# Patient Record
Sex: Male | Born: 1965 | Race: Black or African American | Hispanic: No | Marital: Single | Smoking: Current every day smoker
Health system: Southern US, Community
[De-identification: ages and names within clinical notes are randomized; demographics above are authoritative.]

## PROBLEM LIST (undated history)

## (undated) ENCOUNTER — Emergency Department (HOSPITAL_COMMUNITY): Admission: EM | Payer: Medicaid Other | Source: Home / Self Care

## (undated) DIAGNOSIS — K219 Gastro-esophageal reflux disease without esophagitis: Secondary | ICD-10-CM

## (undated) DIAGNOSIS — E119 Type 2 diabetes mellitus without complications: Secondary | ICD-10-CM

## (undated) DIAGNOSIS — E785 Hyperlipidemia, unspecified: Secondary | ICD-10-CM

## (undated) DIAGNOSIS — I10 Essential (primary) hypertension: Secondary | ICD-10-CM

## (undated) HISTORY — DX: Hyperlipidemia, unspecified: E78.5

## (undated) HISTORY — PX: HIP SURGERY: SHX245

## (undated) HISTORY — DX: Essential (primary) hypertension: I10

## (undated) HISTORY — PX: WRIST SURGERY: SHX841

## (undated) HISTORY — PX: TOOTH EXTRACTION: SUR596

---

## 2000-11-06 HISTORY — PX: ESOPHAGOGASTRODUODENOSCOPY: SHX1529

## 2001-02-18 ENCOUNTER — Emergency Department (HOSPITAL_COMMUNITY): Admission: EM | Admit: 2001-02-18 | Discharge: 2001-02-18 | Payer: Self-pay | Admitting: *Deleted

## 2001-09-08 ENCOUNTER — Emergency Department (HOSPITAL_COMMUNITY): Admission: EM | Admit: 2001-09-08 | Discharge: 2001-09-08 | Payer: Self-pay | Admitting: Emergency Medicine

## 2001-10-21 ENCOUNTER — Inpatient Hospital Stay (HOSPITAL_COMMUNITY): Admission: EM | Admit: 2001-10-21 | Discharge: 2001-10-22 | Payer: Self-pay | Admitting: Emergency Medicine

## 2002-11-12 ENCOUNTER — Encounter: Payer: Self-pay | Admitting: Emergency Medicine

## 2002-11-12 ENCOUNTER — Emergency Department (HOSPITAL_COMMUNITY): Admission: EM | Admit: 2002-11-12 | Discharge: 2002-11-12 | Payer: Self-pay | Admitting: Emergency Medicine

## 2003-01-04 ENCOUNTER — Emergency Department (HOSPITAL_COMMUNITY): Admission: EM | Admit: 2003-01-04 | Discharge: 2003-01-04 | Payer: Self-pay | Admitting: Internal Medicine

## 2003-05-07 ENCOUNTER — Emergency Department (HOSPITAL_COMMUNITY): Admission: EM | Admit: 2003-05-07 | Discharge: 2003-05-07 | Payer: Self-pay | Admitting: *Deleted

## 2004-03-30 ENCOUNTER — Emergency Department (HOSPITAL_COMMUNITY): Admission: EM | Admit: 2004-03-30 | Discharge: 2004-03-31 | Payer: Self-pay | Admitting: *Deleted

## 2004-08-23 ENCOUNTER — Emergency Department (HOSPITAL_COMMUNITY): Admission: EM | Admit: 2004-08-23 | Discharge: 2004-08-23 | Payer: Self-pay

## 2005-01-16 ENCOUNTER — Emergency Department (HOSPITAL_COMMUNITY): Admission: EM | Admit: 2005-01-16 | Discharge: 2005-01-16 | Payer: Self-pay | Admitting: *Deleted

## 2005-06-28 ENCOUNTER — Inpatient Hospital Stay (HOSPITAL_COMMUNITY): Admission: EM | Admit: 2005-06-28 | Discharge: 2005-06-29 | Payer: Self-pay | Admitting: Emergency Medicine

## 2005-07-06 ENCOUNTER — Emergency Department (HOSPITAL_COMMUNITY): Admission: EM | Admit: 2005-07-06 | Discharge: 2005-07-06 | Payer: Self-pay | Admitting: *Deleted

## 2006-03-09 ENCOUNTER — Emergency Department (HOSPITAL_COMMUNITY): Admission: EM | Admit: 2006-03-09 | Discharge: 2006-03-09 | Payer: Self-pay | Admitting: Emergency Medicine

## 2011-03-24 NOTE — Op Note (Signed)
[facility]  Patient:    David Knox, David Knox Visit Number: [id number] MRN: [id number]          Service Type: MED Location: 2A A216 01 Attending Physician:  Herbert Seta Dictated by:   Roetta Sessions, M.D. Proc. Date: 10/21/01 Admit Date:  10/20/2001   CC:         Kari Baars, M.D.   Operative Report  PROCEDURE:  Diagnostic esophagogastroduodenoscopy.  ENDOSCOPIST:  Roetta Sessions, M.D.  INDICATION FOR PROCEDURE:  Patient is a 45 year old gentleman being admitted to the hospital with nausea and hematemesis.  Esophagogastroduodenoscopy is now being done to further evaluate his symptoms. This approach has been discussed with David Knox previously.  Potential risks, benefits and alternatives have been reviewed and questions answered; he is agreeable.  Please see the documentation in the chart.  I feel he is low risk for conscious sedation.  DESCRIPTION OF PROCEDURE:  Patient was placed in the left lateral decubitus position.  O2 saturation, blood pressure, pulse and respirations were monitored throughout the entire procedure.  Conscious sedation:  Versed 3 mg IV in divided doses, Demerol 75 mg IV in divided doses.  INSTRUMENT:  Olympus video chip diagnostic gastroscope.  FINDINGS:  Examination of the tubular esophagus revealed three long columns of linear deep erosions extending from the EG junction up involving the distal one-third of the tubular esophagus.  There were superimposed ulcers distally on these erosions.  The EG junction was quite patulous.  There were no esophageal varices and no Barretts esophagus.  EG junction was easily traversed.  Stomach:  The gastric cavity was emptied and insufflated well with air. Thorough examination of the gastric mucosa including a retroflexed view of the proximal stomach and esophagogastric junction demonstrated only a moderate-sized hiatal hernia.  Pyloric channel was patent and  easily traversed.  Duodenum:  Bulb and second portion appeared normal.  THERAPEUTIC/DIAGNOSTIC MANEUVERS PERFORMED:  None.  The patient tolerated the procedure well and was reactive at endoscopy.  IMPRESSION: 1. Multiple deep, long, linear erosions with superimposed ulcerations    consistent with moderately severe erosive/ulcerative reflux esophagitis. 2. Patulous esophagogastric junction. 3. Moderate-sized hiatal hernia. 4. Remainder of upper gastrointestinal tract appeared normal. 5. I suspect the patient has bled from esophagitis.  RECOMMENDATIONS: 1. Agree with Protonix.  Would continue him on that medication indefinitely. 2. Hopefully, he can stop drinking and may not need aggressive acid    suppression in the future but for the time-being, he will need to be    treated aggressively. 3. Carafate 1 g slurries orally q.i.d. x 5 days. 4. Low-fat diet. 5. Follow up on pending amylase and lipase. Dictated by:   Roetta Sessions, M.D. Attending Physician:  Herbert Seta DD:  10/21/01 TD:  10/21/01 Job: [id number]

## 2011-03-24 NOTE — Discharge Summary (Signed)
[facility]  Patient:    David Knox, David Knox Visit Number: [id number] MRN: [id number]          Service Type: MED Location: 2A A216 01 Attending Physician:  Fredirick Maudlin Dictated by:   Kari Baars, M.D. Admit Date:  10/20/2001 Discharge Date: 10/22/2001                             Discharge Summary  FINAL DISCHARGE DIAGNOSES: 1. Erosive reflux esophagitis. 2. Gastrointestinal bleeding secondary to #1.  BRIEF HISTORY:  This is a 45 year old who had been in his usual state of fairly good health at home when he developed abdominal pain, then he vomited, then he vomited up blood.  When he came to the emergency room he was noted to have hematemesis.  He had drank some alcohol prior to admission and his alcohol level was slightly elevated also.  PHYSICAL EXAMINATION:  GENERAL:  Showed that he was a well-developed well-nourished male, who did not appear to be in any acute distress.  CHEST:  His chest was fairly clear.  HEART:  Regular.  ABDOMEN:  Soft without masses.  EXTREMITIES:  No edema.  CNS:  Grossly intact.  HOSPITAL COURSE:  He had serial hemoglobins and hematocrits but did not have a significant drop and therefore required no blood.  He had GI consultation and underwent EGD which showed erosive esophagitis which was felt to be the cause of his bleeding.  He was treated with protonix and improved and said that he felt well by the time of discharge and was discharged home in improved condition to take Carafate liquid 4 times a day and a protonix 40 mg daily.  A multiple vitamin with iron daily and then follow up in my office in about 6 weeks. Dictated by:   Kari Baars, M.D. Attending Physician:  Fredirick Maudlin DD:  11/11/01 TD:  11/12/01 Job: [id number]

## 2011-03-24 NOTE — Group Therapy Note (Signed)
[facility]  Patient:    David Knox, David Knox Visit Number: [id number] MRN: [id number]          Service Type: MED Location: 2A A216 01 Attending Physician:  Fredirick Maudlin Dictated by:   Kari Baars, M.D. Proc. Date: 10/22/01 Admit Date:  10/20/2001 Discharge Date: 10/22/2001                               Progress Note  PROBLEM 1. Abdominal pain. 2. Gastrointestinal bleeding.  SUBJECTIVE:  David Knox says he feels well and has no complaints.  He has had no further nausea or vomiting.  OBJECTIVE:  His physical exam shows that his abdomen is soft.  Vital signs: Blood pressure 120/70.  LABORATORY DATA:  The hemoglobin level is about 12.3.  Amylase and lipase are both normal.  ASSESSMENT:  He is much improved.  PLAN:  For discharge today.  Please see the discharge summary for details. Dictated by:   Kari Baars, M.D. Attending Physician:  Fredirick Maudlin DD:  10/22/01 TD:  10/22/01 Job: [id number]
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
## 2011-03-24 NOTE — H&P (Signed)
[facility]  Patient:    David Knox, David Knox Visit Number: [id number] MRN: [id number]          Service Type: MED Location: 2A A216 01 Attending Physician:  Herbert Seta Dictated by:   Kari Baars, M.D. Admit Date:  10/20/2001                           History and Physical  PROBLEM:  GI bleeding.  SUBJECTIVE:  David Knox is a 45 year old who came to the emergency room after having had abdominal pain and vomiting.  He started vomiting up blood.  He came to the emergency room after that because of concerns about the vomiting of blood.  He was treated in the emergency room and found what appeared to be ______, but because of the hematemesis was admitted to the hospital for further evaluation.  His hemoglobin level has dropped this morning from 14.9 to about 13.4.  He had consumed alcohol prior to coming in and his alcohol level was 0.9.  His comprehensive metabolic profile showed normal liver enzymes.  He says that he has had previous episodes of some abdominal discomfort.  He has vomited in the past with some blood.  He has been having a lot of heartburn recently.  PAST MEDICAL HISTORY:  Otherwise essentially negative.  MEDICATIONS:  None.  PAST SURGICAL HISTORY:  None.  SOCIAL HISTORY:  He drinks alcohol on occasion.  He lives at home with his wife.  REVIEW OF SYSTEMS:  Except as mentioned.  He has not noticed any black or discolored stools.  He has not had any diarrhea.  He has had no other symptoms.  No weight loss.  PHYSICAL EXAMINATION  GENERAL:  Well-developed, well-nourished who does not appear to be in any acute distress now.  He says he is thirsty.  VITAL SIGNS:  Blood pressure 120/82, pulse 80 and regular, respirations 16.  HEENT:  Pupils are equal, round and reactive to light and accommodation.  Nose and throat are clear.  NECK:  Supple without masses.  CHEST:  Clear without wheezes, rales, or rhonchi.  HEART:   Regular without murmur, gallop, or rubs.  ABDOMEN:  Soft.  No tenderness.  EXTREMITIES:  No edema.  CNS:  Grossly intact.  ASSESSMENT:  He has a GI bleed.  PLAN:  GI consultation.  He is on Protonix.  We are going to check CBCs. Dictated by:   Kari Baars, M.D. Attending Physician:  Herbert Seta DD:  10/21/01 TD:  10/21/01 Job: [id number]

## 2012-07-28 ENCOUNTER — Emergency Department (HOSPITAL_COMMUNITY)
Admission: EM | Admit: 2012-07-28 | Discharge: 2012-07-28 | Disposition: A | Discharge status: Home / Self Care | Payer: Self-pay | Attending: Emergency Medicine | Admitting: Emergency Medicine

## 2012-07-28 ENCOUNTER — Emergency Department (HOSPITAL_COMMUNITY): Payer: Self-pay

## 2012-07-28 ENCOUNTER — Encounter (HOSPITAL_COMMUNITY): Payer: Self-pay

## 2012-07-28 DIAGNOSIS — S02402A Zygomatic fracture, unspecified, initial encounter for closed fracture: Secondary | ICD-10-CM

## 2012-07-28 DIAGNOSIS — S02401A Maxillary fracture, unspecified, initial encounter for closed fracture: Secondary | ICD-10-CM | POA: Insufficient documentation

## 2012-07-28 DIAGNOSIS — S41109A Unspecified open wound of unspecified upper arm, initial encounter: Secondary | ICD-10-CM | POA: Insufficient documentation

## 2012-07-28 DIAGNOSIS — S02400A Malar fracture unspecified, initial encounter for closed fracture: Secondary | ICD-10-CM | POA: Insufficient documentation

## 2012-07-28 DIAGNOSIS — T07XXXA Unspecified multiple injuries, initial encounter: Secondary | ICD-10-CM | POA: Insufficient documentation

## 2012-07-28 DIAGNOSIS — S41119A Laceration without foreign body of unspecified upper arm, initial encounter: Secondary | ICD-10-CM

## 2012-07-28 DIAGNOSIS — R51 Headache: Secondary | ICD-10-CM | POA: Insufficient documentation

## 2012-07-28 LAB — BASIC METABOLIC PANEL
BUN: 8 mg/dL (ref 6–23)
CO2: 22 mEq/L (ref 19–32)
Calcium: 9 mg/dL (ref 8.4–10.5)
Chloride: 106 mEq/L (ref 96–112)
Creatinine, Ser: 1.07 mg/dL (ref 0.50–1.35)
GFR calc Af Amer: >90 mL/min (ref >90)
GFR calc non Af Amer: 83 mL/min — ABNORMAL LOW (ref >90)
Glucose, Bld: 131 mg/dL — ABNORMAL HIGH (ref 70–99)
Potassium: 3.5 mEq/L (ref 3.5–5.1)
Sodium: 143 mEq/L (ref 135–145)

## 2012-07-28 LAB — CBC
HCT: 41.4 % (ref 39.0–52.0)
Hemoglobin: 14.4 g/dL (ref 13.0–17.0)
MCH: 32.2 pg (ref 26.0–34.0)
MCHC: 34.8 g/dL (ref 30.0–36.0)
MCV: 92.6 fL (ref 78.0–100.0)
Platelets: 254 10*3/uL (ref 150–400)
RBC: 4.47 MIL/uL (ref 4.22–5.81)
RDW: 12.7 % (ref 11.5–15.5)
WBC: 11.1 10*3/uL — ABNORMAL HIGH (ref 4.0–10.5)

## 2012-07-28 LAB — RAPID URINE DRUG SCREEN, HOSP PERFORMED
Amphetamines: NOT DETECTED
Barbiturates: NOT DETECTED
Benzodiazepines: NOT DETECTED
Cocaine: POSITIVE — AB
Opiates: NOT DETECTED
Tetrahydrocannabinol: NOT DETECTED

## 2012-07-28 LAB — ETHANOL: Alcohol, Ethyl (B): 229 mg/dL — ABNORMAL HIGH (ref 0–11)

## 2012-07-28 MED ORDER — ONDANSETRON HCL 4 MG/2ML IJ SOLN
4.0000 mg | Freq: Once | INTRAMUSCULAR | Status: AC
  Administered 2012-07-28: 4 mg via INTRAVENOUS
  Filled 2012-07-28: qty 2

## 2012-07-28 MED ORDER — HYDROCODONE-ACETAMINOPHEN 5-325 MG PO TABS: 1.0000 | ORAL_TABLET | ORAL | Status: AC | PRN

## 2012-07-28 MED ORDER — LIDOCAINE HCL (PF) 1 % IJ SOLN
INTRAMUSCULAR | Status: AC
  Administered 2012-07-28: 04:00:00
  Filled 2012-07-28: qty 5

## 2012-07-28 MED ORDER — IBUPROFEN 800 MG PO TABS
800.0000 mg | ORAL_TABLET | Freq: Once | ORAL | Status: AC
  Administered 2012-07-28: 800 mg via ORAL
  Filled 2012-07-28: qty 1
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
## 2012-07-28 NOTE — ED Notes (Signed)
Wound cleaned and left for assessment by EDP.  Bleeding remains controlled.

## 2012-07-28 NOTE — ED Notes (Signed)
Pt reporting headache.  Medicated with ibuprofen and ice pack provided.
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
## 2012-07-28 NOTE — ED Notes (Signed)
Pt c/o nausea, vomited small amount of emesis.  Zofran given.  Wound on arm cleaned and dressed.
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
## 2012-08-09 ENCOUNTER — Encounter (HOSPITAL_COMMUNITY): Payer: Self-pay | Admitting: Emergency Medicine

## 2012-08-09 ENCOUNTER — Emergency Department (HOSPITAL_COMMUNITY)
Admission: EM | Admit: 2012-08-09 | Discharge: 2012-08-09 | Disposition: A | Discharge status: Home / Self Care | Payer: Self-pay | Attending: Emergency Medicine | Admitting: Emergency Medicine

## 2012-08-09 DIAGNOSIS — F17200 Nicotine dependence, unspecified, uncomplicated: Secondary | ICD-10-CM | POA: Insufficient documentation

## 2012-08-09 DIAGNOSIS — Z4802 Encounter for removal of sutures: Secondary | ICD-10-CM | POA: Insufficient documentation

## 2012-08-09 NOTE — ED Provider Notes (Signed)
History     CSN: [id number]  Arrival date & time 08/09/12  1010   First MD Initiated Contact with Patient 08/09/12 1110      Chief Complaint  Patient presents with  . Suture / Staple Removal    (Consider location/radiation/quality/duration/timing/severity/associated sxs/prior treatment) Patient is a 46 y.o. male presenting with suture removal. The history is provided by the patient.  Suture / Staple Removal  The sutures were placed 11 to 14 days ago. There has been no treatment since the wound repair. Fever duration: none. There has been no drainage from the wound. There is no redness present. There is no swelling present. The pain has improved. He has no difficulty moving the affected extremity or digit.    History reviewed. No pertinent past medical history.  Past Surgical History  Procedure Date  . Hip surgery     History reviewed. No pertinent family history.  History  Substance Use Topics  . Smoking status: Current Every Day Smoker    Types: Cigarettes  . Smokeless tobacco: Not on file  . Alcohol Use: Yes     beer and liquor      Review of Systems  Constitutional: Negative for activity change.       All ROS Neg except as noted in HPI  HENT: Negative for nosebleeds and neck pain.   Eyes: Negative for photophobia and discharge.  Respiratory: Negative for cough, shortness of breath and wheezing.   Cardiovascular: Negative for chest pain and palpitations.  Gastrointestinal: Negative for abdominal pain and blood in stool.  Genitourinary: Negative for dysuria, frequency and hematuria.  Musculoskeletal: Positive for arthralgias. Negative for back pain.  Skin: Negative.   Neurological: Negative for dizziness, seizures and speech difficulty.  Psychiatric/Behavioral: Negative for hallucinations and confusion.    Allergies  Review of patient's allergies indicates no known allergies.  Home Medications  No current outpatient prescriptions on file.  BP 115/68   Pulse 52  Temp 97.8 F (36.6 C)  Resp 16  Ht 5' 9.5" (1.765 m)  Wt 170 lb (77.111 kg)  BMI 24.74 kg/m2  SpO2 100%  Physical Exam  Nursing note and vitals reviewed. Constitutional: He is oriented to person, place, and time. He appears well-developed and well-nourished.  Non-toxic appearance.  HENT:  Head: Normocephalic.  Right Ear: Tympanic membrane and external ear normal.  Left Ear: Tympanic membrane and external ear normal.  Eyes: EOM and lids are normal. Pupils are equal, round, and reactive to light.  Neck: Normal range of motion. Neck supple. Carotid bruit is not present.  Cardiovascular: Normal rate, regular rhythm, normal heart sounds, intact distal pulses and normal pulses.   Pulmonary/Chest: Breath sounds normal. No respiratory distress.  Abdominal: Soft. Bowel sounds are normal. There is no tenderness. There is no guarding.  Musculoskeletal: Normal range of motion.       Stapled wound to the left inner tricep area is healing nicely. FROM of the left upper ext. Pulses symmetrical. No sensory changes. Grip symmetrical.  Lymphadenopathy:       Head (right side): No submandibular adenopathy present.       Head (left side): No submandibular adenopathy present.    He has no cervical adenopathy.  Neurological: He is alert and oriented to person, place, and time. He has normal strength. No cranial nerve deficit or sensory deficit.  Skin: Skin is warm and dry.  Psychiatric: He has a normal mood and affect. His speech is normal.    ED Course  Procedures (  including critical care time)  Labs Reviewed - No data to display No results found.   1. Removal of staples       MDM  I have reviewed nursing notes, vital signs, and all appropriate lab and imaging results for this patient. Stapled wound healing nicely. Neurovascularly intact. Staple removed by nursing staff. Pt to return if any changes or problem.       Kathie Dike, [place] 08/09/12 1157

## 2012-08-09 NOTE — ED Notes (Signed)
H. Bryant, PA at bedside. 

## 2012-08-09 NOTE — ED Notes (Signed)
Pt's staples(x3) removed from left axillary, site WNL

## 2012-08-09 NOTE — ED Notes (Signed)
Here for staple removal

## 2012-08-09 NOTE — ED Provider Notes (Signed)
Medical screening examination/treatment/procedure(s) were performed by non-physician practitioner and as supervising physician I was immediately available for consultation/collaboration.  Roni Friberg L Klein Willcox, MD 08/09/12 1638 

## 2013-02-07 ENCOUNTER — Encounter (HOSPITAL_COMMUNITY): Payer: Self-pay | Admitting: Emergency Medicine

## 2013-02-07 ENCOUNTER — Emergency Department (HOSPITAL_COMMUNITY)
Admission: EM | Admit: 2013-02-07 | Discharge: 2013-02-07 | Disposition: A | Discharge status: Home / Self Care | Payer: Self-pay | Attending: Emergency Medicine | Admitting: Emergency Medicine

## 2013-02-07 DIAGNOSIS — S30860A Insect bite (nonvenomous) of lower back and pelvis, initial encounter: Secondary | ICD-10-CM | POA: Insufficient documentation

## 2013-02-07 DIAGNOSIS — W57XXXA Bitten or stung by nonvenomous insect and other nonvenomous arthropods, initial encounter: Secondary | ICD-10-CM | POA: Insufficient documentation

## 2013-02-07 DIAGNOSIS — F17200 Nicotine dependence, unspecified, uncomplicated: Secondary | ICD-10-CM | POA: Insufficient documentation

## 2013-02-07 DIAGNOSIS — Y929 Unspecified place or not applicable: Secondary | ICD-10-CM | POA: Insufficient documentation

## 2013-02-07 DIAGNOSIS — Y939 Activity, unspecified: Secondary | ICD-10-CM | POA: Insufficient documentation

## 2013-02-07 NOTE — ED Provider Notes (Signed)
History     CSN: [id number]  Arrival date & time 02/07/13  0129   First MD Initiated Contact with Patient 02/07/13 0308      Chief Complaint  Patient presents with  . Tick Removal    (Consider location/radiation/quality/duration/timing/severity/associated sxs/prior treatment) HPI David Knox is a 47 y.o. male who presents to the Emergency Department complaining of tick bite to back. Unable to remove it at home.   History reviewed. No pertinent past medical history.  Past Surgical History  Procedure Laterality Date  . Hip surgery      No family history on file.  History  Substance Use Topics  . Smoking status: Current Every Day Smoker    Types: Cigarettes  . Smokeless tobacco: Not on file  . Alcohol Use: Yes     Comment: beer and liquor      Review of Systems  Constitutional: Negative for fever.       10 Systems reviewed and are negative for acute change except as noted in the HPI.  HENT: Negative for congestion.   Eyes: Negative for discharge and redness.  Respiratory: Negative for cough and shortness of breath.   Cardiovascular: Negative for chest pain.  Gastrointestinal: Negative for vomiting and abdominal pain.  Musculoskeletal: Negative for back pain.  Skin: Negative for rash.  Neurological: Negative for syncope, numbness and headaches.  Psychiatric/Behavioral:       No behavior change.    Allergies  Review of patient's allergies indicates no known allergies.  Home Medications  No current outpatient prescriptions on file.  BP 121/82  Pulse 89  Temp(Src) 98.3 F (36.8 C) (Oral)  Resp 18  Ht 5\' 10"  (1.778 m)  Wt 172 lb (78.019 kg)  BMI 24.68 kg/m2  SpO2 96%  Physical Exam  Nursing note and vitals reviewed. Constitutional: He appears well-developed and well-nourished.  Awake, alert, nontoxic appearance.  HENT:  Head: Normocephalic and atraumatic.  Eyes: Pupils are equal, round, and reactive to light.  Neck: Neck supple.   Cardiovascular: Normal rate and intact distal pulses.   Pulmonary/Chest: Effort normal and breath sounds normal. He exhibits no tenderness.  Abdominal: Soft. Bowel sounds are normal. There is no tenderness. There is no rebound.  Musculoskeletal: He exhibits no tenderness.  Baseline ROM, no obvious new focal weakness.  Neurological:  Mental status and motor strength appears baseline for patient and situation.  Skin: No rash noted.  Small wood tick on back.  Psychiatric: He has a normal mood and affect.    ED Course  Procedures (including critical care time)  Applied: alcohol Using: tweezers Removed tick Patient tolerated procedure well  MDM  Patient with tick on his back. Removed tick. Pt stable in ED with no significant deterioration in condition.The patient appears reasonably screened and/or stabilized for discharge and I doubt any other medical condition or other [facility] requiring further screening, evaluation, or treatment in the ED at this time prior to discharge.  MDM Reviewed: nursing note and vitals           Nicoletta Dress. Colon Branch, MD 02/07/13 [id number]

## 2013-02-07 NOTE — ED Notes (Signed)
Patient presents to ER to have tick removed from middle of his back.

## 2013-08-28 ENCOUNTER — Encounter (HOSPITAL_COMMUNITY): Payer: Self-pay | Admitting: Emergency Medicine

## 2013-08-28 ENCOUNTER — Emergency Department (HOSPITAL_COMMUNITY)
Admission: EM | Admit: 2013-08-28 | Discharge: 2013-08-28 | Disposition: A | Discharge status: Home / Self Care | Payer: Self-pay | Attending: Emergency Medicine | Admitting: Emergency Medicine

## 2013-08-28 ENCOUNTER — Emergency Department (HOSPITAL_COMMUNITY): Payer: Self-pay

## 2013-08-28 DIAGNOSIS — F17200 Nicotine dependence, unspecified, uncomplicated: Secondary | ICD-10-CM | POA: Insufficient documentation

## 2013-08-28 DIAGNOSIS — J3489 Other specified disorders of nose and nasal sinuses: Secondary | ICD-10-CM | POA: Insufficient documentation

## 2013-08-28 DIAGNOSIS — R1110 Vomiting, unspecified: Secondary | ICD-10-CM

## 2013-08-28 DIAGNOSIS — R142 Eructation: Secondary | ICD-10-CM | POA: Insufficient documentation

## 2013-08-28 DIAGNOSIS — K219 Gastro-esophageal reflux disease without esophagitis: Secondary | ICD-10-CM | POA: Insufficient documentation

## 2013-08-28 DIAGNOSIS — Z79899 Other long term (current) drug therapy: Secondary | ICD-10-CM | POA: Insufficient documentation

## 2013-08-28 DIAGNOSIS — R112 Nausea with vomiting, unspecified: Secondary | ICD-10-CM | POA: Insufficient documentation

## 2013-08-28 DIAGNOSIS — J029 Acute pharyngitis, unspecified: Secondary | ICD-10-CM | POA: Insufficient documentation

## 2013-08-28 DIAGNOSIS — R141 Gas pain: Secondary | ICD-10-CM | POA: Insufficient documentation

## 2013-08-28 HISTORY — DX: Gastro-esophageal reflux disease without esophagitis: K21.9

## 2013-08-28 LAB — CBC
HCT: 43.2 % (ref 39.0–52.0)
Hemoglobin: 14.8 g/dL (ref 13.0–17.0)
MCH: 31.4 pg (ref 26.0–34.0)
MCHC: 34.3 g/dL (ref 30.0–36.0)
MCV: 91.7 fL (ref 78.0–100.0)
Platelets: 229 10*3/uL (ref 150–400)
RBC: 4.71 MIL/uL (ref 4.22–5.81)
RDW: 13 % (ref 11.5–15.5)
WBC: 6.5 10*3/uL (ref 4.0–10.5)

## 2013-08-28 LAB — HEPATIC FUNCTION PANEL
ALT: 18 U/L (ref 0–53)
Albumin: 4.2 g/dL (ref 3.5–5.2)
Alkaline Phosphatase: 65 U/L (ref 39–117)
Bilirubin, Direct: <0.1 mg/dL (ref 0.0–0.3)
Total Bilirubin: 0.4 mg/dL (ref 0.3–1.2)
Total Protein: 8.1 g/dL (ref 6.0–8.3)

## 2013-08-28 LAB — BASIC METABOLIC PANEL
BUN: 11 mg/dL (ref 6–23)
CO2: 30 mEq/L (ref 19–32)
Calcium: 10.2 mg/dL (ref 8.4–10.5)
Chloride: 99 mEq/L (ref 96–112)
Creatinine, Ser: 1.15 mg/dL (ref 0.50–1.35)
GFR calc Af Amer: 87 mL/min — ABNORMAL LOW (ref >90)
GFR calc non Af Amer: 75 mL/min — ABNORMAL LOW (ref >90)
Glucose, Bld: 104 mg/dL — ABNORMAL HIGH (ref 70–99)
Potassium: 3.8 mEq/L (ref 3.5–5.1)
Sodium: 138 mEq/L (ref 135–145)

## 2013-08-28 LAB — LIPASE, BLOOD: Lipase: 37 U/L (ref 11–59)

## 2013-08-28 LAB — RAPID STREP SCREEN (MED CTR MEBANE ONLY): Streptococcus, Group A Screen (Direct): NEGATIVE

## 2013-08-28 MED ORDER — ONDANSETRON HCL 4 MG/2ML IJ SOLN
4.0000 mg | Freq: Once | INTRAMUSCULAR | Status: AC
  Administered 2013-08-28: 4 mg via INTRAVENOUS
  Filled 2013-08-28: qty 2

## 2013-08-28 MED ORDER — SODIUM CHLORIDE 0.9 % IV BOLUS (SEPSIS)
1000.0000 mL | Freq: Once | INTRAVENOUS | Status: AC
  Administered 2013-08-28: 1000 mL via INTRAVENOUS

## 2013-08-28 MED ORDER — PANTOPRAZOLE SODIUM 40 MG PO TBEC
40.0000 mg | DELAYED_RELEASE_TABLET | Freq: Once | ORAL | Status: AC
  Administered 2013-08-28: 40 mg via ORAL
  Filled 2013-08-28: qty 1

## 2013-08-28 MED ORDER — PROMETHAZINE HCL 25 MG PO TABS: 25.0000 mg | ORAL_TABLET | Freq: Four times a day (QID) | ORAL | Status: DC | PRN

## 2013-08-28 MED ORDER — ONDANSETRON 4 MG PO TBDP: 4.0000 mg | ORAL_TABLET | Freq: Three times a day (TID) | ORAL | Status: DC | PRN

## 2013-08-28 MED ORDER — SODIUM CHLORIDE 0.9 % IV SOLN: INTRAVENOUS | Status: DC

## 2013-08-28 NOTE — ED Notes (Signed)
Nausea and vomiting 

## 2013-08-28 NOTE — ED Provider Notes (Signed)
CSN: [id number]     Arrival date & time 08/28/13  1538 History   First MD Initiated Contact with Patient 08/28/13 1802     Chief Complaint  Patient presents with  . Emesis   (Consider location/radiation/quality/duration/timing/severity/associated sxs/prior Treatment) Patient is a 47 y.o. male presenting with vomiting. The history is provided by the patient and the spouse.  Emesis Associated symptoms: sore throat   Associated symptoms: no abdominal pain, no diarrhea and no headaches    patient with onset of multiple episodes of vomiting at midnight. Has had 4 episodes of vomiting today. Patient was exposed strep throat to his wife last week. Patient did have upper respiratory symptoms with the congestion and sore throat over the weekend. Patient has no abdominal pain feels as if there's tightness in the abdomen. No fevers no syncope no blood in the bowel movements. No blood in his vomit however the vomit was brown in color.  Patient does have a history of some reflux problems and does take over-the-counter Zantac intermittently.  Past Medical History  Diagnosis Date  . Acid reflux    Past Surgical History  Procedure Laterality Date  . Hip surgery     No family history on file. History  Substance Use Topics  . Smoking status: Current Every Day Smoker    Types: Cigarettes  . Smokeless tobacco: Not on file  . Alcohol Use: Yes     Comment: beer and liquor    Review of Systems  Constitutional: Negative for fever.  HENT: Positive for congestion and sore throat. Negative for trouble swallowing.   Eyes: Negative for redness.  Gastrointestinal: Positive for nausea, vomiting and abdominal distention. Negative for abdominal pain, diarrhea and blood in stool.  Genitourinary: Negative for hematuria and flank pain.  Musculoskeletal: Negative for back pain.  Skin: Negative for rash.  Neurological: Negative for headaches.  Hematological: Does not bruise/bleed easily.   Psychiatric/Behavioral: Negative for confusion.    Allergies  Review of patient's allergies indicates no known allergies.  Home Medications   Current Outpatient Rx  Name  Route  Sig  Dispense  Refill  . Cimetidine (ACID REDUCER PO)   Oral   Take 1 tablet by mouth 2 (two) times daily. OTC         . ibuprofen (ADVIL,MOTRIN) 200 MG tablet   Oral   Take 400 mg by mouth every 6 (six) hours as needed for pain.         Marland Kitchen ondansetron (ZOFRAN ODT) 4 MG disintegrating tablet   Oral   Take 1 tablet (4 mg total) by mouth every 8 (eight) hours as needed.   10 tablet   0   . promethazine (PHENERGAN) 25 MG tablet   Oral   Take 1 tablet (25 mg total) by mouth every 6 (six) hours as needed for nausea.   12 tablet   0    BP 123/88  Pulse 74  Temp(Src) 98.1 F (36.7 C) (Oral)  Resp 16  Ht 5\' 10"  (1.778 m)  Wt 175 lb (79.379 kg)  BMI 25.11 kg/m2  SpO2 98% Physical Exam  Nursing note and vitals reviewed. Constitutional: He is oriented to person, place, and time. He appears well-developed and well-nourished. No distress.  HENT:  Head: Normocephalic and atraumatic.  Mouth/Throat: Oropharynx is clear and moist.  Eyes: Conjunctivae and EOM are normal. Pupils are equal, round, and reactive to light.  Neck: Normal range of motion. Neck supple.  Cardiovascular: Normal rate and normal heart sounds.  Pulmonary/Chest: Effort normal and breath sounds normal. No respiratory distress.  Abdominal: Soft. Bowel sounds are normal. He exhibits no distension. There is no tenderness.  Neurological: He is alert and oriented to person, place, and time. No cranial nerve deficit. He exhibits normal muscle tone. Coordination normal.  Skin: Skin is warm. No rash noted.    ED Course  Procedures (including critical care time) Labs Review Labs Reviewed  BASIC METABOLIC PANEL - Abnormal; Notable for the following:    Glucose, Bld 104 (*)    GFR calc non Af Amer 75 (*)    GFR calc Af Amer 87 (*)     All other components within normal limits  RAPID STREP SCREEN  CULTURE, GROUP A STREP  CBC  HEPATIC FUNCTION PANEL  LIPASE, BLOOD   Results for orders placed during the hospital encounter of 08/28/13  RAPID STREP SCREEN      Result Value Range   Streptococcus, Group A Screen (Direct) NEGATIVE  NEGATIVE  CBC      Result Value Range   WBC 6.5  4.0 - 10.5 K/uL   RBC 4.71  4.22 - 5.81 MIL/uL   Hemoglobin 14.8  13.0 - 17.0 g/dL   HCT 16.1  09.6 - 04.5 %   MCV 91.7  78.0 - 100.0 fL   MCH 31.4  26.0 - 34.0 pg   MCHC 34.3  30.0 - 36.0 g/dL   RDW 40.9  81.1 - 91.4 %   Platelets 229  150 - 400 K/uL  BASIC METABOLIC PANEL      Result Value Range   Sodium 138  135 - 145 mEq/L   Potassium 3.8  3.5 - 5.1 mEq/L   Chloride 99  96 - 112 mEq/L   CO2 30  19 - 32 mEq/L   Glucose, Bld 104 (*) 70 - 99 mg/dL   BUN 11  6 - 23 mg/dL   Creatinine, Ser 7.82  0.50 - 1.35 mg/dL   Calcium 95.6  8.4 - 21.3 mg/dL   GFR calc non Af Amer 75 (*) >90 mL/min   GFR calc Af Amer 87 (*) >90 mL/min  HEPATIC FUNCTION PANEL      Result Value Range   Total Protein 8.1  6.0 - 8.3 g/dL   Albumin 4.2  3.5 - 5.2 g/dL   AST 26  0 - 37 U/L   ALT 18  0 - 53 U/L   Alkaline Phosphatase 65  39 - 117 U/L   Total Bilirubin 0.4  0.3 - 1.2 mg/dL   Bilirubin, Direct <0.8  0.0 - 0.3 mg/dL   Indirect Bilirubin NOT CALCULATED  0.3 - 0.9 mg/dL  LIPASE, BLOOD      Result Value Range   Lipase 37  11 - 59 U/L    Imaging Review Dg Abd Acute W/chest  08/28/2013   CLINICAL DATA:  Vomiting.  EXAM: ACUTE ABDOMEN SERIES (ABDOMEN 2 VIEW & CHEST 1 VIEW)  COMPARISON:  None.  FINDINGS: The upright chest x-ray is normal.  Two views of the abdomen demonstrate an unremarkable bowel gas pattern. There is scattered air and stool in the colon along with scattered radiodense material which could be calcium tablets or Pepto-Bismol. No distended small bowel loops to suggest obstruction. No free air. The soft tissue shadows are maintained. The bony  structures are intact. There are degenerative changes involving both hips.  IMPRESSION: Negative abdominal radiographs.  No acute cardiopulmonary disease.   Electronically Signed   By: Loraine Leriche  Gallerani M.D.   On: 08/28/2013 20:12    EKG Interpretation   None       MDM   1. Vomiting     Patient without any red blood in his vomit. A little bit of a brown-colored. Lab work here is normal. Abdominal series without any significant findings no free air no evidence of pneumonia pneumothorax or pulmonary edema. Patient's hemoglobin and hematocrit are fine liver function tests are fine lipase is not consistent with pancreatitis. No leukocytosis. Will treat with the continued H2 blocker patient currently taking Zantac intermittently he'll take it for the next 7 days. Patient given a dose of her tonics here. Patient will return for vomiting of any red blood. Patient put on 2 anti-medics. Patient has primary care Dr. to followup with if he is not better by Monday. Work note provided.  Patient's rapid strep was negative. Patient's wife had strep last week and he had an upper respiratory infection with pharyngitis the beginning of this week. As stated negative for strep. Oral pharynx on exam was normal no exudate no midline shift of the uvula.  Shelda Jakes, MD 08/28/13 2034

## 2013-08-31 LAB — CULTURE, GROUP A STREP

## 2013-10-10 ENCOUNTER — Other Ambulatory Visit: Payer: Self-pay

## 2013-10-27 ENCOUNTER — Emergency Department (HOSPITAL_COMMUNITY): Payer: Self-pay

## 2013-10-27 ENCOUNTER — Emergency Department (HOSPITAL_COMMUNITY)
Admission: EM | Admit: 2013-10-27 | Discharge: 2013-10-28 | Disposition: A | Discharge status: Home / Self Care | Payer: Self-pay | Attending: Emergency Medicine | Admitting: Emergency Medicine

## 2013-10-27 ENCOUNTER — Encounter (HOSPITAL_COMMUNITY): Payer: Self-pay | Admitting: Emergency Medicine

## 2013-10-27 DIAGNOSIS — Z87828 Personal history of other (healed) physical injury and trauma: Secondary | ICD-10-CM | POA: Insufficient documentation

## 2013-10-27 DIAGNOSIS — R209 Unspecified disturbances of skin sensation: Secondary | ICD-10-CM | POA: Insufficient documentation

## 2013-10-27 DIAGNOSIS — F17200 Nicotine dependence, unspecified, uncomplicated: Secondary | ICD-10-CM | POA: Insufficient documentation

## 2013-10-27 DIAGNOSIS — M25512 Pain in left shoulder: Secondary | ICD-10-CM

## 2013-10-27 DIAGNOSIS — K219 Gastro-esophageal reflux disease without esophagitis: Secondary | ICD-10-CM | POA: Insufficient documentation

## 2013-10-27 DIAGNOSIS — M25519 Pain in unspecified shoulder: Secondary | ICD-10-CM | POA: Insufficient documentation

## 2013-10-27 DIAGNOSIS — Z79899 Other long term (current) drug therapy: Secondary | ICD-10-CM | POA: Insufficient documentation

## 2013-10-27 NOTE — ED Notes (Signed)
Pt reports left shoulder pain for the past 4 days, pt denies any injury or new activity. Pt has good pulses & cap refill.
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
## 2013-10-27 NOTE — ED Notes (Signed)
Pt c/o left shoulder pain x 4 days and denies any injury.

## 2013-10-28 MED ORDER — HYDROCODONE-ACETAMINOPHEN 5-325 MG PO TABS
2.0000 | ORAL_TABLET | Freq: Once | ORAL | Status: AC
  Administered 2013-10-28: 2 via ORAL
  Filled 2013-10-28: qty 2

## 2013-10-28 MED ORDER — HYDROCODONE-ACETAMINOPHEN 5-325 MG PO TABS: 2.0000 | ORAL_TABLET | ORAL | Status: DC | PRN

## 2013-10-28 NOTE — ED Notes (Signed)
Pt alert & oriented x4, stable gait. Patient given discharge instructions, paperwork & prescription(s). Patient  instructed to stop at the registration desk to finish any additional paperwork. Patient verbalized understanding. Pt left department w/ no further questions. 

## 2013-11-20 ENCOUNTER — Ambulatory Visit: Payer: Self-pay | Admitting: Orthopedic Surgery

## 2015-01-03 ENCOUNTER — Encounter (HOSPITAL_COMMUNITY): Payer: Self-pay | Admitting: Emergency Medicine

## 2015-01-03 ENCOUNTER — Emergency Department (HOSPITAL_COMMUNITY)
Admission: EM | Admit: 2015-01-03 | Discharge: 2015-01-03 | Disposition: A | Discharge status: Home / Self Care | Payer: Self-pay | Attending: Emergency Medicine | Admitting: Emergency Medicine

## 2015-01-03 DIAGNOSIS — Z720 Tobacco use: Secondary | ICD-10-CM | POA: Insufficient documentation

## 2015-01-03 DIAGNOSIS — Z79899 Other long term (current) drug therapy: Secondary | ICD-10-CM | POA: Insufficient documentation

## 2015-01-03 DIAGNOSIS — K219 Gastro-esophageal reflux disease without esophagitis: Secondary | ICD-10-CM | POA: Insufficient documentation

## 2015-01-03 DIAGNOSIS — F10120 Alcohol abuse with intoxication, uncomplicated: Secondary | ICD-10-CM | POA: Insufficient documentation

## 2015-01-03 DIAGNOSIS — F10920 Alcohol use, unspecified with intoxication, uncomplicated: Secondary | ICD-10-CM

## 2015-01-03 LAB — COMPREHENSIVE METABOLIC PANEL
ALT: 42 U/L (ref 0–53)
ANION GAP: 5 (ref 5–15)
AST: 32 U/L (ref 0–37)
Albumin: 4.2 g/dL (ref 3.5–5.2)
Alkaline Phosphatase: 60 U/L (ref 39–117)
BILIRUBIN TOTAL: 0.3 mg/dL (ref 0.3–1.2)
BUN: 12 mg/dL (ref 6–23)
CO2: 25 mmol/L (ref 19–32)
CREATININE: 1.21 mg/dL (ref 0.50–1.35)
Calcium: 8.7 mg/dL (ref 8.4–10.5)
Chloride: 112 mmol/L (ref 96–112)
GFR calc Af Amer: 81 mL/min — ABNORMAL LOW (ref >90)
GFR calc non Af Amer: 70 mL/min — ABNORMAL LOW (ref >90)
Glucose, Bld: 130 mg/dL — ABNORMAL HIGH (ref 70–99)
POTASSIUM: 3.8 mmol/L (ref 3.5–5.1)
SODIUM: 142 mmol/L (ref 135–145)
Total Protein: 7.4 g/dL (ref 6.0–8.3)

## 2015-01-03 LAB — CBC
HCT: 44.3 % (ref 39.0–52.0)
Hemoglobin: 14.7 g/dL (ref 13.0–17.0)
MCH: 30.2 pg (ref 26.0–34.0)
MCHC: 33.2 g/dL (ref 30.0–36.0)
MCV: 91 fL (ref 78.0–100.0)
Platelets: 301 10*3/uL (ref 150–400)
RBC: 4.87 MIL/uL (ref 4.22–5.81)
RDW: 13.4 % (ref 11.5–15.5)
WBC: 7.8 10*3/uL (ref 4.0–10.5)

## 2015-01-03 LAB — RAPID URINE DRUG SCREEN, HOSP PERFORMED
Amphetamines: NOT DETECTED
BENZODIAZEPINES: NOT DETECTED
Barbiturates: NOT DETECTED
COCAINE: NOT DETECTED
Opiates: NOT DETECTED
Tetrahydrocannabinol: NOT DETECTED

## 2015-01-03 LAB — ETHANOL: ALCOHOL ETHYL (B): 155 mg/dL — AB (ref 0–9)

## 2015-01-03 LAB — SALICYLATE LEVEL

## 2015-01-03 LAB — ACETAMINOPHEN LEVEL: Acetaminophen (Tylenol), Serum: <10 ug/mL — ABNORMAL LOW (ref 10–30)
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
## 2015-01-03 NOTE — ED Notes (Signed)
Pt wife called to inquire on status of pt. With pt permission, wife informed that IVC paperwork will be rescinded and pt will be d/c'd with transportation home from [facility].

## 2015-01-03 NOTE — Discharge Instructions (Signed)
Your blood sugar was slightly elevated today-130. While not in the range that is considered diabetic, it is what is considered to be pre-diabetes. Please make sure that you're doctor watches your blood sugar closely, since it can easily get worse and become true diabetes.  Alcohol Intoxication Alcohol intoxication occurs when the amount of alcohol that a person has consumed impairs his or her ability to mentally and physically function. Alcohol directly impairs the normal chemical activity of the brain. Drinking large amounts of alcohol can lead to changes in mental function and behavior, and it can cause many physical effects that can be harmful.  Alcohol intoxication can range in severity from mild to very severe. Various factors can affect the level of intoxication that occurs, such as the person's age, gender, weight, frequency of alcohol consumption, and the presence of other medical conditions (such as diabetes, seizures, or heart conditions). Dangerous levels of alcohol intoxication may occur when people drink large amounts of alcohol in a short period (binge drinking). Alcohol can also be especially dangerous when combined with certain prescription medicines or "recreational" drugs. SIGNS AND SYMPTOMS Some common signs and symptoms of mild alcohol intoxication include:  Loss of coordination.  Changes in mood and behavior.  Impaired judgment.  Slurred speech. As alcohol intoxication progresses to more severe levels, other signs and symptoms will appear. These may include:  Vomiting.  Confusion and impaired memory.  Slowed breathing.  Seizures.  Loss of consciousness. DIAGNOSIS  Your health care provider will take a medical history and perform a physical exam. You will be asked about the amount and type of alcohol you have consumed. Blood tests will be done to measure the concentration of alcohol in your blood. In many places, your blood alcohol level must be lower than 80 mg/dL  (1.61%0.08%) to legally drive. However, many dangerous effects of alcohol can occur at much lower levels.  TREATMENT  People with alcohol intoxication often do not require treatment. Most of the effects of alcohol intoxication are temporary, and they go away as the alcohol naturally leaves the body. Your health care provider will monitor your condition until you are stable enough to go home. Fluids are sometimes given through an IV access tube to help prevent dehydration.  HOME CARE INSTRUCTIONS  Do not drive after drinking alcohol.  Stay hydrated. Drink enough water and fluids to keep your urine clear or pale yellow. Avoid caffeine.   Only take over-the-counter or prescription medicines as directed by your health care provider.  SEEK MEDICAL CARE IF:   You have persistent vomiting.   You do not feel better after a few days.  You have frequent alcohol intoxication. Your health care provider can help determine if you should see a substance use treatment counselor. SEEK IMMEDIATE MEDICAL CARE IF:   You become shaky or tremble when you try to stop drinking.   You shake uncontrollably (seizure).   You throw up (vomit) blood. This may be bright red or may look like black coffee grounds.   You have blood in your stool. This may be bright red or may appear as a black, tarry, bad smelling stool.   You become lightheaded or faint.  MAKE SURE YOU:   Understand these instructions.  Will watch your condition.  Will get help right away if you are not doing well or get worse. Document Released: 08/02/2005 Document Revised: 06/25/2013 Document Reviewed: 03/28/2013 [facility] Patient Information 2015 [place], [place]. This information is not intended to replace advice given to  you by your health care provider. Make sure you discuss any questions you have with your health care provider.

## 2015-12-28 ENCOUNTER — Emergency Department (HOSPITAL_COMMUNITY)
Admission: EM | Admit: 2015-12-28 | Discharge: 2015-12-28 | Disposition: A | Discharge status: Home / Self Care | Payer: Self-pay | Attending: Emergency Medicine | Admitting: Emergency Medicine

## 2015-12-28 ENCOUNTER — Emergency Department (HOSPITAL_COMMUNITY): Payer: Self-pay

## 2015-12-28 ENCOUNTER — Encounter (HOSPITAL_COMMUNITY): Payer: Self-pay

## 2015-12-28 DIAGNOSIS — R6889 Other general symptoms and signs: Secondary | ICD-10-CM

## 2015-12-28 DIAGNOSIS — R509 Fever, unspecified: Secondary | ICD-10-CM | POA: Insufficient documentation

## 2015-12-28 DIAGNOSIS — R079 Chest pain, unspecified: Secondary | ICD-10-CM | POA: Insufficient documentation

## 2015-12-28 DIAGNOSIS — J029 Acute pharyngitis, unspecified: Secondary | ICD-10-CM | POA: Insufficient documentation

## 2015-12-28 DIAGNOSIS — R51 Headache: Secondary | ICD-10-CM | POA: Insufficient documentation

## 2015-12-28 DIAGNOSIS — R0981 Nasal congestion: Secondary | ICD-10-CM | POA: Insufficient documentation

## 2015-12-28 DIAGNOSIS — R197 Diarrhea, unspecified: Secondary | ICD-10-CM | POA: Insufficient documentation

## 2015-12-28 DIAGNOSIS — R112 Nausea with vomiting, unspecified: Secondary | ICD-10-CM | POA: Insufficient documentation

## 2015-12-28 DIAGNOSIS — F17210 Nicotine dependence, cigarettes, uncomplicated: Secondary | ICD-10-CM | POA: Insufficient documentation

## 2015-12-28 DIAGNOSIS — R05 Cough: Secondary | ICD-10-CM | POA: Insufficient documentation

## 2015-12-28 DIAGNOSIS — Z79899 Other long term (current) drug therapy: Secondary | ICD-10-CM | POA: Insufficient documentation

## 2015-12-28 DIAGNOSIS — R0602 Shortness of breath: Secondary | ICD-10-CM | POA: Insufficient documentation

## 2015-12-28 DIAGNOSIS — K219 Gastro-esophageal reflux disease without esophagitis: Secondary | ICD-10-CM | POA: Insufficient documentation

## 2015-12-28 LAB — CBC WITH DIFFERENTIAL/PLATELET
BASOS ABS: 0 10*3/uL (ref 0.0–0.1)
Basophils Relative: 0 %
EOS PCT: 1 %
Eosinophils Absolute: 0 10*3/uL (ref 0.0–0.7)
HCT: 42.9 % (ref 39.0–52.0)
HEMOGLOBIN: 14.2 g/dL (ref 13.0–17.0)
LYMPHS PCT: 15 %
Lymphs Abs: 0.6 10*3/uL — ABNORMAL LOW (ref 0.7–4.0)
MCH: 31.8 pg (ref 26.0–34.0)
MCHC: 33.1 g/dL (ref 30.0–36.0)
MCV: 96.2 fL (ref 78.0–100.0)
Monocytes Absolute: 0.6 10*3/uL (ref 0.1–1.0)
Monocytes Relative: 15 %
NEUTROS ABS: 2.8 10*3/uL (ref 1.7–7.7)
NEUTROS PCT: 69 %
PLATELETS: 187 10*3/uL (ref 150–400)
RBC: 4.46 MIL/uL (ref 4.22–5.81)
RDW: 12.4 % (ref 11.5–15.5)
WBC: 4.1 10*3/uL (ref 4.0–10.5)

## 2015-12-28 LAB — BASIC METABOLIC PANEL
Anion gap: 7 (ref 5–15)
BUN: 11 mg/dL (ref 6–20)
CHLORIDE: 103 mmol/L (ref 101–111)
CO2: 28 mmol/L (ref 22–32)
Calcium: 8.9 mg/dL (ref 8.9–10.3)
Creatinine, Ser: 1.23 mg/dL (ref 0.61–1.24)
GFR calc Af Amer: >60 mL/min (ref >60)
GLUCOSE: 99 mg/dL (ref 65–99)
Potassium: 4.3 mmol/L (ref 3.5–5.1)
Sodium: 138 mmol/L (ref 135–145)

## 2015-12-28 MED ORDER — DM-GUAIFENESIN ER 30-600 MG PO TB12: 1.0000 | ORAL_TABLET | Freq: Two times a day (BID) | ORAL | Status: DC

## 2015-12-28 MED ORDER — LOPERAMIDE HCL 2 MG PO TABS: 2.0000 mg | ORAL_TABLET | Freq: Four times a day (QID) | ORAL | Status: DC | PRN

## 2015-12-28 NOTE — Discharge Instructions (Signed)
Taking Mucinex DM for cough and congestion. Take Imodium right ear for the diarrhea. Return for any new or worse symptoms. Chest x-ray negative here today labs without significant abnormalities. Suspect you have a flulike illness. Work note provided.

## 2015-12-28 NOTE — ED Notes (Signed)
Pt reports n/v/d and cough since Saturday night.  No vomiting since Sunday but still having diarrhea.  Also c/o chest feeling sore with movement.

## 2015-12-28 NOTE — ED Provider Notes (Signed)
CSN: [id number]     Arrival date & time 12/28/15  1045 History  By signing my name below, I, Ronney Lion, attest that this documentation has been prepared under the direction and in the presence of Vanetta Mulders, MD. Electronically Signed: Ronney Lion, ED Scribe. 12/28/2015. 12:06 PM.   Chief Complaint  Patient presents with  . Cough  . Diarrhea   Patient is a 50 y.o. male presenting with cough. The history is provided by the patient and medical records. No language interpreter was used.  Cough Severity:  Moderate Onset quality:  Gradual Duration:  3 days Timing:  Constant Progression:  Worsening Chronicity:  New Smoker: yes (per medical records)   Relieved by:  None tried Worsened by:  Nothing tried Ineffective treatments:  None tried Associated symptoms: chest pain (secondary to cough), chills, fever (subjective), headaches, shortness of breath (mild) and sore throat   Associated symptoms: no myalgias and no rash     HPI Comments: David Knox is a 50 y.o. male with a history of GERD, who presents to the Emergency Department complaining of gradual-onset, constant, moderate, nausea, frequent vomiting, watery diarrhea, and cough that began 3 days ago. He also complains of associated chest pain secondary to his cough, some SOB, congestion and sore throat that began 2 days ago, subjective fever and chills over the weekend, and headache. Patient notes at one point, he had 4 BM's in the span of 1 hour. He denies hematochezia, visual changes, abdominal pain, dysuria, leg swelling, bleeding easily, back pain, myalgias, or rash.    Past Medical History  Diagnosis Date  . Acid reflux    Past Surgical History  Procedure Laterality Date  . Hip surgery     No family history on file. Social History  Substance Use Topics  . Smoking status: Current Every Day Smoker    Types: Cigarettes  . Smokeless tobacco: None  . Alcohol Use: Yes     Comment: beer and liquor twice weekly     Review of Systems  Constitutional: Positive for fever (subjective) and chills.  HENT: Positive for congestion and sore throat.   Eyes: Negative for visual disturbance.  Respiratory: Positive for cough and shortness of breath (mild).   Cardiovascular: Positive for chest pain (secondary to cough). Negative for leg swelling.  Gastrointestinal: Positive for nausea, vomiting and diarrhea. Negative for abdominal pain and blood in stool.  Genitourinary: Negative for dysuria.  Musculoskeletal: Negative for myalgias and back pain.  Skin: Negative for rash.  Neurological: Positive for headaches.  Hematological: Does not bruise/bleed easily.  All other systems reviewed and are negative.     Allergies  Review of patient's allergies indicates no known allergies.  Home Medications   Prior to Admission medications   Medication Sig Start Date End Date Taking? Authorizing Provider  Chlorphen-Pseudoephed-APAP (THERAFLU FLU/COLD PO) Take 2 capsules by mouth every 6 (six) hours as needed (flu-like symptoms).   Yes Historical Provider, MD  Cimetidine (ACID REDUCER PO) Take 1 tablet by mouth 2 (two) times daily. OTC   Yes Historical Provider, MD  ibuprofen (ADVIL,MOTRIN) 200 MG tablet Take 400 mg by mouth every 6 (six) hours as needed for pain.   Yes Historical Provider, MD  dextromethorphan-guaiFENesin (MUCINEX DM) 30-600 MG 12hr tablet Take 1 tablet by mouth 2 (two) times daily. 12/28/15   Vanetta Mulders, MD  loperamide (IMODIUM A-D) 2 MG tablet Take 1 tablet (2 mg total) by mouth 4 (four) times daily as needed for diarrhea or loose  stools. 12/28/15   Vanetta Mulders, MD   BP 150/80 mmHg  Pulse 79  Temp(Src) 99.5 F (37.5 C) (Oral)  Resp 18  Ht 5' 10.5" (1.791 m)  Wt 82.101 kg  BMI 25.60 kg/m2  SpO2 99% Physical Exam  Constitutional: He is oriented to person, place, and time. He appears well-developed and well-nourished. No distress.  HENT:  Head: Normocephalic and atraumatic.   Mouth/Throat: Oropharynx is clear and moist. No oropharyngeal exudate.  Mucous membranes are moist. Throat appears normal, with no exudate. Uvula is midline.  Eyes: Conjunctivae and EOM are normal. Pupils are equal, round, and reactive to light. No scleral icterus.  Pupils are normal. Sclera is clear. Eyes track normal.    Neck: Neck supple. No tracheal deviation present.  Cardiovascular: Normal rate, regular rhythm and normal heart sounds.   Pulmonary/Chest: Effort normal and breath sounds normal. No respiratory distress. He has no wheezes. He has no rales.  Lungs are clear to auscultation bilaterally.  Abdominal: Soft. Bowel sounds are normal. There is no tenderness.  Musculoskeletal: Normal range of motion. He exhibits no edema.  No ankle swelling.  Neurological: He is alert and oriented to person, place, and time.  Skin: Skin is warm and dry.  Psychiatric: He has a normal mood and affect. His behavior is normal.  Nursing note and vitals reviewed.   ED Course  Procedures (including critical care time)  DIAGNOSTIC STUDIES: Oxygen Saturation is 99% on RA, normal by my interpretation.    COORDINATION OF CARE: 11:52 AM - Discussed treatment plan with pt at bedside which includes CXR and blood tests. Pt verbalized understanding and agreed to plan.   Labs Review Labs Reviewed  CBC WITH DIFFERENTIAL/PLATELET - Abnormal; Notable for the following:    Lymphs Abs 0.6 (*)    All other components within normal limits  BASIC METABOLIC PANEL   Results for orders placed or performed during the hospital encounter of 12/28/15  Basic metabolic panel  Result Value Ref Range   Sodium 138 135 - 145 mmol/L   Potassium 4.3 3.5 - 5.1 mmol/L   Chloride 103 101 - 111 mmol/L   CO2 28 22 - 32 mmol/L   Glucose, Bld 99 65 - 99 mg/dL   BUN 11 6 - 20 mg/dL   Creatinine, Ser 5.40 0.61 - 1.24 mg/dL   Calcium 8.9 8.9 - 98.1 mg/dL   GFR calc non Af Amer >60 >60 mL/min   GFR calc Af Amer >60 >60 mL/min    Anion gap 7 5 - 15  CBC with Differential/Platelet  Result Value Ref Range   WBC 4.1 4.0 - 10.5 K/uL   RBC 4.46 4.22 - 5.81 MIL/uL   Hemoglobin 14.2 13.0 - 17.0 g/dL   HCT 19.1 47.8 - 29.5 %   MCV 96.2 78.0 - 100.0 fL   MCH 31.8 26.0 - 34.0 pg   MCHC 33.1 30.0 - 36.0 g/dL   RDW 62.1 30.8 - 65.7 %   Platelets 187 150 - 400 K/uL   Neutrophils Relative % 69 %   Neutro Abs 2.8 1.7 - 7.7 K/uL   Lymphocytes Relative 15 %   Lymphs Abs 0.6 (L) 0.7 - 4.0 K/uL   Monocytes Relative 15 %   Monocytes Absolute 0.6 0.1 - 1.0 K/uL   Eosinophils Relative 1 %   Eosinophils Absolute 0.0 0.0 - 0.7 K/uL   Basophils Relative 0 %   Basophils Absolute 0.0 0.0 - 0.1 K/uL     Chest x-ray  reviewed by me. No evidence of any acute cardiopulmonary findings. No evidence of pneumonia. No evidence pneumothorax.  Imaging Review No results found. I have personally reviewed and evaluated these images and lab results as part of my medical decision-making.   MDM   Final diagnoses:  Flu-like symptoms    Patient's symptoms consistent with a flulike illness. No acute findings on labs. Chest x-ray negative for pneumonia or pneumothorax. Will treat symptomatically with Mucinex DM and Imodium right ear for the diarrhea. Patient nontoxic no acute distress. Work note provided.   I personally performed the services described in this documentation, which was scribed in my presence. The recorded information has been reviewed and is accurate.       Vanetta Mulders, MD 12/28/15 1425

## 2015-12-28 NOTE — ED Notes (Signed)
Pt taking po fluids at this time.

## 2016-12-19 DIAGNOSIS — F1910 Other psychoactive substance abuse, uncomplicated: Secondary | ICD-10-CM | POA: Insufficient documentation

## 2016-12-19 DIAGNOSIS — Z720 Tobacco use: Secondary | ICD-10-CM | POA: Insufficient documentation

## 2016-12-19 DIAGNOSIS — F1010 Alcohol abuse, uncomplicated: Secondary | ICD-10-CM | POA: Insufficient documentation

## 2016-12-21 ENCOUNTER — Ambulatory Visit: Payer: Self-pay | Admitting: Family Medicine

## 2016-12-28 ENCOUNTER — Ambulatory Visit: Payer: Self-pay | Admitting: Family Medicine

## 2017-01-24 ENCOUNTER — Encounter (HOSPITAL_COMMUNITY): Payer: Self-pay | Admitting: *Deleted

## 2017-01-24 ENCOUNTER — Emergency Department (HOSPITAL_COMMUNITY)
Admission: EM | Admit: 2017-01-24 | Discharge: 2017-01-24 | Disposition: A | Discharge status: Home / Self Care | Payer: BLUE CROSS/BLUE SHIELD | Attending: Emergency Medicine | Admitting: Emergency Medicine

## 2017-01-24 DIAGNOSIS — S299XXA Unspecified injury of thorax, initial encounter: Secondary | ICD-10-CM | POA: Diagnosis present

## 2017-01-24 DIAGNOSIS — S0081XA Abrasion of other part of head, initial encounter: Secondary | ICD-10-CM | POA: Insufficient documentation

## 2017-01-24 DIAGNOSIS — M546 Pain in thoracic spine: Secondary | ICD-10-CM | POA: Insufficient documentation

## 2017-01-24 DIAGNOSIS — F17210 Nicotine dependence, cigarettes, uncomplicated: Secondary | ICD-10-CM | POA: Insufficient documentation

## 2017-01-24 DIAGNOSIS — S0990XA Unspecified injury of head, initial encounter: Secondary | ICD-10-CM | POA: Diagnosis not present

## 2017-01-24 DIAGNOSIS — Y92410 Unspecified street and highway as the place of occurrence of the external cause: Secondary | ICD-10-CM | POA: Insufficient documentation

## 2017-01-24 DIAGNOSIS — Y999 Unspecified external cause status: Secondary | ICD-10-CM | POA: Diagnosis not present

## 2017-01-24 DIAGNOSIS — T148XXA Other injury of unspecified body region, initial encounter: Secondary | ICD-10-CM

## 2017-01-24 DIAGNOSIS — Y939 Activity, unspecified: Secondary | ICD-10-CM | POA: Diagnosis not present

## 2017-01-24 MED ORDER — CYCLOBENZAPRINE HCL 5 MG PO TABS: 5.0000 mg | ORAL_TABLET | Freq: Three times a day (TID) | ORAL | 0 refills | Status: DC | PRN
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
## 2017-02-02 ENCOUNTER — Ambulatory Visit: Payer: BLUE CROSS/BLUE SHIELD | Admitting: Family Medicine

## 2017-05-17 ENCOUNTER — Encounter: Payer: Self-pay | Admitting: Family Medicine

## 2017-05-21 ENCOUNTER — Encounter: Payer: Self-pay | Admitting: Internal Medicine

## 2017-07-13 ENCOUNTER — Encounter: Payer: Self-pay | Admitting: Gastroenterology

## 2017-07-13 ENCOUNTER — Other Ambulatory Visit: Payer: Self-pay

## 2017-07-13 ENCOUNTER — Telehealth: Payer: Self-pay

## 2017-07-13 ENCOUNTER — Ambulatory Visit (INDEPENDENT_AMBULATORY_CARE_PROVIDER_SITE_OTHER): Payer: BLUE CROSS/BLUE SHIELD | Admitting: Gastroenterology

## 2017-07-13 VITALS — BP 124/73 | HR 103 | Temp 98.7°F | Ht 69.0 in | Wt 161.4 lb

## 2017-07-13 DIAGNOSIS — Z1211 Encounter for screening for malignant neoplasm of colon: Secondary | ICD-10-CM | POA: Diagnosis not present

## 2017-07-13 DIAGNOSIS — R1013 Epigastric pain: Secondary | ICD-10-CM

## 2017-07-13 DIAGNOSIS — R1310 Dysphagia, unspecified: Secondary | ICD-10-CM | POA: Diagnosis not present

## 2017-07-13 DIAGNOSIS — K2100 Gastro-esophageal reflux disease with esophagitis, without bleeding: Secondary | ICD-10-CM

## 2017-07-13 MED ORDER — PEG 3350-KCL-NA BICARB-NACL 420 G PO SOLR: 4000.0000 mL | ORAL | 0 refills | Status: DC

## 2017-07-13 MED ORDER — PANTOPRAZOLE SODIUM 40 MG PO TBEC: 40.0000 mg | DELAYED_RELEASE_TABLET | Freq: Every day | ORAL | 11 refills | Status: DC

## 2017-07-13 NOTE — Assessment & Plan Note (Signed)
First ever screening colonoscopy in the near future, deep sedation in the OR.  I have discussed the risks, alternatives, benefits with regards to but not limited to the risk of reaction to medication, bleeding, infection, perforation and the patient is agreeable to proceed. Written consent to be obtained.

## 2017-07-13 NOTE — Progress Notes (Signed)
cc'ed to pcp °

## 2017-07-13 NOTE — Patient Instructions (Signed)
1. Start pantoprazole 40 mg daily before breakfast. Prescription sent to [facility]. 2. Colonoscopy and upper endoscopy as scheduled. Please see separate instructions. 3. Limit alcohol use, this is likely aggravating your acid reflux. Try not to drink daily. When you do drink do not consume more than 1-2 drinks in a 24-hour period of time.

## 2017-07-13 NOTE — Assessment & Plan Note (Addendum)
51 year old gentleman with severe heartburn, vomiting, epigastric pain, odynophagia. 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 Recommend upper endoscopy for further evaluation of severe symptoms. Screen for Barrett's. Plan for deep sedation in the OR given history of alcohol abuse and remote drug use.  I have discussed the risks, alternatives, benefits with regards to but not limited to the risk of reaction to medication, bleeding, infection, perforation and the patient is agreeable to proceed. Written consent to be obtained.  Start pantoprazole 40 mg daily. Encouraged cutting back on alcohol use.

## 2017-07-13 NOTE — Progress Notes (Signed)
Primary Care Physician:  Sinda Du, MD  Primary Gastroenterologist:  Garfield Cornea, MD   Chief Complaint  Patient presents with  . Gastroesophageal Reflux    HPI:  David Knox is a 51 y.o. male here At the request of Dr. Luan Pulling for further evaluation of reflux. We met the patient back in 2002 for hematemesis. He had ulcer reflux esophagitis and a moderate-sized hiatal hernia at that time. Patient states he's been having severe heartburn for years. Associated with vomiting. Eats a bottle of Tums weekly. Tried over-the-counter acid reducers without relief. Just recently got insurance and wanted to be checked out. Some odynophagia but no dysphagia. Some hematemesis intermittent. Some epigastric pain. BM 2-3 times per day. No brbpr. No melena. No ASA powders. Ibuprofen infrequent. No significant weight loss. No prior colonoscopy.   Current Outpatient Prescriptions  Medication Sig Dispense Refill  . Cimetidine (ACID REDUCER PO) Take 1 tablet by mouth 2 (two) times daily. OTC     No current facility-administered medications for this visit.     Allergies as of 07/13/2017  . (No Known Allergies)    Past Medical History:  Diagnosis Date  . Acid reflux     Past Surgical History:  Procedure Laterality Date  . ESOPHAGOGASTRODUODENOSCOPY  2002   RMR: ulcerative reflux esophagitis, moderate sized hiatal hernia  . HIP SURGERY    . WRIST SURGERY      Family History  Problem Relation Age of Onset  . Diabetes Mother   . Heart disease Mother   . Diabetes Father   . Heart disease Father   . Colon cancer Neg Hx     Social History   Social History  . Marital status: Single    Spouse name: N/A  . Number of children: N/A  . Years of education: N/A   Occupational History  . Not on file.   Social History Main Topics  . Smoking status: Current Every Day Smoker    Types: Cigarettes  . Smokeless tobacco: Never Used  . Alcohol use Yes     Comment: beer and liquor twice  weekly  . Drug use: No     Comment: "crack" former  . Sexual activity: Not on file   Other Topics Concern  . Not on file   Social History Narrative  . No narrative on file      ROS:  General: Negative for anorexia, weight loss, fever, chills, fatigue, weakness. Eyes: Negative for vision changes.  ENT: Negative for hoarseness, difficulty swallowing , nasal congestion. CV: Negative for chest pain, angina, palpitations, dyspnea on exertion, peripheral edema.  Respiratory: Negative for dyspnea at rest, dyspnea on exertion, cough, sputum, wheezing.  GI: See history of present illness. GU:  Negative for dysuria, hematuria, urinary incontinence, urinary frequency, nocturnal urination.  MS: Negative for joint pain, low back pain.  Derm: Negative for rash or itching.  Neuro: Negative for weakness, abnormal sensation, seizure, frequent headaches, memory loss, confusion.  Psych: Negative for anxiety, depression, suicidal ideation, hallucinations.  Endo: Negative for unusual weight change.  Heme: Negative for bruising or bleeding. Allergy: Negative for rash or hives.    Physical Examination:  BP 124/73   Pulse (!) 103   Temp 98.7 F (37.1 C) (Oral)   Ht '5\' 9"'  (1.753 m)   Wt 161 lb 6.4 oz (73.2 kg)   BMI 23.83 kg/m    General: Well-nourished, well-developed in no acute distress.  Head: Normocephalic, atraumatic.   Eyes: Conjunctiva pink, no icterus. Mouth:  Oropharyngeal mucosa moist and pink , no lesions erythema or exudate. Neck: Supple without thyromegaly, masses, or lymphadenopathy.  Lungs: Clear to auscultation bilaterally.  Heart: Regular rate and rhythm, no murmurs rubs or gallops.  Abdomen: Bowel sounds are normal, nontender, nondistended, no hepatosplenomegaly or masses, no abdominal bruits or    hernia , no rebound or guarding.   Rectal: not performed Extremities: No lower extremity edema. No clubbing or deformities.  Neuro: Alert and oriented x 4 , grossly normal  neurologically.  Skin: Warm and dry, no rash or jaundice.   Psych: Alert and cooperative, normal mood and affect.

## 2017-07-13 NOTE — Telephone Encounter (Signed)
Tried to call pt to inform of pre-op appt 07/26/17 at 1:45pm, no answer, LMOVM. Letter mailed.

## 2017-07-16 ENCOUNTER — Telehealth: Payer: Self-pay

## 2017-07-16 NOTE — Telephone Encounter (Signed)
Called [employer] for PA of TCS/EGD, no PA needed. Ref# [id number].

## 2017-07-23 NOTE — Patient Instructions (Signed)
David Knox  07/23/2017     @   Your procedure is scheduled on 07/30/2017.  Report to [facility] at 11:30 A.M.  Call this number if you have problems the morning of surgery:  [phone]   Remember:  Do not eat food or drink liquids after midnight.  Take these medicines the morning of surgery with A SIP OF WATER : Protonix   Do not wear jewelry, make-up or nail polish.  Do not wear lotions, powders, or perfumes, or deoderant.  Do not shave 48 hours prior to surgery.  Men may shave face and neck.  Do not bring valuables to the hospital.  [facility] is not responsible for any belongings or valuables.  Contacts, dentures or bridgework may not be worn into surgery.  Leave your suitcase in the car.  After surgery it may be brought to your room.  For patients admitted to the hospital, discharge time will be determined by your treatment team.  Patients discharged the day of surgery will not be allowed to drive home.   Name and phone number of your driver:   family Special instructions:  n/a  Please read over the following fact sheets that you were given. Care and Recovery After Surgery    Colonoscopy, Adult A colonoscopy is an exam to look at the entire large intestine. During the exam, a lubricated, bendable tube is inserted into the anus and then passed into the rectum, colon, and other parts of the large intestine. A colonoscopy is often done as a part of normal colorectal screening or in response to certain symptoms, such as anemia, persistent diarrhea, abdominal pain, and blood in the stool. The exam can help screen for and diagnose medical problems, including:  Tumors.  Polyps.  Inflammation.  Areas of bleeding.  Tell a health care provider about:  Any allergies you have.  All medicines you are taking, including vitamins, herbs, eye drops, creams, and over-the-counter medicines.  Any problems you or family members have had with  anesthetic medicines.  Any blood disorders you have.  Any surgeries you have had.  Any medical conditions you have.  Any problems you have had passing stool. What are the risks? Generally, this is a safe procedure. However, problems may occur, including:  Bleeding.  A tear in the intestine.  A reaction to medicines given during the exam.  Infection (rare).  What happens before the procedure? Eating and drinking restrictions Follow instructions from your health care provider about eating and drinking, which may include:  A few days before the procedure - follow a low-fiber diet. Avoid nuts, seeds, dried fruit, raw fruits, and vegetables.  1-3 days before the procedure - follow a clear liquid diet. Drink only clear liquids, such as clear broth or bouillon, black coffee or tea, clear juice, clear soft drinks or sports drinks, gelatin dessert, and popsicles. Avoid any liquids that contain red or purple dye.  On the day of the procedure - do not eat or drink anything during the 2 hours before the procedure, or within the time period that your health care provider recommends.  Bowel prep If you were prescribed an oral bowel prep to clean out your colon:  Take it as told by your health care provider. Starting the day before your procedure, you will need to drink a large amount of medicated liquid. The liquid will cause you to have multiple loose stools until your stool is almost clear or light green.  If your  skin or anus gets irritated from diarrhea, you may use these to relieve the irritation: ? Medicated wipes, such as adult wet wipes with aloe and vitamin E. ? A skin soothing-product like petroleum jelly.  If you vomit while drinking the bowel prep, take a break for up to 60 minutes and then begin the bowel prep again. If vomiting continues and you cannot take the bowel prep without vomiting, call your health care provider.  General instructions  Ask your health care provider  about changing or stopping your regular medicines. This is especially important if you are taking diabetes medicines or blood thinners.  Plan to have someone take you home from the hospital or clinic. What happens during the procedure?  An IV tube may be inserted into one of your veins.  You will be given medicine to help you relax (sedative).  To reduce your risk of infection: ? Your health care team will wash or sanitize their hands. ? Your anal area will be washed with soap.  You will be asked to lie on your side with your knees bent.  Your health care provider will lubricate a long, thin, flexible tube. The tube will have a camera and a light on the end.  The tube will be inserted into your anus.  The tube will be gently eased through your rectum and colon.  Air will be delivered into your colon to keep it open. You may feel some pressure or cramping.  The camera will be used to take images during the procedure.  A small tissue sample may be removed from your body to be examined under a microscope (biopsy). If any potential problems are found, the tissue will be sent to a lab for testing.  If small polyps are found, your health care provider may remove them and have them checked for cancer cells.  The tube that was inserted into your anus will be slowly removed. The procedure may vary among health care providers and hospitals. What happens after the procedure?  Your blood pressure, heart rate, breathing rate, and blood oxygen level will be monitored until the medicines you were given have worn off.  Do not drive for 24 hours after the exam.  You may have a small amount of blood in your stool.  You may pass gas and have mild abdominal cramping or bloating due to the air that was used to inflate your colon during the exam.  It is up to you to get the results of your procedure. Ask your health care provider, or the department performing the procedure, when your results will  be ready. This information is not intended to replace advice given to you by your health care provider. Make sure you discuss any questions you have with your health care provider. Document Released: 10/20/2000 Document Revised: 08/23/2016 Document Reviewed: 01/04/2016 Elsevier Interactive Patient Education  2018 [employer].

## 2017-07-26 ENCOUNTER — Encounter (HOSPITAL_COMMUNITY)
Admission: RE | Admit: 2017-07-26 | Discharge: 2017-07-26 | Disposition: A | Discharge status: Home / Self Care | Payer: No Typology Code available for payment source | Source: Ambulatory Visit | Attending: Internal Medicine | Admitting: Internal Medicine

## 2017-07-26 ENCOUNTER — Encounter (HOSPITAL_COMMUNITY): Payer: Self-pay

## 2017-07-27 NOTE — OR Nursing (Signed)
Patient scheduled for an EGD and TCS with propofol on Monday with Dr. Jena Gauss. Patient did not show up for PAT appointment. Attempted to call 3 phone numbers (one was wrong number and no answer at the other 2 numbers) to have patient come in now for PAT appointment. Was unsuccessful at getting in touch with the patient. Dr. Jena Gauss notified and said to cancel procedures for Monday since patient did not show up for PAT.

## 2017-07-30 ENCOUNTER — Encounter (HOSPITAL_COMMUNITY): Admission: RE | Payer: Self-pay | Source: Ambulatory Visit

## 2017-07-30 ENCOUNTER — Ambulatory Visit (HOSPITAL_COMMUNITY)
Admission: RE | Admit: 2017-07-30 | Payer: BLUE CROSS/BLUE SHIELD | Source: Ambulatory Visit | Admitting: Internal Medicine

## 2017-07-30 ENCOUNTER — Other Ambulatory Visit: Payer: Self-pay

## 2017-07-30 DIAGNOSIS — Z1211 Encounter for screening for malignant neoplasm of colon: Secondary | ICD-10-CM

## 2017-07-30 DIAGNOSIS — K219 Gastro-esophageal reflux disease without esophagitis: Secondary | ICD-10-CM

## 2017-07-30 SURGERY — COLONOSCOPY WITH PROPOFOL
Anesthesia: Monitor Anesthesia Care

## 2017-08-21 NOTE — Patient Instructions (Signed)
David Knox  08/21/2017     @   Your procedure is scheduled on  08/30/2017   Report to [facility] at  615  A.M.  Call this number if you have problems the morning of surgery:  [phone]   Remember:  Do not eat food or drink liquids after midnight.  Take these medicines the morning of surgery with A SIP OF WATER  Cimetidine or protonix.   Do not wear jewelry, make-up or nail polish.  Do not wear lotions, powders, or perfumes, or deoderant.  Do not shave 48 hours prior to surgery.  Men may shave face and neck.  Do not bring valuables to the hospital.  [facility] is not responsible for any belongings or valuables.  Contacts, dentures or bridgework may not be worn into surgery.  Leave your suitcase in the car.  After surgery it may be brought to your room.  For patients admitted to the hospital, discharge time will be determined by your treatment team.  Patients discharged the day of surgery will not be allowed to drive home.   Name and phone number of your driver:   Family Special instructions:  Follow the diet and prep instructions given to you by Dr Luvenia Starch office.  Please read over the following fact sheets that you were given. Anesthesia Post-op Instructions and Care and Recovery After Surgery       Esophagogastroduodenoscopy Esophagogastroduodenoscopy (EGD) is a procedure to examine the lining of the esophagus, stomach, and first part of the small intestine (duodenum). This procedure is done to check for problems such as inflammation, bleeding, ulcers, or growths. During this procedure, a long, flexible, lighted tube with a camera attached (endoscope) is inserted down the throat. Tell a health care provider about:  Any allergies you have.  All medicines you are taking, including vitamins, herbs, eye drops, creams, and over-the-counter medicines.  Any problems you or family members have had with anesthetic  medicines.  Any blood disorders you have.  Any surgeries you have had.  Any medical conditions you have.  Whether you are pregnant or may be pregnant. What are the risks? Generally, this is a safe procedure. However, problems may occur, including:  Infection.  Bleeding.  A tear (perforation) in the esophagus, stomach, or duodenum.  Trouble breathing.  Excessive sweating.  Spasms of the larynx.  A slowed heartbeat.  Low blood pressure.  What happens before the procedure?  Follow instructions from your health care provider about eating or drinking restrictions.  Ask your health care provider about: ? Changing or stopping your regular medicines. This is especially important if you are taking diabetes medicines or blood thinners. ? Taking medicines such as aspirin and ibuprofen. These medicines can thin your blood. Do not take these medicines before your procedure if your health care provider instructs you not to.  Plan to have someone take you home after the procedure.  If you wear dentures, be ready to remove them before the procedure. What happens during the procedure?  To reduce your risk of infection, your health care team will wash or sanitize their hands.  An IV tube will be put in a vein in your hand or arm. You will get medicines and fluids through this tube.  You will be given one or more of the following: ? A medicine to help you relax (sedative). ? A medicine to numb the area (local anesthetic).  This medicine may be sprayed into your throat. It will make you feel more comfortable and keep you from gagging or coughing during the procedure. ? A medicine for pain.  A mouth guard may be placed in your mouth to protect your teeth and to keep you from biting on the endoscope.  You will be asked to lie on your left side.  The endoscope will be lowered down your throat into your esophagus, stomach, and duodenum.  Air will be put into the endoscope. This will  help your health care provider see better.  The lining of your esophagus, stomach, and duodenum will be examined.  Your health care provider may: ? Take a tissue sample so it can be looked at in a lab (biopsy). ? Remove growths. ? Remove objects (foreign bodies) that are stuck. ? Treat any bleeding with medicines or other devices that stop tissue from bleeding. ? Widen (dilate) or stretch narrowed areas of your esophagus and stomach.  The endoscope will be taken out. The procedure may vary among health care providers and hospitals. What happens after the procedure?  Your blood pressure, heart rate, breathing rate, and blood oxygen level will be monitored often until the medicines you were given have worn off.  Do not eat or drink anything until the numbing medicine has worn off and your gag reflex has returned. This information is not intended to replace advice given to you by your health care provider. Make sure you discuss any questions you have with your health care provider. Document Released: 02/23/2005 Document Revised: 03/30/2016 Document Reviewed: 09/16/2015 Elsevier Interactive Patient Education  2018 [employer]. Esophagogastroduodenoscopy, Care After Refer to this sheet in the next few weeks. These instructions provide you with information about caring for yourself after your procedure. Your health care provider may also give you more specific instructions. Your treatment has been planned according to current medical practices, but problems sometimes occur. Call your health care provider if you have any problems or questions after your procedure. What can I expect after the procedure? After the procedure, it is common to have:  A sore throat.  Nausea.  Bloating.  Dizziness.  Fatigue.  Follow these instructions at home:  Do not eat or drink anything until the numbing medicine (local anesthetic) has worn off and your gag reflex has returned. You will know that the  local anesthetic has worn off when you can swallow comfortably.  Do not drive for 24 hours if you received a medicine to help you relax (sedative).  If your health care provider took a tissue sample for testing during the procedure, make sure to get your test results. This is your responsibility. Ask your health care provider or the department performing the test when your results will be ready.  Keep all follow-up visits as told by your health care provider. This is important. Contact a health care provider if:  You cannot stop coughing.  You are not urinating.  You are urinating less than usual. Get help right away if:  You have trouble swallowing.  You cannot eat or drink.  You have throat or chest pain that gets worse.  You are dizzy or light-headed.  You faint.  You have nausea or vomiting.  You have chills.  You have a fever.  You have severe abdominal pain.  You have black, tarry, or bloody stools. This information is not intended to replace advice given to you by your health care provider. Make sure you discuss any questions  you have with your health care provider. Document Released: 10/09/2012 Document Revised: 03/30/2016 Document Reviewed: 09/16/2015 Elsevier Interactive Patient Education  2018 [employer].  Colonoscopy, Adult A colonoscopy is an exam to look at the entire large intestine. During the exam, a lubricated, bendable tube is inserted into the anus and then passed into the rectum, colon, and other parts of the large intestine. A colonoscopy is often done as a part of normal colorectal screening or in response to certain symptoms, such as anemia, persistent diarrhea, abdominal pain, and blood in the stool. The exam can help screen for and diagnose medical problems, including:  Tumors.  Polyps.  Inflammation.  Areas of bleeding.  Tell a health care provider about:  Any allergies you have.  All medicines you are taking, including vitamins,  herbs, eye drops, creams, and over-the-counter medicines.  Any problems you or family members have had with anesthetic medicines.  Any blood disorders you have.  Any surgeries you have had.  Any medical conditions you have.  Any problems you have had passing stool. What are the risks? Generally, this is a safe procedure. However, problems may occur, including:  Bleeding.  A tear in the intestine.  A reaction to medicines given during the exam.  Infection (rare).  What happens before the procedure? Eating and drinking restrictions Follow instructions from your health care provider about eating and drinking, which may include:  A few days before the procedure - follow a low-fiber diet. Avoid nuts, seeds, dried fruit, raw fruits, and vegetables.  1-3 days before the procedure - follow a clear liquid diet. Drink only clear liquids, such as clear broth or bouillon, black coffee or tea, clear juice, clear soft drinks or sports drinks, gelatin dessert, and popsicles. Avoid any liquids that contain red or purple dye.  On the day of the procedure - do not eat or drink anything during the 2 hours before the procedure, or within the time period that your health care provider recommends.  Bowel prep If you were prescribed an oral bowel prep to clean out your colon:  Take it as told by your health care provider. Starting the day before your procedure, you will need to drink a large amount of medicated liquid. The liquid will cause you to have multiple loose stools until your stool is almost clear or light green.  If your skin or anus gets irritated from diarrhea, you may use these to relieve the irritation: ? Medicated wipes, such as adult wet wipes with aloe and vitamin E. ? A skin soothing-product like petroleum jelly.  If you vomit while drinking the bowel prep, take a break for up to 60 minutes and then begin the bowel prep again. If vomiting continues and you cannot take the bowel  prep without vomiting, call your health care provider.  General instructions  Ask your health care provider about changing or stopping your regular medicines. This is especially important if you are taking diabetes medicines or blood thinners.  Plan to have someone take you home from the hospital or clinic. What happens during the procedure?  An IV tube may be inserted into one of your veins.  You will be given medicine to help you relax (sedative).  To reduce your risk of infection: ? Your health care team will wash or sanitize their hands. ? Your anal area will be washed with soap.  You will be asked to lie on your side with your knees bent.  Your health care provider will  lubricate a long, thin, flexible tube. The tube will have a camera and a light on the end.  The tube will be inserted into your anus.  The tube will be gently eased through your rectum and colon.  Air will be delivered into your colon to keep it open. You may feel some pressure or cramping.  The camera will be used to take images during the procedure.  A small tissue sample may be removed from your body to be examined under a microscope (biopsy). If any potential problems are found, the tissue will be sent to a lab for testing.  If small polyps are found, your health care provider may remove them and have them checked for cancer cells.  The tube that was inserted into your anus will be slowly removed. The procedure may vary among health care providers and hospitals. What happens after the procedure?  Your blood pressure, heart rate, breathing rate, and blood oxygen level will be monitored until the medicines you were given have worn off.  Do not drive for 24 hours after the exam.  You may have a small amount of blood in your stool.  You may pass gas and have mild abdominal cramping or bloating due to the air that was used to inflate your colon during the exam.  It is up to you to get the results of  your procedure. Ask your health care provider, or the department performing the procedure, when your results will be ready. This information is not intended to replace advice given to you by your health care provider. Make sure you discuss any questions you have with your health care provider. Document Released: 10/20/2000 Document Revised: 08/23/2016 Document Reviewed: 01/04/2016 Elsevier Interactive Patient Education  2018 [employer].  Colonoscopy, Adult, Care After This sheet gives you information about how to care for yourself after your procedure. Your health care provider may also give you more specific instructions. If you have problems or questions, contact your health care provider. What can I expect after the procedure? After the procedure, it is common to have:  A small amount of blood in your stool for 24 hours after the procedure.  Some gas.  Mild abdominal cramping or bloating.  Follow these instructions at home: General instructions   For the first 24 hours after the procedure: ? Do not drive or use machinery. ? Do not sign important documents. ? Do not drink alcohol. ? Do your regular daily activities at a slower pace than normal. ? Eat soft, easy-to-digest foods. ? Rest often.  Take over-the-counter or prescription medicines only as told by your health care provider.  It is up to you to get the results of your procedure. Ask your health care provider, or the department performing the procedure, when your results will be ready. Relieving cramping and bloating  Try walking around when you have cramps or feel bloated.  Apply heat to your abdomen as told by your health care provider. Use a heat source that your health care provider recommends, such as a moist heat pack or a heating pad. ? Place a towel between your skin and the heat source. ? Leave the heat on for 20-30 minutes. ? Remove the heat if your skin turns bright red. This is especially important if you  are unable to feel pain, heat, or cold. You may have a greater risk of getting burned. Eating and drinking  Drink enough fluid to keep your urine clear or pale yellow.  Resume your  normal diet as instructed by your health care provider. Avoid heavy or fried foods that are hard to digest.  Avoid drinking alcohol for as long as instructed by your health care provider. Contact a health care provider if:  You have blood in your stool 2-3 days after the procedure. Get help right away if:  You have more than a small spotting of blood in your stool.  You pass large blood clots in your stool.  Your abdomen is swollen.  You have nausea or vomiting.  You have a fever.  You have increasing abdominal pain that is not relieved with medicine. This information is not intended to replace advice given to you by your health care provider. Make sure you discuss any questions you have with your health care provider. Document Released: 06/06/2004 Document Revised: 07/17/2016 Document Reviewed: 01/04/2016 Elsevier Interactive Patient Education  2018 [employer] Anesthesia is a term that refers to techniques, procedures, and medicines that help a person stay safe and comfortable during a medical procedure. Monitored anesthesia care, or sedation, is one type of anesthesia. Your anesthesia specialist may recommend sedation if you will be having a procedure that does not require you to be unconscious, such as:  Cataract surgery.  A dental procedure.  A biopsy.  A colonoscopy.  During the procedure, you may receive a medicine to help you relax (sedative). There are three levels of sedation:  Mild sedation. At this level, you may feel awake and relaxed. You will be able to follow directions.  Moderate sedation. At this level, you will be sleepy. You may not remember the procedure.  Deep sedation. At this level, you will be asleep. You will not remember the  procedure.  The more medicine you are given, the deeper your level of sedation will be. Depending on how you respond to the procedure, the anesthesia specialist may change your level of sedation or the type of anesthesia to fit your needs. An anesthesia specialist will monitor you closely during the procedure. Let your health care provider know about:  Any allergies you have.  All medicines you are taking, including vitamins, herbs, eye drops, creams, and over-the-counter medicines.  Any use of steroids (by mouth or as a cream).  Any problems you or family members have had with sedatives and anesthetic medicines.  Any blood disorders you have.  Any surgeries you have had.  Any medical conditions you have, such as sleep apnea.  Whether you are pregnant or may be pregnant.  Any use of cigarettes, alcohol, or street drugs. What are the risks? Generally, this is a safe procedure. However, problems may occur, including:  Getting too much medicine (oversedation).  Nausea.  Allergic reaction to medicines.  Trouble breathing. If this happens, a breathing tube may be used to help with breathing. It will be removed when you are awake and breathing on your own.  Heart trouble.  Lung trouble.  Before the procedure Staying hydrated Follow instructions from your health care provider about hydration, which may include:  Up to 2 hours before the procedure - you may continue to drink clear liquids, such as water, clear fruit juice, black coffee, and plain tea.  Eating and drinking restrictions Follow instructions from your health care provider about eating and drinking, which may include:  8 hours before the procedure - stop eating heavy meals or foods such as meat, fried foods, or fatty foods.  6 hours before the procedure - stop eating light meals or  foods, such as toast or cereal.  6 hours before the procedure - stop drinking milk or drinks that contain milk.  2 hours before the  procedure - stop drinking clear liquids.  Medicines Ask your health care provider about:  Changing or stopping your regular medicines. This is especially important if you are taking diabetes medicines or blood thinners.  Taking medicines such as aspirin and ibuprofen. These medicines can thin your blood. Do not take these medicines before your procedure if your health care provider instructs you not to.  Tests and exams  You will have a physical exam.  You may have blood tests done to show: ? How well your kidneys and liver are working. ? How well your blood can clot.  General instructions  Plan to have someone take you home from the hospital or clinic.  If you will be going home right after the procedure, plan to have someone with you for 24 hours.  What happens during the procedure?  Your blood pressure, heart rate, breathing, level of pain and overall condition will be monitored.  An IV tube will be inserted into one of your veins.  Your anesthesia specialist will give you medicines as needed to keep you comfortable during the procedure. This may mean changing the level of sedation.  The procedure will be performed. After the procedure  Your blood pressure, heart rate, breathing rate, and blood oxygen level will be monitored until the medicines you were given have worn off.  Do not drive for 24 hours if you received a sedative.  You may: ? Feel sleepy, clumsy, or nauseous. ? Feel forgetful about what happened after the procedure. ? Have a sore throat if you had a breathing tube during the procedure. ? Vomit. This information is not intended to replace advice given to you by your health care provider. Make sure you discuss any questions you have with your health care provider. Document Released: 07/19/2005 Document Revised: 03/31/2016 Document Reviewed: 02/13/2016 Elsevier Interactive Patient Education  2018 [employer], Care After These  instructions provide you with information about caring for yourself after your procedure. Your health care provider may also give you more specific instructions. Your treatment has been planned according to current medical practices, but problems sometimes occur. Call your health care provider if you have any problems or questions after your procedure. What can I expect after the procedure? After your procedure, it is common to:  Feel sleepy for several hours.  Feel clumsy and have poor balance for several hours.  Feel forgetful about what happened after the procedure.  Have poor judgment for several hours.  Feel nauseous or vomit.  Have a sore throat if you had a breathing tube during the procedure.  Follow these instructions at home: For at least 24 hours after the procedure:   Do not: ? Participate in activities in which you could fall or become injured. ? Drive. ? Use heavy machinery. ? Drink alcohol. ? Take sleeping pills or medicines that cause drowsiness. ? Make important decisions or sign legal documents. ? Take care of children on your own.  Rest. Eating and drinking  Follow the diet that is recommended by your health care provider.  If you vomit, drink water, juice, or soup when you can drink without vomiting.  Make sure you have little or no nausea before eating solid foods. General instructions  Have a responsible adult stay with you until you are awake and alert.  Take over-the-counter  and prescription medicines only as told by your health care provider.  If you smoke, do not smoke without supervision.  Keep all follow-up visits as told by your health care provider. This is important. Contact a health care provider if:  You keep feeling nauseous or you keep vomiting.  You feel light-headed.  You develop a rash.  You have a fever. Get help right away if:  You have trouble breathing. This information is not intended to replace advice given to you  by your health care provider. Make sure you discuss any questions you have with your health care provider. Document Released: 02/13/2016 Document Revised: 06/14/2016 Document Reviewed: 02/13/2016 Elsevier Interactive Patient Education  [employer].

## 2017-08-24 ENCOUNTER — Encounter (HOSPITAL_COMMUNITY)
Admission: RE | Admit: 2017-08-24 | Discharge: 2017-08-24 | Disposition: A | Discharge status: Home / Self Care | Payer: Self-pay | Source: Ambulatory Visit | Attending: Internal Medicine | Admitting: Internal Medicine

## 2017-08-24 ENCOUNTER — Encounter (HOSPITAL_COMMUNITY): Payer: Self-pay

## 2017-08-27 ENCOUNTER — Telehealth: Payer: Self-pay

## 2017-08-27 NOTE — Telephone Encounter (Signed)
Endo scheduler called office, pt was a no show for his pre-op 08/24/17. No number was listed in chart for pt. Endo had tried to call his wife's number. She told them she didn't know where he was but she would give him message. I tried to call phone number listed on release of information sheet, unable to reach pt. Attempted to call his wife's (emergency contact) number. A lady answered the phone and stated that she was at work and hung up the phone. Endo scheduler advised to cancel pt's procedure for 08/30/17.  Routing to [facility] as FYI.

## 2017-08-27 NOTE — Telephone Encounter (Signed)
Noted  

## 2017-08-27 NOTE — Telephone Encounter (Signed)
This was the second time he no showed for pre-op and had to have his procedures cancelled. Also did that for 07/30/17 procedure date.   Must have OV prior to rescheduling.

## 2017-08-30 ENCOUNTER — Ambulatory Visit (HOSPITAL_COMMUNITY): Admission: RE | Admit: 2017-08-30 | Payer: Self-pay | Source: Ambulatory Visit | Admitting: Internal Medicine

## 2017-08-30 ENCOUNTER — Encounter (HOSPITAL_COMMUNITY): Admission: RE | Payer: Self-pay | Source: Ambulatory Visit

## 2017-08-30 SURGERY — COLONOSCOPY WITH PROPOFOL
Anesthesia: Monitor Anesthesia Care

## 2019-03-13 ENCOUNTER — Ambulatory Visit (HOSPITAL_COMMUNITY)
Admission: RE | Admit: 2019-03-13 | Discharge: 2019-03-13 | Disposition: A | Discharge status: Home / Self Care | Payer: PRIVATE HEALTH INSURANCE | Source: Ambulatory Visit | Attending: Pulmonary Disease | Admitting: Pulmonary Disease

## 2019-03-13 ENCOUNTER — Other Ambulatory Visit (HOSPITAL_COMMUNITY): Payer: Self-pay | Admitting: Pulmonary Disease

## 2019-03-13 ENCOUNTER — Other Ambulatory Visit: Payer: Self-pay

## 2019-03-13 DIAGNOSIS — R200 Anesthesia of skin: Secondary | ICD-10-CM | POA: Insufficient documentation

## 2019-03-13 LAB — COMPREHENSIVE METABOLIC PANEL
Albumin: 4.4 (ref 3.5–5.0)
Calcium: 10.3 (ref 8.7–10.7)
GFR calc Af Amer: 77
GFR calc non Af Amer: 66
Globulin: 3.1

## 2019-03-13 LAB — LIPID PANEL
Cholesterol: 138 (ref 0–200)
HDL: 50 (ref 35–70)
LDL Cholesterol: 58
Triglycerides: 253 — AB (ref 40–160)

## 2019-03-13 LAB — BASIC METABOLIC PANEL
BUN: 15 (ref 4–21)
CO2: 30 — AB (ref 13–22)
Chloride: 104 (ref 99–108)
Creatinine: 1.3 (ref <=1.3)
Glucose: 99
Potassium: 4.8 (ref 3.4–5.3)
Sodium: 142 (ref 137–147)

## 2019-03-13 LAB — CBC AND DIFFERENTIAL
HCT: 44 (ref 41–53)
Hemoglobin: 14.8 (ref 13.5–17.5)
Neutrophils Absolute: 3562
Platelets: 340 (ref 150–399)
WBC: 6.1

## 2019-03-13 LAB — TSH: TSH: 0.32 — AB (ref <=5.90)

## 2019-03-13 LAB — HEPATIC FUNCTION PANEL
ALT: 18 (ref 10–40)
AST: 20 (ref 14–40)
Alkaline Phosphatase: 58 (ref 25–125)
Bilirubin, Total: 0.3

## 2019-03-13 LAB — PSA: PSA: 0.5

## 2019-03-13 LAB — CBC: RBC: 4.71 (ref 3.87–5.11)

## 2019-03-27 ENCOUNTER — Other Ambulatory Visit: Payer: Self-pay | Admitting: Pulmonary Disease

## 2019-03-27 ENCOUNTER — Other Ambulatory Visit (HOSPITAL_COMMUNITY): Payer: Self-pay | Admitting: Pulmonary Disease

## 2019-03-27 DIAGNOSIS — M549 Dorsalgia, unspecified: Secondary | ICD-10-CM

## 2019-04-02 ENCOUNTER — Ambulatory Visit (HOSPITAL_COMMUNITY): Payer: PRIVATE HEALTH INSURANCE

## 2019-04-10 ENCOUNTER — Ambulatory Visit (HOSPITAL_COMMUNITY): Payer: PRIVATE HEALTH INSURANCE

## 2019-04-21 ENCOUNTER — Ambulatory Visit (HOSPITAL_COMMUNITY): Payer: PRIVATE HEALTH INSURANCE

## 2019-04-28 ENCOUNTER — Encounter (HOSPITAL_COMMUNITY): Payer: Self-pay

## 2019-04-28 ENCOUNTER — Ambulatory Visit (HOSPITAL_COMMUNITY): Payer: PRIVATE HEALTH INSURANCE

## 2019-05-15 ENCOUNTER — Encounter: Payer: Self-pay | Admitting: Internal Medicine

## 2019-06-23 ENCOUNTER — Ambulatory Visit: Payer: PRIVATE HEALTH INSURANCE | Admitting: Nurse Practitioner

## 2019-07-15 ENCOUNTER — Ambulatory Visit: Payer: PRIVATE HEALTH INSURANCE | Admitting: Nurse Practitioner

## 2019-07-15 ENCOUNTER — Encounter: Payer: Self-pay | Admitting: Nurse Practitioner

## 2019-07-15 NOTE — Progress Notes (Deleted)
Referring Provider: Kari BaarsHawkins, Edward, MD Primary Care Physician:  Kari BaarsHawkins, Edward, MD Primary GI:  Dr. Jena Gaussourk  No chief complaint on file.   HPI:   David Knox is a 53 y.o. male who presents on referral from primary care to schedule colonoscopy.  We did see the patient 07/13/2017 for epigastric abdominal pain, encounter for colonoscopy, esophagitis, and odynophagia.  At that time noted remote history of ulcer, reflux, esophagitis and a moderate size hiatal hernia.  At his last visit he noted severe heartburn for years associated with vomiting, eats a follow-up times weekly.  Just recently obtained health insurance.  Some odynophagia but no dysphagia.  Some hematemesis intermittently and epigastric pain.  Bowel movement 2-3 times a day.  No ASA Potters but ibuprofen infrequently.  No previous colonoscopy.  Recommended Protonix 40 mg daily, colonoscopy and upper endoscopy, limit alcohol.  We attempted to schedule his procedures twice.  He was a no-show to preop appointment 2 times.  Recommended would need an office visit prior to rescheduling.  No further communication from the patient.  Today he states   Past Medical History:  Diagnosis Date  . Acid reflux     Past Surgical History:  Procedure Laterality Date  . ESOPHAGOGASTRODUODENOSCOPY  2002   RMR: ulcerative reflux esophagitis, moderate sized hiatal hernia  . HIP SURGERY    . WRIST SURGERY      Current Outpatient Medications  Medication Sig Dispense Refill  . Cimetidine (ACID REDUCER PO) Take 1 tablet by mouth 2 (two) times daily. OTC    . pantoprazole (PROTONIX) 40 MG tablet Take 1 tablet (40 mg total) by mouth daily before breakfast. 30 tablet 11  . polyethylene glycol-electrolytes (TRILYTE) 420 g solution Take 4,000 mLs by mouth as directed. 4000 mL 0   No current facility-administered medications for this visit.     Allergies as of 07/15/2019  . (No Known Allergies)    Family History  Problem Relation Age of  Onset  . Diabetes Mother   . Heart disease Mother   . Diabetes Father   . Heart disease Father   . Colon cancer Neg Hx     Social History   Socioeconomic History  . Marital status: Single    Spouse name: Not on file  . Number of children: Not on file  . Years of education: Not on file  . Highest education level: Not on file  Occupational History  . Not on file  Social Needs  . Financial resource strain: Not on file  . Food insecurity    Worry: Not on file    Inability: Not on file  . Transportation needs    Medical: Not on file    Non-medical: Not on file  Tobacco Use  . Smoking status: Current Every Day Smoker    Types: Cigarettes  . Smokeless tobacco: Never Used  Substance and Sexual Activity  . Alcohol use: Yes    Comment: beer and liquor twice weekly  . Drug use: No    Comment: "crack" former  . Sexual activity: Not on file  Lifestyle  . Physical activity    Days per week: Not on file    Minutes per session: Not on file  . Stress: Not on file  Relationships  . Social Musicianconnections    Talks on phone: Not on file    Gets together: Not on file    Attends religious service: Not on file    Active member of club or  organization: Not on file    Attends meetings of clubs or organizations: Not on file    Relationship status: Not on file  Other Topics Concern  . Not on file  Social History Narrative  . Not on file    Review of Systems: General: Negative for anorexia, weight loss, fever, chills, fatigue, weakness. Eyes: Negative for vision changes.  ENT: Negative for hoarseness, difficulty swallowing , nasal congestion. CV: Negative for chest pain, angina, palpitations, dyspnea on exertion, peripheral edema.  Respiratory: Negative for dyspnea at rest, dyspnea on exertion, cough, sputum, wheezing.  GI: See history of present illness. GU:  Negative for dysuria, hematuria, urinary incontinence, urinary frequency, nocturnal urination.  MS: Negative for joint pain,  low back pain.  Derm: Negative for rash or itching.  Neuro: Negative for weakness, abnormal sensation, seizure, frequent headaches, memory loss, confusion.  Psych: Negative for anxiety, depression, suicidal ideation, hallucinations.  Endo: Negative for unusual weight change.  Heme: Negative for bruising or bleeding. Allergy: Negative for rash or hives.   Physical Exam: There were no vitals taken for this visit. General:   Alert and oriented. Pleasant and cooperative. Well-nourished and well-developed.  Head:  Normocephalic and atraumatic. Eyes:  Without icterus, sclera clear and conjunctiva pink.  Ears:  Normal auditory acuity. Mouth:  No deformity or lesions, oral mucosa pink.  Throat/Neck:  Supple, without mass or thyromegaly. Cardiovascular:  S1, S2 present without murmurs appreciated. Normal pulses noted. Extremities without clubbing or edema. Respiratory:  Clear to auscultation bilaterally. No wheezes, rales, or rhonchi. No distress.  Gastrointestinal:  +BS, soft, non-tender and non-distended. No HSM noted. No guarding or rebound. No masses appreciated.  Rectal:  Deferred  Musculoskalatal:  Symmetrical without gross deformities. Normal posture. Skin:  Intact without significant lesions or rashes. Neurologic:  Alert and oriented x4;  grossly normal neurologically. Psych:  Alert and cooperative. Normal mood and affect. Heme/Lymph/Immune: No significant cervical adenopathy. No excessive bruising noted.    07/15/2019 1:11 PM   Disclaimer: This note was dictated with voice recognition software. Similar sounding words can inadvertently be transcribed and may not be corrected upon review.

## 2019-11-18 ENCOUNTER — Encounter (HOSPITAL_COMMUNITY): Payer: Self-pay | Admitting: Emergency Medicine

## 2019-11-18 ENCOUNTER — Emergency Department (HOSPITAL_COMMUNITY)
Admission: EM | Admit: 2019-11-18 | Discharge: 2019-11-18 | Disposition: A | Discharge status: Home / Self Care | Payer: BC Managed Care – PPO | Attending: Emergency Medicine | Admitting: Emergency Medicine

## 2019-11-18 ENCOUNTER — Emergency Department (HOSPITAL_COMMUNITY): Payer: BC Managed Care – PPO

## 2019-11-18 ENCOUNTER — Other Ambulatory Visit: Payer: Self-pay

## 2019-11-18 DIAGNOSIS — Z5321 Procedure and treatment not carried out due to patient leaving prior to being seen by health care provider: Secondary | ICD-10-CM | POA: Diagnosis not present

## 2019-11-18 DIAGNOSIS — M25551 Pain in right hip: Secondary | ICD-10-CM | POA: Diagnosis not present

## 2019-11-18 DIAGNOSIS — Z96641 Presence of right artificial hip joint: Secondary | ICD-10-CM | POA: Diagnosis not present

## 2019-11-18 NOTE — ED Notes (Signed)
Called no answer

## 2019-11-18 NOTE — ED Triage Notes (Signed)
Pt reports right hip pain x3 days. Pt reports had right hip replacement at the age of 20. Pt reports pain increased after picking up a scooter off of a truck. Pt reports is able to bear weight but with pain.

## 2019-12-22 DIAGNOSIS — R030 Elevated blood-pressure reading, without diagnosis of hypertension: Secondary | ICD-10-CM | POA: Diagnosis not present

## 2019-12-22 DIAGNOSIS — F17210 Nicotine dependence, cigarettes, uncomplicated: Secondary | ICD-10-CM | POA: Diagnosis not present

## 2019-12-22 DIAGNOSIS — K219 Gastro-esophageal reflux disease without esophagitis: Secondary | ICD-10-CM | POA: Diagnosis not present

## 2020-01-29 ENCOUNTER — Other Ambulatory Visit: Payer: Self-pay

## 2020-01-29 ENCOUNTER — Ambulatory Visit: Payer: BC Managed Care – PPO | Admitting: Orthopaedic Surgery

## 2020-01-29 ENCOUNTER — Encounter: Payer: Self-pay | Admitting: Orthopaedic Surgery

## 2020-01-29 ENCOUNTER — Ambulatory Visit: Payer: BC Managed Care – PPO

## 2020-01-29 VITALS — BP 138/102 | HR 75 | Temp 98.2°F | Ht 71.0 in | Wt 182.4 lb

## 2020-01-29 DIAGNOSIS — M25551 Pain in right hip: Secondary | ICD-10-CM

## 2020-01-29 DIAGNOSIS — F17210 Nicotine dependence, cigarettes, uncomplicated: Secondary | ICD-10-CM | POA: Diagnosis not present

## 2020-01-29 MED ORDER — PREDNISONE 5 MG (21) PO TBPK: ORAL_TABLET | ORAL | 0 refills | Status: DC

## 2020-01-29 MED ORDER — HYDROCODONE-ACETAMINOPHEN 5-325 MG PO TABS: 1.0000 | ORAL_TABLET | ORAL | 0 refills | Status: AC | PRN

## 2020-01-29 NOTE — Progress Notes (Signed)
Subjective:    Patient ID: David Knox, male    DOB: [birth year], 54 y.o.   MRN: [id number]  HPI He has pain in the right hip.  I saw him about 40 years ago for slipped capital femoral epiphysis and did a pinning and then subsequent pin removal.  He did well until the last year or so.  He has pain of the right hip.  I had told him and his parents years ago that he could develop arthritis in the hip.  He tells me I am right.  He has pain in cold weather.  He works in the [employer] at [employer].  He has pain with increased activity.  He has tried Tylenol and rubs with little help.  He is getting progressively worse. I had told him years ago that he might need a total hip later in life.  He remembered that.  He has no trauma, no numbness.  Review of Systems  Constitutional: Positive for activity change.  Musculoskeletal: Positive for arthralgias and gait problem.  All other systems reviewed and are negative.  For Review of Systems, all other systems reviewed and are negative.  The following is a summary of the past history medically, past history surgically, known current medicines, social history and family history.  This information is gathered electronically by the computer from prior information and documentation.  I review this each visit and have found including this information at this point in the chart is beneficial and informative.   Past Medical History:  Diagnosis Date  . Acid reflux     Past Surgical History:  Procedure Laterality Date  . ESOPHAGOGASTRODUODENOSCOPY  2002   RMR: ulcerative reflux esophagitis, moderate sized hiatal hernia  . HIP SURGERY    . WRIST SURGERY      Current Outpatient Medications on File Prior to Visit  Medication Sig Dispense Refill  . Cimetidine (ACID REDUCER PO) Take 1 tablet by mouth 2 (two) times daily. OTC    . pantoprazole (PROTONIX) 40 MG tablet Take 1 tablet (40 mg total) by mouth daily before breakfast. 30 tablet 11  .  polyethylene glycol-electrolytes (TRILYTE) 420 g solution Take 4,000 mLs by mouth as directed. (Patient not taking: Reported on 01/29/2020) 4000 mL 0   No current facility-administered medications on file prior to visit.    Social History   Socioeconomic History  . Marital status: Single    Spouse name: Not on file  . Number of children: Not on file  . Years of education: Not on file  . Highest education level: Not on file  Occupational History  . Not on file  Tobacco Use  . Smoking status: Current Every Day Smoker    Packs/day: 1.00    Types: Cigarettes  . Smokeless tobacco: Never Used  Substance and Sexual Activity  . Alcohol use: Yes    Comment: beer and liquor twice weekly  . Drug use: No    Comment: "crack" former  . Sexual activity: Not on file  Other Topics Concern  . Not on file  Social History Narrative  . Not on file   Social Determinants of Health   Financial Resource Strain:   . Difficulty of Paying Living Expenses:   Food Insecurity:   . Worried About Programme researcher, broadcasting/film/video in the Last Year:   . Barista in the Last Year:   Transportation Needs:   . Freight forwarder (Medical):   Marland Kitchen Lack of Transportation (  Non-Medical):   Physical Activity:   . Days of Exercise per Week:   . Minutes of Exercise per Session:   Stress:   . Feeling of Stress :   Social Connections:   . Frequency of Communication with Friends and Family:   . Frequency of Social Gatherings with Friends and Family:   . Attends Religious Services:   . Active Member of Clubs or Organizations:   . Attends Archivist Meetings:   Marland Kitchen Marital Status:   Intimate Partner Violence:   . Fear of Current or Ex-Partner:   . Emotionally Abused:   Marland Kitchen Physically Abused:   . Sexually Abused:     Family History  Problem Relation Age of Onset  . Diabetes Mother   . Heart disease Mother   . Diabetes Father   . Heart disease Father   . Colon cancer Neg Hx     BP (!) 138/102    Pulse 75   Temp 98.2 F (36.8 C)   Ht 5\' 11"  (1.803 m)   Wt 182 lb 6 oz (82.7 kg)   BMI 25.44 kg/m   Body mass index is 25.44 kg/m.      Objective:   Physical Exam Vitals and nursing note reviewed.  Constitutional:      Appearance: He is well-developed.  HENT:     Head: Normocephalic and atraumatic.  Eyes:     Conjunctiva/sclera: Conjunctivae normal.     Pupils: Pupils are equal, round, and reactive to light.  Cardiovascular:     Rate and Rhythm: Normal rate and regular rhythm.  Pulmonary:     Effort: Pulmonary effort is normal.  Abdominal:     Palpations: Abdomen is soft.  Musculoskeletal:     Cervical back: Normal range of motion and neck supple.       Legs:  Skin:    General: Skin is warm and dry.  Neurological:     Mental Status: He is alert and oriented to person, place, and time.     Cranial Nerves: No cranial nerve deficit.     Motor: No abnormal muscle tone.     Coordination: Coordination normal.     Deep Tendon Reflexes: Reflexes are normal and symmetric. Reflexes normal.  Psychiatric:        Behavior: Behavior normal.        Thought Content: Thought content normal.        Judgment: Judgment normal.     X-rays were done of the right hip, reported separately.  Moderate DJD present.      Assessment & Plan:   Encounter Diagnoses  Name Primary?  . Pain in right hip Yes  . Nicotine dependence, cigarettes, uncomplicated    He has "worn out" his right hip. This is not unusual post history of slipped capital femoral epiphysis unfortunately.  He cannot take NSAIDs.  I will try prednisone dose pack.  When it is ended, begin Aleve one bid pc but stop it if it upsets stomach.  I will see him in two weeks.  He is a possible candidate for total hip.  Call if any problem.  Precautions discussed.   Electronically Signed Sanjuana Kava, MD 3/25/202111:45 AM

## 2020-01-29 NOTE — Patient Instructions (Signed)

## 2020-02-03 ENCOUNTER — Ambulatory Visit: Payer: PRIVATE HEALTH INSURANCE | Admitting: Family Medicine

## 2020-02-12 ENCOUNTER — Ambulatory Visit: Payer: BC Managed Care – PPO | Admitting: Orthopaedic Surgery

## 2020-02-12 ENCOUNTER — Other Ambulatory Visit: Payer: Self-pay

## 2020-02-12 ENCOUNTER — Encounter: Payer: Self-pay | Admitting: Orthopaedic Surgery

## 2020-02-12 VITALS — BP 134/82 | HR 86 | Temp 98.0°F | Ht 71.0 in | Wt 182.0 lb

## 2020-02-12 DIAGNOSIS — M25551 Pain in right hip: Secondary | ICD-10-CM | POA: Diagnosis not present

## 2020-02-12 DIAGNOSIS — F17210 Nicotine dependence, cigarettes, uncomplicated: Secondary | ICD-10-CM

## 2020-02-12 NOTE — Progress Notes (Signed)
Patient [id number] David Knox, male DOB:10-27-1966, 54 y.o. NWG:[id number]  Chief Complaint  Patient presents with  . Hip Pain    Right hip pain    HPI  David Knox is a 54 y.o. male who has continued pain of the right hip.  The prednisone only helped a little.  He had slipped capital femoral epiphysis which I pinned many years ago and then removed the pins later. He did well for years but started having pain over the last several years.  He now has marked degenerative changes of the hip.    I have talked to him about a total joint.  I will have him see Dr. Aline Brochure.   Body mass index is 25.38 kg/m.  ROS  Review of Systems  Constitutional: Positive for activity change.  Musculoskeletal: Positive for arthralgias and gait problem.  All other systems reviewed and are negative.   All other systems reviewed and are negative.  The following is a summary of the past history medically, past history surgically, known current medicines, social history and family history.  This information is gathered electronically by the computer from prior information and documentation.  I review this each visit and have found including this information at this point in the chart is beneficial and informative.    Past Medical History:  Diagnosis Date  . Acid reflux     Past Surgical History:  Procedure Laterality Date  . ESOPHAGOGASTRODUODENOSCOPY  2002   RMR: ulcerative reflux esophagitis, moderate sized hiatal hernia  . HIP SURGERY    . WRIST SURGERY      Family History  Problem Relation Age of Onset  . Diabetes Mother   . Heart disease Mother   . Diabetes Father   . Heart disease Father   . Colon cancer Neg Hx     Social History Social History   Tobacco Use  . Smoking status: Current Every Day Smoker    Packs/day: 1.00    Types: Cigarettes  . Smokeless tobacco: Never Used  Substance Use Topics  . Alcohol use: Yes    Comment: beer and liquor twice weekly  . Drug use: No     Comment: "crack" former    No Known Allergies  Current Outpatient Medications  Medication Sig Dispense Refill  . Cimetidine (ACID REDUCER PO) Take 1 tablet by mouth 2 (two) times daily. OTC    . pantoprazole (PROTONIX) 40 MG tablet Take 1 tablet (40 mg total) by mouth daily before breakfast. 30 tablet 11  . polyethylene glycol-electrolytes (TRILYTE) 420 g solution Take 4,000 mLs by mouth as directed. (Patient not taking: Reported on 01/29/2020) 4000 mL 0  . predniSONE (STERAPRED UNI-PAK 21 TAB) 5 MG (21) TBPK tablet Take 6 pills first day; 5 pills second day; 4 pills third day; 3 pills fourth day; 2 pills next day and 1 pill last day. 21 tablet 0   No current facility-administered medications for this visit.     Physical Exam  Blood pressure 134/82, pulse 86, temperature 98 F (36.7 C), height 5\' 11"  (1.803 m), weight 182 lb (82.6 kg).  Constitutional: overall normal hygiene, normal nutrition, well developed, normal grooming, normal body habitus. Assistive device:none  Musculoskeletal: gait and station Limp right, muscle tone and strength are normal, no tremors or atrophy is present.  .  Neurological: coordination overall normal.  Deep tendon reflex/nerve stretch intact.  Sensation normal.  Cranial nerves II-XII intact.   Skin:   Normal overall no scars, lesions, ulcers or rashes.  No psoriasis.  Psychiatric: Alert and oriented x 3.  Recent memory intact, remote memory unclear.  Normal mood and affect. Well groomed.  Good eye contact.  Cardiovascular: overall no swelling, no varicosities, no edema bilaterally, normal temperatures of the legs and arms, no clubbing, cyanosis and good capillary refill.  Lymphatic: palpation is normal.  Right hip has marked decreased motion, internal and external.  Limp right.  NV intact.  All other systems reviewed and are negative   The patient has been educated about the nature of the problem(s) and counseled on treatment options.  The  patient appeared to understand what I have discussed and is in agreement with it.  Encounter Diagnoses  Name Primary?  . Pain in right hip Yes  . Nicotine dependence, cigarettes, uncomplicated     PLAN Call if any problems.  Precautions discussed.  Continue current medications.   Return to clinic to see Dr. Romeo Apple for possible total hip.    Electronically Signed Darreld Mclean, MD 4/8/20219:35 AM

## 2020-02-20 ENCOUNTER — Ambulatory Visit: Payer: BC Managed Care – PPO | Attending: Internal Medicine

## 2020-02-20 DIAGNOSIS — Z23 Encounter for immunization: Secondary | ICD-10-CM

## 2020-02-20 NOTE — Progress Notes (Signed)
   Covid-19 Vaccination Clinic  Name:  David Knox    MRN: [id number] DOB: February 19, 1966  02/20/2020  [name] was observed post Covid-19 immunization for 15 minutes without incident. He was provided with Vaccine Information Sheet and instruction to access the V-Safe system.   [name] was instructed to call 911 with any severe reactions post vaccine: Marland Kitchen Difficulty breathing  . Swelling of face and throat  . A fast heartbeat  . A bad rash all over body  . Dizziness and weakness   Immunizations Administered    Name Date Dose VIS Date Route   Moderna COVID-19 Vaccine 02/20/2020 11:49 AM 0.5 mL 10/07/2019 Intramuscular   Manufacturer: Moderna   Lot: [id number]   NDC: 82993-716-96

## 2020-02-29 NOTE — Progress Notes (Addendum)
[place]  Chief Complaint  Patient presents with  . Hip Pain    right/ history of surgery 54 y.o did well until 1 yr ago     54 year old male presents for surgical evaluation status post bilateral pending of slipped capital femoral epiphysis.  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  He has tried anti-inflammatories got some relief with steroid Dosepak  Otherwise very healthy   Review of Systems  Musculoskeletal: Positive for joint pain.       Joint pain right knee seems to be coming from the hip.  All other systems reviewed and are negative.    Past Medical History:  Diagnosis Date  . Acid reflux     Past Surgical History:  Procedure Laterality Date  . ESOPHAGOGASTRODUODENOSCOPY  2002   RMR: ulcerative reflux esophagitis, moderate sized hiatal hernia  . HIP SURGERY    . WRIST SURGERY      Family History  Problem Relation Age of Onset  . Diabetes Mother   . Heart disease Mother   . Diabetes Father   . Heart disease Father   . Colon cancer Neg Hx    Social History   Tobacco Use  . Smoking status: Current Every Day Smoker    Packs/day: 1.00    Types: Cigarettes  . Smokeless tobacco: Never Used  Substance Use Topics  . Alcohol use: Yes    Comment: beer and liquor twice weekly  . Drug use: No    Comment: "crack" former    No Known Allergies   Current Meds  Medication Sig  . Cimetidine (ACID REDUCER PO) Take 1 tablet by mouth 2 (two) times daily. OTC  . pantoprazole (PROTONIX) 40 MG tablet Take 1 tablet (40 mg total) by mouth daily before breakfast.    BP 139/84   Pulse 71   Ht 5' 10.5" (1.791 m)   Wt 180 lb (81.6 kg)   BMI  25.46 kg/m   Physical Exam Gait unsupported  Normal endormorphic body  No deformities aao x 3  Mood pleasant  Affect normal   Ortho Exam  I did not detect a limp he has positive foot progression angles.  Normal stride length and cadence  Leg lengths appear to be equal  His left hip flexes 125 degrees he has good abduction and abduction arc of total 50 degrees combined with internal rotation of 0 degrees external rotation 30 degrees  Right hip painful flexion at 125 degrees his abduction abduction arc is 50 degrees his internal rotation is 0 degrees and is very painful external rotation 30 degrees  No hip flexion contracture  2+ dorsalis pedis pulses normal color and capillary refill both lower extremities  Normal sensation in both lower extremities as well   MEDICAL DECISION SECTION  xrays ordered?  AP pelvis shows equal leg lengths pistol-grip deformity bilaterally cyst formation abnormal head contours external rotation of the epiphyseal region bilaterally the right side is worse than the left  My independent reading of xrays: The AP and lateral of the right hip confirm the arthritis and head is normal in terms of sphericity but cyst formation is noted femoral head femoral  neck offset is decreased as well with pistol-grip deformity  Classic post Skippy deformities     Encounter Diagnoses  Name Primary?  . Nicotine dependence, cigarettes, uncomplicated Yes  . Chronic pain of right hip   . Arthritis of right hip      PLAN:    Social setting, example stairs, help at home (patient's only stay in the hospital one day), his daughter or one of his other 3 children and ex-wife have agreed to help him at home  There are no steps to get in the home and there are no steps in the home itself.  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  He does  have difficulty squatting at work getting up out of a seated position and getting in and out of his car    Risk of surgery: Bleeding infection risk of amputation DVT pulmonary embolus stiffness continued postop pain  Alternative treatments: Continue with nonoperative care such as but not limited to Tylenol anti-inflammatories tramadol topical medications bracing chronic pain management  Patient factors that increase risk: Do you have diabetes is your A1c normal did have a bleeding history  Pain management: Medication will be tapered off at 6 weeks an alternative measures will be needed to control pain  Postoperatively: Physical therapy will be set up, you have a CPM machine, you have an ice cuff, you will have DVT prevention, you will wear stockings for 4 weeks.  Bedside commode, shower chair, 3 in 1 chair.   Surgical procedure planned: right total hip with ceramic  Advised to stop smoking and ordered patches   Has family to help after surgery  Has no steps in the house   Expect 2 mos OOW   The procedure has been fully reviewed with the patient; The risks and benefits of surgery have been discussed and explained and understood. Alternative treatment has also been reviewed, questions were encouraged and answered. The postoperative plan is also been reviewed.  Nonsurgical treatment as described in the history and physical section was attempted and unsuccessful and the patient has agreed to proceed with surgical intervention to improve their situation.  Meds ordered this encounter  Medications  . nicotine (NICODERM CQ - DOSED IN MG/24 HOURS) 21 mg/24hr patch    Sig: Place 1 patch (21 mg total) onto the skin daily.    Dispense:  28 patch    Refill:  0   Patient aware of the risk of smoking with increased risk of infection.  Agrees to try to stop smoking with the assistance of the medication ordered  Surgery tentatively scheduled for 1 month Fuller Canada, MD 03/02/2020 2:33  PM

## 2020-03-02 ENCOUNTER — Encounter: Payer: Self-pay | Admitting: Orthopedic Surgery

## 2020-03-02 ENCOUNTER — Ambulatory Visit (INDEPENDENT_AMBULATORY_CARE_PROVIDER_SITE_OTHER): Payer: BC Managed Care – PPO | Admitting: Orthopedic Surgery

## 2020-03-02 ENCOUNTER — Other Ambulatory Visit: Payer: Self-pay

## 2020-03-02 ENCOUNTER — Ambulatory Visit: Payer: BC Managed Care – PPO

## 2020-03-02 VITALS — BP 139/84 | HR 71 | Ht 70.5 in | Wt 180.0 lb

## 2020-03-02 DIAGNOSIS — M1611 Unilateral primary osteoarthritis, right hip: Secondary | ICD-10-CM

## 2020-03-02 DIAGNOSIS — G8929 Other chronic pain: Secondary | ICD-10-CM

## 2020-03-02 DIAGNOSIS — M25551 Pain in right hip: Secondary | ICD-10-CM | POA: Diagnosis not present

## 2020-03-02 DIAGNOSIS — F17210 Nicotine dependence, cigarettes, uncomplicated: Secondary | ICD-10-CM

## 2020-03-02 MED ORDER — NICOTINE 21 MG/24HR TD PT24: 21.0000 mg | MEDICATED_PATCH | Freq: Every day | TRANSDERMAL | 0 refills | Status: DC

## 2020-03-02 NOTE — Patient Instructions (Addendum)
Surgery scheduled for the 25th   Try to stop smoking   I ve ordered medication to help with that    Total Hip Replacement  Total hip replacement is a surgery to remove damaged bone in your hip joint and replace it with an artificial (prosthetic) hip joint. The hip is a ball-and-socket type of joint. It has two main parts. The ball part of the joint (femoral head) is the top of the thighbone (femur). The socket part of the joint is a large, hollow area on the outer side of your pelvis (acetabulum) where the femur and pelvis meet. During total hip replacement, one or both parts of the hip joint are replaced, depending on the type of joint damage that you have. The purpose of this surgery is to reduce pain and improve your hip function. Tell a health care provider about:  Any allergies you have.  All medicines you are taking, including vitamins, herbs, eye drops, creams, and over-the-counter medicines.  Any problems you or family members have had with anesthetic medicines.  Any blood disorders you have.  Any surgeries you have had.  Any medical conditions you have.  Whether you are pregnant or may be pregnant. What are the risks? Generally, this is a safe procedure. However, problems may occur, including:  Infection.  Bleeding.  Allergic reactions to medicines.  Damage to nerves or other structures.  Dislocation of the prosthetic joint (prosthesis).  Loosening of the prosthesis.  Fracture of the bone.  A blood clot, which can break loose and travel to your lungs (pulmonary embolus).  Compartment syndrome.  Deep vein thrombosis. What happens before the procedure? Staying hydrated Follow instructions from your health care provider about hydration, which may include:  Up to 2 hours before the procedure - you may continue to drink clear liquids, such as water, clear fruit juice, black coffee, and plain tea. Eating and drinking restrictions  Follow instructions from  your health care provider about eating and drinking, which may include: ? 8 hours before the procedure - stop eating heavy meals or foods such as meat, fried foods, or fatty foods. ? 6 hours before the procedure - stop eating light meals or foods, such as toast or cereal. ? 6 hours before the procedure - stop drinking milk or drinks that contain milk. ? 2 hours before the procedure - stop drinking clear liquids. Medicines  Ask your health care provider about: ? Changing or stopping your regular medicines. This is especially important if you are taking diabetes medicines or blood thinners. ? Taking medicines such as aspirin and ibuprofen. These medicines can thin your blood. Do not take these medicines unless your health care provider tells you to take them. ? Taking over-the-counter medicines, vitamins, herbs, or supplements. General instructions  You may have a physical exam.  You may have testing, such as: ? X-rays or an MRI. ? Blood or urine tests.  Plan to have someone take you home from the hospital or clinic.  Plan to have a responsible adult care for you for at least 24 hours after you leave the hospital or clinic. This is important.  Prepare your home so you can be safe and have easy access to what you need.  Keep your body and teeth clean. Germs from anywhere in your body can travel to your new joint and infect it. Tell your health care provider if you: ? Plan to have dental care and routine cleanings. ? Develop any skin infections.  Avoid shaving  your legs just before surgery. If any shaving is needed, it will be done in the hospital.  Ask your health care provider how your surgical site will be marked or identified. What happens during the procedure?  To lower your risk of infection: ? Your health care team will wash or sanitize their hands. ? Hair may be removed from the surgical area. ? Your skin will be washed with soap.  An IV will be inserted into one of your  veins.  You will be given one or more of the following: ? A medicine to help you relax (sedative). ? A medicine to make you fall asleep (general anesthetic). ? A medicine that is injected into your spine to numb the area below and slightly above the injection site (spinal anesthetic).  An incision will be made so the surgeon can see the bones and tissue in your hip. The location of the incision will depend on the approach used by the surgeon: ? Posterior approach. The incision will be at the back of the hip. ? Anterior approach. The incision will be at the front of the hip.  Then, your surgeon will: ? Use his or her hands to move your hip out of position (dislocate it). ? Cut and remove damaged pieces of bone and cartilage. ? Insert a prosthetic ball and socket into the hip joint. ? Secure the ball and socket in the hip joint. ? Do an X-ray of the hip joint to confirm proper placement. ? Place a drain to remove excess fluid, if needed. ? Close the incision and apply a bandage (dressing) over the surgical site. The procedure may vary among health care providers and hospitals. What happens after the procedure?  Your blood pressure, heart rate, breathing rate, and blood oxygen level will be monitored until the medicines you were given have worn off.  Your neurovascular status will be monitored.  You will be given pain medicine.  You may have to wear compression stockings. These help to prevent blood clots and reduce swelling in your legs. You may also be given a blood-thinning (anticoagulant) medicine.  You will receive physical therapy until your health care provider feels it is safe for you to go home. You may need to use a walker or crutches.  If you had the posterior approach, you may have to use a wedge (hip abduction) pillow when you are in bed. ? This pillow will protect your hip from dislocation by keeping it straight and will prevent your legs from turning inward or away from  your body. Summary  Total hip replacement is a surgery to remove damaged bone in your hip joint and replace it with an artificial (prosthetic) hip joint.  Before the procedure, follow instructions from your health care provider about eating and drinking.  Plan to have someone take you home from the hospital or clinic.  After the surgery, you may have to wear compression stockings. These help to prevent blood clots and reduce swelling in your legs. This information is not intended to replace advice given to you by your health care provider. Make sure you discuss any questions you have with your health care provider. Document Revised: 03/03/2019 Document Reviewed: 07/18/2017 Elsevier Patient Education  2020 [employer]  Preparing for Hip Replacement Getting prepared before hip replacement surgery can make your recovery easier and more comfortable. This document provides some tips and guidelines that will help you prepare for your surgery. Talk with your health care provider so you can  learn what to expect before, during, and after surgery. Ask questions if you do not understand something. To ease concerns about your financial responsibilities, call your insurance company as soon as you decide to have surgery. Ask how much of your surgery and hospital stay will be covered. Also ask about coverage for medical equipment, rehabilitation facilities, and home care. How should I arrange for help? In the first couple weeks after surgery, it will likely be harder for you to do some of your regular activities. You may get tired easily, and you may have limited movement in your leg. Follow these guidelines to make sure you have all the help you need after your surgery:  Plan to have someone take you home from the hospital. Your health care provider will tell you how many days you can expect to be in the hospital.  Cancel all your work, caregiving, and volunteer responsibilities for at least 4-6 weeks  after surgery.  Plan to have someone stay with you day and night for the first week. This person should be someone you are comfortable with. You may need this person to help you with your exercises and personal care, such as bathing and using the toilet.  If you live alone, arrange for someone to take care of your home and pets for the first 4-6 weeks after surgery.  Arrange for drivers to take you to and from follow-up appointments, the grocery store, and other places you may need to go for at least 4-6 weeks.  Consider applying for a disabled parking permit. To get an application, contact the Department of Motor Vehicles or your health care provider's office. How should I prepare my home?      Pick a recovery spot, but do not plan on recovering in bed. Sitting more upright is better for your health. You may want to use a recliner with a small table nearby. Choose a chair with a firm seat that will not allow you to sink down into it. Chairs and sofas that are too soft can allow your hip to bend at an angle greater than 90 degrees. This could put you at risk for dislocating your new hip joint.  Place the items you use most frequently on the small table next to your chair. These items may include the TV remote, a cordless phone, a cell phone, a book or laptop computer, and a water glass.  To see if you will be able to move around in your home with a walker, hold your hands out about 6 inches (15 cm) from your sides, and walk from your recovery spot to your kitchen and bathroom. Then walk from your bed to the bathroom. If you do not hit anything with your hands, you will have enough room for a walker.  Minimize the use of stairs after you return home to reduce your risk of falling or tripping.  Remove all clutter from your floors. Also remove any throw rugs. This will help you avoid tripping after your surgery.  Move the items you use most often in your kitchen, bathroom, and bedroom to shelves  and drawers that are at countertop height.  Prepare a few meals to freeze and reheat later.  Consider getting safety equipment that will be helpful during your recovery, such as: ? Grab bars added in the shower and near the toilet. ? A raised toilet seat to help you get on and off the toilet more easily. ? A tub or shower bench. How should  I prepare my body?  Have a preoperative exam. ? During the exam, your health care provider will make sure that your body is healthy enough to safely have the surgery. ? When you go to the exam, bring a complete list of all your medicines and supplements, including herbs and vitamins. ? You may need to have additional tests to ensure your safety.  Have elective dental care and routine cleanings done before your surgery. Germs from anywhere in your body, including your mouth, can travel to your new joint and infect it. It is important that you do not have any dental work done for at least 3 months after your surgery.  Maintain a healthy diet. Do not change your diet before surgery unless your health care provider tells you to do that.  Do not use any products that contain nicotine or tobacco, such as cigarettes and e-cigarettes. These can delay bone healing after surgery. If you need help quitting, ask your health care provider.  Tell your health care provider if: ? You develop any skin infections or skin irritations. You may need to improve the condition of your skin before surgery. ? You have a fever, a cold, or any other illness in the week before your surgery.  Do not drink any alcohol for at least 48 hours before surgery.  The day before your surgery, follow instructions from your health care provider about showering, eating, drinking, and taking medicines. These directions are for your safety.  Talk to your health care provider about doing exercises before your surgery. ? Be sure to follow the exercise program only as directed by your health care  provider. ? Doing these exercises in the weeks before your surgery may help reduce pain and improve function after surgery. Summary  Getting prepared before hip replacement surgery can make your recovery easier and more comfortable.  Prepare your home and arrange for help at home.  Keep all of your preoperative appointments to ensure that you are ready for your surgery.  Plan to have someone take you home from the hospital and stay with you day and night for the first week. This information is not intended to replace advice given to you by your health care provider. Make sure you discuss any questions you have with your health care provider. Document Revised: 08/11/2018 Document Reviewed: 12/10/2017 Elsevier Patient Education  2020 [employer].  Smoking and Musculoskeletal Health Smoking is bad for your health. Most people know that smoking causes lung disease, heart disease, and cancer. But people may not realize that it also affects their bones, muscles, and joints (musculoskeletal system). When you smoke, the effects on your lungs and heart result in less oxygen for your musculoskeletal system. This can lead to poor bone and joint health. How can smoking affect my musculoskeletal health? Smoking can:  Increase your risk of having weak, thin bones (osteoporosis). Elderly smokers are at higher risk for bone fractures related to osteoporosis.  Decrease the ability of bone-forming cells to make and replace bone (in addition to reducing oxygen and blood flow).  Reduce your body's ability to absorb calcium from your diet. Less calcium means weaker bones.  Interfere with the breakdown of the male hormone estrogen. Smoking lowers estrogen, which is a hormone that helps keep bones strong. Women who smoke may have earlier menopause. Menopause is a risk factor for osteoporosis.  Weaken the tissues that attach bones to muscles (tendons). This can lead to shoulder, back, and other joint  injuries.  Increase  your risk of rheumatoid arthritis or make the condition worse if you already have it.  Slow down healing and increase your risk of infection and other complications if you have a bone fracture or surgery that involves your musculoskeletal system.  Make you get out of breath easily. This can keep you from getting the exercise you need to keep your bones and joints healthy.  Decrease your appetite and body mass. You may lose weight and muscle strength. This can put you at higher risk for muscle injury, joint injury, and broken bones. What actions can I take to prevent musculoskeletal problems? Quit smoking      Do not start smoking. Quit if you already do. Even stopping later in life can improve musculoskeletal health.  Do not use any products that contain nicotine or tobacco. Do not replace cigarette smoking with e-cigarettes. The safety of e-cigarettes is not known, and some may contain harmful chemicals.  Make a plan to quit smoking and commit to it. Look for programs to help you, and ask your health care provider for recommendations and ideas.  Talk with your health care provider about using nicotine replacement medicines to help you quit, such as gum, lozenges, patches, sprays, or pills. Make other lifestyle changes   Eat a healthy diet that includes calcium and vitamin D. These nutrients are important for bone health. ? Calcium is found in dairy foods and green leafy vegetables. ? Vitamin D is found in eggs, fish, and liver. ? Many foods also have vitamin D and calcium added to them (are fortified). ? Ask your health care provider if you would benefit from taking a supplement.  Get out in the sunshine for a short time every day. This increases production of vitamin D.  Get 30 minutes of exercise at least 5 days a week. Weight-bearing and strength exercises are best for musculoskeletal health. Ask your health care provider what type of exercise is safe for  you.  Do not drink alcohol if: ? Your health care provider tells you not to drink. ? You are pregnant, may be pregnant, or are planning to become pregnant.  If you drink alcohol, limit how much you have: ? 0-1 drink a day for women. ? 0-2 drinks a day for men.  Be aware of how much alcohol is in your drink. In the [place] one drink equals one 12 oz bottle of beer (355 mL), one 5 oz glass of wine (148 mL), or one 1 oz glass of hard liquor (44 mL). Where to find more information You may find more information about smoking, musculoskeletal health, and quitting smoking from:  American Academy of Orthopaedic Surgeons: orthoinfo.aaos.org  [employer] of Health, Osteoporosis and Related Bone Diseases [employer]: bones.http://www.myers.net/  HelpGuide.org: helpguide.org  BankR[employer]: smokefree.gov  American Lung Association: lung.org Contact a health care provider if:  You need help to quit smoking. Summary  When you smoke, the effects on your lungs and heart result in less oxygen for your musculoskeletal system.  Even stopping smoking later in life can improve musculoskeletal health.  Do not use any products that contain nicotine or tobacco, such as cigarettes and e-cigarettes.  If you need help quitting, ask your health care provider. This information is not intended to replace advice given to you by your health care provider. Make sure you discuss any questions you have with your health care provider. Document Revised: 07/18/2019 Document Reviewed: 02/18/2018 Elsevier Patient Education  2020 [employer].  Steps to Quit Smoking  Smoking tobacco is the leading cause of preventable death. It can affect almost every organ in the body. Smoking puts you and those around you at risk for developing many serious chronic diseases. Quitting smoking can be difficult, but it is one of the best things that you can do for your health. It is never too late to quit. How do I get ready to  quit? When you decide to quit smoking, create a plan to help you succeed. Before you quit:  Pick a date to quit. Set a date within the next 2 weeks to give you time to prepare.  Write down the reasons why you are quitting. Keep this list in places where you will see it often.  Tell your family, friends, and co-workers that you are quitting. Support from your loved ones can make quitting easier.  Talk with your health care provider about your options for quitting smoking.  Find out what treatment options are covered by your health insurance.  Identify people, places, things, and activities that make you want to smoke (triggers). Avoid them. What first steps can I take to quit smoking?  Throw away all cigarettes at home, at work, and in your car.  Throw away smoking accessories, such as Set designer.  Clean your car. Make sure to empty the ashtray.  Clean your home, including curtains and carpets. What strategies can I use to quit smoking? Talk with your health care provider about combining strategies, such as taking medicines while you are also receiving in-person counseling. Using these two strategies together makes you more likely to succeed in quitting than if you used either strategy on its own.  If you are pregnant or breastfeeding, talk with your health care provider about finding counseling or other support strategies to quit smoking. Do not take medicine to help you quit smoking unless your health care provider tells you to do so. To quit smoking: Quit right away  Quit smoking completely, instead of gradually reducing how much you smoke over a period of time. Research shows that stopping smoking right away is more successful than gradually quitting.  Attend in-person counseling to help you build problem-solving skills. You are more likely to succeed in quitting if you attend counseling sessions regularly. Even short sessions of 10 minutes can be effective. Take  medicine You may take medicines to help you quit smoking. Some medicines require a prescription and some you can purchase over-the-counter. Medicines may have nicotine in them to replace the nicotine in cigarettes. Medicines may:  Help to stop cravings.  Help to relieve withdrawal symptoms. Your health care provider may recommend:  Nicotine patches, gum, or lozenges.  Nicotine inhalers or sprays.  Non-nicotine medicine that is taken by mouth. Find resources Find resources and support systems that can help you to quit smoking and remain smoke-free after you quit. These resources are most helpful when you use them often. They include:  Online chats with a Veterinary surgeon.  Telephone quitlines.  Printed Materials engineer.  Support groups or group counseling.  Text messaging programs.  Mobile phone apps or applications. Use apps that can help you stick to your quit plan by providing reminders, tips, and encouragement. There are many free apps for mobile devices as well as websites. Examples include Quit Guide from the [employer] and smokefree.gov What things can I do to make it easier to quit?   Reach out to your family and friends for support and encouragement. Call telephone quitlines (1-800-QUIT-NOW), reach out to support  groups, or work with a Social worker for support.  Ask people who smoke to avoid smoking around you.  Avoid places that trigger you to smoke, such as bars, parties, or smoke-break areas at work.  Spend time with people who do not smoke.  Lessen the stress in your life. Stress can be a smoking trigger for some people. To lessen stress, try: ? Exercising regularly. ? Doing deep-breathing exercises. ? Doing yoga. ? Meditating. ? Performing a body scan. This involves closing your eyes, scanning your body from head to toe, and noticing which parts of your body are particularly tense. Try to relax the muscles in those areas. How will I feel when I quit smoking? Day 1 to 3  weeks Within the first 24 hours of quitting smoking, you may start to feel withdrawal symptoms. These symptoms are usually most noticeable 2-3 days after quitting, but they usually do not last for more than 2-3 weeks. You may experience these symptoms:  Mood swings.  Restlessness, anxiety, or irritability.  Trouble concentrating.  Dizziness.  Strong cravings for sugary foods and nicotine.  Mild weight gain.  Constipation.  Nausea.  Coughing or a sore throat.  Changes in how the medicines that you take for unrelated issues work in your body.  Depression.  Trouble sleeping (insomnia). Week 3 and afterward After the first 2-3 weeks of quitting, you may start to notice more positive results, such as:  Improved sense of smell and taste.  Decreased coughing and sore throat.  Slower heart rate.  Lower blood pressure.  Clearer skin.  The ability to breathe more easily.  Fewer sick days. Quitting smoking can be very challenging. Do not get discouraged if you are not successful the first time. Some people need to make many attempts to quit before they achieve long-term success. Do your best to stick to your quit plan, and talk with your health care provider if you have any questions or concerns. Summary  Smoking tobacco is the leading cause of preventable death. Quitting smoking is one of the best things that you can do for your health.  When you decide to quit smoking, create a plan to help you succeed.  Quit smoking right away, not slowly over a period of time.  When you start quitting, seek help from your health care provider, family, or friends. This information is not intended to replace advice given to you by your health care provider. Make sure you discuss any questions you have with your health care provider. Document Revised: 07/18/2019 Document Reviewed: 01/11/2019 Elsevier Patient Education  [place].

## 2020-03-03 ENCOUNTER — Ambulatory Visit: Payer: BC Managed Care – PPO | Admitting: Orthopedic Surgery

## 2020-03-19 ENCOUNTER — Encounter (HOSPITAL_COMMUNITY): Payer: Self-pay | Admitting: Anesthesiology

## 2020-03-22 NOTE — Patient Instructions (Signed)
David Knox  03/22/2020     @PREFPERIOPPHARMACY @   Your procedure is scheduled on  03/30/2020 .  Report to 04/01/2020 at  [phone]  A.M.  Call this number if you have problems the morning of surgery:  [phone]   Remember:  Do not eat or drink after midnight.                     Take these medicines the morning of surgery with A SIP OF WATER  Over the counter acid reducer.    Do not wear jewelry, make-up or nail polish.  Do not wear lotions, powders, or perfumes. Please wear deodorant and brush your teeth.  Do not shave 48 hours prior to surgery.  Men may shave face and neck.  Do not bring valuables to the hospital.  [facility] is not responsible for any belongings or valuables.  Contacts, dentures or bridgework may not be worn into surgery.  Leave your suitcase in the car.  After surgery it may be brought to your room.  For patients admitted to the hospital, discharge time will be determined by your treatment team.  Patients discharged the day of surgery will not be allowed to drive home.   Name and phone number of your driver:   Family Special instructions:  DO NOT smoke the morning of your procedure.  Please read over the following fact sheets that you were given. Pain Booklet, Coughing and Deep Breathing, Blood Transfusion Information, Lab Information, Total Joint Packet, Surgical Site Infection Prevention, Anesthesia Post-op Instructions and Care and Recovery After Surgery       Total Hip Replacement, Posterior, Care After This sheet gives you information about how to care for yourself after your procedure. Your doctor may also give you more specific instructions. If you have problems or questions, contact your doctor. What can I expect after the procedure? After the procedure, it is common to have:  Pain and swelling.  A small amount of blood or clear fluid coming from the cut that was made during surgery (incision). Follow these instructions at  home: Medicines  Take over-the-counter and prescription medicines only as told by your doctor.  If you were prescribed a medicine to thin your blood (anticoagulant), take it as told by your doctor. Activity  Ask your doctor what activities are safe for you.  Do exercises as told by your doctor or physical therapist.  Rest often, but move around as much as you can. Movement helps you heal and helps prevent problems.  Do not use your legs to support (bear) your body weight until your doctor says it is okay. Follow instructions about how much weight you may safely support on your affected leg (weight-bearing restrictions).  Use crutches or a walker as told by your doctor. Movement restrictions   To keep from dislocating your hip, follow instructions about movement restrictions as told by your doctor. For example: ? Do not cross your legs at the knees. To remind yourself about this, you may keep a pillow between your legs while lying in bed. ? Do not bend farther than 90 degrees at the hip and waist. To keep from bending this far:  Do not bring your knees higher than your hips.  Do not pick up something from the floor while sitting in a chair.  Avoid sitting in low chairs.  Use a raised toilet seat.  When standing up from a seated position,  keep the injured leg out in front of you. ? Avoid twisting at your waist and reaching across your body to the side of the affected leg. ? Avoid rotating your toes inward on the affected leg.  When getting into a car: 1. Raise the seat as high as you can. 2. Move the seat as far back as it will go. 3. Recline the upper part of the seat slightly. 4. Sit down into the seat with your injured leg extended out of the car. 5. Scoot back in the seat as you move the lower half of your body into the car. Try to avoid bumping your foot or leg as you bring it into the car. To make this motion smooth and easy on a cloth seat, try placing a plastic bag on  the seat. Surgery cut care   Follow instructions from your doctor about how to take care of your cut from surgery. Make sure you: ? Wash your hands with soap and water before you change your bandage (dressing). If you cannot use soap and water, use alcohol-based hand sanitizer. ? Change your bandage as told by your doctor. ? Leave stitches (sutures), skin glue, or skin tape (adhesive) strips in place. They may need to stay in place for 2 weeks or longer. If tape strips get loose and curl up, you may trim the loose edges. Do not remove tape strips completely unless your doctor tells you to do that.  Do not take baths, swim, or use a hot tub until your doctor says it is okay.  Check your surgical cut every day for signs of infection. Check for: ? More redness, swelling, or pain. ? More fluid or blood. ? Warmth. ? Pus or a bad smell. Managing pain, stiffness, and swelling   If directed, put ice on the hip area. ? Put ice in a plastic bag. ? Place a towel between your skin and the bag. ? Leave the ice on for 20 minutes, 2-3 times a day.  Move your toes often to avoid stiffness and to lessen swelling.  Raise (elevate) your leg above the level of your heart while you are sitting or lying down. Driving  Ask your doctor when it is safe to drive. This may not be recommended for up to 6 weeks after surgery.  Do not drive or use heavy machinery while taking prescription pain medicine. General instructions  Wear compression stockings as told by your doctor. These help to prevent blood clots and reduce swelling in your legs.  Do not use any products that have nicotine or tobacco in them, such as cigarettes and e-cigarettes. These can delay bone healing. If you need help quitting, ask your doctor.  If you are taking prescription pain medicine, take actions to prevent or treat constipation. Your doctor may recommend that you: ? Drink enough fluid to keep your pee (urine) pale yellow. ? Eat  foods that are high in fiber. This includes fresh fruits and vegetables, whole grains, and beans. ? Limit foods that are high in fat and processed sugars, such as fried or sweet foods. ? Take an over-the-counter or prescription medicine for constipation.  Tell your doctor if you plan to have dental work. Also: ? Tell your dentist about your joint replacement. ? Ask your doctor if there are instructions you need to follow before dental care and routine cleanings.  Keep all follow-up visits as told by your doctor. This is important. Contact a doctor if:  You have trouble  breathing.  You have either of these at your surgery site: ? Any signs of infection. ? Bleeding that will not stop.  Your surgical cut opens up after stitches or staples are taken out.  You have a fever. Get help right away if:  You have more redness, swelling, or pain in the leg you had surgery on.  You have pain or swelling in your thigh or on the back of your lower leg.  You have shortness of breath.  You have chest pain.  You have pain that is not helped by pain medicine. Summary  Do not use your legs to support your body weight until your doctor says it is okay. Use crutches or a walker as told.  To keep from dislocating your hip, follow instructions about movement restrictions as told.  If you were prescribed a medicine to thin your blood (anticoagulant), take it as told by your doctor.  Keep all follow-up visits as told by your doctor. This is important. This information is not intended to replace advice given to you by your health care provider. Make sure you discuss any questions you have with your health care provider. Document Revised: 03/03/2019 Document Reviewed: 12/04/2017 Elsevier Patient Education  2020 [employer]  General Anesthesia, Adult, Care After This sheet gives you information about how to care for yourself after your procedure. Your health care provider may also give you more  specific instructions. If you have problems or questions, contact your health care provider. What can I expect after the procedure? After the procedure, the following side effects are common:  Pain or discomfort at the IV site.  Nausea.  Vomiting.  Sore throat.  Trouble concentrating.  Feeling cold or chills.  Weak or tired.  Sleepiness and fatigue.  Soreness and body aches. These side effects can affect parts of the body that were not involved in surgery. Follow these instructions at home:  For at least 24 hours after the procedure:  Have a responsible adult stay with you. It is important to have someone help care for you until you are awake and alert.  Rest as needed.  Do not: ? Participate in activities in which you could fall or become injured. ? Drive. ? Use heavy machinery. ? Drink alcohol. ? Take sleeping pills or medicines that cause drowsiness. ? Make important decisions or sign legal documents. ? Take care of children on your own. Eating and drinking  Follow any instructions from your health care provider about eating or drinking restrictions.  When you feel hungry, start by eating small amounts of foods that are soft and easy to digest (bland), such as toast. Gradually return to your regular diet.  Drink enough fluid to keep your urine pale yellow.  If you vomit, rehydrate by drinking water, juice, or clear broth. General instructions  If you have sleep apnea, surgery and certain medicines can increase your risk for breathing problems. Follow instructions from your health care provider about wearing your sleep device: ? Anytime you are sleeping, including during daytime naps. ? While taking prescription pain medicines, sleeping medicines, or medicines that make you drowsy.  Return to your normal activities as told by your health care provider. Ask your health care provider what activities are safe for you.  Take over-the-counter and prescription  medicines only as told by your health care provider.  If you smoke, do not smoke without supervision.  Keep all follow-up visits as told by your health care provider. This is important. Contact  a health care provider if:  You have nausea or vomiting that does not get better with medicine.  You cannot eat or drink without vomiting.  You have pain that does not get better with medicine.  You are unable to pass urine.  You develop a skin rash.  You have a fever.  You have redness around your IV site that gets worse. Get help right away if:  You have difficulty breathing.  You have chest pain.  You have blood in your urine or stool, or you vomit blood. Summary  After the procedure, it is common to have a sore throat or nausea. It is also common to feel tired.  Have a responsible adult stay with you for the first 24 hours after general anesthesia. It is important to have someone help care for you until you are awake and alert.  When you feel hungry, start by eating small amounts of foods that are soft and easy to digest (bland), such as toast. Gradually return to your regular diet.  Drink enough fluid to keep your urine pale yellow.  Return to your normal activities as told by your health care provider. Ask your health care provider what activities are safe for you. This information is not intended to replace advice given to you by your health care provider. Make sure you discuss any questions you have with your health care provider. Document Revised: 10/26/2017 Document Reviewed: 06/08/2017 Elsevier Patient Education  2020 [employer]. How to Use Chlorhexidine for Bathing Chlorhexidine gluconate (CHG) is a germ-killing (antiseptic) solution that is used to clean the skin. It can get rid of the bacteria that normally live on the skin and can keep them away for about 24 hours. To clean your skin with CHG, you may be given:  A CHG solution to use in the shower or as part of a  sponge bath.  A prepackaged cloth that contains CHG. Cleaning your skin with CHG may help lower the risk for infection:  While you are staying in the intensive care unit of the hospital.  If you have a vascular access, such as a central line, to provide short-term or long-term access to your veins.  If you have a catheter to drain urine from your bladder.  If you are on a ventilator. A ventilator is a machine that helps you breathe by moving air in and out of your lungs.  After surgery. What are the risks? Risks of using CHG include:  A skin reaction.  Hearing loss, if CHG gets in your ears.  Eye injury, if CHG gets in your eyes and is not rinsed out.  The CHG product catching fire. Make sure that you avoid smoking and flames after applying CHG to your skin. Do not use CHG:  If you have a chlorhexidine allergy or have previously reacted to chlorhexidine.  On babies younger than 5 months of age. How to use CHG solution  Use CHG only as told by your health care provider, and follow the instructions on the label.  Use the full amount of CHG as directed. Usually, this is one bottle. During a shower Follow these steps when using CHG solution during a shower (unless your health care provider gives you different instructions): 1. Start the shower. 2. Use your normal soap and shampoo to wash your face and hair. 3. Turn off the shower or move out of the shower stream. 4. Pour the CHG onto a clean washcloth. Do not use any type of  brush or rough-edged sponge. 5. Starting at your neck, lather your body down to your toes. Make sure you follow these instructions: ? If you will be having surgery, pay special attention to the part of your body where you will be having surgery. Scrub this area for at least 1 minute. ? Do not use CHG on your head or face. If the solution gets into your ears or eyes, rinse them well with water. ? Avoid your genital area. ? Avoid any areas of skin that have  broken skin, cuts, or scrapes. ? Scrub your back and under your arms. Make sure to wash skin folds. 6. Let the lather sit on your skin for 1-2 minutes or as long as told by your health care provider. 7. Thoroughly rinse your entire body in the shower. Make sure that all body creases and crevices are rinsed well. 8. Dry off with a clean towel. Do not put any substances on your body afterward--such as powder, lotion, or perfume--unless you are told to do so by your health care provider. Only use lotions that are recommended by the manufacturer. 9. Put on clean clothes or pajamas. 10. If it is the night before your surgery, sleep in clean sheets.  During a sponge bath Follow these steps when using CHG solution during a sponge bath (unless your health care provider gives you different instructions): 1. Use your normal soap and shampoo to wash your face and hair. 2. Pour the CHG onto a clean washcloth. 3. Starting at your neck, lather your body down to your toes. Make sure you follow these instructions: ? If you will be having surgery, pay special attention to the part of your body where you will be having surgery. Scrub this area for at least 1 minute. ? Do not use CHG on your head or face. If the solution gets into your ears or eyes, rinse them well with water. ? Avoid your genital area. ? Avoid any areas of skin that have broken skin, cuts, or scrapes. ? Scrub your back and under your arms. Make sure to wash skin folds. 4. Let the lather sit on your skin for 1-2 minutes or as long as told by your health care provider. 5. Using a different clean, wet washcloth, thoroughly rinse your entire body. Make sure that all body creases and crevices are rinsed well. 6. Dry off with a clean towel. Do not put any substances on your body afterward--such as powder, lotion, or perfume--unless you are told to do so by your health care provider. Only use lotions that are recommended by the manufacturer. 7. Put on  clean clothes or pajamas. 8. If it is the night before your surgery, sleep in clean sheets. How to use CHG prepackaged cloths  Only use CHG cloths as told by your health care provider, and follow the instructions on the label.  Use the CHG cloth on clean, dry skin.  Do not use the CHG cloth on your head or face unless your health care provider tells you to.  When washing with the CHG cloth: ? Avoid your genital area. ? Avoid any areas of skin that have broken skin, cuts, or scrapes. Before surgery Follow these steps when using a CHG cloth to clean before surgery (unless your health care provider gives you different instructions): 1. Using the CHG cloth, vigorously scrub the part of your body where you will be having surgery. Scrub using a back-and-forth motion for 3 minutes. The area on your body  should be completely wet with CHG when you are done scrubbing. 2. Do not rinse. Discard the cloth and let the area air-dry. Do not put any substances on the area afterward, such as powder, lotion, or perfume. 3. Put on clean clothes or pajamas. 4. If it is the night before your surgery, sleep in clean sheets.  For general bathing Follow these steps when using CHG cloths for general bathing (unless your health care provider gives you different instructions). 1. Use a separate CHG cloth for each area of your body. Make sure you wash between any folds of skin and between your fingers and toes. Wash your body in the following order, switching to a new cloth after each step: ? The front of your neck, shoulders, and chest. ? Both of your arms, under your arms, and your hands. ? Your stomach and groin area, avoiding the genitals. ? Your right leg and foot. ? Your left leg and foot. ? The back of your neck, your back, and your buttocks. 2. Do not rinse. Discard the cloth and let the area air-dry. Do not put any substances on your body afterward--such as powder, lotion, or perfume--unless you are told to  do so by your health care provider. Only use lotions that are recommended by the manufacturer. 3. Put on clean clothes or pajamas. Contact a health care provider if:  Your skin gets irritated after scrubbing.  You have questions about using your solution or cloth. Get help right away if:  Your eyes become very red or swollen.  Your eyes itch badly.  Your skin itches badly and is red or swollen.  Your hearing changes.  You have trouble seeing.  You have swelling or tingling in your mouth or throat.  You have trouble breathing.  You swallow any chlorhexidine. Summary  Chlorhexidine gluconate (CHG) is a germ-killing (antiseptic) solution that is used to clean the skin. Cleaning your skin with CHG may help to lower your risk for infection.  You may be given CHG to use for bathing. It may be in a bottle or in a prepackaged cloth to use on your skin. Carefully follow your health care provider's instructions and the instructions on the product label.  Do not use CHG if you have a chlorhexidine allergy.  Contact your health care provider if your skin gets irritated after scrubbing. This information is not intended to replace advice given to you by your health care provider. Make sure you discuss any questions you have with your health care provider. Document Revised: 01/09/2019 Document Reviewed: 09/20/2017 Elsevier Patient Education  [place].

## 2020-03-23 ENCOUNTER — Telehealth: Payer: Self-pay | Admitting: Orthopedic Surgery

## 2020-03-23 ENCOUNTER — Ambulatory Visit: Payer: BC Managed Care – PPO | Attending: Internal Medicine

## 2020-03-23 DIAGNOSIS — Z23 Encounter for immunization: Secondary | ICD-10-CM

## 2020-03-23 NOTE — Progress Notes (Signed)
   Covid-[facility] Vaccination [facility]  Name:  David Knox    MRN: [id number] DOB: Sep 13, 1966  03/23/2020  [name] was observed post Covid-19 immunization for 15 minutes without incident. He was provided with Vaccine Information Sheet and instruction to access the V-Safe system.   Mr. Cross was instructed to call 911 with any severe reactions post vaccine: Marland Kitchen Difficulty breathing  . Swelling of face and throat  . A fast heartbeat  . A bad rash all over body  . Dizziness and weakness   Immunizations Administered    Name Date Dose VIS Date Route   Moderna COVID-19 Vaccine 03/23/2020 11:36 AM 0.5 mL 10/2019 Intramuscular   Manufacturer: Moderna   Lot: [id number]   NDC: 33612-244-97

## 2020-03-23 NOTE — Telephone Encounter (Signed)
Patient called to relay that he will be unable to have his total hip surgery done on 03/30/20, due to daughter being away and therefore unable to assist him at home for his after-care. States he is trying to find out which week he can reschedule it based on her schedule. Please call him at [phone].

## 2020-03-23 NOTE — Telephone Encounter (Signed)
Left message for him to call back so we can discuss  Asked Eber Jones to pull into [employer] and I will let her know when he decides to [facility]

## 2020-03-23 NOTE — Telephone Encounter (Signed)
Patient returned call, said sorry to have missed. Still checking with daughter regarding possible date 04/27/20 or 05/04/20 for reschedule of surgery.  States leaving for work (by 2:30pm); please call back before 2;30 tomorrow.

## 2020-03-24 ENCOUNTER — Encounter (HOSPITAL_COMMUNITY)
Admission: RE | Admit: 2020-03-24 | Discharge: 2020-03-24 | Disposition: A | Discharge status: Home / Self Care | Payer: BC Managed Care – PPO | Source: Ambulatory Visit | Attending: Orthopedic Surgery | Admitting: Orthopedic Surgery

## 2020-03-24 NOTE — Progress Notes (Signed)
Patient Had to cancel surgery. No one let me know, or cancelled his COVID test appt.. Pt said he is waiting for them ([facility] or the surgeon's office.) to call and reschedule.

## 2020-03-24 NOTE — Telephone Encounter (Signed)
Called him he wants June 22nd  Have advised Eber Jones

## 2020-03-26 ENCOUNTER — Encounter (HOSPITAL_COMMUNITY): Payer: Self-pay | Admitting: *Deleted

## 2020-03-26 ENCOUNTER — Other Ambulatory Visit (HOSPITAL_COMMUNITY): Payer: BC Managed Care – PPO

## 2020-03-26 ENCOUNTER — Emergency Department (HOSPITAL_COMMUNITY): Payer: BC Managed Care – PPO

## 2020-03-26 ENCOUNTER — Emergency Department (HOSPITAL_COMMUNITY)
Admission: EM | Admit: 2020-03-26 | Discharge: 2020-03-26 | Disposition: A | Discharge status: Home / Self Care | Payer: BC Managed Care – PPO | Attending: Emergency Medicine | Admitting: Emergency Medicine

## 2020-03-26 DIAGNOSIS — Z79899 Other long term (current) drug therapy: Secondary | ICD-10-CM | POA: Insufficient documentation

## 2020-03-26 DIAGNOSIS — Y999 Unspecified external cause status: Secondary | ICD-10-CM | POA: Insufficient documentation

## 2020-03-26 DIAGNOSIS — Y929 Unspecified place or not applicable: Secondary | ICD-10-CM | POA: Diagnosis not present

## 2020-03-26 DIAGNOSIS — F17210 Nicotine dependence, cigarettes, uncomplicated: Secondary | ICD-10-CM | POA: Insufficient documentation

## 2020-03-26 DIAGNOSIS — Y939 Activity, unspecified: Secondary | ICD-10-CM | POA: Insufficient documentation

## 2020-03-26 DIAGNOSIS — S2231XA Fracture of one rib, right side, initial encounter for closed fracture: Secondary | ICD-10-CM | POA: Insufficient documentation

## 2020-03-26 DIAGNOSIS — S299XXA Unspecified injury of thorax, initial encounter: Secondary | ICD-10-CM | POA: Diagnosis not present

## 2020-03-26 MED ORDER — NAPROXEN 500 MG PO TABS
500.0000 mg | ORAL_TABLET | Freq: Two times a day (BID) | ORAL | 0 refills | Status: DC | PRN
Start: 2020-03-26 — End: 2021-04-11

## 2020-03-26 MED ORDER — HYDROCODONE-ACETAMINOPHEN 5-325 MG PO TABS
1.0000 | ORAL_TABLET | Freq: Once | ORAL | Status: AC
  Administered 2020-03-26: 1 via ORAL
  Filled 2020-03-26: qty 1

## 2020-03-26 MED ORDER — HYDROCODONE-ACETAMINOPHEN 5-325 MG PO TABS: 1.0000 | ORAL_TABLET | Freq: Four times a day (QID) | ORAL | 0 refills | Status: DC | PRN

## 2020-03-26 MED ORDER — LIDOCAINE 5 % EX PTCH: 1.0000 | MEDICATED_PATCH | CUTANEOUS | 0 refills | Status: DC

## 2020-03-26 NOTE — Discharge Instructions (Addendum)
You were seen in the emergency department today after an injury to your ribs.  Your x-ray shows that your eighth rib is fractured.  We are sending you home with the following medicines to help with this:  - Naproxen is a nonsteroidal anti-inflammatory medication that will help with pain and swelling. Be sure to take this medication as prescribed with food, 1 pill every 12 hours,  It should be taken with food, as it can cause stomach upset, and more seriously, stomach bleeding. Do not take other nonsteroidal anti-inflammatory medications with this such as Advil, Motrin, Aleve, Mobic, Goodie Powder, or Motrin.    -Norco-this is a narcotic/controlled substance medication that has potential addicting qualities.  We recommend that you take 1-2 tablets every 6 hours as needed for severe pain.  Do not drive or operate heavy machinery when taking this medicine as it can be sedating. Do not drink alcohol or take other sedating medications when taking this medicine for safety reasons.  Keep this out of reach of small children.  Please be aware this medicine has Tylenol in it (325 mg/tab) do not exceed the maximum dose of Tylenol in a day per over the counter recommendations should you decide to supplement with Tylenol over the counter.   - Lidoderm patch-apply 1 patch to your area of most significant pain once per day, remove patch within 12 hours.  We have prescribed you new medication(s) today. Discuss the medications prescribed today with your pharmacist as they can have adverse effects and interactions with your other medicines including over the counter and prescribed medications. Seek medical evaluation if you start to experience new or abnormal symptoms after taking one of these medicines, seek care immediately if you start to experience difficulty breathing, feeling of your throat closing, facial swelling, or rash as these could be indications of a more serious allergic reaction  We are also sending you home  with an incentive spirometer, please use this 5 times per day, this is to help prevent pneumonia.  Please follow attached rib fracture care instructions.  Please follow-up with your primary care provider for reevaluation within 1 week.  Return to the ER for new or worsening symptoms including but not limited to worsening pain, trouble breathing, coughing up blood, coughing up thick mucus, fever, or any other concerns.  [facility] Primary Care Doctor List    Kari Baars MD. Specialty: Pulmonary Disease Contact information: [address]  [address]  [place] [address]  [phone]   Syliva Overman, MD. Specialty: [facility] Contact information: [address]  [place] [address]  [phone]   Lilyan Punt, MD. Specialty: [facility] Medicine Contact information: [address]  [place] [address]  [phone]   Avon Gully, MD Specialty: Internal Medicine Contact information: [address] [place] [address]  [phone]   Catalina Pizza, MD. Specialty: Internal Medicine Contact information: [address]  [place] [address]  [phone]    [facility] (Dr. Selena Batten) Specialty: Family Medicine Contact information: [address]  [place] [address]  [phone]   John Giovanni, MD. Specialty: [facility] Medicine Contact information: [address]  [address]  [place] [address]  [phone]   Carylon Perches, MD. Specialty: Internal Medicine Contact information: [address]  [address]  [place] [address]  [phone]    [facility]  [address] [place], [place] [address]  [facility] offers a variety of basic health services.  Services include but are not limited to: Blood pressure checks  Heart rate checks  Blood sugar checks  Urine analysis  Rapid strep tests  Pregnancy tests.   Health education and referrals  People needing more complex services will be directed to a physician online. Using these virtual visits, doctors can evaluate and prescribe medicine and treatments. There will be no medication on-site, though [facility] will help patients fill their prescriptions at little to no cost.   For More information please go to: [email or web address]
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
## 2020-04-14 ENCOUNTER — Ambulatory Visit: Payer: BC Managed Care – PPO | Admitting: Orthopedic Surgery

## 2020-04-19 NOTE — Patient Instructions (Signed)
Your procedure is scheduled on: 04/27/2020  Report to [facility] at    6:15 AM.  Call this number if you have problems the morning of surgery: [phone]   Remember:   Do not Eat or Drink after midnight         No Smoking the morning of surgery  :  Take these medicines the morning of surgery with A SIP OF WATER: hydrocodone if needed   Do not wear jewelry, make-up or nail polish.  Do not wear lotions, powders, or perfumes. You may wear deodorant.  Do not shave 48 hours prior to surgery. Men may shave face and neck.  Do not bring valuables to the hospital.  Contacts, dentures or bridgework may not be worn into surgery.  Leave suitcase in the car. After surgery it may be brought to your room.  For patients admitted to the hospital, checkout time is 11:00 AM the day of discharge.   Patients discharged the day of surgery will not be allowed to drive home.    Special Instructions: Shower using CHG night before surgery and shower the day of surgery use CHG.  Use special wash - you have one bottle of CHG for all showers.  You should use approximately 1/2 of the bottle for each shower.  Total Hip Replacement  Total hip replacement is a surgery to replace your damaged hip joint. Your hip joint is replaced with a man-made (artificial) hip joint. This man-made hip joint is called a prosthesis. This surgery is done to lessen pain and to help your hip move better. What happens before the procedure? Staying hydrated Follow instructions from your doctor about drinking fluids. This may include:  Up to 2 hours before surgery - you may keep drinking clear liquids. These include: ? Water. ? Clear fruit juice. ? Black coffee. ? Plain tea. Eating and drinking restrictions Follow instructions from your doctor about eating and drinking. These may include:  8 hours before surgery - stop eating heavy meals or foods. These include meat, fried foods, and fatty foods.  6 hours before surgery - stop  eating light meals or foods. These include toast and cereal.  6 hours before surgery - stop drinking milk or drinks that have milk in them.  2 hours before surgery - stop drinking clear liquids. Medicines Ask your doctor about:  Changing or stopping your normal medicines. This is important if you take diabetes medicines or blood thinners.  Taking medicines such as aspirin and ibuprofen. These can thin your blood. Do not take these medicines unless your doctor tells you to take them.  Taking over-the-counter medicines, vitamins, herbs, and supplements. General instructions  You may have a physical exam.  You may have tests, such as: ? X-rays or MRI. ? Blood or urine tests.  Plan to have someone take you home.  Plan to have someone you trust take care of you for at least 24 hours after you leave the hospital or clinic. This is important.  Prepare your home so you can be safe and have easy access to what you need.  Keep your body and teeth clean. Germs from anywhere in your body can infect your new joint. Tell your doctor if: ? You plan to have dental care and routine cleanings. ? You get any skin infections.  Avoid shaving your legs just before surgery. If any shaving is needed, it will be done in the hospital.  Ask your doctor how your surgical site will be marked  or identified. What happens during the procedure?  To lower your risk of infection: ? Your health care team will wash or sanitize their hands. ? Hair may be removed from the surgical area. ? Your skin will be washed with soap.  An IV tube will be put into one of your veins.  You will be given one or more of the following: ? A medicine to help you relax (sedative). ? A medicine to make you fall asleep (general anesthetic). ? A medicine to numb your body below the waist (spinal anesthetic).  Your doctor will make a cut (incision) in your hip. The place where the cut is made will depend on the approach used by  the doctor: ? Posterior approach. The cut will be at the back of the hip. ? Anterior approach. The cut will be at the front of the hip.  Then, your doctor will: ? Use his or her hands to move your hip out of position (dislocate it). ? Cut and take out damaged parts of your hip joint. ? Put a man-made hip joint into place. ? Do an X-ray of the hip joint to make sure it is in the right place. ? Place a drain to remove extra fluid, if needed. ? Close the cut and place a bandage (dressing) over it. The procedure may vary among doctors and hospitals. What happens after the procedure?  Your health care team will: ? Monitor you until you leave the hospital. ? Check your blood pressure, heart rate, breathing rate, and blood oxygen level. ? Check if you can move your foot and can feel sensations in it. ? Give you pain medicine.  Your doctor will tell you to take actions to help prevent blood clots and reduce swelling in your legs. You may need to: ? Wear a type of socks that are tight (compression stockings). ? Take medicines to thin your blood (anticoagulants).  You will do exercises (physical therapy) until you are doing well. Your doctor will tell you when you are well enough to go home.  You may need to use a walker or crutches.  You may need to use a wedge pillow (hip abduction pillow) when you are in bed. This pillow will keep your legs from turning in ways that may cause your new hip joint to move out of place. Summary  Total hip replacement is a surgery to replace your damaged hip joint. Your hip joint is replaced with a man-made (artificial) hip joint.  Follow instructions from your doctor about eating and drinking before the procedure.  Plan to have someone take you home from the hospital.  You may need to use a walker or crutches after surgery. This information is not intended to replace advice given to you by your health care provider. Make sure you discuss any questions you  have with your health care provider. Document Revised: 03/03/2019 Document Reviewed: 12/04/2017 Elsevier Patient Education  2020 Elsevier Inc.  Total Hip Replacement, Anterior, Care After This sheet gives you information about how to care for yourself after your procedure. Your doctor may also give you more specific instructions. If you have problems or questions, contact your doctor. What can I expect after the procedure? After the procedure, it is common to have:  Pain.  Stiffness.  Discomfort. Follow these instructions at home: Medicines  Take over-the-counter and prescription medicines only as told by your doctor.  If you were prescribed a medicine to thin your blood (anticoagulant), take it as told  by your doctor. Surgery cut care   Follow instructions from your doctor about how to take care of your cut (incision) from surgery. Make sure you: ? Wash your hands with soap and water before you change your bandage (dressing). If you cannot use soap and water, use alcohol-based hand sanitizer. ? Change your bandage as told by your doctor. ? Leave stitches (sutures), skin glue, or skin tape (adhesive) strips in place. They may need to stay in place for 2 weeks or longer. If tape strips get loose and curl up, you may trim the loose edges. Do not remove tape strips completely unless your doctor tells you to do that.  Check your surgical cut every day for signs of infection. Check for: ? Redness, swelling, or pain. ? Fluid or blood. ? Warmth. ? Pus or a bad smell. Bathing  Do not take baths, swim, or use a hot tub until your doctor says it is okay.  Keep the bandage dry until your doctor says it can be removed. Managing pain, stiffness, and swelling   If directed, put ice on the hip area. ? Put ice in a plastic bag. ? Place a towel between your skin and the bag. ? Leave the ice on for 20 minutes, 2-3 times a day.  Move your toes often to avoid stiffness and to lessen  swelling.  Raise (elevate) your leg above the level of your heart while you are sitting or lying down. Activity  Rest as told by your doctor.  Do not sit for a long time without moving. Get up to take short walks every 1-2 hours. This is important. Ask for help if you feel weak or unsteady.  Do exercises as told by your doctor or physical therapist.  Use a walker, crutches, or a cane as told by your doctor. ? You may use your legs to support (bear) your body weight as told by your doctor. Follow instructions about how much weight you may safely support on your affected leg (weight-bearing restrictions). ? A physical therapist may show you how to get out of a bed and chair and how to go up and down stairs. You will first do this with a walker, crutches, or a cane. Then you will do it without any of these devices. ? Once you are able to walk without a limp, you may stop using a walker, crutches, or cane.  Return to your normal activities as told by your doctor. Ask your doctor what activities are safe for you. Safety  To help prevent falls: ? Keep floors clear of objects you may trip over. ? Place items that you may need within easy reach.  Wear an apron or tool belt with pockets for carrying objects. This leaves your hands free to help with your balance. Driving  Do not drive or use heavy machinery while taking prescription pain medicine.  Ask your doctor when it is safe to drive. General instructions  Wear compression stockings as told by your doctor. These help to prevent blood clots and reduce swelling in your legs.  Keep doing breathing exercises as told by your doctor. This helps prevent lung infection.  If you are taking prescription pain medicine, take actions to prevent or treat constipation. Your doctor may suggest that you: ? Drink enough fluid to keep your pee (urine) pale yellow. ? Eat foods that are high in fiber. These include fresh fruits and vegetables, whole  grains, and beans. ? Limit foods that are high in  fat and sugar. These include fried or sweet foods. ? Take an over-the-counter or prescription medicine for constipation.  Do not use any products that have nicotine or tobacco in them, such as cigarettes and e-cigarettes. These can delay bone healing. If you need help quitting, ask your doctor.  Tell your doctor if you plan to have dental work. Also: ? Tell your dentist about your joint replacement. ? Ask your doctor if there are instructions you need to follow before dental care and routine cleanings.  Keep all follow-up visits as told by your doctor. This is important. Contact a doctor if:  You have a fever or chills.  You have a cough.  You feel short of breath.  Your medicine is not helping your pain.  You have any of these at or near your cut from surgery: ? Redness, swelling, or pain. ? Fluid or blood. ? An area that feels warm when you touch it. ? Pus or a bad smell. Get help right away if:  You have very bad pain.  You have trouble breathing.  You have chest pain.  You have redness, swelling, pain, and warmth in your calf or leg. Summary  Follow instructions from your doctor about how to take care of your surgery cut (incision).  Do not take baths, swim, or use a hot tub until your doctor says it is okay.  Use crutches, a walker, or a cane as told by your doctor.  If you were prescribed a medicine to thin your blood (anticoagulant), take it as told by your doctor. This information is not intended to replace advice given to you by your health care provider. Make sure you discuss any questions you have with your health care provider. Document Revised: 03/03/2019 Document Reviewed: 02/06/2018 Elsevier Patient Education  2020 Elsevier Inc.  General Anesthesia, Adult, Care After This sheet gives you information about how to care for yourself after your procedure. Your health care provider may also give you more  specific instructions. If you have problems or questions, contact your health care provider. What can I expect after the procedure? After the procedure, the following side effects are common:  Pain or discomfort at the IV site.  Nausea.  Vomiting.  Sore throat.  Trouble concentrating.  Feeling cold or chills.  Weak or tired.  Sleepiness and fatigue.  Soreness and body aches. These side effects can affect parts of the body that were not involved in surgery. Follow these instructions at home:  For at least 24 hours after the procedure:  Have a responsible adult stay with you. It is important to have someone help care for you until you are awake and alert.  Rest as needed.  Do not: ? Participate in activities in which you could fall or become injured. ? Drive. ? Use heavy machinery. ? Drink alcohol. ? Take sleeping pills or medicines that cause drowsiness. ? Make important decisions or sign legal documents. ? Take care of children on your own. Eating and drinking  Follow any instructions from your health care provider about eating or drinking restrictions.  When you feel hungry, start by eating small amounts of foods that are soft and easy to digest (bland), such as toast. Gradually return to your regular diet.  Drink enough fluid to keep your urine pale yellow.  If you vomit, rehydrate by drinking water, juice, or clear broth. General instructions  If you have sleep apnea, surgery and certain medicines can increase your risk for breathing problems. Follow  instructions from your health care provider about wearing your sleep device: ? Anytime you are sleeping, including during daytime naps. ? While taking prescription pain medicines, sleeping medicines, or medicines that make you drowsy.  Return to your normal activities as told by your health care provider. Ask your health care provider what activities are safe for you.  Take over-the-counter and prescription  medicines only as told by your health care provider.  If you smoke, do not smoke without supervision.  Keep all follow-up visits as told by your health care provider. This is important. Contact a health care provider if:  You have nausea or vomiting that does not get better with medicine.  You cannot eat or drink without vomiting.  You have pain that does not get better with medicine.  You are unable to pass urine.  You develop a skin rash.  You have a fever.  You have redness around your IV site that gets worse. Get help right away if:  You have difficulty breathing.  You have chest pain.  You have blood in your urine or stool, or you vomit blood. Summary  After the procedure, it is common to have a sore throat or nausea. It is also common to feel tired.  Have a responsible adult stay with you for the first 24 hours after general anesthesia. It is important to have someone help care for you until you are awake and alert.  When you feel hungry, start by eating small amounts of foods that are soft and easy to digest (bland), such as toast. Gradually return to your regular diet.  Drink enough fluid to keep your urine pale yellow.  Return to your normal activities as told by your health care provider. Ask your health care provider what activities are safe for you. This information is not intended to replace advice given to you by your health care provider. Make sure you discuss any questions you have with your health care provider. Document Revised: 10/26/2017 Document Reviewed: 06/08/2017 Elsevier Patient Education  2020 [employer].

## 2020-04-21 ENCOUNTER — Encounter (HOSPITAL_COMMUNITY)
Admission: RE | Admit: 2020-04-21 | Discharge: 2020-04-21 | Disposition: A | Discharge status: Home / Self Care | Payer: BC Managed Care – PPO | Source: Ambulatory Visit | Attending: Orthopedic Surgery | Admitting: Orthopedic Surgery

## 2020-04-21 ENCOUNTER — Encounter (HOSPITAL_COMMUNITY): Payer: Self-pay

## 2020-04-22 ENCOUNTER — Other Ambulatory Visit: Payer: Self-pay | Admitting: Orthopedic Surgery

## 2020-04-22 ENCOUNTER — Telehealth: Payer: Self-pay | Admitting: Orthopedic Surgery

## 2020-04-22 ENCOUNTER — Telehealth: Payer: Self-pay | Admitting: Radiology

## 2020-04-22 DIAGNOSIS — M1611 Unilateral primary osteoarthritis, right hip: Secondary | ICD-10-CM

## 2020-04-22 NOTE — Telephone Encounter (Signed)
We received a voicemail from [facility] stating that [name] did not keep his pre op appointment.  He does have a COVID test scheduled for tomorrow.  She just wanted to let you know

## 2020-04-22 NOTE — Telephone Encounter (Signed)
Thanks

## 2020-04-22 NOTE — Telephone Encounter (Signed)
Regarding [facility], I took phone message from [employer] representative Elon Jester B, who relays taht patient's  surgery has been approved as of 04/19/20. I asked for a faxed authorization/approval, as we do not show a fax has been received -she said she will "re-fax."  I asked for the authorization number and was given: [id number], for dates 04/27/20 to 04/29/20.

## 2020-04-22 NOTE — Telephone Encounter (Signed)
You stated approval was given for the surgery Did you get authorization number? I need to put in system before the pre service center calls  Me, calls Toniann Fail, calls Minerva Areola, and sends Minerva Areola an Agricultural engineer, please.

## 2020-04-23 ENCOUNTER — Encounter (HOSPITAL_COMMUNITY)
Admission: RE | Admit: 2020-04-23 | Discharge: 2020-04-23 | Disposition: A | Discharge status: Home / Self Care | Payer: BC Managed Care – PPO | Source: Ambulatory Visit | Attending: Orthopedic Surgery | Admitting: Orthopedic Surgery

## 2020-04-23 ENCOUNTER — Other Ambulatory Visit: Payer: Self-pay

## 2020-04-23 ENCOUNTER — Encounter (HOSPITAL_COMMUNITY): Payer: Self-pay

## 2020-04-23 ENCOUNTER — Other Ambulatory Visit (HOSPITAL_COMMUNITY)
Admission: RE | Admit: 2020-04-23 | Discharge: 2020-04-23 | Disposition: A | Discharge status: Home / Self Care | Payer: BC Managed Care – PPO | Source: Ambulatory Visit | Attending: Orthopedic Surgery | Admitting: Orthopedic Surgery

## 2020-04-23 DIAGNOSIS — I498 Other specified cardiac arrhythmias: Secondary | ICD-10-CM | POA: Insufficient documentation

## 2020-04-23 DIAGNOSIS — F17200 Nicotine dependence, unspecified, uncomplicated: Secondary | ICD-10-CM | POA: Insufficient documentation

## 2020-04-23 DIAGNOSIS — Z20822 Contact with and (suspected) exposure to covid-19: Secondary | ICD-10-CM | POA: Diagnosis not present

## 2020-04-23 DIAGNOSIS — Z87898 Personal history of other specified conditions: Secondary | ICD-10-CM | POA: Diagnosis not present

## 2020-04-23 DIAGNOSIS — Z01818 Encounter for other preprocedural examination: Secondary | ICD-10-CM | POA: Diagnosis not present

## 2020-04-23 LAB — TYPE AND SCREEN
ABO/RH(D): B POS
Antibody Screen: NEGATIVE

## 2020-04-23 LAB — BASIC METABOLIC PANEL
Anion gap: 10 (ref 5–15)
BUN: 12 mg/dL (ref 6–20)
CO2: 28 mmol/L (ref 22–32)
Calcium: 9.2 mg/dL (ref 8.9–10.3)
Chloride: 103 mmol/L (ref 98–111)
Creatinine, Ser: 1.05 mg/dL (ref 0.61–1.24)
GFR calc Af Amer: >60 mL/min (ref >60)
GFR calc non Af Amer: >60 mL/min (ref >60)
Glucose, Bld: 107 mg/dL — ABNORMAL HIGH (ref 70–99)
Potassium: 3.9 mmol/L (ref 3.5–5.1)
Sodium: 141 mmol/L (ref 135–145)

## 2020-04-23 LAB — CBC WITH DIFFERENTIAL/PLATELET
Abs Immature Granulocytes: 0.03 10*3/uL (ref 0.00–0.07)
Basophils Absolute: 0 10*3/uL (ref 0.0–0.1)
Basophils Relative: 0 %
Eosinophils Absolute: 0.1 10*3/uL (ref 0.0–0.5)
Eosinophils Relative: 1 %
HCT: 44.9 % (ref 39.0–52.0)
Hemoglobin: 14.5 g/dL (ref 13.0–17.0)
Immature Granulocytes: 1 %
Lymphocytes Relative: 23 %
Lymphs Abs: 1.5 10*3/uL (ref 0.7–4.0)
MCH: 31.9 pg (ref 26.0–34.0)
MCHC: 32.3 g/dL (ref 30.0–36.0)
MCV: 98.7 fL (ref 80.0–100.0)
Monocytes Absolute: 0.5 10*3/uL (ref 0.1–1.0)
Monocytes Relative: 8 %
Neutro Abs: 4.4 10*3/uL (ref 1.7–7.7)
Neutrophils Relative %: 67 %
Platelets: 300 10*3/uL (ref 150–400)
RBC: 4.55 MIL/uL (ref 4.22–5.81)
RDW: 13.2 % (ref 11.5–15.5)
WBC: 6.6 10*3/uL (ref 4.0–10.5)
nRBC: 0 % (ref 0.0–0.2)

## 2020-04-23 LAB — PREPARE RBC (CROSSMATCH)

## 2020-04-23 LAB — ABO/RH: ABO/RH(D): B POS

## 2020-04-24 LAB — SARS CORONAVIRUS 2 (TAT 6-24 HRS): SARS Coronavirus 2: NEGATIVE

## 2020-04-26 ENCOUNTER — Telehealth: Payer: Self-pay | Admitting: Orthopedic Surgery

## 2020-04-26 ENCOUNTER — Encounter: Payer: Self-pay | Admitting: Orthopedic Surgery

## 2020-04-26 NOTE — H&P (Signed)
[place]       Chief Complaint  Patient presents with  . 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  He has tried anti-inflammatories got some relief with steroid Dosepak   Otherwise very healthy     Review of Systems  Musculoskeletal: Positive for joint pain.       Joint pain right knee seems to be coming from the hip.  All other systems reviewed and are negative.           Past Medical History:  Diagnosis Date  . Acid reflux             Past Surgical History:  Procedure Laterality Date  . ESOPHAGOGASTRODUODENOSCOPY   2002    RMR: ulcerative reflux esophagitis, moderate sized hiatal hernia  . HIP SURGERY      . WRIST SURGERY               Family History  Problem Relation Age of Onset  . Diabetes Mother    . Heart disease Mother    . Diabetes Father    . Heart disease Father    . Colon cancer Neg Hx      Social History         Tobacco Use  . Smoking status: Current Every Day Smoker      Packs/day: 1.00      Types: Cigarettes  . Smokeless tobacco: Never Used  Substance Use Topics  . Alcohol use: Yes      Comment: beer and liquor twice weekly  . Drug use: No      Comment: "crack" former      No Known Allergies     Active Medications      Current Meds  Medication Sig  . Cimetidine (ACID REDUCER PO) Take 1 tablet by mouth 2 (two) times daily. OTC  . pantoprazole (PROTONIX) 40 MG tablet Take 1 tablet (40 mg total) by mouth daily before  breakfast.        BP 139/84   Pulse 71   Ht 5' 10.5" (1.791 m)   Wt 180 lb (81.6 kg)   BMI 25.46 kg/m    Physical Exam Gait unsupported  Normal endormorphic body  No deformities aao x 3   Mood pleasant  Affect normal    Ortho Exam   I did not detect a limp he has positive foot progression angles.  Normal stride length and cadence   Leg lengths appear to be equal   His left hip flexes 125 degrees he has good abduction and abduction arc of total 50 degrees combined with internal rotation of 0 degrees external rotation 30 degrees   Right hip painful flexion at 125 degrees his abduction abduction arc is 50 degrees his internal rotation is 0 degrees and is very painful external rotation 30 degrees   No hip flexion contracture   2+ dorsalis pedis pulses normal color  and capillary refill both lower extremities   Normal sensation in both lower extremities as well     MEDICAL DECISION SECTION  xrays ordered?  AP pelvis shows equal leg lengths pistol-grip deformity bilaterally cyst formation abnormal head contours external rotation of the epiphyseal region bilaterally the right side is worse than the left   My independent reading of xrays: The AP and lateral of the right hip confirm the arthritis and head is normal in terms of sphericity but cyst formation is noted femoral head femoral neck offset is decreased as well with pistol-grip deformity   Classic post Skippy deformities             Encounter Diagnoses  Name Primary?  . Nicotine dependence, cigarettes, uncomplicated Yes  . Chronic pain of right hip    . Arthritis of right hip          PLAN:   RIGHT TOTAL HIP    Social setting, example stairs, help at home (patient's only stay in the hospital one day), his daughter or one of his other 3 children and ex-wife have agreed to help him at home   There are no steps to get in the home and there are no steps in the home itself.   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   He does have difficulty squatting at work getting up out of a seated position and getting in and out of his car     Risk of surgery: Bleeding infection risk of amputation DVT pulmonary embolus stiffness continued postop pain  Alternative treatments: Continue with nonoperative care such as but not limited to Tylenol anti-inflammatories tramadol topical medications bracing chronic pain management  Patient factors that increase risk: Do you have diabetes is your A1c normal did have a bleeding history  Pain management: Medication will be tapered off at 6 weeks an alternative measures will be needed to control pain  Postoperatively: Physical therapy will be set up, you have a CPM machine, you have an ice cuff, you will have DVT prevention, you will wear stockings for 4 weeks.  Bedside commode, shower chair, 3 in 1 chair.     Surgical procedure planned: right total hip with ceramic   Advised to stop smoking and ordered patches    Has family to help after surgery   Has no steps in the house    Expect 2 mos OOW    The procedure has been fully reviewed with the patient; The risks and benefits of surgery have been discussed and explained and understood. Alternative treatment has also been reviewed, questions were encouraged and answered. The postoperative plan is also been reviewed.   Nonsurgical treatment as described in the history and physical section was attempted and unsuccessful and the patient has agreed to proceed with surgical intervention to improve their situation.

## 2020-04-26 NOTE — Telephone Encounter (Signed)
Patient called regarding note for work and forms - states need to be updated due to change of surgery date; said had pre-op visit on 04/23/20 for surgery date 04/27/20. Aware we have an updated note regarding dates out of work, and aware of [employer] forms process.

## 2020-04-26 NOTE — Telephone Encounter (Signed)
Done

## 2020-04-27 ENCOUNTER — Encounter: Payer: Self-pay | Admitting: Orthopedic Surgery

## 2020-04-27 ENCOUNTER — Telehealth: Payer: Self-pay | Admitting: Orthopedic Surgery

## 2020-04-27 NOTE — Telephone Encounter (Signed)
Patient also came by office this afternoon to relay the work note information needed - note provided.

## 2020-04-27 NOTE — Telephone Encounter (Signed)
Call from patient, relays surgery to be rescheduled per call received from [facility] last evening - states he was told they would have no beds available in out-patient surgery. Please advise of reschedule date. Patient will also need an updated note for work indicating the updated information.

## 2020-04-29 ENCOUNTER — Other Ambulatory Visit: Payer: Self-pay | Admitting: Orthopedic Surgery

## 2020-04-29 ENCOUNTER — Telehealth: Payer: Self-pay | Admitting: Orthopedic Surgery

## 2020-04-29 NOTE — Telephone Encounter (Signed)
We are trying for July 6th.

## 2020-04-29 NOTE — Telephone Encounter (Signed)
[name] called and wanted to know if his surgery has been rescheduled.   He stated that he is on his way to work at this time but asks that you call him tomorrow after 10:00 am.  Thanks

## 2020-04-29 NOTE — Telephone Encounter (Signed)
I called him 6th not good, we are going to do it on July 13th.

## 2020-05-13 ENCOUNTER — Encounter (HOSPITAL_COMMUNITY)
Admission: RE | Admit: 2020-05-13 | Discharge: 2020-05-13 | Disposition: A | Discharge status: Home / Self Care | Payer: BC Managed Care – PPO | Source: Ambulatory Visit | Attending: Orthopedic Surgery | Admitting: Orthopedic Surgery

## 2020-05-13 NOTE — Pre-Procedure Instructions (Signed)
Interoffice messaged Amy Littrell @ [facility] office tolet her know patient was a no show for PAT.

## 2020-05-14 ENCOUNTER — Other Ambulatory Visit (HOSPITAL_COMMUNITY)
Admission: RE | Admit: 2020-05-14 | Discharge: 2020-05-14 | Disposition: A | Discharge status: Home / Self Care | Payer: BC Managed Care – PPO | Source: Ambulatory Visit | Attending: Orthopedic Surgery | Admitting: Orthopedic Surgery

## 2020-05-14 ENCOUNTER — Other Ambulatory Visit: Payer: Self-pay

## 2020-05-17 ENCOUNTER — Telehealth: Payer: Self-pay | Admitting: Radiology

## 2020-05-17 NOTE — Telephone Encounter (Signed)
*  Hold message for Amy.  Patient came by the office, said he needs to cancel surgery.  He let time slip up on him, did not go for covid or other preop testing.  I told him you would call him next week to look at rescheduling.

## 2020-05-18 ENCOUNTER — Encounter (HOSPITAL_COMMUNITY): Admission: RE | Payer: Self-pay | Source: Home / Self Care

## 2020-05-18 ENCOUNTER — Ambulatory Visit (HOSPITAL_COMMUNITY)
Admission: RE | Admit: 2020-05-18 | Payer: BC Managed Care – PPO | Source: Home / Self Care | Admitting: Orthopedic Surgery

## 2020-05-18 SURGERY — ARTHROPLASTY, HIP, TOTAL,POSTERIOR APPROACH
Anesthesia: Choice | Site: Hip | Laterality: Right

## 2020-05-24 NOTE — Telephone Encounter (Signed)
Left message for him to call me back to RS Looking at August 10th

## 2020-06-03 ENCOUNTER — Telehealth: Payer: Self-pay | Admitting: Orthopedic Surgery

## 2020-06-03 ENCOUNTER — Encounter: Payer: Self-pay | Admitting: Orthopedic Surgery

## 2020-06-03 NOTE — Telephone Encounter (Signed)
Patient asks that you call him about rescheduling his surgery.  He said he will be at work this afternoon but you can call him tomorrow morning after 10:00  Thanks

## 2020-06-03 NOTE — Telephone Encounter (Signed)
yes

## 2020-06-03 NOTE — Telephone Encounter (Signed)
[facility] Knox stopped by and wants a note to work but with no lifting.  Previously we had him return with no restrictions.  He says any heavy lifting causes his hip to hurt.    FYI, he asks that Amy call him back regarding rescheduling his surgery.  I will send her a note about this.   Ok to write the note for his work?

## 2020-06-07 NOTE — Telephone Encounter (Signed)
Due to clinic on Friday I could not call him, since I was with patients Called him to RS surgery. Mailbox is full.

## 2020-06-24 ENCOUNTER — Other Ambulatory Visit: Payer: Self-pay

## 2020-06-24 ENCOUNTER — Other Ambulatory Visit: Payer: BC Managed Care – PPO

## 2020-06-24 DIAGNOSIS — Z20822 Contact with and (suspected) exposure to covid-19: Secondary | ICD-10-CM

## 2020-06-25 LAB — NOVEL CORONAVIRUS, NAA: SARS-CoV-2, NAA: NOT DETECTED

## 2020-06-25 LAB — SARS-COV-2, NAA 2 DAY TAT

## 2020-07-12 IMAGING — DX CERVICAL SPINE - COMPLETE 4+ VIEW
5 series · 5 of 5 positions shown · non-contrast
Comparison: No comparison studies available.

CLINICAL DATA: 1 month history of right arm numbness.

EXAM:
CERVICAL SPINE - COMPLETE 4+ VIEW

[c-spine lat]
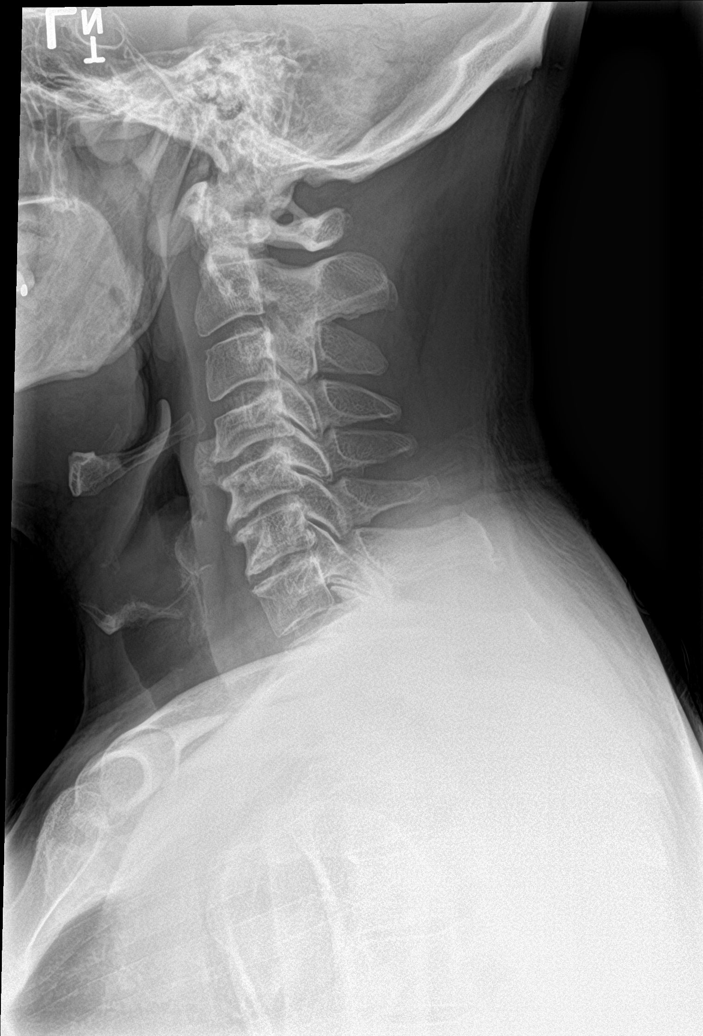

[c-spine obl (1 of 2)]
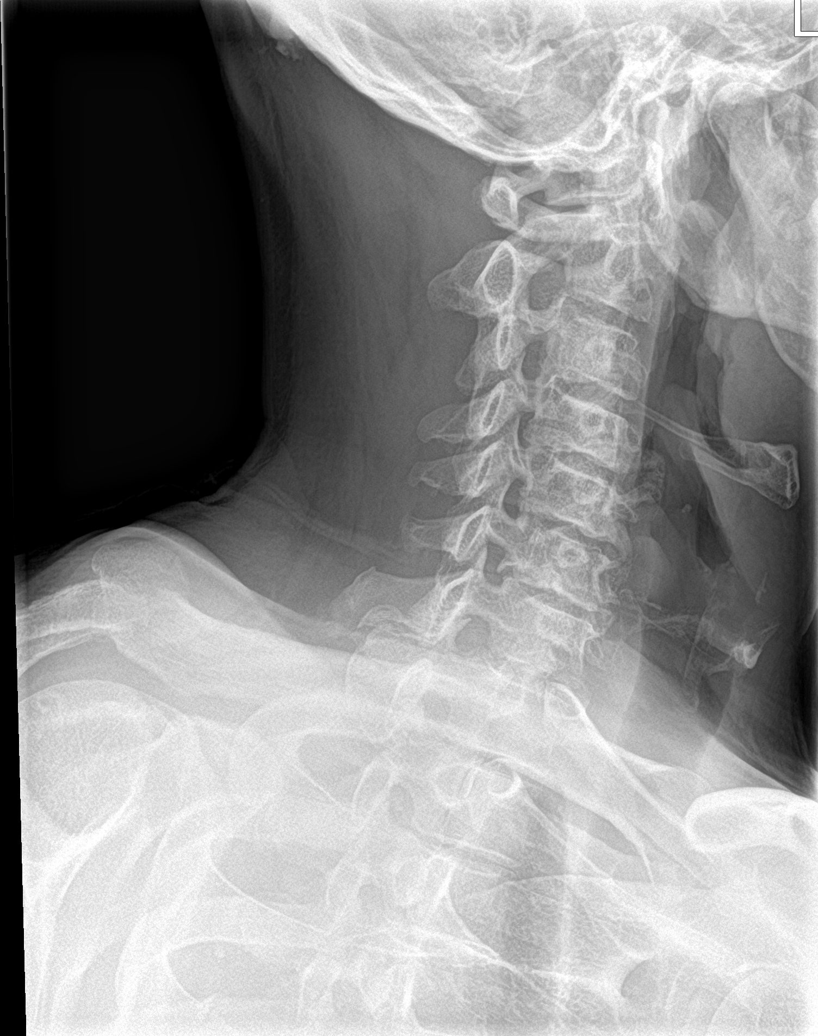

[c-spine obl (2 of 2)]
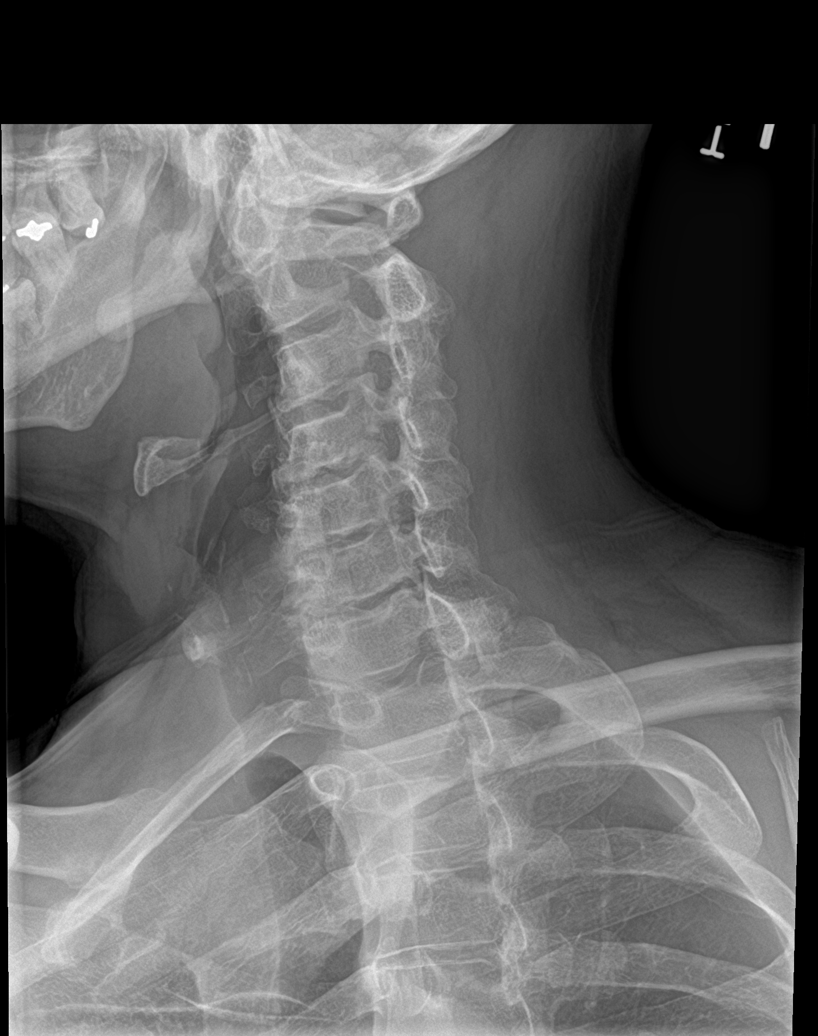

[c-spine ap]
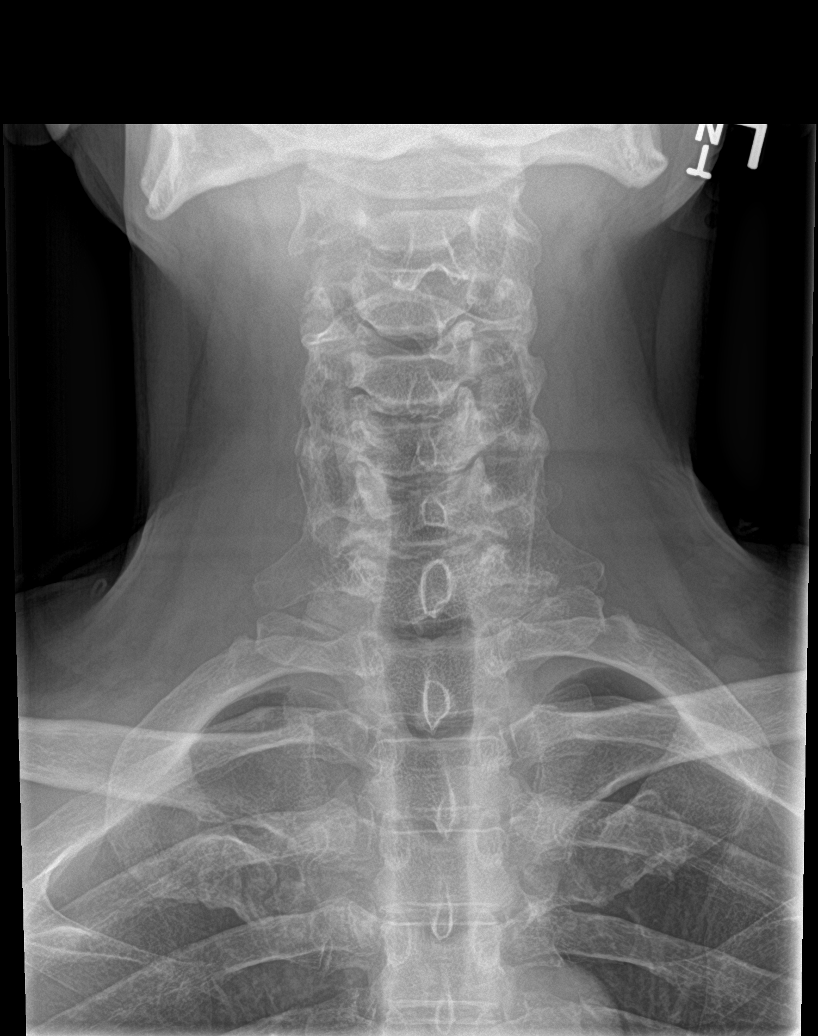

[c-spine open mouth]
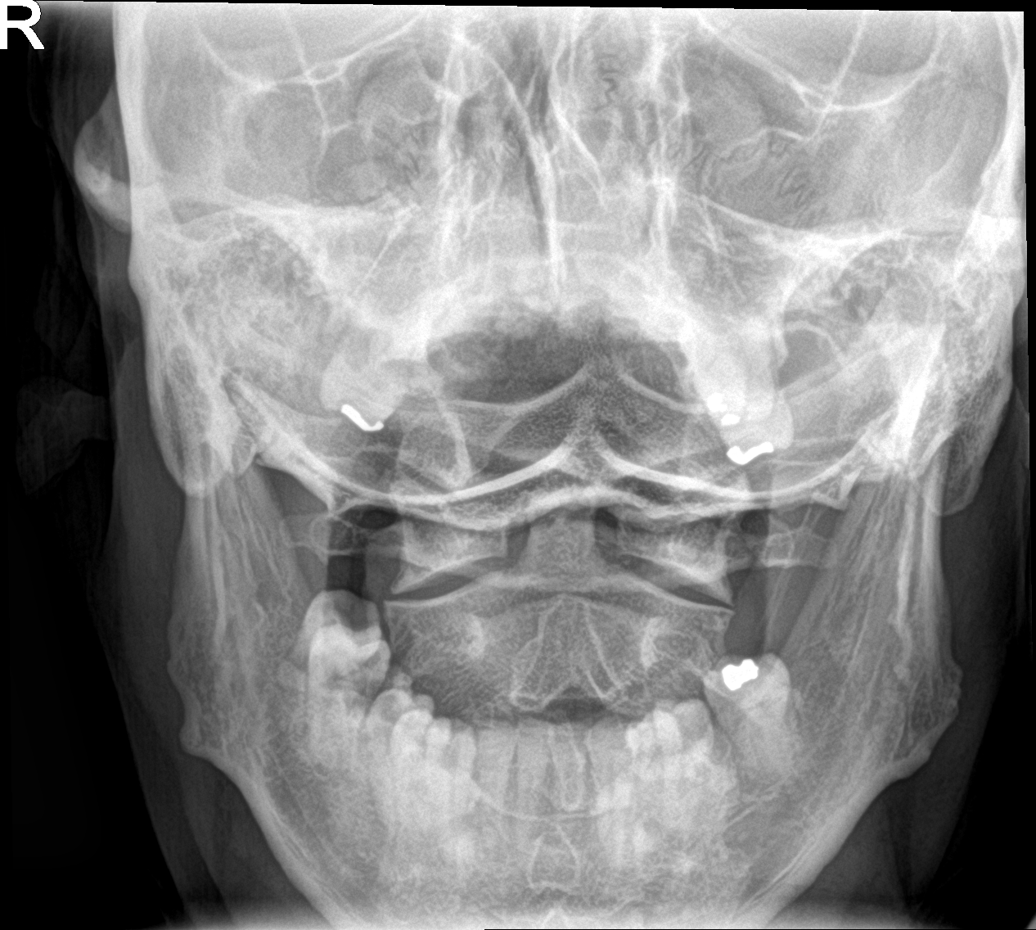

[5 of 5 positions shown; findings below may reference images not displayed]

FINDINGS: No fracture. No subluxation. Loss of disc height noted at C5-6 and
C6-7 and to a lesser degree at C4-5. Bony foraminal encroachment
noted on the right at C3-4 and C6-7. Facets are well aligned
bilaterally. No prevertebral soft tissue swelling.
IMPRESSION: Degenerative changes mid and lower cervical spine with bony
foraminal encroachment on the right at C3-4 and C6-7.

## 2020-07-20 ENCOUNTER — Emergency Department (HOSPITAL_COMMUNITY)
Admission: EM | Admit: 2020-07-20 | Discharge: 2020-07-20 | Discharge status: Against Medical Advice | Payer: BC Managed Care – PPO

## 2021-03-24 ENCOUNTER — Telehealth: Payer: Self-pay | Admitting: Orthopedic Surgery

## 2021-03-24 NOTE — Telephone Encounter (Signed)
Patient called to relay that he has moved back to the area, and states he "never did get the hip replacement surgery done."  As per chart notes, patient cancelled the total hip replacement for July of 2021.  Notes indicate that patient was contacted back to offer other dates for the surgery, with no response. Patient states he had been living out of town for [place], and is back in [place] now. Aware patient would need to be scheduled for a visit; question is does Dr Romeo Apple still perform this procedure? Please advise.  Patient is at same ph# [phone]

## 2021-03-24 NOTE — Telephone Encounter (Signed)
Called patient to relay - reached voice mail; left message.

## 2021-03-24 NOTE — Telephone Encounter (Signed)
Ok to make appointment, if we have to send him somewhere else Dr Romeo Apple will let him know and make referral  We have been sending most of the hip replacements to Dr Magnus Ivan for Anterior approach, but we can discuss what is best for patient and proceed accordingly  Thanks. / Next available ok, no need to work in.

## 2021-03-25 NOTE — Telephone Encounter (Signed)
Patient returned call; aware of appointment.

## 2021-04-11 ENCOUNTER — Encounter: Payer: Self-pay | Admitting: Orthopedic Surgery

## 2021-04-11 ENCOUNTER — Ambulatory Visit: Payer: BC Managed Care – PPO

## 2021-04-11 ENCOUNTER — Ambulatory Visit (INDEPENDENT_AMBULATORY_CARE_PROVIDER_SITE_OTHER): Payer: BC Managed Care – PPO | Admitting: Orthopedic Surgery

## 2021-04-11 ENCOUNTER — Other Ambulatory Visit: Payer: Self-pay

## 2021-04-11 VITALS — BP 143/95 | HR 76 | Ht 71.0 in | Wt 180.0 lb

## 2021-04-11 DIAGNOSIS — M1611 Unilateral primary osteoarthritis, right hip: Secondary | ICD-10-CM

## 2021-04-11 MED ORDER — NICOTINE 21 MG/24HR TD PT24
21.0000 mg | MEDICATED_PATCH | Freq: Every day | TRANSDERMAL | 0 refills | Status: DC
Start: 2021-04-11 — End: 2021-04-27

## 2021-04-11 NOTE — Patient Instructions (Addendum)
Start nicotine patch wean off tobacco   Preparing for Hip Replacement Preparing before hip replacement surgery can make recovery easier and more comfortable. The information below gives some tips and guidelines that will help to prepare for this surgery. Talk with your health care provider so you can learn what to expect before, during, and after surgery. Ask questions if you do not understand something. Tell a health care provider about:  Any allergies you have.  All medicines you are taking, including vitamins, herbs, eye drops, creams, and over-the-counter medicines.  Any problems you or family members have had with anesthetic medicines.  Any blood disorders you have.  Any surgeries you have had.  Any medical conditions you have.  Whether you are pregnant or may be pregnant. What happens before the procedure? Health care provider visits You will need to have a physical exam before you are scheduled for surgery (preoperative exam) to make sure it is safe for you to have surgery. This may require you to have more tests. When you go to the preoperative exam, bring a list of all the medicines, vitamins, herbs, and supplements that you take. Have dental care and routine cleanings done before surgery. Germs from anywhere in your body, including your mouth, can travel to your new joint and infect it. Tell your dentist that you plan to have hip replacement surgery. Surgery costs To find out how much surgery will cost, call your insurance company as soon as you decide to have surgery. Ask questions such as:  How much of the surgery and hospital stay will be covered?  What will be covered for the following items? ? Medical equipment. ? Rehabilitation facilities. ? Home care. ? Medicines. Preparing your home  Pick a recovery spot that is not your bed. During recovery, it is better that you sit more upright than you can in your bed. Here are some tips for choosing a recovery  spot: ? Choose a chair with arms and a high, firm seat that will not allow you to sink down into it. Chairs and sofas that are too soft can allow your hip to bend at an angle greater than 90 degrees. This could put you at risk for moving your new hip joint out of place (dislocating it). ? Place items that you often use on a small table next to your chair. These items may include the TV remote control, a mobile phone, a book, a laptop computer, and a water glass.  You may be given a walker to use at home. Check if you will have enough room to use a walker. Move around your home with your hands out about 6 inches (15 cm) from your sides. You will have enough room if you do not hit anything with your hands as you do this. Walk from: ? Your recovery spot to your kitchen and bathroom. ? Your bed to the bathroom.  Apply these general tips: ? Move the items you use most often to shelves and drawers at countertop height. Do this in your kitchen, bathroom, and bedroom. ? Prepare some meals to freeze and reheat later. Home safety  Remove all clutter and throw rugs from your floors. Doing this will help you avoid tripping.  Consider getting safety equipment that will be helpful during your recovery, such as: ? Grab bars in the shower and near the toilet. ? A raised toilet seat. This will help you get on and off the toilet more easily. ? A tub or shower bench.  Preparing your body  If you smoke, quit as soon as you can before surgery. If there is time, it is best to quit several months before surgery. Tell your surgeon if you use any products that contain nicotine or tobacco, such as cigarettes, e-cigarettes, and chewing tobacco. These can delay healing. If you need help quitting, ask your health care provider.  Maintain a healthy diet. Do not change your diet before surgery unless your health care provider tells you to do that.  Do not drink any alcohol for at least 48 hours before  surgery.  Talk to your health care provider about doing exercises before your surgery. ? Be sure to follow the exercise program given by your health care provider. ? Doing these exercises in the weeks before your surgery may help reduce pain and improve function after surgery. Recovery planning In the first couple of weeks after surgery, it will be harder for you to do some of your regular activities. You may get tired easily, and you may have limited movement in your leg. To make sure you have all the help you need after your surgery:  Plan to have a responsible adult take you home from the hospital. Your health care provider will tell you how many days you can expect to be in the hospital.  Cancel all of your work, caregiving, and volunteer responsibilities for at least 4-6 weeks after surgery.  Plan to have a responsible adult stay with you both day and night for the first week. This person should be someone you are comfortable with. You may need this person to help you with your exercises and personal care, such as bathing and using the toilet.  If you live alone, arrange for someone to take care of your home and pets for the first 4-6 weeks after surgery.  You may not be able to drive for 4-6 weeks. Plan to have someone drive you on errands and to appointments.  Consider applying for a disability parking permit. To get an application, call your local Department of [employer] [facility]([employer]) or your health care provider's office. Summary  Getting prepared before hip replacement surgery can make your recovery easier and more comfortable.  Keep all of your preoperative appointments to make sure that you are ready for your surgery.  Prepare your home and arrange for help at home.  Plan to have a responsible adult take you home from the hospital and stay with you day and night for the first week. This information is not intended to replace advice given to you by your health care provider. Make  sure you discuss any questions you have with your health care provider. Document Revised: 03/18/2020 Document Reviewed: 03/18/2020 Elsevier Patient Education  2021 [employer]  Total Hip Replacement Total hip replacement is a surgery to remove damaged bone in the hip joint and replace it with an artificial (prosthetic) hip joint. The hip is a ball-and-socket type of joint. It has two main parts:  Femoral head. This is the ball part of the joint and is the top of the thighbone (femur).  Acetabulum. This is the socket part of the joint. It is a large, hollow area on the outer side of the pelvis where the femur and pelvis meet. During total hip replacement, one or both parts of your hip joint are replaced, depending on the type of joint damage that you have. The purpose of this surgery is to reduce pain and improve your hip function. Common causes of  hip pain and disability associated with total hip replacement include osteoarthritis, rheumatoid arthritis, osteonecrosis, post-traumatic arthritis, and childhood hip disease. Tell a health care provider about:  Any allergies you have.  All medicines you are taking, including vitamins, herbs, eye drops, creams, and over-the-counter medicines.  Any problems you or family members have had with anesthetic medicines.  Any blood disorders you have.  Any surgeries you have had.  Any medical conditions you have.  Whether you are pregnant or may be pregnant. What are the risks? Generally, this is a safe procedure. However, problems may occur, including:  Infection.  Bleeding.  Allergic reactions to medicines.  Damage to nerves or other structures.  Problems with your man-made, or artificial, joint (prosthesis). These may include: ? Loosening or dislocation of your prosthetic joint (prosthesis). Dislocation means the joint has moved out of place.  Deep vein thrombosis. This is a blood clot that can develop in your leg and break loose and  travel to your lungs (pulmonary embolus).  A bone break (fracture), loss of movement, or lasting stiffness or pain.  Compartment syndrome. This is swelling and pressure buildup in the joint. What happens before the procedure? Staying hydrated Follow instructions from your health care provider about hydration, which may include:  Up to 2 hours before the procedure - you may continue to drink clear liquids, such as water, clear fruit juice, black coffee, and plain tea.   Eating and drinking restrictions  Follow instructions from your health care provider about eating and drinking, which may include: ? 8 hours before the procedure - stop eating heavy meals or foods, such as meat, fried foods, or fatty foods. ? 6 hours before the procedure - stop eating light meals or foods, such as toast or cereal. ? 6 hours before the procedure - stop drinking milk or drinks that contain milk. ? 2 hours before the procedure - stop drinking clear liquids.  Do not drink alcohol for 48 hours before your surgery. Medicines Ask your health care provider about:  Changing or stopping your regular medicines. This is especially important if you are taking diabetes medicines or blood thinners.  Taking medicines such as aspirin and ibuprofen. These medicines can thin your blood. Do not take these medicines unless your health care provider tells you to take them.  Taking over-the-counter medicines, vitamins, herbs, or supplements. Tests You may have an exam or testing, such as:  A physical exam, including medical history.  X-rays or an MRI.  An electrocardiogram (EKG).  Blood or urine tests. General instructions  Plan to have a responsible adult take you home from the hospital or clinic.  Plan to have a responsible adult care for you for the time you are told after you leave the hospital or clinic. This is important.  Prepare your home so you can be safe and have easy access to what you need.  Keep  your body and teeth clean. Germs from anywhere in your body can travel to your new joint and infect it. Tell your health care provider if you: ? Plan to have dental care and routine cleanings. ? Develop any skin infections.  Avoid shaving your legs just before surgery. If hair removal is needed, it will be done at the hospital.  Ask your health care provider: ? How your surgery site will be marked. ? What steps will be taken to help prevent infection. These steps may include:  Removing hair at the surgery site.  Washing skin with a germ-killing  soap.  Taking antibiotic medicine. What happens during the procedure?  An IV will be inserted into one of your veins.  You will be given one or more of the following: ? A medicine to help you relax (sedative). ? A medicine that is injected into your spine to numb the area below and slightly above the injection site (spinal anesthetic). ? A medicine that is injected into an area of your body to numb everything below the injection site (regional anesthetic). ? A medicine to make you fall asleep (general anesthetic).  An incision will be made so the surgeon can see the bones and tissue in your hip. The location of the incision will depend on the approach used by the surgeon: ? Posterior approach. The incision will be at the back of the hip. ? Anterior approach. The incision will be at the front of the hip.  Then, your surgeon will: ? Cut and remove damaged pieces of bone and cartilage. ? Insert a prosthetic ball and socket into the hip joint. ? Secure the ball and socket in the hip joint. ? Do an X-ray of the hip joint to confirm correct placement. ? Place a drain to remove excess fluid, if needed. ? Close the incision and apply a bandage (dressing) over the surgical site. The procedure may vary among health care providers and hospitals. What happens after the procedure?  Your blood pressure, heart rate, breathing rate, and blood oxygen  level will be monitored until you leave the hospital or clinic.  Your health care team will monitor how your nerves and blood vessels are working.  You will be given pain medicine.  You may have to wear compression stockings. These stockings help to prevent blood clots and reduce swelling in your legs. You may also be given a blood-thinning (anticoagulant) medicine.  You will receive physical therapy until your health care provider feels it is safe for you to go home. You may need to use a walker, crutches, or a cane.  If you had the posterior approach, you may have to use a wedge (hip abduction) pillow when you are in bed. This pillow will protect your hip from dislocation by keeping it straight and will prevent your legs from turning inward or away from your body. Summary  Total hip replacement is a surgery to remove damaged bone in your hip joint and replace it with an artificial (prosthetic) hip joint.  Before the procedure, follow instructions from your health care provider about eating and drinking.  Plan to have a responsible adult take you home from the hospital or clinic.  After the surgery, you may have to wear compression stockings. These help to prevent blood clots and reduce swelling in your legs. This information is not intended to replace advice given to you by your health care provider. Make sure you discuss any questions you have with your health care provider. Document Revised: 03/18/2020 Document Reviewed: 03/18/2020 Elsevier Patient Education  2021 [employer].

## 2021-04-11 NOTE — Progress Notes (Signed)
Chief Complaint  Patient presents with  . Hip Pain    Right was scheduled for THR last year, but moved, has now moved back   55 year old male was scheduled for total hip replacement last year and it was canceled.  He is here today complaining of right hip pain which is chronic is a little worse than before  He is on cigarettes still we would advised him to stop cigarettes gradually and I put him on a nicotine patch  Review of systems no other abnormalities relatively healthy  Past Medical History:  Diagnosis Date  . Acid reflux    Past Surgical History:  Procedure Laterality Date  . ESOPHAGOGASTRODUODENOSCOPY  2002   RMR: ulcerative reflux esophagitis, moderate sized hiatal hernia  . HIP SURGERY    . WRIST SURGERY     Family History  Problem Relation Age of Onset  . Diabetes Mother   . Heart disease Mother   . Diabetes Father   . Heart disease Father   . Colon cancer Neg Hx    Social History   Tobacco Use  . Smoking status: Current Every Day Smoker    Packs/day: 1.00    Types: Cigarettes  . Smokeless tobacco: Never Used  Vaping Use  . Vaping Use: Never used  Substance Use Topics  . Alcohol use: Yes    Comment: beer and liquor twice weekly  . 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 Alternative treatment has also been reviewed, questions were encouraged and answered. The postoperative plan is also been reviewed.  Right total hip direct lateral approach

## 2021-04-12 ENCOUNTER — Ambulatory Visit: Payer: BC Managed Care – PPO | Admitting: Internal Medicine

## 2021-04-12 ENCOUNTER — Encounter (HOSPITAL_COMMUNITY): Payer: Self-pay | Admitting: Anesthesiology

## 2021-04-14 ENCOUNTER — Telehealth: Payer: Self-pay | Admitting: Orthopedic Surgery

## 2021-04-14 NOTE — Telephone Encounter (Signed)
Patient left a voice message relaying that he will need to reschedule 05/10/21's surgery.

## 2021-04-15 NOTE — Telephone Encounter (Signed)
Called no answer no voicemail

## 2021-04-20 NOTE — Telephone Encounter (Signed)
Cancelled surgery, he did this last year too. He may not be interested in surgery at all  To you FYI

## 2021-04-27 ENCOUNTER — Encounter: Payer: Self-pay | Admitting: Internal Medicine

## 2021-04-27 ENCOUNTER — Other Ambulatory Visit: Payer: Self-pay

## 2021-04-27 ENCOUNTER — Ambulatory Visit (INDEPENDENT_AMBULATORY_CARE_PROVIDER_SITE_OTHER): Payer: BC Managed Care – PPO | Admitting: Internal Medicine

## 2021-04-27 VITALS — BP 149/91 | HR 84 | Temp 98.6°F | Resp 18 | Ht 71.0 in | Wt 185.1 lb

## 2021-04-27 DIAGNOSIS — Z720 Tobacco use: Secondary | ICD-10-CM | POA: Insufficient documentation

## 2021-04-27 DIAGNOSIS — G629 Polyneuropathy, unspecified: Secondary | ICD-10-CM | POA: Diagnosis not present

## 2021-04-27 DIAGNOSIS — M1610 Unilateral primary osteoarthritis, unspecified hip: Secondary | ICD-10-CM

## 2021-04-27 DIAGNOSIS — Z7689 Persons encountering health services in other specified circumstances: Secondary | ICD-10-CM

## 2021-04-27 DIAGNOSIS — R030 Elevated blood-pressure reading, without diagnosis of hypertension: Secondary | ICD-10-CM

## 2021-04-27 DIAGNOSIS — K219 Gastro-esophageal reflux disease without esophagitis: Secondary | ICD-10-CM | POA: Insufficient documentation

## 2021-04-27 DIAGNOSIS — I10 Essential (primary) hypertension: Secondary | ICD-10-CM | POA: Insufficient documentation

## 2021-04-27 DIAGNOSIS — M1611 Unilateral primary osteoarthritis, right hip: Secondary | ICD-10-CM | POA: Insufficient documentation

## 2021-04-27 DIAGNOSIS — Z1159 Encounter for screening for other viral diseases: Secondary | ICD-10-CM

## 2021-04-27 DIAGNOSIS — Z114 Encounter for screening for human immunodeficiency virus [HIV]: Secondary | ICD-10-CM

## 2021-04-27 DIAGNOSIS — Z1211 Encounter for screening for malignant neoplasm of colon: Secondary | ICD-10-CM

## 2021-04-27 MED ORDER — FAMOTIDINE 20 MG PO TABS: 20.0000 mg | ORAL_TABLET | Freq: Two times a day (BID) | ORAL | 2 refills | Status: DC

## 2021-04-27 NOTE — Assessment & Plan Note (Signed)
Undergoing evaluation for THA

## 2021-04-27 NOTE — Assessment & Plan Note (Signed)
Pepcid 20 mg BID

## 2021-04-27 NOTE — Assessment & Plan Note (Signed)
Smokes about 5 cigarettes/day, has cut down from 1.5 pack/day  Asked about quitting: confirms that he currently smokes cigarettes Advise to quit smoking: Educated about QUITTING to reduce the risk of cancer, cardio and cerebrovascular disease. Assess willingness: Unwilling to quit at this time, but is working on cutting back. Assist with counseling and pharmacotherapy: Counseled for 5 minutes and literature provided. Arrange for follow up: follow up in 3 months and continue to offer help.

## 2021-04-27 NOTE — Progress Notes (Signed)
New Patient Office Visit  Subjective:  Patient ID: David Knox, male    DOB: 1966/10/14  Age: 55 y.o. MRN: [id number]  CC:  Chief Complaint  Patient presents with   New Patient (Initial Visit)    New patient would like to have a1c checked also has been having a numb feeling in left arm for about 2 weeks also feet stay numb for a long time     HPI David Knox is a 55 year old male with PMH of GERD, hip arthritis and tobacco abuse who presents for establishing care. He is a former patient of Dr Luan Pulling.  He has been having numbness in the left arm for last 2 weeks and feet for a long time. He denies any recent injury. He describes pins-and-needle sensation in the feet. His left UE symptoms are worse with raising the arm.  His BP was elevated in the office today, denies h/o HTN in the past. Denies any headache, dizziness, chest pain, dyspnea or palpitations.  He takes OTC antacid for GERD. Denies dysphagia or odynophagia.  He has h/o hip arthritis, for which he sees Dr Aline Brochure.  He has had 2 doses of COVID vaccine.  Past Medical History:  Diagnosis Date   Acid reflux     Past Surgical History:  Procedure Laterality Date   ESOPHAGOGASTRODUODENOSCOPY  2002   RMR: ulcerative reflux esophagitis, moderate sized hiatal hernia   HIP SURGERY     WRIST SURGERY      Family History  Problem Relation Age of Onset   Diabetes Mother    Heart disease Mother    Diabetes Father    Heart disease Father    Colon cancer Neg Hx     Social History   Socioeconomic History   Marital status: Single    Spouse name: Not on file   Number of children: Not on file   Years of education: Not on file   Highest education level: Not on file  Occupational History   Not on file  Tobacco Use   Smoking status: Every Day    Packs/day: 1.00    Pack years: 0.00    Types: Cigarettes   Smokeless tobacco: Never  Vaping Use   Vaping Use: Never used  Substance and Sexual Activity    Alcohol use: Yes    Comment: beer and liquor twice weekly   Drug use: No    Comment: "crack" former   Sexual activity: Not on file  Other Topics Concern   Not on file  Social History Narrative   Not on file   Social Determinants of Health   Financial Resource Strain: Not on file  Food Insecurity: Not on file  Transportation Needs: Not on file  Physical Activity: Not on file  Stress: Not on file  Social Connections: Not on file  Intimate Partner Violence: Not on file    ROS Review of Systems  Constitutional:  Negative for chills and fever.  HENT:  Negative for congestion and sore throat.   Eyes:  Negative for pain and discharge.  Respiratory:  Negative for cough and shortness of breath.   Cardiovascular:  Negative for chest pain and palpitations.  Gastrointestinal:  Negative for constipation, diarrhea, nausea and vomiting.  Endocrine: Negative for polydipsia and polyuria.  Genitourinary:  Negative for dysuria and hematuria.  Musculoskeletal:  Negative for neck pain and neck stiffness.  Skin:  Negative for rash.  Neurological:  Positive for numbness (In left arm and feet). Negative for  dizziness, weakness and headaches.  Psychiatric/Behavioral:  Negative for agitation and behavioral problems.    Objective:   Today's Vitals: BP (!) 149/91 (BP Location: Left Arm, Patient Position: Sitting, Cuff Size: Normal)   Pulse 84   Temp 98.6 F (37 C) (Oral)   Resp 18   Ht '5\' 11"'  (1.803 m)   Wt 185 lb 1.9 oz (84 kg)   SpO2 98%   BMI 25.82 kg/m   Physical Exam Vitals reviewed.  Constitutional:      General: He is not in acute distress.    Appearance: He is not diaphoretic.  HENT:     Head: Normocephalic and atraumatic.     Nose: Nose normal.     Mouth/Throat:     Mouth: Mucous membranes are moist.  Eyes:     General: No scleral icterus.    Extraocular Movements: Extraocular movements intact.  Cardiovascular:     Rate and Rhythm: Normal rate and regular rhythm.      Pulses: Normal pulses.     Heart sounds: Normal heart sounds. No murmur heard. Pulmonary:     Breath sounds: Normal breath sounds. No wheezing or rales.  Abdominal:     Palpations: Abdomen is soft.     Tenderness: There is no abdominal tenderness.  Musculoskeletal:     Cervical back: Neck supple. No tenderness.     Right lower leg: No edema.     Left lower leg: No edema.  Skin:    General: Skin is warm.     Findings: No rash.  Neurological:     General: No focal deficit present.     Mental Status: He is alert and oriented to person, place, and time.     Sensory: No sensory deficit.     Motor: No weakness.  Psychiatric:        Mood and Affect: Mood normal.        Behavior: Behavior normal.    Assessment & Plan:   Problem List Items Addressed This Visit       Encounter to establish care - Primary   Care established Previous chart reviewed History and medications reviewed with the patient      Relevant Orders  CBC with Differential/Platelet  CMP14+EGFR  Lipid panel  TSH + free T4  Vitamin D (25 hydroxy)    Digestive   Gastroesophageal reflux disease without esophagitis    Pepcid 20 mg BID       Relevant Medications   famotidine (PEPCID) 20 MG tablet     Nervous and Auditory   Peripheral polyneuropathy    Pins and needle sensation in feet and arm Will check TSH and Vit B12 Check HbA1C If persistent, will start Gabapentin       Relevant Orders   CMP14+EGFR   TSH + free T4   Hemoglobin A1c   B12     Musculoskeletal and Integument   Hip arthritis    Undergoing evaluation for THA         Other   Tobacco abuse    Smokes about 5 cigarettes/day, has cut down from 1.5 pack/day  Asked about quitting: confirms that he currently smokes cigarettes Advise to quit smoking: Educated about QUITTING to reduce the risk of cancer, cardio and cerebrovascular disease. Assess willingness: Unwilling to quit at this time, but is working on cutting back. Assist  with counseling and pharmacotherapy: Counseled for 5 minutes and literature provided. Arrange for follow up: follow up in 3 months and continue to  offer help.       Elevated BP without diagnosis of hypertension    BP Readings from Last 1 Encounters:  04/27/21 (!) 149/91  Will recheck after 4 weeks If persistently elevated, will start antihypertensive Advised DASH diet and moderate exercise/walking, at least 150 mins/week        Other Visit Diagnoses     Need for hepatitis C screening test       Relevant Orders   Hepatitis C Antibody   Encounter for screening for HIV       Relevant Orders   HIV antibody (with reflex)   Screen for colon cancer       Relevant Orders   Ambulatory referral to Gastroenterology       Outpatient Encounter Medications as of 04/27/2021  Medication Sig   famotidine (PEPCID) 20 MG tablet Take 1 tablet (20 mg total) by mouth 2 (two) times daily.   [DISCONTINUED] EQ ACID REDUCER MAX ST 20 MG tablet Take 40 mg by mouth daily before breakfast.   [DISCONTINUED] nicotine (NICODERM CQ - DOSED IN MG/24 HOURS) 21 mg/24hr patch Place 1 patch (21 mg total) onto the skin daily. (Patient not taking: Reported on 04/27/2021)   No facility-administered encounter medications on file as of 04/27/2021.    Follow-up: Return in about 4 weeks (around 05/25/2021) for Annual physical.   Lindell Spar, MD

## 2021-04-27 NOTE — Assessment & Plan Note (Signed)
Care established Previous chart reviewed History and medications reviewed with the patient 

## 2021-04-27 NOTE — Assessment & Plan Note (Signed)
BP Readings from Last 1 Encounters:  04/27/21 (!) 149/91   Will recheck after 4 weeks If persistently elevated, will start antihypertensive Advised DASH diet and moderate exercise/walking, at least 150 mins/week

## 2021-04-27 NOTE — Patient Instructions (Signed)
Please start taking Famotidine as prescribed for acid reflux.  You are being referred to GI for screening colonoscopy.  Please get fasting blood tests done within a week.

## 2021-04-27 NOTE — Assessment & Plan Note (Signed)
Pins and needle sensation in feet and arm Will check TSH and Vit B12 Check HbA1C If persistent, will start Gabapentin

## 2021-05-03 DIAGNOSIS — Z1159 Encounter for screening for other viral diseases: Secondary | ICD-10-CM | POA: Diagnosis not present

## 2021-05-03 DIAGNOSIS — G629 Polyneuropathy, unspecified: Secondary | ICD-10-CM | POA: Diagnosis not present

## 2021-05-03 DIAGNOSIS — Z7689 Persons encountering health services in other specified circumstances: Secondary | ICD-10-CM | POA: Diagnosis not present

## 2021-05-03 DIAGNOSIS — Z114 Encounter for screening for human immunodeficiency virus [HIV]: Secondary | ICD-10-CM | POA: Diagnosis not present

## 2021-05-04 LAB — CMP14+EGFR
ALT: 36 IU/L (ref 0–44)
AST: 27 IU/L (ref 0–40)
Albumin/Globulin Ratio: 1.7 (ref 1.2–2.2)
Albumin: 4.3 g/dL (ref 3.8–4.9)
Alkaline Phosphatase: 80 IU/L (ref 44–121)
BUN/Creatinine Ratio: 10 (ref 9–20)
BUN: 11 mg/dL (ref 6–24)
Bilirubin Total: <0.2 mg/dL (ref 0.0–1.2)
CO2: 25 mmol/L (ref 20–29)
Calcium: 9.4 mg/dL (ref 8.7–10.2)
Chloride: 104 mmol/L (ref 96–106)
Creatinine, Ser: 1.11 mg/dL (ref 0.76–1.27)
Globulin, Total: 2.5 g/dL (ref 1.5–4.5)
Glucose: 102 mg/dL — ABNORMAL HIGH (ref 65–99)
Potassium: 4.3 mmol/L (ref 3.5–5.2)
Sodium: 140 mmol/L (ref 134–144)
Total Protein: 6.8 g/dL (ref 6.0–8.5)
eGFR: 79 mL/min/{1.73_m2} (ref >59)

## 2021-05-04 LAB — LIPID PANEL
Chol/HDL Ratio: 4.8 ratio (ref 0.0–5.0)
Cholesterol, Total: 150 mg/dL (ref 100–199)
HDL: 31 mg/dL — ABNORMAL LOW (ref >39)
LDL Chol Calc (NIH): 64 mg/dL (ref 0–99)
Triglycerides: 351 mg/dL — ABNORMAL HIGH (ref 0–149)
VLDL Cholesterol Cal: 55 mg/dL — ABNORMAL HIGH (ref 5–40)

## 2021-05-04 LAB — CBC WITH DIFFERENTIAL/PLATELET
Basophils Absolute: 0 10*3/uL (ref 0.0–0.2)
Basos: 1 %
EOS (ABSOLUTE): 0.1 10*3/uL (ref 0.0–0.4)
Eos: 1 %
Hematocrit: 38.5 % (ref 37.5–51.0)
Hemoglobin: 13.1 g/dL (ref 13.0–17.7)
Immature Grans (Abs): 0 10*3/uL (ref 0.0–0.1)
Immature Granulocytes: 0 %
Lymphocytes Absolute: 1.7 10*3/uL (ref 0.7–3.1)
Lymphs: 27 %
MCH: 31.6 pg (ref 26.6–33.0)
MCHC: 34 g/dL (ref 31.5–35.7)
MCV: 93 fL (ref 79–97)
Monocytes Absolute: 0.5 10*3/uL (ref 0.1–0.9)
Monocytes: 7 %
Neutrophils Absolute: 4 10*3/uL (ref 1.4–7.0)
Neutrophils: 64 %
Platelets: 313 10*3/uL (ref 150–450)
RBC: 4.15 x10E6/uL (ref 4.14–5.80)
RDW: 13.2 % (ref 11.6–15.4)
WBC: 6.3 10*3/uL (ref 3.4–10.8)

## 2021-05-04 LAB — HIV ANTIBODY (ROUTINE TESTING W REFLEX): HIV Screen 4th Generation wRfx: NONREACTIVE

## 2021-05-04 LAB — HEPATITIS C ANTIBODY: Hep C Virus Ab: <0.1 s/co ratio (ref 0.0–0.9)

## 2021-05-04 LAB — HEMOGLOBIN A1C
Est. average glucose Bld gHb Est-mCnc: 140 mg/dL
Hgb A1c MFr Bld: 6.5 % — ABNORMAL HIGH (ref 4.8–5.6)

## 2021-05-04 LAB — VITAMIN B12: Vitamin B-12: 452 pg/mL (ref 232–1245)

## 2021-05-04 LAB — TSH+FREE T4
Free T4: 0.91 ng/dL (ref 0.82–1.77)
TSH: 0.333 u[IU]/mL — ABNORMAL LOW (ref 0.450–4.500)

## 2021-05-04 LAB — VITAMIN D 25 HYDROXY (VIT D DEFICIENCY, FRACTURES): Vit D, 25-Hydroxy: 18.4 ng/mL — ABNORMAL LOW (ref 30.0–100.0)

## 2021-05-06 ENCOUNTER — Encounter (HOSPITAL_COMMUNITY): Payer: BC Managed Care – PPO

## 2021-05-06 ENCOUNTER — Other Ambulatory Visit (HOSPITAL_COMMUNITY): Payer: BC Managed Care – PPO

## 2021-05-10 ENCOUNTER — Inpatient Hospital Stay: Admit: 2021-05-10 | Payer: BC Managed Care – PPO | Admitting: Orthopedic Surgery

## 2021-05-10 ENCOUNTER — Encounter: Payer: Self-pay | Admitting: Internal Medicine

## 2021-05-10 SURGERY — ARTHROPLASTY, HIP, TOTAL,POSTERIOR APPROACH
Anesthesia: Choice | Site: Hip | Laterality: Right

## 2021-05-11 ENCOUNTER — Other Ambulatory Visit: Payer: Self-pay | Admitting: Internal Medicine

## 2021-05-11 DIAGNOSIS — K219 Gastro-esophageal reflux disease without esophagitis: Secondary | ICD-10-CM

## 2021-05-23 ENCOUNTER — Ambulatory Visit: Payer: BC Managed Care – PPO | Admitting: Orthopedic Surgery

## 2021-05-25 ENCOUNTER — Encounter: Payer: BC Managed Care – PPO | Admitting: Internal Medicine

## 2021-06-22 ENCOUNTER — Ambulatory Visit (INDEPENDENT_AMBULATORY_CARE_PROVIDER_SITE_OTHER): Payer: BC Managed Care – PPO | Admitting: Internal Medicine

## 2021-06-22 ENCOUNTER — Encounter: Payer: Self-pay | Admitting: Internal Medicine

## 2021-06-22 ENCOUNTER — Other Ambulatory Visit: Payer: Self-pay

## 2021-06-22 VITALS — BP 142/90 | HR 71 | Temp 98.3°F | Resp 18 | Ht 70.0 in | Wt 187.0 lb

## 2021-06-22 DIAGNOSIS — R7303 Prediabetes: Secondary | ICD-10-CM

## 2021-06-22 DIAGNOSIS — E0590 Thyrotoxicosis, unspecified without thyrotoxic crisis or storm: Secondary | ICD-10-CM

## 2021-06-22 DIAGNOSIS — K219 Gastro-esophageal reflux disease without esophagitis: Secondary | ICD-10-CM | POA: Diagnosis not present

## 2021-06-22 DIAGNOSIS — E782 Mixed hyperlipidemia: Secondary | ICD-10-CM | POA: Insufficient documentation

## 2021-06-22 DIAGNOSIS — Z0001 Encounter for general adult medical examination with abnormal findings: Secondary | ICD-10-CM | POA: Diagnosis not present

## 2021-06-22 DIAGNOSIS — G629 Polyneuropathy, unspecified: Secondary | ICD-10-CM

## 2021-06-22 DIAGNOSIS — I10 Essential (primary) hypertension: Secondary | ICD-10-CM

## 2021-06-22 DIAGNOSIS — E559 Vitamin D deficiency, unspecified: Secondary | ICD-10-CM

## 2021-06-22 DIAGNOSIS — Z1211 Encounter for screening for malignant neoplasm of colon: Secondary | ICD-10-CM | POA: Insufficient documentation

## 2021-06-22 MED ORDER — ATORVASTATIN CALCIUM 20 MG PO TABS: 20.0000 mg | ORAL_TABLET | Freq: Every day | ORAL | 1 refills | Status: DC

## 2021-06-22 MED ORDER — AMLODIPINE BESYLATE 5 MG PO TABS: 5.0000 mg | ORAL_TABLET | Freq: Every day | ORAL | 0 refills | Status: DC

## 2021-06-22 MED ORDER — GABAPENTIN 100 MG PO CAPS: 100.0000 mg | ORAL_CAPSULE | Freq: Three times a day (TID) | ORAL | 1 refills | Status: DC

## 2021-06-22 NOTE — Assessment & Plan Note (Signed)
Pepcid 20 mg BID

## 2021-06-22 NOTE — Assessment & Plan Note (Signed)
BP Readings from Last 1 Encounters:  06/22/21 (!) 142/90   New-onset, started Amlodipine 5 mg QD Counseled for compliance with the medications Advised DASH diet and moderate exercise/walking, at least 150 mins/week

## 2021-06-22 NOTE — Assessment & Plan Note (Signed)
Last vitamin D Lab Results  Component Value Date   VD25OH 18.4 (L) 05/03/2021   Advised to take Vitamin D 2000 IU QD 

## 2021-06-22 NOTE — Assessment & Plan Note (Signed)
Annual exam as documented. Counseling done  re healthy lifestyle involving commitment to 150 minutes exercise per week, heart healthy diet, and attaining healthy weight.The importance of adequate sleep also discussed. Changes in health habits are decided on by the patient with goals and time frames  set for achieving them. Immunization and cancer screening needs are specifically addressed at this visit. 

## 2021-06-22 NOTE — Progress Notes (Addendum)
Established Patient Office Visit  Subjective:  Patient ID: David Knox, male    DOB: 14-Dec-1965  Age: 55 y.o. MRN: [id number]  CC:  Chief Complaint  Patient presents with   Annual Exam    Annual exam left arm has been going numb on and off for a few months thinks its a pinched nerve     HPI David Knox is a 55 year old male with PMH of peripheral neuropathy, GERD, hip arthritis and tobacco abuse who presents for annual physical.  He has been having pain in left arm and intermittent numbness in the left UE when he tries to raise arm above 90 degrees. Denies any recent injury.  BP is still elevated. Patient denies headache, dizziness, chest pain, dyspnea or palpitations.  He still has not had hip arthroplasty. He continues to have low back pain, which could be coming from chronic gait change from hip arthritis.  He c/o intermittent burning sensation in his feet, which is worse at night. Denies any tingling or weakness currently.  Blood tests were reviewed and discussed with the patient in detail.  Past Medical History:  Diagnosis Date   Acid reflux     Past Surgical History:  Procedure Laterality Date   ESOPHAGOGASTRODUODENOSCOPY  2002   RMR: ulcerative reflux esophagitis, moderate sized hiatal hernia   HIP SURGERY     WRIST SURGERY      Family History  Problem Relation Age of Onset   Diabetes Mother    Heart disease Mother    Diabetes Father    Heart disease Father    Colon cancer Neg Hx     Social History   Socioeconomic History   Marital status: Single    Spouse name: Not on file   Number of children: Not on file   Years of education: Not on file   Highest education level: Not on file  Occupational History   Not on file  Tobacco Use   Smoking status: Every Day    Packs/day: 1.00    Types: Cigarettes   Smokeless tobacco: Never  Vaping Use   Vaping Use: Never used  Substance and Sexual Activity   Alcohol use: Yes    Comment: beer and  liquor twice weekly   Drug use: No    Comment: "crack" former   Sexual activity: Not on file  Other Topics Concern   Not on file  Social History Narrative   Not on file   Social Determinants of Health   Financial Resource Strain: Not on file  Food Insecurity: Not on file  Transportation Needs: Not on file  Physical Activity: Not on file  Stress: Not on file  Social Connections: Not on file  Intimate Partner Violence: Not on file    Outpatient Medications Prior to Visit  Medication Sig Dispense Refill   famotidine (PEPCID) 20 MG tablet TAKE 1 TABLET BY MOUTH TWICE A DAY 180 tablet 1   No facility-administered medications prior to visit.    No Known Allergies  ROS Review of Systems  Constitutional:  Negative for chills and fever.  HENT:  Negative for congestion and sore throat.   Eyes:  Negative for pain and discharge.  Respiratory:  Negative for cough and shortness of breath.   Cardiovascular:  Negative for chest pain and palpitations.  Gastrointestinal:  Negative for constipation, diarrhea, nausea and vomiting.  Endocrine: Negative for polydipsia and polyuria.  Genitourinary:  Negative for dysuria and hematuria.  Musculoskeletal:  Positive for arthralgias and  back pain. Negative for neck pain and neck stiffness.  Skin:  Negative for rash.  Neurological:  Positive for numbness (In left arm and feet). Negative for dizziness, weakness and headaches.  Psychiatric/Behavioral:  Negative for agitation and behavioral problems.      Objective:    Physical Exam Vitals reviewed.  Constitutional:      General: He is not in acute distress.    Appearance: He is not diaphoretic.  HENT:     Head: Normocephalic and atraumatic.     Nose: Nose normal.     Mouth/Throat:     Mouth: Mucous membranes are moist.  Eyes:     General: No scleral icterus.    Extraocular Movements: Extraocular movements intact.  Neck:     Vascular: No carotid bruit.  Cardiovascular:     Rate and  Rhythm: Normal rate and regular rhythm.     Pulses: Normal pulses.     Heart sounds: Normal heart sounds. No murmur heard. Pulmonary:     Breath sounds: Normal breath sounds. No wheezing or rales.  Abdominal:     Palpations: Abdomen is soft.     Tenderness: There is no abdominal tenderness.  Musculoskeletal:     Cervical back: Neck supple. No tenderness.     Right lower leg: No edema.     Left lower leg: No edema.  Skin:    General: Skin is warm.     Findings: No rash.  Neurological:     General: No focal deficit present.     Mental Status: He is alert and oriented to person, place, and time.     Cranial Nerves: No cranial nerve deficit.     Sensory: No sensory deficit.     Motor: No weakness.  Psychiatric:        Mood and Affect: Mood normal.        Behavior: Behavior normal.    BP (!) 142/90 (BP Location: Left Arm, Patient Position: Sitting, Cuff Size: Normal)   Pulse 71   Temp 98.3 F (36.8 C) (Oral)   Resp 18   Ht _0  (1.778 m)   Wt 187 lb (84.8 kg)   SpO2 98%   BMI 26.83 kg/m  Wt Readings from Last 3 Encounters:  06/22/21 187 lb (84.8 kg)  04/27/21 185 lb 1.9 oz (84 kg)  04/11/21 180 lb (81.6 kg)     Health Maintenance Due  Topic Date Due   Pneumococcal Vaccine 72-28 Years old (1 - PCV) Never done   TETANUS/TDAP  Never done   COLONOSCOPY (Pts 45-71yr Insurance coverage will need to be confirmed)  Never done   Zoster Vaccines- Shingrix (1 of 2) Never done   COVID-19 Vaccine (3 - Booster for Moderna series) 08/23/2020   INFLUENZA VACCINE  06/06/2021    There are no preventive care reminders to display for this patient.  Lab Results  Component Value Date   TSH 0.333 (L) 05/03/2021   Lab Results  Component Value Date   WBC 6.3 05/03/2021   HGB 13.1 05/03/2021   HCT 38.5 05/03/2021   MCV 93 05/03/2021   PLT 313 05/03/2021   Lab Results  Component Value Date   NA 140 05/03/2021   K 4.3 05/03/2021   CO2 25 05/03/2021   GLUCOSE 102 (H)  05/03/2021   BUN 11 05/03/2021   CREATININE 1.11 05/03/2021   BILITOT <0.2 05/03/2021   ALKPHOS 80 05/03/2021   AST 27 05/03/2021   ALT 36 05/03/2021  PROT 6.8 05/03/2021   ALBUMIN 4.3 05/03/2021   CALCIUM 9.4 05/03/2021   ANIONGAP 10 04/23/2020   EGFR 79 05/03/2021   Lab Results  Component Value Date   CHOL 150 05/03/2021   Lab Results  Component Value Date   HDL 31 (L) 05/03/2021   Lab Results  Component Value Date   LDLCALC 64 05/03/2021   Lab Results  Component Value Date   TRIG 351 (H) 05/03/2021   Lab Results  Component Value Date   CHOLHDL 4.8 05/03/2021   Lab Results  Component Value Date   HGBA1C 6.5 (H) 05/03/2021      Assessment & Plan:   Problem List Items Addressed This Visit       Encounter for general adult medical examination with abnormal findings - Primary   Annual exam as documented. Counseling done  re healthy lifestyle involving commitment to 150 minutes exercise per week, heart healthy diet, and attaining healthy weight.The importance of adequate sleep also discussed. Changes in health habits are decided on by the patient with goals and time frames  set for achieving them. Immunization and cancer screening needs are specifically addressed at this visit.       Cardiovascular and Mediastinum   HTN (hypertension)    BP Readings from Last 1 Encounters:  06/22/21 (!) 142/90  New-onset, started Amlodipine 5 mg QD Counseled for compliance with the medications Advised DASH diet and moderate exercise/walking, at least 150 mins/week       Relevant Medications   amLODipine (NORVASC) 5 MG tablet   atorvastatin (LIPITOR) 20 MG tablet     Digestive   Gastroesophageal reflux disease without esophagitis    Pepcid 20 mg BID        Endocrine   Subclinical hyperthyroidism    Lab Results  Component Value Date   TSH 0.333 (L) 05/03/2021  Normal free T4 Will recheck level later        Nervous and Auditory   Peripheral polyneuropathy     Started Gabapentin Has prediabetes, could be related to insulin resistance      Relevant Medications   gabapentin (NEURONTIN) 100 MG capsule     Other   Vitamin D deficiency    Last vitamin D Lab Results  Component Value Date   VD25OH 18.4 (L) 05/03/2021  Advised to take Vitamin D 2000 IU QD      Prediabetes    Lab Results  Component Value Date   HGBA1C 6.5 (H) 05/03/2021  Advised to strictly follow low carb diet, patient agrees. Started Atorvastatin for HLD      Mixed hyperlipidemia    Lipid profile reviewed Started Atorvastatin      Relevant Medications   amLODipine (NORVASC) 5 MG tablet   atorvastatin (LIPITOR) 20 MG tablet          Meds ordered this encounter  Medications   amLODipine (NORVASC) 5 MG tablet    Sig: Take 1 tablet (5 mg total) by mouth daily.    Dispense:  90 tablet    Refill:  0   atorvastatin (LIPITOR) 20 MG tablet    Sig: Take 1 tablet (20 mg total) by mouth daily.    Dispense:  90 tablet    Refill:  1   gabapentin (NEURONTIN) 100 MG capsule    Sig: Take 1 capsule (100 mg total) by mouth 3 (three) times daily.    Dispense:  90 capsule    Refill:  1    Follow-up: Return in 3  months (on 09/22/2021) for HTN and HbA1C check.    Lindell Spar, MD

## 2021-06-22 NOTE — Patient Instructions (Signed)
Please start taking Amlodipine for blood pressure. Start taking Atorvastatin for cholesterol.  Start taking Gabapentin for neuropathic pain and numbness.  Please discuss with Orthopedic Surgeon about your hand symptoms.  Please follow DASH diet and perform moderate exercise/walking at least 150 mins/week.

## 2021-06-22 NOTE — Assessment & Plan Note (Signed)
Started Gabapentin Has prediabetes, could be related to insulin resistance

## 2021-06-22 NOTE — Assessment & Plan Note (Signed)
Lab Results  Component Value Date   TSH 0.333 (L) 05/03/2021   Normal free T4 Will recheck level later

## 2021-06-22 NOTE — Assessment & Plan Note (Signed)
Lipid profile reviewed Started Atorvastatin 

## 2021-06-22 NOTE — Assessment & Plan Note (Signed)
Lab Results  Component Value Date   HGBA1C 6.5 (H) 05/03/2021   Advised to strictly follow low carb diet, patient agrees. Started Atorvastatin for HLD

## 2021-09-11 NOTE — Progress Notes (Deleted)
Referring Provider: Anabel Halon, MD Primary Care Physician:  Anabel Halon, MD Primary Gastroenterologist:  Dr. Jena Gauss  No chief complaint on file.   HPI:   David Knox is a 55 y.o. male presenting today at the request of Anabel Halon, MD for colon cancer screening. OV due to history of substance abuse.   Last seen in our office September 2018.  Had plan to schedule first-ever screening colonoscopy and EGD due to severe heartburn, vomiting, epigastric pain, odynophagia, but patient no-show to preop.  He was also started on Protonix 40 mg daily and encouraged to cut back on alcohol use.  Today:    Has subclinical hyperthyroidism.  Past Medical History:  Diagnosis Date   Acid reflux     Past Surgical History:  Procedure Laterality Date   ESOPHAGOGASTRODUODENOSCOPY  2002   RMR: ulcerative reflux esophagitis, moderate sized hiatal hernia   HIP SURGERY     WRIST SURGERY      Current Outpatient Medications  Medication Sig Dispense Refill   amLODipine (NORVASC) 5 MG tablet Take 1 tablet (5 mg total) by mouth daily. 90 tablet 0   atorvastatin (LIPITOR) 20 MG tablet Take 1 tablet (20 mg total) by mouth daily. 90 tablet 1   famotidine (PEPCID) 20 MG tablet TAKE 1 TABLET BY MOUTH TWICE A DAY 180 tablet 1   gabapentin (NEURONTIN) 100 MG capsule Take 1 capsule (100 mg total) by mouth 3 (three) times daily. 90 capsule 1   No current facility-administered medications for this visit.    Allergies as of 09/12/2021   (No Known Allergies)    Family History  Problem Relation Age of Onset   Diabetes Mother    Heart disease Mother    Diabetes Father    Heart disease Father    Colon cancer Neg Hx     Social History   Socioeconomic History   Marital status: Single    Spouse name: Not on file   Number of children: Not on file   Years of education: Not on file   Highest education level: Not on file  Occupational History   Not on file  Tobacco Use   Smoking  status: Every Day    Packs/day: 1.00    Types: Cigarettes   Smokeless tobacco: Never  Vaping Use   Vaping Use: Never used  Substance and Sexual Activity   Alcohol use: Yes    Comment: beer and liquor twice weekly   Drug use: No    Comment: "crack" former   Sexual activity: Not on file  Other Topics Concern   Not on file  Social History Narrative   Not on file   Social Determinants of Health   Financial Resource Strain: Not on file  Food Insecurity: Not on file  Transportation Needs: Not on file  Physical Activity: Not on file  Stress: Not on file  Social Connections: Not on file  Intimate Partner Violence: Not on file    Review of Systems: Gen: Denies any fever, chills, fatigue, weight loss, lack of appetite.  CV: Denies chest pain, heart palpitations, peripheral edema, syncope.  Resp: Denies shortness of breath at rest or with exertion. Denies wheezing or cough.  GI: Denies dysphagia or odynophagia. Denies jaundice, hematemesis, fecal incontinence. GU : Denies urinary burning, urinary frequency, urinary hesitancy MS: Denies joint pain, muscle weakness, cramps, or limitation of movement.  Derm: Denies rash, itching, dry skin Psych: Denies depression, anxiety, memory loss, and confusion Heme: Denies  bruising, bleeding, and enlarged lymph nodes.  Physical Exam: There were no vitals taken for this visit. General:   Alert and oriented. Pleasant and cooperative. Well-nourished and well-developed.  Head:  Normocephalic and atraumatic. Eyes:  Without icterus, sclera clear and conjunctiva pink.  Ears:  Normal auditory acuity. Nose:  No deformity, discharge,  or lesions. Mouth:  No deformity or lesions, oral mucosa pink.  Neck:  Supple, without mass or thyromegaly. Lungs:  Clear to auscultation bilaterally. No wheezes, rales, or rhonchi. No distress.  Heart:  S1, S2 present without murmurs appreciated.  Abdomen:  +BS, soft, non-tender and non-distended. No HSM noted. No  guarding or rebound. No masses appreciated.  Rectal:  Deferred  Msk:  Symmetrical without gross deformities. Normal posture. Pulses:  Normal pulses noted. Extremities:  Without clubbing or edema. Neurologic:  Alert and  oriented x4;  grossly normal neurologically. Skin:  Intact without significant lesions or rashes. Cervical Nodes:  No significant cervical adenopathy. Psych:  Alert and cooperative. Normal mood and affect.

## 2021-09-12 ENCOUNTER — Ambulatory Visit: Payer: Self-pay | Admitting: Gastroenterology

## 2021-09-12 ENCOUNTER — Other Ambulatory Visit: Payer: Self-pay

## 2021-09-18 ENCOUNTER — Other Ambulatory Visit: Payer: Self-pay | Admitting: Internal Medicine

## 2021-09-18 DIAGNOSIS — I10 Essential (primary) hypertension: Secondary | ICD-10-CM

## 2021-09-22 ENCOUNTER — Ambulatory Visit: Payer: BC Managed Care – PPO | Admitting: Internal Medicine

## 2022-01-11 NOTE — Progress Notes (Deleted)
? ?Referring Provider: Anabel Halon, MD ?Primary Care Physician:  Anabel Halon, MD ?Primary Gastroenterologist:  Dr. Jena Gauss ? ?No chief complaint on file. ? ? ?HPI:   ?David Knox is a 56 y.o. male presenting today at the request of Anabel Halon, MD for consult colonoscopy.  Recommended office visit due to history of drug use. ? ?We saw patient previously in September 2018 for epigastric abdominal pain, reflux, odynophagia, and colon cancer screening.  Reported severe heartburn for years with associated vomiting.  Some odynophagia, but no dysphagia.  Some hematemesis intermittently.  Epigastric pain.  No BRBPR or melena.  Abdominal exam benign.  He was started on Protonix 40 mg daily and plan to proceed with EGD and colonoscopy. ? ?Patient no-show for his preop appointment and we have not seen him since. ? ?Past Medical History:  ?Diagnosis Date  ? Acid reflux   ? ? ?Past Surgical History:  ?Procedure Laterality Date  ? ESOPHAGOGASTRODUODENOSCOPY  2002  ? RMR: ulcerative reflux esophagitis, moderate sized hiatal hernia  ? HIP SURGERY    ? WRIST SURGERY    ? ? ?Current Outpatient Medications  ?Medication Sig Dispense Refill  ? amLODipine (NORVASC) 5 MG tablet TAKE 1 TABLET (5 MG TOTAL) BY MOUTH DAILY. 90 tablet 0  ? atorvastatin (LIPITOR) 20 MG tablet Take 1 tablet (20 mg total) by mouth daily. 90 tablet 1  ? famotidine (PEPCID) 20 MG tablet TAKE 1 TABLET BY MOUTH TWICE A DAY 180 tablet 1  ? gabapentin (NEURONTIN) 100 MG capsule Take 1 capsule (100 mg total) by mouth 3 (three) times daily. 90 capsule 1  ? ?No current facility-administered medications for this visit.  ? ? ?Allergies as of 01/12/2022  ? (No Known Allergies)  ? ? ?Family History  ?Problem Relation Age of Onset  ? Diabetes Mother   ? Heart disease Mother   ? Diabetes Father   ? Heart disease Father   ? Colon cancer Neg Hx   ? ? ?Social History  ? ?Socioeconomic History  ? Marital status: Single  ?  Spouse name: Not on file  ? Number of  children: Not on file  ? Years of education: Not on file  ? Highest education level: Not on file  ?Occupational History  ? Not on file  ?Tobacco Use  ? Smoking status: Every Day  ?  Packs/day: 1.00  ?  Types: Cigarettes  ? Smokeless tobacco: Never  ?Vaping Use  ? Vaping Use: Never used  ?Substance and Sexual Activity  ? Alcohol use: Yes  ?  Comment: beer and liquor twice weekly  ? Drug use: No  ?  Comment: "crack" former  ? Sexual activity: Not on file  ?Other Topics Concern  ? Not on file  ?Social History Narrative  ? Not on file  ? ?Social Determinants of Health  ? ?Financial Resource Strain: Not on file  ?Food Insecurity: Not on file  ?Transportation Needs: Not on file  ?Physical Activity: Not on file  ?Stress: Not on file  ?Social Connections: Not on file  ?Intimate Partner Violence: Not on file  ? ? ?Review of Systems: ?Gen: Denies any fever, chills, fatigue, weight loss, lack of appetite.  ?CV: Denies chest pain, heart palpitations, peripheral edema, syncope.  ?Resp: Denies shortness of breath at rest or with exertion. Denies wheezing or cough.  ?GI: Denies dysphagia or odynophagia. Denies jaundice, hematemesis, fecal incontinence. ?GU : Denies urinary burning, urinary frequency, urinary hesitancy ?MS: Denies joint pain,  muscle weakness, cramps, or limitation of movement.  ?Derm: Denies rash, itching, dry skin ?Psych: Denies depression, anxiety, memory loss, and confusion ?Heme: Denies bruising, bleeding, and enlarged lymph nodes. ? ?Physical Exam: ?There were no vitals taken for this visit. ?General:   Alert and oriented. Pleasant and cooperative. Well-nourished and well-developed.  ?Head:  Normocephalic and atraumatic. ?Eyes:  Without icterus, sclera clear and conjunctiva pink.  ?Ears:  Normal auditory acuity. ?Nose:  No deformity, discharge,  or lesions. ?Mouth:  No deformity or lesions, oral mucosa pink.  ?Neck:  Supple, without mass or thyromegaly. ?Lungs:  Clear to auscultation bilaterally. No wheezes,  rales, or rhonchi. No distress.  ?Heart:  S1, S2 present without murmurs appreciated.  ?Abdomen:  +BS, soft, non-tender and non-distended. No HSM noted. No guarding or rebound. No masses appreciated.  ?Rectal:  Deferred  ?Msk:  Symmetrical without gross deformities. Normal posture. ?Pulses:  Normal pulses noted. ?Extremities:  Without clubbing or edema. ?Neurologic:  Alert and  oriented x4;  grossly normal neurologically. ?Skin:  Intact without significant lesions or rashes. ?Cervical Nodes:  No significant cervical adenopathy. ?Psych:  Alert and cooperative. Normal mood and affect.  ?

## 2022-01-12 ENCOUNTER — Ambulatory Visit: Payer: Self-pay | Admitting: Gastroenterology

## 2022-01-12 ENCOUNTER — Encounter: Payer: Self-pay | Admitting: Internal Medicine

## 2022-03-22 ENCOUNTER — Encounter: Payer: Self-pay | Admitting: Orthopedic Surgery

## 2022-03-22 ENCOUNTER — Ambulatory Visit (INDEPENDENT_AMBULATORY_CARE_PROVIDER_SITE_OTHER): Payer: 59 | Admitting: Orthopedic Surgery

## 2022-03-22 DIAGNOSIS — M167 Other unilateral secondary osteoarthritis of hip: Secondary | ICD-10-CM

## 2022-03-22 MED ORDER — NAPROXEN 500 MG PO TABS: 500.0000 mg | ORAL_TABLET | Freq: Two times a day (BID) | ORAL | 2 refills | Status: DC

## 2022-03-22 NOTE — Patient Instructions (Addendum)
Your surgery will be at [facility] by Dr Aline Brochure on May 16, 2022 ?The hospital will contact you with a preoperative appointment to discuss Anesthesia. The phone number is [phone] ? Please bring your medications with you for the appointment. ?They will tell you the arrival time and medication instructions when you have your preoperative evaluation. ?Do not wear nail polish the day of your surgery and if you take Phentermine you need to stop this medication ONE WEEK prior to your surgery.  ? ?Total Hip Replacement ?Total hip replacement is a surgical procedure to remove damaged bone in your hip joint and replace it with an artificial hip joint (prosthetic hip joint). The purpose of this surgery is to reduce pain and to improve your hip function. ?During a total hip replacement, one or both parts of the hip joint are replaced, depending on the type of joint damage you have. The hip is a ball-and-socket type of joint, and it has two main parts. The ball part of the joint (femoral head) is the top of the thigh bone (femur). The socket part of the joint is a large indent in the side of your pelvis (acetabulum) where the femur and pelvis meet. ?Tell a health care provider about: ?Any allergies you have. ?All medicines you are taking, including vitamins, herbs, eye drops, creams, and over-the-counter medicines. ?Any problems you or family members have had with anesthetic medicines. ?Any blood disorders you have. ?Any surgeries you have had. ?Any medical conditions you have. ?What are the risks? ?Generally, total hip replacement is a safe procedure. However, problems can occur, including: ?Infection. ?Dislocation (the ball of the hip-joint prosthesis comes out of contact with the socket). ?Loosening of the piece (stem) that connects the prosthetic femoral head to the femur. ?Fracture of the bone while inserting the prosthesis. ?Formation of blood clots, which can break loose and travel to and injure your lungs  (pulmonary embolus). ? ?What happens before the procedure? ?Plan to have someone take you home after the procedure. ?Do not eat or drink anything after midnight on the night before the procedure or as directed by your health care provider. ?Ask your health care provider about: ?Changing or stopping your regular medicines. This is especially important if you are taking diabetes medicines or blood thinners. ?Taking medicines such as aspirin and ibuprofen. These medicines can thin your blood. Do not take these medicines before your procedure if your health care provider asks you not to. ?Ask your health care provider about how your surgical site will be marked or identified. ?You may be given antibiotic medicines to help prevent infection. ?What happens during the procedure? ?To reduce your risk of infection: ?Your health care team will wash or sanitize their hands. ?Your skin will be washed with soap. ?An IV tube will be inserted into one of your veins. You will be given one or more of the following: ?A medicine that makes you drowsy (sedative). ?A medicine that makes you fall asleep (general anesthetic). ?A medicine injected into your spine that numbs your body below the waist (spinal anesthetic). ?An incision will be made in your hip. Your surgeon will take out any damaged cartilage and bone. ?Your surgeon will then: ?Insert a prosthetic socket into the acetabulum of your pelvis. This is usually secured with screws. ?Remove the femoral head and replace it with a prosthetic ball and stem secured into the top of your femur. ?Place the ball into the socket and check the range of motion and  stability of your new hip. ?Close the incision and apply a bandage over the surgical site. ?What happens after the procedure? ?You will stay in a recovery area until the medicines have worn off. ?Your vital signs, such as your pulse and blood pressure, will be monitored. ?Once you are awake and stable, you will be taken to a hospital  room. ?You may be directed to take actions to help prevent blood clots. These may include: ?Walking soon after surgery, with someone assisting you. Moving around after surgery helps to improve blood flow. ?Taking medicines to thin your blood (anticoagulants). ?Wearing compression stockings or using different types of devices. ?You will receive physical therapy until you are doing well and your health care provider feels it is safe for you to go home. ?This information is not intended to replace advice given to you by your health care provider. Make sure you discuss any questions you have with your health care provider. ?Document Released: 01/29/2001 Document Revised: 06/26/2016 Document Reviewed: 12/24/2013 ?Elsevier Interactive Patient Education ? 2018 [place]. ?You have decided to proceed with hip replacement surgery. You have decided not to continue with nonoperative measures such as but not limited to oral medication, weight loss, activity modification, physical therapy, or injection. ? ?We will perform the procedure commonly known as total hip replacement. Some of the risks associated with total hip replacement include but are not limited to ?Bleeding ?Infection ?Swelling ?Stiffness ?Blood clot ?Continued Pain ?Dislocation ?LEG LENGTH INEQUALITY REQUIRING A SHOE LIFT ?Persistent limp ? ?Infection is a devastating complication of joint replacement surgery. If you get an infection the implant usually will have to be removed and several surgeries and antibiotics will be needed to eradicate the infection. In some rare cases the hip cannot be reconstructed with another implant. This will lead to permanent need for crutches and assistive devices to ambulate. ? ? ?In compliance with recent [place] law in federal regulation regarding opioid use and abuse and addiction, we will taper (stop) opioid medication to avoid dependence and addiction. The goal is to have you on no opioid medication by week 4  ? ? ?If  you're not comfortable with these risks and would like to continue with nonoperative treatment please let Dr. Aline Brochure know prior to your surgery.  ?

## 2022-03-22 NOTE — Progress Notes (Signed)
FOLLOW UP  ? ?Encounter Diagnosis  ?Name Primary?  ? Other secondary osteoarthritis of right hip Yes  ? ? ? ?Chief Complaint  ?Patient presents with  ? Hip Pain  ?  R/hurting really bad, the sooner I can get the surgery the better.  ? ? ? ?Mr. Mckinstry stop smoking he is doing well in that regard but his right hip is still hurting him quite significantly ? 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  His nonoperative treatment included activity modification oral medication, prednisone ? ?He is has good weight his BMI is in the 20s. ? ?His exam today reveals a right lower extremity antalgic gait with pain with flexion and internal rotation and adduction of his hip his leg lengths are equal.  He has pain at 90 degrees of flexion throughout the arc of motion total terminal flexion of 120 and he has pain at 10 degrees of adduction ? ?His images show severe arthritis of both hips right worse than left with pistol-grip deformity. ? ?The patient is ready for right total hip arthroplasty ? ?His preop history and physical be done 1 week prior to surgery ? ?He is placed on Naprosyn for pain until then ? ? ?Encounter Diagnosis  ?Name Primary?  ? Other secondary osteoarthritis of right hip Yes  ? ?Meds ordered this encounter  ?Medications  ? naproxen (NAPROSYN) 500 MG tablet  ?  Sig: Take 1 tablet (500 mg total) by mouth 2 (two) times daily with a meal.  ?  Dispense:  60 tablet  ?  Refill:  2  ? ? ? ?

## 2022-03-24 ENCOUNTER — Other Ambulatory Visit: Payer: Self-pay

## 2022-03-24 DIAGNOSIS — M167 Other unilateral secondary osteoarthritis of hip: Secondary | ICD-10-CM

## 2022-03-24 MED ORDER — BUPIVACAINE-MELOXICAM ER 400-12 MG/14ML IJ SOLN: 400.0000 mg | Freq: Once | INTRAMUSCULAR | Status: DC

## 2022-03-24 MED ORDER — TRANEXAMIC ACID 1000 MG/10ML IV SOLN: 1000.0000 mg | INTRAVENOUS | Status: AC

## 2022-04-05 ENCOUNTER — Ambulatory Visit (INDEPENDENT_AMBULATORY_CARE_PROVIDER_SITE_OTHER): Payer: 59 | Admitting: Internal Medicine

## 2022-04-05 ENCOUNTER — Encounter: Payer: Self-pay | Admitting: *Deleted

## 2022-04-05 ENCOUNTER — Encounter: Payer: Self-pay | Admitting: Internal Medicine

## 2022-04-05 VITALS — BP 146/102 | HR 85 | Resp 18 | Ht 71.0 in | Wt 186.2 lb

## 2022-04-05 DIAGNOSIS — M1610 Unilateral primary osteoarthritis, unspecified hip: Secondary | ICD-10-CM | POA: Diagnosis not present

## 2022-04-05 DIAGNOSIS — R7303 Prediabetes: Secondary | ICD-10-CM | POA: Diagnosis not present

## 2022-04-05 DIAGNOSIS — G629 Polyneuropathy, unspecified: Secondary | ICD-10-CM

## 2022-04-05 DIAGNOSIS — I10 Essential (primary) hypertension: Secondary | ICD-10-CM | POA: Diagnosis not present

## 2022-04-05 DIAGNOSIS — Z1211 Encounter for screening for malignant neoplasm of colon: Secondary | ICD-10-CM

## 2022-04-05 DIAGNOSIS — E782 Mixed hyperlipidemia: Secondary | ICD-10-CM

## 2022-04-05 DIAGNOSIS — E0590 Thyrotoxicosis, unspecified without thyrotoxic crisis or storm: Secondary | ICD-10-CM

## 2022-04-05 MED ORDER — LOSARTAN POTASSIUM 50 MG PO TABS: 50.0000 mg | ORAL_TABLET | Freq: Every day | ORAL | 3 refills | Status: DC

## 2022-04-05 MED ORDER — AMLODIPINE BESYLATE 5 MG PO TABS: 5.0000 mg | ORAL_TABLET | Freq: Every day | ORAL | 3 refills | Status: DC

## 2022-04-05 MED ORDER — ATORVASTATIN CALCIUM 20 MG PO TABS: 20.0000 mg | ORAL_TABLET | Freq: Every day | ORAL | 1 refills | Status: DC

## 2022-04-05 MED ORDER — GABAPENTIN 100 MG PO CAPS: 100.0000 mg | ORAL_CAPSULE | Freq: Three times a day (TID) | ORAL | 3 refills | Status: DC

## 2022-04-05 NOTE — Assessment & Plan Note (Addendum)
Lab Results  Component Value Date   TSH 0.333 (L) 05/03/2021   Normal free T4 Will recheck TSH and free T4

## 2022-04-05 NOTE — Assessment & Plan Note (Signed)
Lipid profile reviewed Started Atorvastatin 

## 2022-04-05 NOTE — Assessment & Plan Note (Signed)
Lab Results  Component Value Date   HGBA1C 6.5 (H) 05/03/2021   Advised to strictly follow low carb diet, patient agrees.

## 2022-04-05 NOTE — Patient Instructions (Signed)
Please start taking medications as prescribed.  Please continue to follow low salt diet and perform moderate exercise/walking at least 150 mins/week. 

## 2022-04-05 NOTE — Assessment & Plan Note (Signed)
BP Readings from Last 1 Encounters:  04/05/22 (!) 146/102   Uncontrolled, refilled Amlodipine 5 mg QD Added Losartan 50 mg QD, check CMP after 2 weeks Counseled for compliance with the medications Advised DASH diet and moderate exercise/walking, at least 150 mins/week

## 2022-04-05 NOTE — Progress Notes (Signed)
Established Patient Office Visit  Subjective:  Patient ID: David Knox, male    DOB: 1965/12/10  Age: 56 y.o. MRN: [id number]  CC:  Chief Complaint  Patient presents with   Follow-up    Follow up     HPI David Knox is a 56 y.o. male with past medical history of HTN, peripheral neuropathy, GERD, hip arthritis and tobacco abuse who presents for f/u of his chronic medical conditions.  HTN: His BP was significantly elevated Has run out of his amlodipine.  He complains of headache, but denies any chest pain, dyspnea or palpitations.  He still has not had hip arthroplasty. He continues to have low back pain, which could be coming from chronic gait change from hip arthritis.   He c/o intermittent burning sensation in his feet, which is worse at night. Denies any tingling or weakness currently.     Past Medical History:  Diagnosis Date   Acid reflux     Past Surgical History:  Procedure Laterality Date   ESOPHAGOGASTRODUODENOSCOPY  2002   RMR: ulcerative reflux esophagitis, moderate sized hiatal hernia   HIP SURGERY     WRIST SURGERY      Family History  Problem Relation Age of Onset   Diabetes Mother    Heart disease Mother    Diabetes Father    Heart disease Father    Colon cancer Neg Hx     Social History   Socioeconomic History   Marital status: Single    Spouse name: Not on file   Number of children: Not on file   Years of education: Not on file   Highest education level: Not on file  Occupational History   Not on file  Tobacco Use   Smoking status: Former    Packs/day: 1.00    Types: Cigarettes    Quit date: 12/22/2021    Years since quitting: 0.2   Smokeless tobacco: Never  Vaping Use   Vaping Use: Never used  Substance and Sexual Activity   Alcohol use: Yes    Comment: beer and liquor twice weekly   Drug use: No    Comment: "crack" former   Sexual activity: Not on file  Other Topics Concern   Not on file  Social History Narrative    Not on file   Social Determinants of Health   Financial Resource Strain: Not on file  Food Insecurity: Not on file  Transportation Needs: Not on file  Physical Activity: Not on file  Stress: Not on file  Social Connections: Not on file  Intimate Partner Violence: Not on file    Outpatient Medications Prior to Visit  Medication Sig Dispense Refill   famotidine (PEPCID) 20 MG tablet TAKE 1 TABLET BY MOUTH TWICE A DAY 180 tablet 1   naproxen (NAPROSYN) 500 MG tablet Take 1 tablet (500 mg total) by mouth 2 (two) times daily with a meal. 60 tablet 2   amLODipine (NORVASC) 5 MG tablet TAKE 1 TABLET (5 MG TOTAL) BY MOUTH DAILY. (Patient not taking: Reported on 04/05/2022) 90 tablet 0   atorvastatin (LIPITOR) 20 MG tablet Take 1 tablet (20 mg total) by mouth daily. (Patient not taking: Reported on 04/05/2022) 90 tablet 1   gabapentin (NEURONTIN) 100 MG capsule Take 1 capsule (100 mg total) by mouth 3 (three) times daily. (Patient not taking: Reported on 04/05/2022) 90 capsule 1   Facility-Administered Medications Prior to Visit  Medication Dose Route Frequency Provider Last Rate Last Admin  bupivacaine-meloxicam ER (ZYNRELEF) injection 400 mg  400 mg Infiltration Once Carole Civil, MD        No Known Allergies  ROS Review of Systems  Constitutional:  Negative for chills and fever.  HENT:  Negative for congestion and sore throat.   Eyes:  Negative for pain and discharge.  Respiratory:  Negative for cough and shortness of breath.   Cardiovascular:  Negative for chest pain and palpitations.  Gastrointestinal:  Negative for constipation, diarrhea, nausea and vomiting.  Endocrine: Negative for polydipsia and polyuria.  Genitourinary:  Negative for dysuria and hematuria.  Musculoskeletal:  Positive for arthralgias and back pain. Negative for neck pain and neck stiffness.  Skin:  Negative for rash.  Neurological:  Positive for numbness (In left arm and feet). Negative for dizziness,  weakness and headaches.  Psychiatric/Behavioral:  Negative for agitation and behavioral problems.      Objective:    Physical Exam Vitals reviewed.  Constitutional:      General: He is not in acute distress.    Appearance: He is not diaphoretic.  HENT:     Head: Normocephalic and atraumatic.     Nose: Nose normal.     Mouth/Throat:     Mouth: Mucous membranes are moist.  Eyes:     General: No scleral icterus.    Extraocular Movements: Extraocular movements intact.  Neck:     Vascular: No carotid bruit.  Cardiovascular:     Rate and Rhythm: Normal rate and regular rhythm.     Pulses: Normal pulses.     Heart sounds: Normal heart sounds. No murmur heard. Pulmonary:     Breath sounds: Normal breath sounds. No wheezing or rales.  Musculoskeletal:     Cervical back: Neck supple. No tenderness.     Right lower leg: No edema.     Left lower leg: No edema.  Skin:    General: Skin is warm.     Findings: No rash.  Neurological:     General: No focal deficit present.     Mental Status: He is alert and oriented to person, place, and time.     Sensory: No sensory deficit.     Motor: No weakness.  Psychiatric:        Mood and Affect: Mood normal.        Behavior: Behavior normal.    BP (!) 146/102 (BP Location: Right Arm, Cuff Size: Normal)   Pulse 85   Resp 18   Ht _0  (1.803 m)   Wt 186 lb 3.2 oz (84.5 kg)   SpO2 98%   BMI 25.97 kg/m  Wt Readings from Last 3 Encounters:  04/05/22 186 lb 3.2 oz (84.5 kg)  06/22/21 187 lb (84.8 kg)  04/27/21 185 lb 1.9 oz (84 kg)    Lab Results  Component Value Date   TSH 0.333 (L) 05/03/2021   Lab Results  Component Value Date   WBC 6.3 05/03/2021   HGB 13.1 05/03/2021   HCT 38.5 05/03/2021   MCV 93 05/03/2021   PLT 313 05/03/2021   Lab Results  Component Value Date   NA 140 05/03/2021   K 4.3 05/03/2021   CO2 25 05/03/2021   GLUCOSE 102 (H) 05/03/2021   BUN 11 05/03/2021   CREATININE 1.11 05/03/2021   BILITOT <0.2  05/03/2021   ALKPHOS 80 05/03/2021   AST 27 05/03/2021   ALT 36 05/03/2021   PROT 6.8 05/03/2021   ALBUMIN 4.3 05/03/2021   CALCIUM 9.4  05/03/2021   ANIONGAP 10 04/23/2020   EGFR 79 05/03/2021   Lab Results  Component Value Date   CHOL 150 05/03/2021   Lab Results  Component Value Date   HDL 31 (L) 05/03/2021   Lab Results  Component Value Date   LDLCALC 64 05/03/2021   Lab Results  Component Value Date   TRIG 351 (H) 05/03/2021   Lab Results  Component Value Date   CHOLHDL 4.8 05/03/2021   Lab Results  Component Value Date   HGBA1C 6.5 (H) 05/03/2021      Assessment & Plan:   Problem List Items Addressed This Visit       Cardiovascular and Mediastinum   HTN (hypertension) - Primary    BP Readings from Last 1 Encounters:  04/05/22 (!) 146/102  Uncontrolled, refilled Amlodipine 5 mg QD Added Losartan 50 mg QD, check CMP after 2 weeks Counseled for compliance with the medications Advised DASH diet and moderate exercise/walking, at least 150 mins/week       Relevant Medications   losartan (COZAAR) 50 MG tablet   amLODipine (NORVASC) 5 MG tablet   atorvastatin (LIPITOR) 20 MG tablet   Other Relevant Orders   CMP14+EGFR     Endocrine   Subclinical hyperthyroidism    Lab Results  Component Value Date   TSH 0.333 (L) 05/03/2021  Normal free T4 Will recheck TSH and free T4      Relevant Orders   TSH + free T4     Nervous and Auditory   Peripheral polyneuropathy    Started Gabapentin Has prediabetes, could be related to insulin resistance       Relevant Medications   gabapentin (NEURONTIN) 100 MG capsule     Musculoskeletal and Integument   Hip arthritis    Undergoing evaluation for THA         Other   Prediabetes    Lab Results  Component Value Date   HGBA1C 6.5 (H) 05/03/2021  Advised to strictly follow low carb diet, patient agrees.      Relevant Orders   CMP14+EGFR   Hemoglobin A1c   Mixed hyperlipidemia    Lipid profile  reviewed Started Atorvastatin       Relevant Medications   losartan (COZAAR) 50 MG tablet   amLODipine (NORVASC) 5 MG tablet   atorvastatin (LIPITOR) 20 MG tablet   Other Visit Diagnoses     Special screening for malignant neoplasms, colon       Relevant Orders   Ambulatory referral to Gastroenterology       Meds ordered this encounter  Medications   losartan (COZAAR) 50 MG tablet    Sig: Take 1 tablet (50 mg total) by mouth daily.    Dispense:  30 tablet    Refill:  3   amLODipine (NORVASC) 5 MG tablet    Sig: Take 1 tablet (5 mg total) by mouth daily.    Dispense:  30 tablet    Refill:  3   atorvastatin (LIPITOR) 20 MG tablet    Sig: Take 1 tablet (20 mg total) by mouth daily.    Dispense:  90 tablet    Refill:  1   gabapentin (NEURONTIN) 100 MG capsule    Sig: Take 1 capsule (100 mg total) by mouth 3 (three) times daily.    Dispense:  90 capsule    Refill:  3    Follow-up: Return in about 4 weeks (around 05/03/2022) for HTN.    Lindell Spar, MD

## 2022-04-05 NOTE — Assessment & Plan Note (Signed)
Undergoing evaluation for THA 

## 2022-04-05 NOTE — Assessment & Plan Note (Signed)
Started Gabapentin Has prediabetes, could be related to insulin resistance 

## 2022-04-16 ENCOUNTER — Other Ambulatory Visit: Payer: Self-pay | Admitting: Internal Medicine

## 2022-04-16 DIAGNOSIS — I10 Essential (primary) hypertension: Secondary | ICD-10-CM

## 2022-04-19 LAB — HEMOGLOBIN A1C
Est. average glucose Bld gHb Est-mCnc: 126 mg/dL
Hgb A1c MFr Bld: 6 % — ABNORMAL HIGH (ref 4.8–5.6)

## 2022-04-19 LAB — CMP14+EGFR
ALT: 44 IU/L (ref 0–44)
AST: 34 IU/L (ref 0–40)
Albumin/Globulin Ratio: 1.7 (ref 1.2–2.2)
Albumin: 4.3 g/dL (ref 3.8–4.9)
Alkaline Phosphatase: 104 IU/L (ref 44–121)
BUN/Creatinine Ratio: 13 (ref 9–20)
BUN: 16 mg/dL (ref 6–24)
Bilirubin Total: 0.4 mg/dL (ref 0.0–1.2)
CO2: 24 mmol/L (ref 20–29)
Calcium: 9.7 mg/dL (ref 8.7–10.2)
Chloride: 100 mmol/L (ref 96–106)
Creatinine, Ser: 1.23 mg/dL (ref 0.76–1.27)
Globulin, Total: 2.6 g/dL (ref 1.5–4.5)
Glucose: 154 mg/dL — ABNORMAL HIGH (ref 70–99)
Potassium: 4.8 mmol/L (ref 3.5–5.2)
Sodium: 139 mmol/L (ref 134–144)
Total Protein: 6.9 g/dL (ref 6.0–8.5)
eGFR: 70 mL/min/{1.73_m2} (ref >59)

## 2022-04-19 LAB — TSH+FREE T4
Free T4: 1.02 ng/dL (ref 0.82–1.77)
TSH: 0.821 u[IU]/mL (ref 0.450–4.500)

## 2022-04-20 ENCOUNTER — Telehealth: Payer: Self-pay | Admitting: Radiology

## 2022-04-20 NOTE — Telephone Encounter (Signed)
No I don't we had this happen last year and the year before as well, we set him up and he cancels day of surgery, or just before If you can't get them.

## 2022-04-20 NOTE — Telephone Encounter (Signed)
-----   Message from Nobie Putnam sent at 04/20/2022 12:07 PM EDT ----- Elvina Sidle,  This patient is scheduled for 7/11 and when I call his number it says this call can not be completed as dialed.  I have tried dialing it several ways.  Do you want to try to contact him?  Let me know how we should proceed.  Thanks,  Eber Jones

## 2022-04-26 ENCOUNTER — Other Ambulatory Visit: Payer: Self-pay | Admitting: Orthopedic Surgery

## 2022-04-26 DIAGNOSIS — M167 Other unilateral secondary osteoarthritis of hip: Secondary | ICD-10-CM

## 2022-04-26 NOTE — Addendum Note (Signed)
Addended byCaffie Damme on: 04/26/2022 10:42 AM   Modules accepted: Orders

## 2022-05-01 ENCOUNTER — Other Ambulatory Visit: Payer: Self-pay | Admitting: Internal Medicine

## 2022-05-01 ENCOUNTER — Encounter: Payer: Self-pay | Admitting: Internal Medicine

## 2022-05-01 ENCOUNTER — Ambulatory Visit (INDEPENDENT_AMBULATORY_CARE_PROVIDER_SITE_OTHER): Payer: 59 | Admitting: Internal Medicine

## 2022-05-01 VITALS — BP 122/84 | HR 78 | Resp 18 | Ht 71.0 in | Wt 189.6 lb

## 2022-05-01 DIAGNOSIS — I10 Essential (primary) hypertension: Secondary | ICD-10-CM

## 2022-05-01 DIAGNOSIS — M1610 Unilateral primary osteoarthritis, unspecified hip: Secondary | ICD-10-CM

## 2022-05-01 DIAGNOSIS — G629 Polyneuropathy, unspecified: Secondary | ICD-10-CM

## 2022-05-01 DIAGNOSIS — Z720 Tobacco use: Secondary | ICD-10-CM

## 2022-05-01 MED ORDER — GABAPENTIN 300 MG PO CAPS: 300.0000 mg | ORAL_CAPSULE | Freq: Two times a day (BID) | ORAL | 5 refills | Status: DC

## 2022-05-01 MED ORDER — TELMISARTAN 20 MG PO TABS: 20.0000 mg | ORAL_TABLET | Freq: Every day | ORAL | 3 refills | Status: DC

## 2022-05-04 ENCOUNTER — Telehealth: Payer: Self-pay | Admitting: *Deleted

## 2022-05-04 NOTE — Telephone Encounter (Signed)
Received triage questionnaire from pt. Pt was no show for appt with kristen for OV triage due to drug use. Please schedule OV. thanks

## 2022-05-05 ENCOUNTER — Encounter: Payer: Self-pay | Admitting: Internal Medicine

## 2022-05-10 ENCOUNTER — Encounter: Payer: Self-pay | Admitting: Orthopedic Surgery

## 2022-05-10 ENCOUNTER — Ambulatory Visit (INDEPENDENT_AMBULATORY_CARE_PROVIDER_SITE_OTHER): Payer: 59 | Admitting: Orthopedic Surgery

## 2022-05-10 VITALS — BP 153/96 | HR 83 | Ht 71.0 in | Wt 189.0 lb

## 2022-05-10 DIAGNOSIS — Z01818 Encounter for other preprocedural examination: Secondary | ICD-10-CM

## 2022-05-10 DIAGNOSIS — M167 Other unilateral secondary osteoarthritis of hip: Secondary | ICD-10-CM

## 2022-05-10 NOTE — Patient Instructions (Signed)
Total Hip Replacement Total hip replacement is a surgical procedure to remove damaged bone in your hip joint and replace it with an artificial hip joint (prosthetic hip joint). The purpose of this surgery is to reduce pain and to improve your hip function. During a total hip replacement, one or both parts of the hip joint are replaced, depending on the type of joint damage you have. The hip is a ball-and-socket type of joint, and it has two main parts. The ball part of the joint (femoral head) is the top of the thigh bone (femur). The socket part of the joint is a large indent in the side of your pelvis (acetabulum) where the femur and pelvis meet. Tell a health care provider about:  Any allergies you have.  All medicines you are taking, including vitamins, herbs, eye drops, creams, and over-the-counter medicines.  Any problems you or family members have had with anesthetic medicines.  Any blood disorders you have.  Any surgeries you have had.  Any medical conditions you have. What are the risks? Generally, total hip replacement is a safe procedure. However, problems can occur, including:  Infection.  Dislocation (the ball of the hip-joint prosthesis comes out of contact with the socket).  Loosening of the piece (stem) that connects the prosthetic femoral head to the femur.  Fracture of the bone while inserting the prosthesis.  Formation of blood clots, which can break loose and travel to and injure your lungs (pulmonary embolus).  What happens before the procedure?  Plan to have someone take you home after the procedure.  Do not eat or drink anything after midnight on the night before the procedure or as directed by your health care provider.  Ask your health care provider about: ? Changing or stopping your regular medicines. This is especially important if you are taking diabetes medicines or blood thinners. ? Taking medicines such as aspirin and ibuprofen. These medicines can  thin your blood. Do not take these medicines before your procedure if your health care provider asks you not to.  Ask your health care provider about how your surgical site will be marked or identified.  You may be given antibiotic medicines to help prevent infection. What happens during the procedure?  To reduce your risk of infection: ? Your health care team will wash or sanitize their hands. ? Your skin will be washed with soap.  An IV tube will be inserted into one of your veins. You will be given one or more of the following: ? A medicine that makes you drowsy (sedative). ? A medicine that makes you fall asleep (general anesthetic). ? A medicine injected into your spine that numbs your body below the waist (spinal anesthetic).  An incision will be made in your hip. Your surgeon will take out any damaged cartilage and bone.  Your surgeon will then: ? Insert a prosthetic socket into the acetabulum of your pelvis. This is usually secured with screws. ? Remove the femoral head and replace it with a prosthetic ball and stem secured into the top of your femur. ? Place the ball into the socket and check the range of motion and stability of your new hip. ? Close the incision and apply a bandage over the surgical site. What happens after the procedure?  You will stay in a recovery area until the medicines have worn off.  Your vital signs, such as your pulse and blood pressure, will be monitored.  Once you are awake and stable, you   will be taken to a hospital room.  You may be directed to take actions to help prevent blood clots. These may include: ? Walking soon after surgery, with someone assisting you. Moving around after surgery helps to improve blood flow. ? Taking medicines to thin your blood (anticoagulants). ? Wearing compression stockings or using different types of devices.  You will receive physical therapy until you are doing well and your health care provider feels it is  safe for you to go home. This information is not intended to replace advice given to you by your health care provider. Make sure you discuss any questions you have with your health care provider. Document Released: 01/29/2001 Document Revised: 06/26/2016 Document Reviewed: 12/24/2013 Elsevier Interactive Patient Education  2018 [employer] You have decided to proceed with hip replacement surgery. You have decided not to continue with nonoperative measures such as but not limited to oral medication, weight loss, activity modification, physical therapy, or injection.  We will perform the procedure commonly known as total hip replacement. Some of the risks associated with total hip replacement include but are not limited to Bleeding Infection Swelling Stiffness Blood clot Continued Pain Dislocation LEG LENGTH INEQUALITY REQUIRING A SHOE LIFT Persistent limp  Infection is a devastating complication of joint replacement surgery. If you get an infection the implant usually will have to be removed and several surgeries and antibiotics will be needed to eradicate the infection. In some rare cases the hip cannot be reconstructed with another implant. This will lead to permanent need for crutches and assistive devices to ambulate.   In compliance with recent [place] law in federal regulation regarding opioid use and abuse and addiction, we will taper (stop) opioid medication to avoid dependence and addiction. The goal is to have you on no opioid medication by week 4    If you're not comfortable with these risks and would like to continue with nonoperative treatment please let Dr. Stryder Poitra know prior to your surgery.  

## 2022-05-10 NOTE — Progress Notes (Signed)
Chief Complaint  Patient presents with   Pre-op Exam    Right hip replacement scheduled 05/16/22  David Knox  05/10/2022    ASSESSMENT AND PLAN:     Encounter Diagnosis  Name Primary?   Other secondary osteoarthritis of right hip Yes    The procedure has been fully reviewed with the patient; The risks and benefits of surgery have been discussed and explained and understood. Alternative treatment has also been reviewed, questions were encouraged and answered. The postoperative plan is also been reviewed.   Right total hip arthroplasty  Chief Complaint  Patient presents with   Pre-op Exam    Right hip replacement scheduled 05/16/22   HPI  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33 he did well he came back for progressive hip pain with progressive arthritis but he was smoking he stopped the smoking  He now presents for preop for right total hip After discussing the risk and benefits of surgery which include but were not limited to bleeding infection leg length discrepancy dislocation blood clot he agrees to proceed with surgery due to disabling pain and loss of function   HISTORY SECTION :   Current Outpatient Medications:    amLODipine (NORVASC) 5 MG tablet, Take 1 tablet (5 mg total) by mouth daily., Disp: 30 tablet, Rfl: 3   atorvastatin (LIPITOR) 20 MG tablet, Take 1 tablet (20 mg total) by mouth daily., Disp: 90 tablet, Rfl: 1   famotidine (PEPCID) 20 MG tablet, TAKE 1 TABLET BY MOUTH TWICE A DAY, Disp: 180 tablet, Rfl: 1   losartan (COZAAR) 50 MG tablet, Take 50 mg by mouth daily., Disp: , Rfl:    naproxen (NAPROSYN) 500 MG tablet, Take 1 tablet (500 mg total) by mouth 2 (two) times daily with a meal., Disp: 60 tablet, Rfl: 2   omeprazole (PRILOSEC OTC) 20 MG tablet, Take 20 mg by mouth daily., Disp: , Rfl:    gabapentin (NEURONTIN) 300 MG capsule, Take 1 capsule (300 mg total) by mouth 2 (two) times daily. (Patient  taking differently: Take 300 mg by mouth daily.), Disp: 60 capsule, Rfl: 5   telmisartan (MICARDIS) 20 MG tablet, Take 1 tablet (20 mg total) by mouth daily. (Patient not taking: Reported on 05/10/2022), Disp: 30 tablet, Rfl: 3  Current Facility-Administered Medications:    bupivacaine-meloxicam ER (ZYNRELEF) injection 400 mg, 400 mg, Infiltration, Once, Vickki Hearing, MD  No Known Allergies   ROS denies chest pain or shortness of breath no other problems on review of systems   has a past medical history of Acid reflux.   Past Surgical History:  Procedure Laterality Date   ESOPHAGOGASTRODUODENOSCOPY  2002   RMR: ulcerative reflux esophagitis, moderate sized hiatal hernia   HIP SURGERY     WRIST SURGERY      Social History   Socioeconomic History   Marital status: Single    Spouse name: Not on file   Number of children: Not on file   Years of education: Not on file   Highest education level: Not on file  Occupational History   Not on file  Tobacco Use   Smoking status: Former    Packs/day: 1.00    Types: Cigarettes    Quit date: 12/22/2021    Years since quitting: 0.3   Smokeless tobacco: Never  Vaping Use   Vaping Use: Never used  Substance and Sexual Activity   Alcohol use: Yes  Comment: beer and liquor twice weekly   Drug use: No    Comment: "crack" former   Sexual activity: Not on file  Other Topics Concern   Not on file  Social History Narrative   Not on file   Social Determinants of Health   Financial Resource Strain: Not on file  Food Insecurity: Not on file  Transportation Needs: Not on file  Physical Activity: Not on file  Stress: Not on file  Social Connections: Not on file  Intimate Partner Violence: Not on file     Family History  Problem Relation Age of Onset   Diabetes Mother    Heart disease Mother    Diabetes Father    Heart disease Father    Colon cancer Neg Hx       PHYSICAL EXAM SECTION: BP (!) 153/96   Pulse 83   Ht  5\' 11"  (1.803 m)   Wt 189 lb (85.7 kg)   BMI 26.36 kg/m   Body mass index is 26.36 kg/m.   General appearance: Well-developed well-nourished no gross deformities  Eyes clear normal vision no evidence of conjunctivitis or jaundice, extraocular muscles intact  ENT: ears hearing normal, nasal passages clear, throat clear   Neck is supple without palpable mass, full range of motion   Cardiovascular normal pulse and perfusion in all 4 extremities normal color without edema  Lymph nodes: No lymphadenopathy  Neurologically deep tendon reflexes are equal and normal, no sensation loss or deficits no pathologic reflexes   Skin no lacerations or ulcerations no nodularity no palpable masses, no erythema or nodularity  Psychological: Awake alert and oriented x3 mood and affect normal  Musculoskeletal: Limited range of motion right hip leg lengths are equal skin normal strength hip flexion normal  Imaging: I personally read the images and my interpretation is prior images show significant pistol-grip deformity of the right hip with osteoarthritis surrounding osteophytes no gross bone defects  12:48 PM  

## 2022-05-11 NOTE — Patient Instructions (Signed)
David Knox  05/11/2022     @PREFPERIOPPHARMACY @   Your procedure is scheduled on  05/16/2022.   Report to 07/17/2022 at  0825 A.M.   Call this number if you have problems the morning of surgery:  [phone]   Remember:  Do not eat or drink after midnight.    Take these medicines the morning of surgery with A SIP OF WATER                    norvasc, pepcid, neurontin, prilosec.     Do not wear jewelry, make-up or nail polish.  Do not wear lotions, powders, or perfumes, or deodorant.  Do not shave 48 hours prior to surgery.  Men may shave face and neck.  Do not bring valuables to the hospital.  [facility] is not responsible for any belongings or valuables.  Contacts, dentures or bridgework may not be worn into surgery.  Leave your suitcase in the car.  After surgery it may be brought to your room.  For patients admitted to the hospital, discharge time will be determined by your treatment team.  Patients discharged the day of surgery will not be allowed to drive home and must have someone with them for 24 hours.    Special instructions:   DO NOT smoke tobacco or vape for 24 hours before your procedure.  Please read over the following fact sheets that you were given. Pain Booklet, Coughing and Deep Breathing, Blood Transfusion Information, Surgical Site Infection Prevention, Anesthesia Post-op Instructions, and Care and Recovery After Surgery      Total Knee Replacement, Care After This sheet gives you information about how to care for yourself after your procedure. Your health care provider may also give you more specific instructions. If you have problems or questions, contact your health care provider. What can I expect after the procedure? After the procedure, it is common to have: Redness, pain, and swelling at the incision area. Stiffness. Discomfort. A small amount of blood or clear fluid coming from your incision. Follow these  instructions at home: Medicines Take over-the-counter and prescription medicines only as told by your health care provider. If you were prescribed a blood thinner (anticoagulant), take it as told by your health care provider. Ask your health care provider if the medicine prescribed to you: Requires you to avoid driving or using machinery. Can cause constipation. You may need to take these actions to prevent or treat constipation: Drink enough fluid to keep your urine pale yellow. Take over-the-counter or prescription medicines. Eat foods that are high in fiber, such as beans, whole grains, and fresh fruits and vegetables. Limit foods that are high in fat and processed sugars, such as fried or sweet foods. Incision care  Follow instructions from your health care provider about how to take care of your incision. Make sure you: Wash your hands with soap and water for at least 20 seconds before and after you change your bandage (dressing). If soap and water are not available, use hand sanitizer. Change your dressing as told by your health care provider. Leave stitches (sutures), staples, skin glue, or adhesive strips in place. These skin closures may need to stay in place for 2 weeks or longer. If adhesive strip edges start to loosen and curl up, you may trim the loose edges. Do not remove adhesive strips completely unless your health care provider tells you to do that. Do  not take baths, swim, or use a hot tub until your health care provider approves. Check your incision area every day for signs of infection. Check for: More redness, swelling, or pain. More fluid or blood. Warmth. Pus or a bad smell. Activity Rest as told by your health care provider. Avoid sitting for a long time without moving. Get up to take short walks every 1-2 hours. This is important to improve blood flow and breathing. Ask for help if you feel weak or unsteady. Follow instructions from your health care provider about  using a walker, crutches, or a cane. You may use your legs to support (bear) your body weight as told by your health care provider. Follow instructions about how much weight you may safely support on your affected leg (weight-bearing restrictions). A physical therapist may show you how to get out of a bed and chair and how to go up and down stairs. You will first do this with a walker, crutches, or a cane and then without any of these devices. Once you are able to walk without a limp, you may stop using a walker, crutches, or a cane. Do exercises as told by your health care provider or physical therapist. Avoid high-impact activities, including running, jumping rope, and doing jumping jacks. Do not play contact sports until your health care provider approves. Return to your normal activities as told by your health care provider. Ask your health care provider what activities are safe for you. Managing pain, stiffness, and swelling  If directed, put ice on your knee. To do this: Put ice in a plastic bag or use the icing device (cold flow pad) that you were given. Follow instructions from your health care provider about how to use the icing device. Place a towel between your skin and the bag or between your skin and the icing device. Leave the ice on for 20 minutes, 2-3 times a day. Remove the ice if your skin turns bright red. This is very important. If you cannot feel pain, heat, or cold, you have a greater risk of damage to the area. Move your toes often to reduce stiffness and swelling. Raise (elevate) your leg above the level of your heart while you are sitting or lying down. Use several pillows to keep your leg straight. Do not put a pillow just under the knee. If the knee is bent for a long time, this may lead to stiffness. Wear elastic knee support as told by your health care provider. Safety  To help prevent falls, keep floors clear of objects you may trip over. Place items that you may  need within easy reach. Wear an apron or tool belt with pockets for carrying objects. This leaves your hands free to help with your balance. Ask your health care provider when it is safe to drive. General instructions Wear compression stockings as told by your health care provider. These stockings help to prevent blood clots and reduce swelling in your legs. Continue with breathing exercises. This helps prevent lung infection. Do not use any products that contain nicotine or tobacco. These products include cigarettes, chewing tobacco, and vaping devices, such as e-cigarettes. These can delay healing after surgery. If you need help quitting, ask your health care provider. Tell your health care provider if you plan to have dental work. Also: Tell your dentist about your joint replacement. Ask your health care provider if there are any special instructions you need to follow before having dental care and routine cleanings.  Keep all follow-up visits. This is important. Contact a health care provider if: You have a fever or chills. You have a cough or feel short of breath. Your medicine is not controlling your pain. You have any of these signs of infection: More redness, swelling, or pain around your incision. More fluid or blood coming from your incision. Warmth coming from your incision. Pus or a bad smell coming from your incision. You fall. Get help right away if: You have severe pain. You have trouble breathing. You have chest pain. You have redness, swelling, pain, or warmth in your calf or leg. Your incision breaks open after sutures or staples are removed. These symptoms may represent a serious problem that is an emergency. Do not wait to see if the symptoms will go away. Get medical help right away. Call your local emergency services (911 in the [place] Do not drive yourself to the hospital. Summary After the procedure, it is common to have pain and swelling at the incision area, a  small amount of blood or fluid coming from your incision, and stiffness. Follow instructions from your health care provider about how to take care of your incision. Use crutches, a walker, or a cane as told by your health care provider. This information is not intended to replace advice given to you by your health care provider. Make sure you discuss any questions you have with your health care provider. Document Revised: 04/13/2020 Document Reviewed: 04/13/2020 Elsevier Patient Education  2023 [employer] Spinal Anesthesia and Epidural Anesthesia, Care After This sheet gives you information about how to care for yourself after your procedure. Your doctor may also give you more specific instructions. If you have problems or questions, contact your doctor. What can I expect after the procedure? While the medicines you were given are still having effects, it is common to: Feel like you may vomit (nauseous). Vomit. Have a numb feeling or tingling in your legs. Have problems when you pee (urinate). Feel itchy. Follow these instructions at home: For the time period you were told by your doctor:  Rest. Do not do activities where you could fall or get hurt. Do not drive or use machines. Do not drink alcohol. Do not take sleeping pills or medicines that make you drowsy. Do not make big decisions or sign legal documents. Do not take care of children on your own. Eating and drinking If you vomit, wait a short time. When you can drink without vomiting, try to drink some water, juice, or clear soup. Drink enough fluid to keep your pee (urine) pale yellow. Make sure you do not feel like vomiting before you eat solid foods. Follow the diet that your doctor recommends. General instructions If you have sleep apnea, surgery and certain medicines can raise your risk for breathing problems. Follow instructions from your doctor about when to wear your sleep device. Have a responsible adult stay with  you for the time you are told. It is important to have someone help care for you until you are awake and alert. Return to your normal activities as told by your doctor. Ask your doctor what activities are safe for you. Take over-the-counter and prescription medicines only as told by your doctor. Do not use any products that contain nicotine or tobacco, such as cigarettes, e-cigarettes, and chewing tobacco. If you need help quitting, ask your doctor. Keep all follow-up visits as told by your doctor. This is important. Contact a doctor if: It has been more than  one day since your procedure and you feel like you may vomit or you are vomiting. You have a rash. Get help right away if: You have a fever. You have a headache that lasts a long time. You have a very bad headache. Your vision is blurry or you see two of a single object (double vision). You are dizzy or light-headed. You faint. Your arms or legs tingle, feel weak, or get numb. You have trouble breathing. You cannot pee. These symptoms may be an emergency. Get help right away. Call your local emergency services (911 in the [place] Do not wait to see if the symptoms will go away. Do not drive yourself to the hospital. Summary After the procedure, have a responsible adult stay with you at home. Do not do activities that might cause you to get hurt. Do not drive, use machinery, drink alcohol, or make important decisions as told by your doctor. Do not use products that contain nicotine or tobacco. Get help right away if you have a very bad headache, trouble breathing, or you cannot pee. This information is not intended to replace advice given to you by your health care provider. Make sure you discuss any questions you have with your health care provider. Document Revised: 07/08/2020 Document Reviewed: 12/11/2019 Elsevier Patient Education  2023 [employer] General Anesthesia, Adult, Care After This sheet gives you information about  how to care for yourself after your procedure. Your health care provider may also give you more specific instructions. If you have problems or questions, contact your health care provider. What can I expect after the procedure? After the procedure, the following side effects are common: Pain or discomfort at the IV site. Nausea. Vomiting. Sore throat. Trouble concentrating. Feeling cold or chills. Feeling weak or tired. Sleepiness and fatigue. Soreness and body aches. These side effects can affect parts of the body that were not involved in surgery. Follow these instructions at home: For the time period you were told by your health care provider:  Rest. Do not participate in activities where you could fall or become injured. Do not drive or use machinery. Do not drink alcohol. Do not take sleeping pills or medicines that cause drowsiness. Do not make important decisions or sign legal documents. Do not take care of children on your own. Eating and drinking Follow any instructions from your health care provider about eating or drinking restrictions. When you feel hungry, start by eating small amounts of foods that are soft and easy to digest (bland), such as toast. Gradually return to your regular diet. Drink enough fluid to keep your urine pale yellow. If you vomit, rehydrate by drinking water, juice, or clear broth. General instructions If you have sleep apnea, surgery and certain medicines can increase your risk for breathing problems. Follow instructions from your health care provider about wearing your sleep device: Anytime you are sleeping, including during daytime naps. While taking prescription pain medicines, sleeping medicines, or medicines that make you drowsy. Have a responsible adult stay with you for the time you are told. It is important to have someone help care for you until you are awake and alert. Return to your normal activities as told by your health care provider.  Ask your health care provider what activities are safe for you. Take over-the-counter and prescription medicines only as told by your health care provider. If you smoke, do not smoke without supervision. Keep all follow-up visits as told by your health care provider. This is important.  Contact a health care provider if: You have nausea or vomiting that does not get better with medicine. You cannot eat or drink without vomiting. You have pain that does not get better with medicine. You are unable to pass urine. You develop a skin rash. You have a fever. You have redness around your IV site that gets worse. Get help right away if: You have difficulty breathing. You have chest pain. You have blood in your urine or stool, or you vomit blood. Summary After the procedure, it is common to have a sore throat or nausea. It is also common to feel tired. Have a responsible adult stay with you for the time you are told. It is important to have someone help care for you until you are awake and alert. When you feel hungry, start by eating small amounts of foods that are soft and easy to digest (bland), such as toast. Gradually return to your regular diet. Drink enough fluid to keep your urine pale yellow. Return to your normal activities as told by your health care provider. Ask your health care provider what activities are safe for you. This information is not intended to replace advice given to you by your health care provider. Make sure you discuss any questions you have with your health care provider. Document Revised: 07/08/2020 Document Reviewed: 02/05/2020 Elsevier Patient Education  2023 [employer] How to Use Chlorhexidine for Bathing Chlorhexidine gluconate (CHG) is a germ-killing (antiseptic) solution that is used to clean the skin. It can get rid of the bacteria that normally live on the skin and can keep them away for about 24 hours. To clean your skin with CHG, you may be given: A CHG  solution to use in the shower or as part of a sponge bath. A prepackaged cloth that contains CHG. Cleaning your skin with CHG may help lower the risk for infection: While you are staying in the intensive care unit of the hospital. If you have a vascular access, such as a central line, to provide short-term or long-term access to your veins. If you have a catheter to drain urine from your bladder. If you are on a ventilator. A ventilator is a machine that helps you breathe by moving air in and out of your lungs. After surgery. What are the risks? Risks of using CHG include: A skin reaction. Hearing loss, if CHG gets in your ears and you have a perforated eardrum. Eye injury, if CHG gets in your eyes and is not rinsed out. The CHG product catching fire. Make sure that you avoid smoking and flames after applying CHG to your skin. Do not use CHG: If you have a chlorhexidine allergy or have previously reacted to chlorhexidine. On babies younger than 37 months of age. How to use CHG solution Use CHG only as told by your health care provider, and follow the instructions on the label. Use the full amount of CHG as directed. Usually, this is one bottle. During a shower Follow these steps when using CHG solution during a shower (unless your health care provider gives you different instructions): Start the shower. Use your normal soap and shampoo to wash your face and hair. Turn off the shower or move out of the shower stream. Pour the CHG onto a clean washcloth. Do not use any type of brush or rough-edged sponge. Starting at your neck, lather your body down to your toes. Make sure you follow these instructions: If you will be having surgery, pay special  attention to the part of your body where you will be having surgery. Scrub this area for at least 1 minute. Do not use CHG on your head or face. If the solution gets into your ears or eyes, rinse them well with water. Avoid your genital  area. Avoid any areas of skin that have broken skin, cuts, or scrapes. Scrub your back and under your arms. Make sure to wash skin folds. Let the lather sit on your skin for 1-2 minutes or as long as told by your health care provider. Thoroughly rinse your entire body in the shower. Make sure that all body creases and crevices are rinsed well. Dry off with a clean towel. Do not put any substances on your body afterward--such as powder, lotion, or perfume--unless you are told to do so by your health care provider. Only use lotions that are recommended by the manufacturer. Put on clean clothes or pajamas. If it is the night before your surgery, sleep in clean sheets.  During a sponge bath Follow these steps when using CHG solution during a sponge bath (unless your health care provider gives you different instructions): Use your normal soap and shampoo to wash your face and hair. Pour the CHG onto a clean washcloth. Starting at your neck, lather your body down to your toes. Make sure you follow these instructions: If you will be having surgery, pay special attention to the part of your body where you will be having surgery. Scrub this area for at least 1 minute. Do not use CHG on your head or face. If the solution gets into your ears or eyes, rinse them well with water. Avoid your genital area. Avoid any areas of skin that have broken skin, cuts, or scrapes. Scrub your back and under your arms. Make sure to wash skin folds. Let the lather sit on your skin for 1-2 minutes or as long as told by your health care provider. Using a different clean, wet washcloth, thoroughly rinse your entire body. Make sure that all body creases and crevices are rinsed well. Dry off with a clean towel. Do not put any substances on your body afterward--such as powder, lotion, or perfume--unless you are told to do so by your health care provider. Only use lotions that are recommended by the manufacturer. Put on clean  clothes or pajamas. If it is the night before your surgery, sleep in clean sheets. How to use CHG prepackaged cloths Only use CHG cloths as told by your health care provider, and follow the instructions on the label. Use the CHG cloth on clean, dry skin. Do not use the CHG cloth on your head or face unless your health care provider tells you to. When washing with the CHG cloth: Avoid your genital area. Avoid any areas of skin that have broken skin, cuts, or scrapes. Before surgery Follow these steps when using a CHG cloth to clean before surgery (unless your health care provider gives you different instructions): Using the CHG cloth, vigorously scrub the part of your body where you will be having surgery. Scrub using a back-and-forth motion for 3 minutes. The area on your body should be completely wet with CHG when you are done scrubbing. Do not rinse. Discard the cloth and let the area air-dry. Do not put any substances on the area afterward, such as powder, lotion, or perfume. Put on clean clothes or pajamas. If it is the night before your surgery, sleep in clean sheets.  For general bathing Follow  these steps when using CHG cloths for general bathing (unless your health care provider gives you different instructions). Use a separate CHG cloth for each area of your body. Make sure you wash between any folds of skin and between your fingers and toes. Wash your body in the following order, switching to a new cloth after each step: The front of your neck, shoulders, and chest. Both of your arms, under your arms, and your hands. Your stomach and groin area, avoiding the genitals. Your right leg and foot. Your left leg and foot. The back of your neck, your back, and your buttocks. Do not rinse. Discard the cloth and let the area air-dry. Do not put any substances on your body afterward--such as powder, lotion, or perfume--unless you are told to do so by your health care provider. Only use  lotions that are recommended by the manufacturer. Put on clean clothes or pajamas. Contact a health care provider if: Your skin gets irritated after scrubbing. You have questions about using your solution or cloth. You swallow any chlorhexidine. Call your local poison control center (727 613 0102 in the [place] Get help right away if: Your eyes itch badly, or they become very red or swollen. Your skin itches badly and is red or swollen. Your hearing changes. You have trouble seeing. You have swelling or tingling in your mouth or throat. You have trouble breathing. These symptoms may represent a serious problem that is an emergency. Do not wait to see if the symptoms will go away. Get medical help right away. Call your local emergency services (911 in the [place] Do not drive yourself to the hospital. Summary Chlorhexidine gluconate (CHG) is a germ-killing (antiseptic) solution that is used to clean the skin. Cleaning your skin with CHG may help to lower your risk for infection. You may be given CHG to use for bathing. It may be in a bottle or in a prepackaged cloth to use on your skin. Carefully follow your health care provider's instructions and the instructions on the product label. Do not use CHG if you have a chlorhexidine allergy. Contact your health care provider if your skin gets irritated after scrubbing. This information is not intended to replace advice given to you by your health care provider. Make sure you discuss any questions you have with your health care provider. Document Revised: 01/03/2021 Document Reviewed: 01/03/2021 Elsevier Patient Education  2023 [employer].

## 2022-05-11 NOTE — Addendum Note (Signed)
Addended byCaffie Damme on: 05/11/2022 10:55 AM   Modules accepted: Orders

## 2022-05-12 ENCOUNTER — Encounter (HOSPITAL_COMMUNITY)
Admission: RE | Admit: 2022-05-12 | Discharge: 2022-05-12 | Disposition: A | Discharge status: Home / Self Care | Payer: 59 | Source: Ambulatory Visit | Attending: Orthopedic Surgery | Admitting: Orthopedic Surgery

## 2022-05-12 ENCOUNTER — Encounter (HOSPITAL_COMMUNITY): Payer: Self-pay

## 2022-05-12 VITALS — BP 134/92 | HR 69 | Temp 97.8°F | Resp 18 | Ht 71.0 in | Wt 189.0 lb

## 2022-05-12 DIAGNOSIS — M167 Other unilateral secondary osteoarthritis of hip: Secondary | ICD-10-CM | POA: Insufficient documentation

## 2022-05-12 DIAGNOSIS — I10 Essential (primary) hypertension: Secondary | ICD-10-CM | POA: Diagnosis not present

## 2022-05-12 DIAGNOSIS — Z01818 Encounter for other preprocedural examination: Secondary | ICD-10-CM | POA: Diagnosis present

## 2022-05-12 DIAGNOSIS — M1711 Unilateral primary osteoarthritis, right knee: Secondary | ICD-10-CM | POA: Insufficient documentation

## 2022-05-12 LAB — TYPE AND SCREEN
ABO/RH(D): B POS
Antibody Screen: NEGATIVE

## 2022-05-12 LAB — CBC WITH DIFFERENTIAL/PLATELET
Abs Immature Granulocytes: 0.02 10*3/uL (ref 0.00–0.07)
Basophils Absolute: 0 10*3/uL (ref 0.0–0.1)
Basophils Relative: 0 %
Eosinophils Absolute: 0.2 10*3/uL (ref 0.0–0.5)
Eosinophils Relative: 2 %
HCT: 42.1 % (ref 39.0–52.0)
Hemoglobin: 13.9 g/dL (ref 13.0–17.0)
Immature Granulocytes: 0 %
Lymphocytes Relative: 36 %
Lymphs Abs: 2.4 10*3/uL (ref 0.7–4.0)
MCH: 31 pg (ref 26.0–34.0)
MCHC: 33 g/dL (ref 30.0–36.0)
MCV: 93.8 fL (ref 80.0–100.0)
Monocytes Absolute: 0.6 10*3/uL (ref 0.1–1.0)
Monocytes Relative: 10 %
Neutro Abs: 3.5 10*3/uL (ref 1.7–7.7)
Neutrophils Relative %: 52 %
Platelets: 309 10*3/uL (ref 150–400)
RBC: 4.49 MIL/uL (ref 4.22–5.81)
RDW: 12.2 % (ref 11.5–15.5)
WBC: 6.8 10*3/uL (ref 4.0–10.5)
nRBC: 0 % (ref 0.0–0.2)

## 2022-05-12 LAB — BASIC METABOLIC PANEL
Anion gap: 8 (ref 5–15)
BUN: 13 mg/dL (ref 6–20)
CO2: 26 mmol/L (ref 22–32)
Calcium: 8.8 mg/dL — ABNORMAL LOW (ref 8.9–10.3)
Chloride: 102 mmol/L (ref 98–111)
Creatinine, Ser: 1.12 mg/dL (ref 0.61–1.24)
GFR, Estimated: >60 mL/min (ref >60)
Glucose, Bld: 81 mg/dL (ref 70–99)
Potassium: 3.6 mmol/L (ref 3.5–5.1)
Sodium: 136 mmol/L (ref 135–145)

## 2022-05-12 LAB — PREPARE RBC (CROSSMATCH)

## 2022-05-15 NOTE — H&P (Signed)
Chief Complaint  Patient presents with   Pre-op Exam      Right hip replacement scheduled 05/16/22  David Knox   05/10/2022       ASSESSMENT AND PLAN:          Encounter Diagnosis  Name Primary?   Other secondary osteoarthritis of right hip Yes      The procedure has been fully reviewed with the patient; The risks and benefits of surgery have been discussed and explained and understood. Alternative treatment has also been reviewed, questions were encouraged and answered. The postoperative plan is also been reviewed.    Right total hip arthroplasty       Chief Complaint  Patient presents with   Pre-op Exam      Right hip replacement scheduled 05/16/22    HPI   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17 he did well he came back for progressive hip pain with progressive arthritis but he was smoking he stopped the smoking  He now presents for preop for right total hip After discussing the risk and benefits of surgery which include but were not limited to bleeding infection leg length discrepancy dislocation blood clot he agrees to proceed with surgery due to disabling pain and loss of function    HISTORY SECTION :     Current Outpatient Medications:    amLODipine (NORVASC) 5 MG tablet, Take 1 tablet (5 mg total) by mouth daily., Disp: 30 tablet, Rfl: 3   atorvastatin (LIPITOR) 20 MG tablet, Take 1 tablet (20 mg total) by mouth daily., Disp: 90 tablet, Rfl: 1   famotidine (PEPCID) 20 MG tablet, TAKE 1 TABLET BY MOUTH TWICE A DAY, Disp: 180 tablet, Rfl: 1   losartan (COZAAR) 50 MG tablet, Take 50 mg by mouth daily., Disp: , Rfl:    naproxen (NAPROSYN) 500 MG tablet, Take 1 tablet (500 mg total) by mouth 2 (two) times daily with a meal., Disp: 60 tablet, Rfl: 2   omeprazole (PRILOSEC OTC) 20 MG tablet, Take 20 mg by mouth daily., Disp: , Rfl:    gabapentin (NEURONTIN) 300 MG capsule, Take 1 capsule (300 mg total) by mouth 2  (two) times daily. (Patient taking differently: Take 300 mg by mouth daily.), Disp: 60 capsule, Rfl: 5   telmisartan (MICARDIS) 20 MG tablet, Take 1 tablet (20 mg total) by mouth daily. (Patient not taking: Reported on 05/10/2022), Disp: 30 tablet, Rfl: 3   Current Facility-Administered Medications:    bupivacaine-meloxicam ER (ZYNRELEF) injection 400 mg, 400 mg, Infiltration, Once, Vickki Hearing, MD   No Known Allergies     ROS denies chest pain or shortness of breath no other problems on review of systems    has a past medical history of Acid reflux.         Past Surgical History:  Procedure Laterality Date   ESOPHAGOGASTRODUODENOSCOPY   2002    RMR: ulcerative reflux esophagitis, moderate sized hiatal hernia   HIP SURGERY       WRIST SURGERY          Social History         Socioeconomic History   Marital status: Single      Spouse name: Not on file   Number of children: Not on file   Years of education: Not on file   Highest education level: Not on file  Occupational History   Not on file  Tobacco Use  Smoking status: Former      Packs/day: 1.00      Types: Cigarettes      Quit date: 12/22/2021      Years since quitting: 0.3   Smokeless tobacco: Never  Vaping Use   Vaping Use: Never used  Substance and Sexual Activity   Alcohol use: Yes      Comment: beer and liquor twice weekly   Drug use: No      Comment: "crack" former   Sexual activity: Not on file  Other Topics Concern   Not on file  Social History Narrative   Not on file    Social Determinants of Health    Financial Resource Strain: Not on file  Food Insecurity: Not on file  Transportation Needs: Not on file  Physical Activity: Not on file  Stress: Not on file  Social Connections: Not on file  Intimate Partner Violence: Not on file             Family History  Problem Relation Age of Onset   Diabetes Mother     Heart disease Mother     Diabetes Father     Heart disease Father      Colon cancer Neg Hx            PHYSICAL EXAM SECTION: BP (!) 153/96   Pulse 83   Ht 5\' 11"  (1.803 m)   Wt 189 lb (85.7 kg)   BMI 26.36 kg/m   Body mass index is 26.36 kg/m.     General appearance: Well-developed well-nourished no gross deformities   Eyes clear normal vision no evidence of conjunctivitis or jaundice, extraocular muscles intact   ENT: ears hearing normal, nasal passages clear, throat clear    Neck is supple without palpable mass, full range of motion    Cardiovascular normal pulse and perfusion in all 4 extremities normal color without edema   Lymph nodes: No lymphadenopathy  Neurologically deep tendon reflexes are equal and normal, no sensation loss or deficits no pathologic reflexes     Skin no lacerations or ulcerations no nodularity no palpable masses, no erythema or nodularity   Psychological: Awake alert and oriented x3 mood and affect normal   Musculoskeletal: Limited range of motion right hip leg lengths are equal skin normal strength hip flexion normal   Imaging: I personally read the images and my interpretation is prior images show significant pistol-grip deformity of the right hip with osteoarthritis surrounding osteophytes no gross bone defects

## 2022-05-16 ENCOUNTER — Ambulatory Visit (HOSPITAL_BASED_OUTPATIENT_CLINIC_OR_DEPARTMENT_OTHER): Payer: 59 | Admitting: Certified Registered"

## 2022-05-16 ENCOUNTER — Observation Stay (HOSPITAL_COMMUNITY)
Admission: RE | Admit: 2022-05-16 | Discharge: 2022-05-17 | Disposition: A | Discharge status: Home / Self Care | Payer: 59 | Attending: Orthopedic Surgery | Admitting: Orthopedic Surgery

## 2022-05-16 ENCOUNTER — Other Ambulatory Visit: Payer: Self-pay

## 2022-05-16 ENCOUNTER — Encounter (HOSPITAL_COMMUNITY)
Admission: RE | Disposition: A | Discharge status: Home / Self Care | Payer: Self-pay | Source: Home / Self Care | Attending: Orthopedic Surgery

## 2022-05-16 ENCOUNTER — Ambulatory Visit (HOSPITAL_COMMUNITY): Payer: 59

## 2022-05-16 ENCOUNTER — Encounter (HOSPITAL_COMMUNITY): Payer: Self-pay | Admitting: Orthopedic Surgery

## 2022-05-16 ENCOUNTER — Ambulatory Visit (HOSPITAL_COMMUNITY): Payer: 59 | Admitting: Certified Registered"

## 2022-05-16 DIAGNOSIS — M167 Other unilateral secondary osteoarthritis of hip: Secondary | ICD-10-CM

## 2022-05-16 DIAGNOSIS — Z79899 Other long term (current) drug therapy: Secondary | ICD-10-CM | POA: Insufficient documentation

## 2022-05-16 DIAGNOSIS — Z87891 Personal history of nicotine dependence: Secondary | ICD-10-CM | POA: Insufficient documentation

## 2022-05-16 DIAGNOSIS — M1611 Unilateral primary osteoarthritis, right hip: Secondary | ICD-10-CM

## 2022-05-16 HISTORY — PX: TOTAL HIP ARTHROPLASTY: SHX124

## 2022-05-16 SURGERY — ARTHROPLASTY, HIP, TOTAL,POSTERIOR APPROACH
Anesthesia: General | Site: Hip | Laterality: Right

## 2022-05-16 SURGERY — ARTHROPLASTY, HIP, TOTAL,POSTERIOR APPROACH
Anesthesia: Choice | Site: Hip | Laterality: Right

## 2022-05-16 MED ORDER — ONDANSETRON HCL 4 MG PO TABS: 4.0000 mg | ORAL_TABLET | Freq: Four times a day (QID) | ORAL | Status: DC | PRN

## 2022-05-16 MED ORDER — PHENYLEPHRINE HCL (PRESSORS) 10 MG/ML IV SOLN
INTRAVENOUS | Status: DC | PRN
  Administered 2022-05-16: 160 ug via INTRAVENOUS
  Administered 2022-05-16: 80 ug via INTRAVENOUS
  Administered 2022-05-16 (×3): 160 ug via INTRAVENOUS
  Administered 2022-05-16: 80 ug via INTRAVENOUS
  Administered 2022-05-16: 160 ug via INTRAVENOUS
  Administered 2022-05-16: 80 ug via INTRAVENOUS
  Administered 2022-05-16: 160 ug via INTRAVENOUS
  Administered 2022-05-16: 80 ug via INTRAVENOUS

## 2022-05-16 MED ORDER — KETOROLAC TROMETHAMINE 30 MG/ML IJ SOLN
INTRAMUSCULAR | Status: AC
  Filled 2022-05-16: qty 2

## 2022-05-16 MED ORDER — OXYCODONE HCL 5 MG PO TABS
5.0000 mg | ORAL_TABLET | ORAL | Status: DC | PRN
  Administered 2022-05-17 (×2): 5 mg via ORAL
  Filled 2022-05-16 (×2): qty 1

## 2022-05-16 MED ORDER — ROCURONIUM BROMIDE 100 MG/10ML IV SOLN
INTRAVENOUS | Status: DC | PRN
  Administered 2022-05-16: 20 mg via INTRAVENOUS
  Administered 2022-05-16: 50 mg via INTRAVENOUS
  Administered 2022-05-16: 20 mg via INTRAVENOUS

## 2022-05-16 MED ORDER — ACETAMINOPHEN 500 MG PO TABS
1000.0000 mg | ORAL_TABLET | Freq: Four times a day (QID) | ORAL | Status: AC
  Administered 2022-05-16 – 2022-05-17 (×4): 1000 mg via ORAL
  Filled 2022-05-16 (×4): qty 2

## 2022-05-16 MED ORDER — HYDROMORPHONE HCL 1 MG/ML IJ SOLN: 0.5000 mg | INTRAMUSCULAR | Status: DC | PRN

## 2022-05-16 MED ORDER — DEXAMETHASONE SODIUM PHOSPHATE 10 MG/ML IJ SOLN
INTRAMUSCULAR | Status: DC | PRN
  Administered 2022-05-16: 10 mg via INTRAVENOUS

## 2022-05-16 MED ORDER — MENTHOL 3 MG MT LOZG
1.0000 | LOZENGE | OROMUCOSAL | Status: DC | PRN
Start: 2022-05-16 — End: 2022-05-17

## 2022-05-16 MED ORDER — ACETAMINOPHEN 10 MG/ML IV SOLN
INTRAVENOUS | Status: AC
  Filled 2022-05-16: qty 100

## 2022-05-16 MED ORDER — KETOROLAC TROMETHAMINE 30 MG/ML IJ SOLN
INTRAMUSCULAR | Status: DC | PRN
  Administered 2022-05-16: 30 mg via INTRAVENOUS

## 2022-05-16 MED ORDER — LIDOCAINE HCL (PF) 2 % IJ SOLN
INTRAMUSCULAR | Status: AC
  Filled 2022-05-16: qty 5

## 2022-05-16 MED ORDER — ACETAMINOPHEN 325 MG PO TABS: 325.0000 mg | ORAL_TABLET | Freq: Four times a day (QID) | ORAL | Status: DC | PRN

## 2022-05-16 MED ORDER — PHENOL 1.4 % MT LIQD: 1.0000 | OROMUCOSAL | Status: DC | PRN

## 2022-05-16 MED ORDER — SUGAMMADEX SODIUM 200 MG/2ML IV SOLN
INTRAVENOUS | Status: DC | PRN
  Administered 2022-05-16: 200 mg via INTRAVENOUS

## 2022-05-16 MED ORDER — ALUM & MAG HYDROXIDE-SIMETH 200-200-20 MG/5ML PO SUSP: 30.0000 mL | ORAL | Status: DC | PRN

## 2022-05-16 MED ORDER — TRANEXAMIC ACID-NACL 1000-0.7 MG/100ML-% IV SOLN
1000.0000 mg | Freq: Once | INTRAVENOUS | Status: AC
  Administered 2022-05-16: 1000 mg via INTRAVENOUS
  Filled 2022-05-16: qty 100

## 2022-05-16 MED ORDER — DEXAMETHASONE SODIUM PHOSPHATE 10 MG/ML IJ SOLN
INTRAMUSCULAR | Status: AC
  Filled 2022-05-16: qty 1

## 2022-05-16 MED ORDER — FENTANYL CITRATE (PF) 100 MCG/2ML IJ SOLN
INTRAMUSCULAR | Status: DC | PRN
  Administered 2022-05-16 (×5): 50 ug via INTRAVENOUS
  Administered 2022-05-16: 100 ug via INTRAVENOUS
  Administered 2022-05-16 (×3): 50 ug via INTRAVENOUS

## 2022-05-16 MED ORDER — METOCLOPRAMIDE HCL 5 MG/ML IJ SOLN: 5.0000 mg | Freq: Three times a day (TID) | INTRAMUSCULAR | Status: DC | PRN

## 2022-05-16 MED ORDER — ONDANSETRON HCL 4 MG/2ML IJ SOLN: 4.0000 mg | Freq: Once | INTRAMUSCULAR | Status: DC | PRN

## 2022-05-16 MED ORDER — SODIUM CHLORIDE 0.9 % IR SOLN
Status: DC | PRN
  Administered 2022-05-16: 3000 mL

## 2022-05-16 MED ORDER — LOSARTAN POTASSIUM 50 MG PO TABS
50.0000 mg | ORAL_TABLET | Freq: Every day | ORAL | Status: DC
Start: 2022-05-16 — End: 2022-05-17
  Administered 2022-05-16 – 2022-05-17 (×2): 50 mg via ORAL
  Filled 2022-05-16 (×2): qty 1

## 2022-05-16 MED ORDER — ONDANSETRON HCL 4 MG/2ML IJ SOLN
INTRAMUSCULAR | Status: AC
  Filled 2022-05-16: qty 2

## 2022-05-16 MED ORDER — ONDANSETRON HCL 4 MG/2ML IJ SOLN
INTRAMUSCULAR | Status: DC | PRN
  Administered 2022-05-16: 4 mg via INTRAVENOUS

## 2022-05-16 MED ORDER — ONDANSETRON HCL 4 MG/2ML IJ SOLN
4.0000 mg | Freq: Once | INTRAMUSCULAR | Status: AC
  Administered 2022-05-16: 4 mg via INTRAVENOUS
  Filled 2022-05-16: qty 2

## 2022-05-16 MED ORDER — OXYCODONE HCL 5 MG PO TABS
5.0000 mg | ORAL_TABLET | Freq: Once | ORAL | Status: AC
  Administered 2022-05-16: 5 mg via ORAL
  Filled 2022-05-16: qty 1

## 2022-05-16 MED ORDER — FENTANYL CITRATE (PF) 250 MCG/5ML IJ SOLN
INTRAMUSCULAR | Status: AC
  Filled 2022-05-16: qty 5

## 2022-05-16 MED ORDER — CEFAZOLIN SODIUM-DEXTROSE 2-4 GM/100ML-% IV SOLN
2.0000 g | Freq: Four times a day (QID) | INTRAVENOUS | Status: AC
  Administered 2022-05-16 (×2): 2 g via INTRAVENOUS
  Filled 2022-05-16 (×2): qty 100

## 2022-05-16 MED ORDER — PREGABALIN 50 MG PO CAPS
50.0000 mg | ORAL_CAPSULE | Freq: Once | ORAL | Status: AC
  Administered 2022-05-16: 50 mg via ORAL
  Filled 2022-05-16: qty 1

## 2022-05-16 MED ORDER — PROPOFOL 10 MG/ML IV BOLUS
INTRAVENOUS | Status: AC
  Filled 2022-05-16: qty 20

## 2022-05-16 MED ORDER — GLYCOPYRROLATE 0.2 MG/ML IJ SOLN
INTRAMUSCULAR | Status: DC | PRN
  Administered 2022-05-16: .2 mg via INTRAVENOUS

## 2022-05-16 MED ORDER — LIDOCAINE HCL (CARDIAC) PF 100 MG/5ML IV SOSY
PREFILLED_SYRINGE | INTRAVENOUS | Status: DC | PRN
  Administered 2022-05-16: 80 mg via INTRAVENOUS

## 2022-05-16 MED ORDER — HYDROCODONE-ACETAMINOPHEN 7.5-325 MG PO TABS: 1.0000 | ORAL_TABLET | Freq: Once | ORAL | Status: DC | PRN

## 2022-05-16 MED ORDER — TRANEXAMIC ACID-NACL 1000-0.7 MG/100ML-% IV SOLN
1000.0000 mg | INTRAVENOUS | Status: AC
  Administered 2022-05-16: 1000 mg via INTRAVENOUS
  Filled 2022-05-16: qty 100

## 2022-05-16 MED ORDER — 0.9 % SODIUM CHLORIDE (POUR BTL) OPTIME
TOPICAL | Status: DC | PRN
  Administered 2022-05-16: 1000 mL

## 2022-05-16 MED ORDER — CELECOXIB 100 MG PO CAPS
200.0000 mg | ORAL_CAPSULE | Freq: Two times a day (BID) | ORAL | Status: DC
  Administered 2022-05-16: 200 mg via ORAL
  Filled 2022-05-16: qty 2

## 2022-05-16 MED ORDER — DOCUSATE SODIUM 100 MG PO CAPS
100.0000 mg | ORAL_CAPSULE | Freq: Two times a day (BID) | ORAL | Status: DC
  Administered 2022-05-16 – 2022-05-17 (×2): 100 mg via ORAL
  Filled 2022-05-16 (×2): qty 1

## 2022-05-16 MED ORDER — BISACODYL 5 MG PO TBEC: 5.0000 mg | DELAYED_RELEASE_TABLET | Freq: Every day | ORAL | Status: DC | PRN

## 2022-05-16 MED ORDER — ATORVASTATIN CALCIUM 20 MG PO TABS
20.0000 mg | ORAL_TABLET | Freq: Every day | ORAL | Status: DC
  Administered 2022-05-16 – 2022-05-17 (×2): 20 mg via ORAL
  Filled 2022-05-16 (×2): qty 1

## 2022-05-16 MED ORDER — STERILE WATER FOR IRRIGATION IR SOLN
Status: DC | PRN
  Administered 2022-05-16: 1000 mL

## 2022-05-16 MED ORDER — METHOCARBAMOL 1000 MG/10ML IJ SOLN: 500.0000 mg | Freq: Four times a day (QID) | INTRAVENOUS | Status: DC | PRN

## 2022-05-16 MED ORDER — POVIDONE-IODINE 10 % EX SWAB: 2.0000 | Freq: Once | CUTANEOUS | Status: DC

## 2022-05-16 MED ORDER — PHENYLEPHRINE 80 MCG/ML (10ML) SYRINGE FOR IV PUSH (FOR BLOOD PRESSURE SUPPORT)
PREFILLED_SYRINGE | INTRAVENOUS | Status: AC
  Filled 2022-05-16: qty 30

## 2022-05-16 MED ORDER — ASPIRIN 325 MG PO TBEC: 325.0000 mg | DELAYED_RELEASE_TABLET | Freq: Every day | ORAL | Status: DC

## 2022-05-16 MED ORDER — KETAMINE HCL 10 MG/ML IJ SOLN
INTRAMUSCULAR | Status: DC | PRN
  Administered 2022-05-16: 30 mg via INTRAVENOUS
  Administered 2022-05-16: 10 mg via INTRAVENOUS

## 2022-05-16 MED ORDER — PHENYLEPHRINE 80 MCG/ML (10ML) SYRINGE FOR IV PUSH (FOR BLOOD PRESSURE SUPPORT)
PREFILLED_SYRINGE | INTRAVENOUS | Status: AC
  Filled 2022-05-16: qty 10

## 2022-05-16 MED ORDER — AMLODIPINE BESYLATE 5 MG PO TABS
5.0000 mg | ORAL_TABLET | Freq: Every day | ORAL | Status: DC
  Administered 2022-05-17: 5 mg via ORAL
  Filled 2022-05-16: qty 1

## 2022-05-16 MED ORDER — EPHEDRINE 5 MG/ML INJ
INTRAVENOUS | Status: AC
  Filled 2022-05-16: qty 5

## 2022-05-16 MED ORDER — CEFAZOLIN SODIUM-DEXTROSE 2-4 GM/100ML-% IV SOLN: 2.0000 g | INTRAVENOUS | Status: DC

## 2022-05-16 MED ORDER — BUPIVACAINE-MELOXICAM ER 400-12 MG/14ML IJ SOLN
INTRAMUSCULAR | Status: DC | PRN
  Administered 2022-05-16: 400 mg

## 2022-05-16 MED ORDER — CEFAZOLIN SODIUM-DEXTROSE 2-4 GM/100ML-% IV SOLN
2.0000 g | INTRAVENOUS | Status: AC
  Administered 2022-05-16: 2 g via INTRAVENOUS
  Filled 2022-05-16: qty 100

## 2022-05-16 MED ORDER — PROPOFOL 10 MG/ML IV BOLUS
INTRAVENOUS | Status: DC | PRN
  Administered 2022-05-16: 150 mg via INTRAVENOUS

## 2022-05-16 MED ORDER — FAMOTIDINE 20 MG PO TABS
20.0000 mg | ORAL_TABLET | Freq: Two times a day (BID) | ORAL | Status: DC
  Administered 2022-05-16 – 2022-05-17 (×2): 20 mg via ORAL
  Filled 2022-05-16 (×2): qty 1

## 2022-05-16 MED ORDER — MIDAZOLAM HCL 5 MG/5ML IJ SOLN
INTRAMUSCULAR | Status: DC | PRN
  Administered 2022-05-16: 2 mg via INTRAVENOUS

## 2022-05-16 MED ORDER — METHOCARBAMOL 1000 MG/10ML IJ SOLN
500.0000 mg | Freq: Once | INTRAVENOUS | Status: AC
  Administered 2022-05-16: 500 mg via INTRAVENOUS
  Filled 2022-05-16: qty 5

## 2022-05-16 MED ORDER — METHOCARBAMOL 500 MG PO TABS: 500.0000 mg | ORAL_TABLET | Freq: Four times a day (QID) | ORAL | Status: DC | PRN

## 2022-05-16 MED ORDER — KETAMINE HCL 50 MG/5ML IJ SOSY
PREFILLED_SYRINGE | INTRAMUSCULAR | Status: AC
  Filled 2022-05-16: qty 5

## 2022-05-16 MED ORDER — GABAPENTIN 300 MG PO CAPS
300.0000 mg | ORAL_CAPSULE | Freq: Two times a day (BID) | ORAL | Status: DC
  Administered 2022-05-16 – 2022-05-17 (×2): 300 mg via ORAL
  Filled 2022-05-16 (×2): qty 1

## 2022-05-16 MED ORDER — OXYCODONE HCL 5 MG PO TABS: 10.0000 mg | ORAL_TABLET | ORAL | Status: DC | PRN

## 2022-05-16 MED ORDER — ACETAMINOPHEN 10 MG/ML IV SOLN
INTRAVENOUS | Status: DC | PRN
  Administered 2022-05-16: 1000 mg via INTRAVENOUS

## 2022-05-16 MED ORDER — HYDROMORPHONE HCL 1 MG/ML IJ SOLN: 0.2500 mg | INTRAMUSCULAR | Status: DC | PRN

## 2022-05-16 MED ORDER — LACTATED RINGERS IV SOLN: INTRAVENOUS | Status: DC | PRN

## 2022-05-16 MED ORDER — BUPIVACAINE-MELOXICAM ER 200-6 MG/7ML IJ SOLN
INTRAMUSCULAR | Status: AC
  Filled 2022-05-16: qty 1

## 2022-05-16 MED ORDER — TRAMADOL HCL 50 MG PO TABS
50.0000 mg | ORAL_TABLET | Freq: Four times a day (QID) | ORAL | Status: DC
  Administered 2022-05-16 – 2022-05-17 (×4): 50 mg via ORAL
  Filled 2022-05-16 (×4): qty 1

## 2022-05-16 MED ORDER — MIDAZOLAM HCL 2 MG/2ML IJ SOLN
INTRAMUSCULAR | Status: AC
  Filled 2022-05-16: qty 2

## 2022-05-16 MED ORDER — ONDANSETRON HCL 4 MG/2ML IJ SOLN: 4.0000 mg | Freq: Four times a day (QID) | INTRAMUSCULAR | Status: DC | PRN

## 2022-05-16 MED ORDER — NAPROXEN 250 MG PO TABS
500.0000 mg | ORAL_TABLET | Freq: Two times a day (BID) | ORAL | Status: DC
  Administered 2022-05-16: 500 mg via ORAL
  Filled 2022-05-16: qty 2

## 2022-05-16 MED ORDER — PANTOPRAZOLE SODIUM 40 MG PO TBEC
40.0000 mg | DELAYED_RELEASE_TABLET | Freq: Every day | ORAL | Status: DC
  Administered 2022-05-17: 40 mg via ORAL
  Filled 2022-05-16: qty 1

## 2022-05-16 MED ORDER — METOCLOPRAMIDE HCL 10 MG PO TABS: 5.0000 mg | ORAL_TABLET | Freq: Three times a day (TID) | ORAL | Status: DC | PRN

## 2022-05-16 MED ORDER — EPHEDRINE SULFATE (PRESSORS) 50 MG/ML IJ SOLN
INTRAMUSCULAR | Status: DC | PRN
  Administered 2022-05-16 (×5): 5 mg via INTRAVENOUS

## 2022-05-16 MED ORDER — POLYETHYLENE GLYCOL 3350 17 G PO PACK: 17.0000 g | PACK | Freq: Every day | ORAL | Status: DC | PRN

## 2022-05-16 MED ORDER — DEXAMETHASONE SODIUM PHOSPHATE 10 MG/ML IJ SOLN
10.0000 mg | Freq: Once | INTRAMUSCULAR | Status: AC
  Administered 2022-05-17: 10 mg via INTRAVENOUS
  Filled 2022-05-16: qty 1

## 2022-05-16 MED ORDER — GLYCOPYRROLATE PF 0.2 MG/ML IJ SOSY
PREFILLED_SYRINGE | INTRAMUSCULAR | Status: AC
  Filled 2022-05-16: qty 1

## 2022-05-16 SURGICAL SUPPLY — 66 items
ACETAB CUP W/GRIPTION 54 (Plate) ×2 IMPLANT
BIT DRILL 2.8X128 (BIT) ×2 IMPLANT
BLADE HEX COATED 2.75 (ELECTRODE) ×3 IMPLANT
BLADE SAGITTAL 25.0X1.27X90 (BLADE) ×2 IMPLANT
CLOTH BEACON ORANGE TIMEOUT ST (SAFETY) ×3 IMPLANT
COVER LIGHT HANDLE STERIS (MISCELLANEOUS) ×4 IMPLANT
CUP ACETAB W/GRIPTION 54 (Plate) IMPLANT
DRAPE BACK TABLE (DRAPES) ×2 IMPLANT
DRAPE HALF SHEET 40X57 (DRAPES) ×1 IMPLANT
DRAPE HIP W/POCKET STRL (MISCELLANEOUS) ×2 IMPLANT
DRSG MEPILEX BORDER 4X12 (GAUZE/BANDAGES/DRESSINGS) ×3 IMPLANT
DRSG MEPILEX SACRM 8.7X9.8 (GAUZE/BANDAGES/DRESSINGS) ×2 IMPLANT
DURAPREP 26ML APPLICATOR (WOUND CARE) ×4 IMPLANT
ELECT REM PT RETURN 9FT ADLT (ELECTROSURGICAL) ×2
ELECTRODE REM PT RTRN 9FT ADLT (ELECTROSURGICAL) ×1 IMPLANT
ELIMINATOR HOLE APEX DEPUY (Hips) ×1 IMPLANT
GLOVE BIO SURGEON STRL SZ7 (GLOVE) ×4 IMPLANT
GLOVE BIOGEL PI IND STRL 7.0 (GLOVE) ×4 IMPLANT
GLOVE BIOGEL PI IND STRL 8.5 (GLOVE) IMPLANT
GLOVE BIOGEL PI INDICATOR 7.0 (GLOVE) ×4
GLOVE BIOGEL PI INDICATOR 8.5 (GLOVE) ×1
GLOVE ECLIPSE 6.5 STRL STRAW (GLOVE) ×2 IMPLANT
GLOVE ECLIPSE 7.0 STRL STRAW (GLOVE) ×1 IMPLANT
GLOVE INDICATOR 7.0 STRL GRN (GLOVE) ×1 IMPLANT
GLOVE SKINSENSE NS SZ8.0 LF (GLOVE) ×3
GLOVE SKINSENSE STRL SZ8.0 LF (GLOVE) IMPLANT
GOWN STRL REUS W/TWL LRG LVL3 (GOWN DISPOSABLE) ×7 IMPLANT
GOWN STRL REUS W/TWL XL LVL3 (GOWN DISPOSABLE) ×3 IMPLANT
HANDPIECE INTERPULSE COAX TIP (DISPOSABLE) ×2
HEAD CERAMIC DELTA 36 PLUS 1.5 (Hips) ×1 IMPLANT
HOOD W/PEELAWAY (MISCELLANEOUS) ×11 IMPLANT
INST SET MAJOR BONE (KITS) ×2 IMPLANT
IV NS IRRIG 3000ML ARTHROMATIC (IV SOLUTION) ×2 IMPLANT
KIT BLADEGUARD II DBL (SET/KITS/TRAYS/PACK) ×3 IMPLANT
KIT TURNOVER KIT A (KITS) ×3 IMPLANT
LINER NEUTRAL 54X36MM PLUS 4 (Hips) ×1 IMPLANT
MANIFOLD NEPTUNE II (INSTRUMENTS) ×3 IMPLANT
MARKER SKIN DUAL TIP RULER LAB (MISCELLANEOUS) ×3 IMPLANT
NDL MAYO 6 CRC TAPER PT (NEEDLE) IMPLANT
NEEDLE MAYO 6 CRC TAPER PT (NEEDLE) ×2 IMPLANT
NS IRRIG 1000ML POUR BTL (IV SOLUTION) ×3 IMPLANT
PACK TOTAL JOINT (CUSTOM PROCEDURE TRAY) ×2 IMPLANT
PAD ARMBOARD 7.5X6 YLW CONV (MISCELLANEOUS) ×3 IMPLANT
PASSER SUT SWANSON 36MM LOOP (INSTRUMENTS) ×1 IMPLANT
PENCIL HANDSWITCHING (ELECTRODE) ×1 IMPLANT
PIN STMN SNGL STERILE 9X3.6MM (PIN) ×3 IMPLANT
SET BASIN LINEN APH (SET/KITS/TRAYS/PACK) ×2 IMPLANT
SET HNDPC FAN SPRY TIP SCT (DISPOSABLE) ×1 IMPLANT
SPONGE T-LAP 18X18 ~~LOC~~+RFID (SPONGE) ×7 IMPLANT
STAPLER VISISTAT 35W (STAPLE) ×2 IMPLANT
STEM TRI LOC BPS SZ6 W GRIPTON IMPLANT
STRIP CLOSURE SKIN 1/2X4 (GAUZE/BANDAGES/DRESSINGS) ×1 IMPLANT
SUT BRALON NAB BRD #1 30IN (SUTURE) ×4 IMPLANT
SUT ETHIBOND 5 LR DA (SUTURE) ×2 IMPLANT
SUT MNCRL 0 VIOLET CTX 36 (SUTURE) ×2 IMPLANT
SUT MON AB 0 CT1 (SUTURE) ×1 IMPLANT
SUT MON AB 2-0 CT1 36 (SUTURE) ×1 IMPLANT
SUT MONOCRYL 0 CTX 36 (SUTURE) ×2
SUT VIC AB 1 CT1 27 (SUTURE) ×8
SUT VIC AB 1 CT1 27XBRD ANTBC (SUTURE) ×4 IMPLANT
SYR BULB IRRIG 60ML STRL (SYRINGE) ×2 IMPLANT
TOWEL OR 17X26 4PK STRL BLUE (TOWEL DISPOSABLE) ×3 IMPLANT
TRAY FOLEY MTR SLVR 16FR STAT (SET/KITS/TRAYS/PACK) ×2 IMPLANT
TRI LOC BPS SZ 6 W GRIPTON ×2 IMPLANT
WATER STERILE IRR 1000ML POUR (IV SOLUTION) ×1 IMPLANT
YANKAUER SUCT 12FT TUBE ARGYLE (SUCTIONS) ×3 IMPLANT

## 2022-05-16 NOTE — Brief Op Note (Signed)
05/16/2022  1:59 PM  PATIENT:  David Knox  56 y.o. male  PRE-OPERATIVE DIAGNOSIS:  other secondary osteoarthritis of right hip  POST-OPERATIVE DIAGNOSIS:  other secondary osteoarthritis of right hip  PROCEDURE:  Procedure(s) with comments: TOTAL HIP ARTHROPLASTY (Right) - zynrelef 400mg  once  SURGEON:  Surgeon(s) and Role:    , MD - Primary  Findings at surgery  Grade 4 osteoarthritis of the right hip mild shortening.  Extensive scarring around the hip joint and capsule with osteophytes around the head and acetabulum.  Previous surgery because extensive scarring along with the secondary degenerative changes secondary to slipped capital femoral epiphysis according to the patient.  PHYSICIAN ASSISTANT:   ASSISTANTS: Vickki Hearing and Marylene Land   ANESTHESIA:   general  EBL:  350 mL   BLOOD ADMINISTERED:none  DRAINS: none   LOCAL MEDICATIONS USED:  OTHER Zen relief  SPECIMEN:  No Specimen  DISPOSITION OF SPECIMEN:  N/A  COUNTS:  YES  TOURNIQUET:  * No tourniquets in log *  DICTATION: .Dragon Dictation  PLAN OF CARE: Admit for overnight observation  PATIENT DISPOSITION:  PACU - hemodynamically stable.   Delay start of Pharmacological VTE agent (>24hrs) due to surgical blood loss or risk of bleeding: not applicable

## 2022-05-16 NOTE — Op Note (Signed)
05/16/2022  1:59 PM  PATIENT:  David Knox  56 y.o. male  PRE-OPERATIVE DIAGNOSIS:  other secondary osteoarthritis of right hip  POST-OPERATIVE DIAGNOSIS:  other secondary osteoarthritis of right hip  PROCEDURE:  Procedure(s) with comments: TOTAL HIP ARTHROPLASTY (Right) - zynrelef 400mg  once  Implants 54 Pinnacle cup DePuy  6 press-fit stem DePuy  +1.5 ceramic head  Direct lateral approach  SURGEON:  Surgeon(s) and Role:    , MD - Primary  Findings at surgery  Grade 4 osteoarthritis of the right hip mild shortening.  Extensive scarring around the hip joint and capsule with osteophytes around the head and acetabulum.  Previous surgery because extensive scarring along with the secondary degenerative changes secondary to slipped capital femoral epiphysis according to the patient.  Details of procedure after preop identification and site confirmation of right hip and marking patient was brought to the operating room images were reviewed.  A Foley catheter was placed after general anesthesia.  The patient was then placed in the modified lateral position with axillary roll and we used the hip holder to hold him in the lateral position we padded his lower extremities.  After sterile prep and drape and timeout the procedure was begun.  A direct lateral incision was made and adjusted based on the patient's previous incision.  This incision was slightly anterior and was incorporated into our normal incision  After initial skin incision subcutaneous tissue dissection his incision had to be extended proximally and distally due to scarring  Fascia was identified split in line with skin incision and deep retractor was placed there was really no bursal tissue but there was significant adhesions between the fascia and the gluteus medius musculature which was thick and abundant  The gluteus medius was split bluntly carried down to the trochanter the leg was externally  rotated and subperiosteal dissection was performed removing the gluteus medius and minimus leaving the capsule behind.  The capsule was then split and excised into Steinmann pins were placed into the pelvis  Standard provisional neck cut was made using a neck cutting guide.  Head was removed.  The head was poor shape and deformed with multiple osteophytes the acetabulum had multiple osteophytes and scar tissue as well there was significant bleeding as the scar tissue was excised  It took a long time to excise the tissue around the acetabulum.  Once this was accomplished the leg was placed in in the flexed position with the hip dislocated anteriorly and standard proximal femur prep was performed per manufacture technique  The broaching performed up to size 6.  Vickki Hearing was removed.  Leg was placed in extension further dissection was performed to find the transverse acetabular ligament a retractor was placed deep to it followed by anterior and posterior acetabular retractors.  The acetabulum was then reamed starting with 44 up to a 54.  I tested a 53 reamer followed by 54 cup and realized that we needed a line to line construct.  The cup was placed in anatomic anteversion and 45 degrees of abduction.  We then trialed with a 6 stem +5, 36 head with a +4 neutral liner.  This was not reducible and we went down to a +1.5 neck length.  This reduced nicely the hip was stable in all planes  We then placed the implant for the polyethylene which was a +4 neutral liner for 36 head.  We placed drill holes in the proximal femur we passed suture #5 Ethibond we  placed the stem and then repaired the abductors back to their anatomic position using the #5 Ethibond #1 Braylon and #1 Vicryl suture.  We then abducted the leg closed the fascia with #1 Braylon.  We injected Zen relief under the abductors and the fascial layer.  We closed the skin in 2 layers with 0 Monocryl suture.  We used staples to close the skin and  then placed a sterile dressing  The patient was extubated taken recovery in stable condition  He can be weight-bear as tolerated with a walker no active abduction exercises or external rotation  PHYSICIAN ASSISTANT:   ASSISTANTS: Marylene Land and Lamarr Lulas   ANESTHESIA:   general  EBL:  350 mL   BLOOD ADMINISTERED:none  DRAINS: none   LOCAL MEDICATIONS USED:  OTHER Zen relief  SPECIMEN:  No Specimen  DISPOSITION OF SPECIMEN:  N/A  COUNTS:  YES  TOURNIQUET:  * No tourniquets in log *  DICTATION: .Dragon Dictation  PLAN OF CARE: Admit for overnight observation  PATIENT DISPOSITION:  PACU - hemodynamically stable.   Delay start of Pharmacological VTE agent (>24hrs) due to surgical blood loss or risk of bleeding: not applicable

## 2022-05-16 NOTE — Progress Notes (Signed)
Incentive instruction done 

## 2022-05-16 NOTE — Anesthesia Procedure Notes (Signed)
Procedure Name: Intubation Date/Time: 05/16/2022 11:15 AM  Performed by: Trixie Rude, MDPre-anesthesia Checklist: Patient identified, Emergency Drugs available, Suction available and Patient being monitored Patient Re-evaluated:Patient Re-evaluated prior to induction Oxygen Delivery Method: Circle system utilized Preoxygenation: Pre-oxygenation with 100% oxygen Induction Type: IV induction Ventilation: Mask ventilation without difficulty Laryngoscope Size: Mac and 4 Grade View: Grade I Tube type: Oral Tube size: 7.0 mm Number of attempts: 1 Airway Equipment and Method: Stylet and Oral airway Placement Confirmation: ETT inserted through vocal cords under direct vision, positive ETCO2 and breath sounds checked- equal and bilateral Secured at: 23 cm Tube secured with: Tape Dental Injury: Teeth and Oropharynx as per pre-operative assessment

## 2022-05-16 NOTE — Plan of Care (Signed)
  Problem: Acute Rehab PT Goals(only PT should resolve) Goal: Pt Will Go Supine/Side To Sit Outcome: Progressing Flowsheets (Taken 05/16/2022 1612) Pt will go Supine/Side to Sit: Independently Goal: Patient Will Transfer Sit To/From Stand Outcome: Progressing Flowsheets (Taken 05/16/2022 1612) Patient will transfer sit to/from stand:  Independently  with modified independence Goal: Pt Will Transfer Bed To Chair/Chair To Bed Outcome: Progressing Flowsheets (Taken 05/16/2022 1612) Pt will Transfer Bed to Chair/Chair to Bed:  Independently  with modified independence Goal: Pt Will Perform Standing Balance Or Pre-Gait Outcome: Progressing Flowsheets (Taken 05/16/2022 1612) Pt will perform standing balance or pre-gait:  Independently  with Modified Independent  3- 5 min  with bilateral UE support Goal: Pt Will Ambulate Outcome: Progressing Flowsheets (Taken 05/16/2022 1612) Pt will Ambulate:  > 125 feet  with min guard assist  with rolling walker   4:13 PM, 05/16/22 Arther Abbott, S/PT

## 2022-05-16 NOTE — Interval H&P Note (Signed)
History and Physical Interval Note:  05/16/2022 10:30 AM  David Knox  has presented today for surgery, with the diagnosis of other secondary osteoarthritis of right hip.  The various methods of treatment have been discussed with the patient and family. After consideration of risks, benefits and other options for treatment, the patient has consented to  Procedure(s) with comments: TOTAL HIP ARTHROPLASTY (Right) - zynrelef 400mg  once as a surgical intervention.  The patient's history has been reviewed, patient examined, no change in status, stable for surgery.  I have reviewed the patient's chart and labs.  Questions were answered to the patient's satisfaction.     

## 2022-05-16 NOTE — Anesthesia Preprocedure Evaluation (Signed)
Anesthesia Evaluation  Patient identified by MRN, date of birth, ID band Patient awake    Reviewed: Allergy & Precautions, NPO status , Patient's Chart, lab work & pertinent test results  Airway Mallampati: II  TM Distance: >3 FB Neck ROM: Full    Dental no notable dental hx.    Pulmonary neg pulmonary ROS, former smoker,    Pulmonary exam normal        Cardiovascular Exercise Tolerance: Good hypertension, Normal cardiovascular exam     Neuro/Psych  Neuromuscular disease (polyneuropathy) negative psych ROS   GI/Hepatic Neg liver ROS, GERD  ,  Endo/Other  Hyperthyroidism   Renal/GU negative Renal ROS  negative genitourinary   Musculoskeletal  (+) Arthritis ,   Abdominal Normal abdominal exam  (+)   Peds  Hematology negative hematology ROS (+)   Anesthesia Other Findings   Reproductive/Obstetrics                             Anesthesia Physical Anesthesia Plan  ASA: 2  Anesthesia Plan: General   Post-op Pain Management: Ofirmev IV (intra-op)*, Toradol IV (intra-op)* and Dilaudid IV   Induction: Intravenous  PONV Risk Score and Plan: 1 and Ondansetron and Dexamethasone  Airway Management Planned: Oral ETT  Additional Equipment: None  Intra-op Plan:   Post-operative Plan: Extubation in OR  Informed Consent: I have reviewed the patients History and Physical, chart, labs and discussed the procedure including the risks, benefits and alternatives for the proposed anesthesia with the patient or authorized representative who has indicated his/her understanding and acceptance.       Plan Discussed with:   Anesthesia Plan Comments:         Anesthesia Quick Evaluation

## 2022-05-16 NOTE — Evaluation (Signed)
Physical Therapy Evaluation Patient Details Name: David Knox MRN: [id number] DOB: 12-14-1965 Today's Date: 05/16/2022  History of Present Illness  David Knox  has presented today for surgery, with the diagnosis of other secondary osteoarthritis of right hip.  The various methods of treatment have been discussed with the patient and family. After consideration of risks, benefits and other options for treatment, the patient has consented to  Procedure(s) with comments:  TOTAL HIP ARTHROPLASTY (Right)   Clinical Impression  Patient presents supine in bed with nursing staff in room. Patient consents to PT evaluation. Patient is modified independent with bed mobility. Requires extended time along with demonstrating slow mild labored movement secondary to discomfort post-op R THA. Good trunk control with ability to sit up and scoot to EOB. Patient is independent with transfers and use of RW. Able to perform transfer without RW but very unsteady and reaches for objects to improve stability. Improved balance and coordination with RW. Good demonstration of technical form with performing sit to stand. Patient tolerated therapeutic exercises to initiate hip ROM and general LE strengthening. Patient was able to ambulate 150 feet with RW in hallway with min guard. Initial step-to pattern but patient able to normalize gait pattern with increased distance during ambulation. Decreased gait speed with minor gait deviations. Patient tolerated sitting up in chair after therapy with nursing staff notified of mobility status. Patient will benefit from continued skilled physical therapy in hospital and recommended venue below to increase strength, balance, endurance for safe ADLs and gait.      Recommendations for follow up therapy are one component of a multi-disciplinary discharge planning process, led by the attending physician.  Recommendations may be updated based on patient status, additional functional  criteria and insurance authorization.  Follow Up Recommendations Home health PT      Assistance Recommended at Discharge PRN  Patient can return home with the following  A little help with walking and/or transfers;Help with stairs or ramp for entrance    Equipment Recommendations None recommended by PT  Recommendations for Other Services       Functional Status Assessment Patient has had a recent decline in their functional status and demonstrates the ability to make significant improvements in function in a reasonable and predictable amount of time.     Precautions / Restrictions Precautions Precautions: Fall Restrictions Weight Bearing Restrictions: Yes RLE Weight Bearing: Weight bearing as tolerated      Mobility  Bed Mobility Overal bed mobility: Modified Independent             General bed mobility comments: Modified independent with supine to sit. Extended time need along with mild labored movement secondary to disomcort post-op R THA. Good trunk control with ability to sit up and scoot to EOB    Transfers Overall transfer level: Independent Equipment used: None, Rolling walker (2 wheels)               General transfer comment: Indepenent with transfers with transfers. Able to do without RW but unstable and reaches out for support. Improved balance and stability with RW. Good coordination and techincal form    Ambulation/Gait Ambulation/Gait assistance: Min guard Gait Distance (Feet): 150 Feet Assistive device: Rolling walker (2 wheels) Gait Pattern/deviations: Step-to pattern, Decreased step length - left, Decreased step length - right, Decreased stride length, Narrow base of support Gait velocity: decreased     General Gait Details: Able to ambulate 150 feet with RW in hallway with min guard. Initial step-to  pattern but patient able to normalize gait pattern with increased distance. Decreased gait speed with minor gait deviations.  Stairs             Wheelchair Mobility    Modified Rankin (Stroke Patients Only)       Balance Overall balance assessment: Mild deficits observed, not formally tested                                           Pertinent Vitals/Pain Pain Assessment Pain Assessment: No/denies pain    Home Living Family/patient expects to be discharged to:: Private residence Living Arrangements: Spouse/significant other Available Help at Discharge: Family;Available PRN/intermittently Type of Home: Mobile home Home Access: Level entry       Home Layout: One level Home Equipment: Agricultural consultant (2 wheels)      Prior Function Prior Level of Function : Independent/Modified Independent             Mobility Comments: Patient reports ambulating at home and in the community independently with no AD. Not currently driving. ADLs Comments: Patient reports being able to perform ADL's independently.     Hand Dominance        Extremity/Trunk Assessment   Upper Extremity Assessment Upper Extremity Assessment: Overall WFL for tasks assessed    Lower Extremity Assessment Lower Extremity Assessment: Overall WFL for tasks assessed    Cervical / Trunk Assessment Cervical / Trunk Assessment: Normal  Communication   Communication: No difficulties  Cognition Arousal/Alertness: Awake/alert Behavior During Therapy: WFL for tasks assessed/performed Overall Cognitive Status: Within Functional Limits for tasks assessed                                          General Comments      Exercises General Exercises - Lower Extremity Ankle Circles/Pumps: AROM, 15 reps, Supine, Strengthening, Right Quad Sets: AROM, 10 reps, Strengthening, Right, Supine Short Arc Quad: AROM, Strengthening, Supine, Right, 10 reps Heel Slides: AROM, 10 reps, Strengthening, Supine, Right   Assessment/Plan    PT Assessment Patient needs continued PT services  PT Problem List Decreased  strength;Decreased activity tolerance;Decreased balance;Decreased mobility;Decreased coordination;Decreased range of motion       PT Treatment Interventions DME instruction;Gait training;Functional mobility training;Therapeutic activities;Therapeutic exercise;Balance training    PT Goals (Current goals can be found in the Care Plan section)  Acute Rehab PT Goals Patient Stated Goal: return home PT Goal Formulation: With patient Time For Goal Achievement: 05/18/22 Potential to Achieve Goals: Good    Frequency 7X/week     Co-evaluation               AM-PAC PT "6 Clicks" Mobility  Outcome Measure Help needed turning from your back to your side while in a flat bed without using bedrails?: None Help needed moving from lying on your back to sitting on the side of a flat bed without using bedrails?: A Little Help needed moving to and from a bed to a chair (including a wheelchair)?: A Little Help needed standing up from a chair using your arms (e.g., wheelchair or bedside chair)?: A Little Help needed to walk in hospital room?: A Little Help needed climbing 3-5 steps with a railing? : A Little 6 Click Score: 19    End of Session  Activity Tolerance: Patient tolerated treatment well;No increased pain Patient left: in chair;with call bell/phone within reach Nurse Communication: Mobility status PT Visit Diagnosis: Unsteadiness on feet (R26.81);Other abnormalities of gait and mobility (R26.89);Muscle weakness (generalized) (M62.81)    Time: 4037-5436 PT Time Calculation (min) (ACUTE ONLY): 30 min   Charges:   PT Evaluation $PT Eval Moderate Complexity: 1 Mod PT Treatments $Therapeutic Activity: 23-37 mins        4:12 PM, 05/16/22 Arther Abbott, S/PT

## 2022-05-16 NOTE — Anesthesia Postprocedure Evaluation (Signed)
Anesthesia Post Note  Patient: David Knox  Procedure(s) Performed: TOTAL HIP ARTHROPLASTY (Right: Hip)  Patient location during evaluation: PACU Anesthesia Type: General Level of consciousness: awake and alert Pain management: pain level controlled Vital Signs Assessment: post-procedure vital signs reviewed and stable Respiratory status: spontaneous breathing, nonlabored ventilation, respiratory function stable and patient connected to nasal cannula oxygen Cardiovascular status: blood pressure returned to baseline and stable Postop Assessment: no apparent nausea or vomiting Anesthetic complications: no   There were no known notable events for this encounter.   Last Vitals:  Vitals:   05/16/22 1500 05/16/22 1532  BP: (!) 124/91 130/90  Pulse: 96 (!) 106  Resp: 12 14  Temp:  36.5 C  SpO2: 94% 97%    Last Pain:  Vitals:   05/16/22 1532  TempSrc: Oral  PainSc: 4                  Glynis Smiles

## 2022-05-16 NOTE — Transfer of Care (Signed)
Immediate Anesthesia Transfer of Care Note  Patient: David Knox  Procedure(s) Performed: TOTAL HIP ARTHROPLASTY (Right: Hip)  Patient Location: PACU  Anesthesia Type:General  Level of Consciousness: drowsy  Airway & Oxygen Therapy: Patient Spontanous Breathing and Patient connected to nasal cannula oxygen  Post-op Assessment: Report given to RN and Post -op Vital signs reviewed and stable  Post vital signs: Reviewed and stable  Last Vitals:  Vitals Value Taken Time  BP 125/79 05/16/22 1358  Temp    Pulse    Resp 19 05/16/22 1359  SpO2    Vitals shown include unvalidated device data.  Last Pain:  Vitals:   05/16/22 0851  TempSrc: Oral  PainSc: 0-No pain      Patients Stated Pain Goal: 9 (05/16/22 0851)  Complications: No notable events documented.

## 2022-05-16 NOTE — Interval H&P Note (Signed)
History and Physical Interval Note:  05/16/2022 10:30 AM  David Knox  has presented today for surgery, with the diagnosis of other secondary osteoarthritis of right hip.  The various methods of treatment have been discussed with the patient and family. After consideration of risks, benefits and other options for treatment, the patient has consented to  Procedure(s) with comments: TOTAL HIP ARTHROPLASTY (Right) - zynrelef 400mg once as a surgical intervention.  The patient's history has been reviewed, patient examined, no change in status, stable for surgery.  I have reviewed the patient's chart and labs.  Questions were answered to the patient's satisfaction.     David Knox   

## 2022-05-17 ENCOUNTER — Other Ambulatory Visit: Payer: Self-pay | Admitting: Internal Medicine

## 2022-05-17 ENCOUNTER — Encounter (HOSPITAL_COMMUNITY): Payer: Self-pay | Admitting: Orthopedic Surgery

## 2022-05-17 ENCOUNTER — Other Ambulatory Visit: Payer: Self-pay | Admitting: Orthopedic Surgery

## 2022-05-17 DIAGNOSIS — M167 Other unilateral secondary osteoarthritis of hip: Secondary | ICD-10-CM | POA: Diagnosis not present

## 2022-05-17 LAB — CBC
HCT: 31.6 % — ABNORMAL LOW (ref 39.0–52.0)
Hemoglobin: 10.5 g/dL — ABNORMAL LOW (ref 13.0–17.0)
MCH: 30.9 pg (ref 26.0–34.0)
MCHC: 33.2 g/dL (ref 30.0–36.0)
MCV: 92.9 fL (ref 80.0–100.0)
Platelets: 262 10*3/uL (ref 150–400)
RBC: 3.4 MIL/uL — ABNORMAL LOW (ref 4.22–5.81)
RDW: 11.9 % (ref 11.5–15.5)
WBC: 9.6 10*3/uL (ref 4.0–10.5)
nRBC: 0 % (ref 0.0–0.2)

## 2022-05-17 LAB — BASIC METABOLIC PANEL
Anion gap: 6 (ref 5–15)
BUN: 15 mg/dL (ref 6–20)
CO2: 25 mmol/L (ref 22–32)
Calcium: 8.1 mg/dL — ABNORMAL LOW (ref 8.9–10.3)
Chloride: 103 mmol/L (ref 98–111)
Creatinine, Ser: 1.11 mg/dL (ref 0.61–1.24)
GFR, Estimated: >60 mL/min (ref >60)
Glucose, Bld: 187 mg/dL — ABNORMAL HIGH (ref 70–99)
Potassium: 3.8 mmol/L (ref 3.5–5.1)
Sodium: 134 mmol/L — ABNORMAL LOW (ref 135–145)

## 2022-05-17 MED ORDER — ACETAMINOPHEN 500 MG PO TABS: 1000.0000 mg | ORAL_TABLET | Freq: Four times a day (QID) | ORAL | 0 refills | Status: DC

## 2022-05-17 MED ORDER — APIXABAN 2.5 MG PO TABS
2.5000 mg | ORAL_TABLET | Freq: Two times a day (BID) | ORAL | Status: DC
  Administered 2022-05-17: 2.5 mg via ORAL
  Filled 2022-05-17: qty 1

## 2022-05-17 MED ORDER — APIXABAN 2.5 MG PO TABS: 2.5000 mg | ORAL_TABLET | Freq: Two times a day (BID) | ORAL | 0 refills | Status: DC

## 2022-05-17 MED ORDER — TRAMADOL HCL 50 MG PO TABS: 50.0000 mg | ORAL_TABLET | Freq: Four times a day (QID) | ORAL | 0 refills | Status: AC

## 2022-05-17 MED ORDER — DOCUSATE SODIUM 100 MG PO CAPS: 100.0000 mg | ORAL_CAPSULE | Freq: Two times a day (BID) | ORAL | 0 refills | Status: DC

## 2022-05-17 MED ORDER — METHOCARBAMOL 500 MG PO TABS: 500.0000 mg | ORAL_TABLET | Freq: Four times a day (QID) | ORAL | 0 refills | Status: DC | PRN

## 2022-05-17 MED ORDER — POLYETHYLENE GLYCOL 3350 17 G PO PACK: 17.0000 g | PACK | Freq: Every day | ORAL | 0 refills | Status: DC | PRN

## 2022-05-17 MED ORDER — OXYCODONE HCL 5 MG PO TABS: 5.0000 mg | ORAL_TABLET | Freq: Four times a day (QID) | ORAL | 0 refills | Status: AC | PRN

## 2022-05-17 NOTE — Progress Notes (Signed)
Patient ID: David Knox, male   DOB: 20-Oct-1966, 56 y.o.   MRN: [id number]   Postop day 1 status post right total hip  The patient is doing well in terms of his pain control  Nurses indicate he had a lot of bleeding from the proximal portion of his incision this was treated with a pressure dressing and looks dry today  We will monitor at noon to see if he can go home today or needs to be observed 1 more night  He did get up with therapy yesterday  His dorsiflexion and plantarflexion are normal his leg lengths are equal lying in the bed  Recommend monitoring the dressing for any blood loss Up with therapy today if he tolerates that well and his dressing is dry and go home later this afternoon I will switch him off of aspirin as he has some type of reaction to that and use a different anticoagulant

## 2022-05-17 NOTE — Progress Notes (Signed)
Physical Therapy Treatment Patient Details Name: David Knox MRN: [id number] DOB: 1966-07-30 Today's Date: 05/17/2022   History of Present Illness JUSTIS CLOSSER  has presented today for surgery, with the diagnosis of other secondary osteoarthritis of right hip.  The various methods of treatment have been discussed with the patient and family. After consideration of risks, benefits and other options for treatment, the patient has consented to  Procedure(s) with comments:  TOTAL HIP ARTHROPLASTY (Right)    PT Comments    Patient presents supine in bed and consents to PT treatment session. Patient is modified independent with bed mobility demonstrating improved technical form to complete task in shorter time period compared to initial evaluation. Still experiencing some discomfort with bed mobility. Good trunk control demonstrated. Patient is independent with transfers and use of RW. Good coordination and able to move with increased cadence. Patient tolerated therapeutic exercises and responded well to incorporation of endurance to help further strengthening and improving ROM. Patient was able to ambulate 200 feet in hallway with RW and supervision by PT. Gait pattern continues to normalize with increased ambulation distance. Minor gait deviations still present but demonstrates good balance and coordination without disturbance of gait pattern. Patient able to navigate up/down 4 stairs with supervision. Some discomfort with ambulating stairs but able to do sow with appropriate technical form and verbal cueing. Patient tolerated sitting up in chair after therapy with nursing staff notified of mobility status. Patient will benefit from continued skilled physical therapy in hospital and recommended venue below to increase strength, balance, endurance for safe ADLs and gait.    Recommendations for follow up therapy are one component of a multi-disciplinary discharge planning process, led by the  attending physician.  Recommendations may be updated based on patient status, additional functional criteria and insurance authorization.  Follow Up Recommendations  Home health PT     Assistance Recommended at Discharge PRN  Patient can return home with the following A little help with walking and/or transfers;Help with stairs or ramp for entrance   Equipment Recommendations  None recommended by PT    Recommendations for Other Services       Precautions / Restrictions Precautions Precautions: Fall Restrictions Weight Bearing Restrictions: Yes RLE Weight Bearing: Weight bearing as tolerated     Mobility  Bed Mobility Overal bed mobility: Modified Independent             General bed mobility comments: Modified independent with supine to to sit. Improved technical form with ability to complete task in shorter time period compared to inital evaluation. Still experiencing discomfort during bed mobility. Good trunk control.    Transfers Overall transfer level: Independent Equipment used: Rolling walker (2 wheels)               General transfer comment: Independent with transfers with use of RW for safety. Good coordination and able to move with faster cadence with good techincal form.    Ambulation/Gait Ambulation/Gait assistance: Supervision Gait Distance (Feet): 200 Feet Assistive device: Rolling walker (2 wheels) Gait Pattern/deviations: Step-to pattern, Decreased step length - left, Decreased step length - right, Decreased stride length, Narrow base of support Gait velocity: decreased     General Gait Details: Able to ambulate 200 feet with RW in hallway with supervision. Gait pattern continues to normalize with increased ambulation distance. Minor gait deviations still present but demonstrates good balance and coordination without disturbance to gait pattern   Stairs Stairs: Yes Stairs assistance: Supervision Stair Management: Two rails Number  of Stairs:  4 General stair comments: Able to navigate up/down 4 stairs with supervision. Some discomfort with ambulating stairs but able to do so with appropriate techincal form and verbal cueing.   Wheelchair Mobility    Modified Rankin (Stroke Patients Only)       Balance Overall balance assessment: Mild deficits observed, not formally tested                                          Cognition Arousal/Alertness: Awake/alert Behavior During Therapy: WFL for tasks assessed/performed Overall Cognitive Status: Within Functional Limits for tasks assessed                                          Exercises General Exercises - Lower Extremity Ankle Circles/Pumps: AROM, Supine, Strengthening, Right, 20 reps Quad Sets: AROM, Strengthening, Right, Supine, 15 reps Short Arc Quad: AROM, Strengthening, Supine, Right, 15 reps Heel Slides: AROM, Strengthening, Supine, Right, 15 reps    General Comments        Pertinent Vitals/Pain Pain Assessment Pain Assessment: Faces Faces Pain Scale: Hurts a little bit Pain Location: R hip Pain Descriptors / Indicators: Aching Pain Intervention(s): Limited activity within patient's tolerance, Monitored during session, Repositioned    Home Living                          Prior Function            PT Goals (current goals can now be found in the care plan section) Acute Rehab PT Goals Patient Stated Goal: return home PT Goal Formulation: With patient Time For Goal Achievement: 05/18/22 Potential to Achieve Goals: Good Progress towards PT goals: Progressing toward goals    Frequency    7X/week      PT Plan Current plan remains appropriate    Co-evaluation              AM-PAC PT "6 Clicks" Mobility   Outcome Measure  Help needed turning from your back to your side while in a flat bed without using bedrails?: None Help needed moving from lying on your back to sitting on the side of a flat  bed without using bedrails?: A Little Help needed moving to and from a bed to a chair (including a wheelchair)?: A Little Help needed standing up from a chair using your arms (e.g., wheelchair or bedside chair)?: A Little Help needed to walk in hospital room?: A Little Help needed climbing 3-5 steps with a railing? : A Little 6 Click Score: 19    End of Session   Activity Tolerance: Patient tolerated treatment well;No increased pain Patient left: in chair;with call bell/phone within reach Nurse Communication: Mobility status PT Visit Diagnosis: Unsteadiness on feet (R26.81);Other abnormalities of gait and mobility (R26.89);Muscle weakness (generalized) (M62.81)     Time: 3888-2800 PT Time Calculation (min) (ACUTE ONLY): 20 min  Charges:  $Gait Training: 8-22 mins $Therapeutic Exercise: 8-22 mins                     10:34 AM, 05/17/22 Arther Abbott, S/PT

## 2022-05-17 NOTE — Progress Notes (Signed)
Post-op dressing to right hip noted to be saturated with blood, with blood oozing from the top edge of dressing also, sheet had fair amount of blood that had leaked from dressing also. Post-op dressing removed, incision bleeding along top of edge incision line between staples. 4x4's, ABD pads and pressure dressing applied. Ice pack also applied.

## 2022-05-17 NOTE — Discharge Summary (Signed)
Physician Discharge Summary  Patient ID: David Knox MRN: [id number] DOB/AGE: 1966/10/05 56 y.o.  Admit date: 05/16/2022 Discharge date: 05/17/2022  Admission Diagnoses: Osteoarthritis right hip  Discharge Diagnoses:  Principal Problem:   Arthritis of right hip   Discharged Condition: Good  Hospital Course: Normal hospital course.  Surgery on July 11 right total hip direct lateral approach  No issues other than some drainage from his surgical wound.  Pain control  Leg lengths equal  Implants DePuy Pinnacle cup size 54 press-fit with bone graft autograft.  Tri-Lock stem.  Size 6.  +4 x 54 liner.,  Ceramic +5  Head   Discharge Exam: Blood pressure 118/85, pulse 83, temperature 98 F (36.7 C), resp. rate 16, height 5\' 11"  (1.803 m), weight 85.7 kg, SpO2 98 %.    Latest Ref Rng & Units 05/17/2022    5:32 AM 05/12/2022    3:45 PM 05/03/2021    3:24 PM  CBC  WBC 4.0 - 10.5 K/uL 9.6  6.8  6.3   Hemoglobin 13.0 - 17.0 g/dL 05/05/2021  93.2  35.5   Hematocrit 39.0 - 52.0 % 31.6  42.1  38.5   Platelets 150 - 400 K/uL 262  309  313       Latest Ref Rng & Units 05/17/2022    5:32 AM 05/12/2022    3:45 PM 04/18/2022    9:13 AM  BMP  Glucose 70 - 99 mg/dL 04/20/2022  81  202   BUN 6 - 20 mg/dL 15  13  16    Creatinine 0.61 - 1.24 mg/dL 542   7.06   BUN/Creat Ratio 9 - 20   13   Sodium 135 - 145 mmol/L 134  136  139   Potassium 3.5 - 5.1 mmol/L 3.8  3.6  4.8   Chloride 98 - 111 mmol/L 103  102  100   CO2 22 - 32 mmol/L 25  26  24    Calcium 8.9 - 10.3 mg/dL 8.1  8.8  9.7      Disposition: Discharge disposition: 01-Home or Self Care       Discharge Instructions     Call MD / Call 911   Complete by: As directed    If you experience chest pain or shortness of breath, CALL 911 and be transported to the hospital emergency room.  If you develope a fever above 101 F, pus (white drainage) or increased drainage or redness at the wound, or calf pain, call your surgeon's office.    Constipation Prevention   Complete by: As directed    Drink plenty of fluids.  Prune juice may be helpful.  You may use a stool softener, such as Colace (over the counter) 100 mg twice a day.  Use MiraLax (over the counter) for constipation as needed.   Diet - low sodium heart healthy   Complete by: As directed    Discharge instructions   Complete by: As directed    Check your incision multiple times a day if necessary change the bandage by placing gauze and tape over the wound.  If you see excessive bleeding please call the office [phone]  Follow the instructions given by therapy at the hospital  Activity as tolerated   Driving restrictions   Complete by: As directed    No driving for 2 weeks   Increase activity slowly as tolerated   Complete by: As directed    Post-operative opioid taper instructions:   Complete by: As directed  POST-OPERATIVE OPIOID TAPER INSTRUCTIONS: It is important to wean off of your opioid medication as soon as possible. If you do not need pain medication after your surgery it is ok to stop day one. Opioids include: Codeine, Hydrocodone(Norco, Vicodin), Oxycodone(Percocet, oxycontin) and hydromorphone amongst others.  Long term and even short term use of opiods can cause: Increased pain response Dependence Constipation Depression Respiratory depression And more.  Withdrawal symptoms can include Flu like symptoms Nausea, vomiting And more Techniques to manage these symptoms Hydrate well Eat regular healthy meals Stay active Use relaxation techniques(deep breathing, meditating, yoga) Do Not substitute Alcohol to help with tapering If you have been on opioids for less than two weeks and do not have pain than it is ok to stop all together.  Plan to wean off of opioids This plan should start within one week post op of your joint replacement. Maintain the same interval or time between taking each dose and first decrease the dose.  Cut the total  daily intake of opioids by one tablet each day Next start to increase the time between doses. The last dose that should be eliminated is the evening dose.         Allergies as of 05/17/2022   No Known Allergies      Medication List     TAKE these medications    acetaminophen 500 MG tablet Commonly known as: TYLENOL Take 2 tablets (1,000 mg total) by mouth every 6 (six) hours.   amLODipine 5 MG tablet Commonly known as: NORVASC Take 1 tablet (5 mg total) by mouth daily.   apixaban 2.5 MG Tabs tablet Commonly known as: ELIQUIS Take 1 tablet (2.5 mg total) by mouth 2 (two) times daily.   atorvastatin 20 MG tablet Commonly known as: LIPITOR Take 1 tablet (20 mg total) by mouth daily.   docusate sodium 100 MG capsule Commonly known as: COLACE Take 1 capsule (100 mg total) by mouth 2 (two) times daily.   famotidine 20 MG tablet Commonly known as: PEPCID TAKE 1 TABLET BY MOUTH TWICE A DAY   gabapentin 300 MG capsule Commonly known as: NEURONTIN Take 1 capsule (300 mg total) by mouth 2 (two) times daily. What changed: when to take this   losartan 50 MG tablet Commonly known as: COZAAR Take 50 mg by mouth daily.   methocarbamol 500 MG tablet Commonly known as: ROBAXIN Take 1 tablet (500 mg total) by mouth every 6 (six) hours as needed for muscle spasms.   naproxen 500 MG tablet Commonly known as: NAPROSYN Take 1 tablet (500 mg total) by mouth 2 (two) times daily with a meal.   omeprazole 20 MG tablet Commonly known as: PRILOSEC OTC Take 20 mg by mouth daily.   oxyCODONE 5 MG immediate release tablet Commonly known as: Oxy IR/ROXICODONE Take 1 tablet (5 mg total) by mouth every 6 (six) hours as needed for up to 7 days for severe pain.   polyethylene glycol 17 g packet Commonly known as: MIRALAX / GLYCOLAX Take 17 g by mouth daily as needed for mild constipation.   telmisartan 20 MG tablet Commonly known as: MICARDIS Take 1 tablet (20 mg total) by mouth  daily.   traMADol 50 MG tablet Commonly known as: ULTRAM Take 1 tablet (50 mg total) by mouth every 6 (six) hours for 7 days. For pain start with tramadol and Tylenol.  If the tramadol and Tylenol are not controlling the pain then take the oxycodone  Follow-up Information     Vickki Hearing, MD Follow up on 05/31/2022.   Specialties: Orthopedic Surgery, Radiology Why: For wound re-check Contact information: [address] [place] [address] [phone]                 Signed: Fuller Canada 05/17/2022, 10:15 AM

## 2022-05-18 ENCOUNTER — Telehealth: Payer: Self-pay | Admitting: Internal Medicine

## 2022-05-18 ENCOUNTER — Other Ambulatory Visit: Payer: Self-pay | Admitting: *Deleted

## 2022-05-18 ENCOUNTER — Emergency Department (HOSPITAL_COMMUNITY): Payer: 59

## 2022-05-18 ENCOUNTER — Telehealth: Payer: Self-pay

## 2022-05-18 ENCOUNTER — Emergency Department (HOSPITAL_COMMUNITY)
Admission: EM | Admit: 2022-05-18 | Discharge: 2022-05-18 | Disposition: A | Discharge status: Home / Self Care | Payer: 59 | Attending: Emergency Medicine | Admitting: Emergency Medicine

## 2022-05-18 ENCOUNTER — Telehealth: Payer: Self-pay | Admitting: Orthopedic Surgery

## 2022-05-18 ENCOUNTER — Encounter (HOSPITAL_COMMUNITY): Payer: Self-pay

## 2022-05-18 ENCOUNTER — Other Ambulatory Visit: Payer: Self-pay

## 2022-05-18 DIAGNOSIS — Z7901 Long term (current) use of anticoagulants: Secondary | ICD-10-CM | POA: Diagnosis not present

## 2022-05-18 DIAGNOSIS — D649 Anemia, unspecified: Secondary | ICD-10-CM | POA: Insufficient documentation

## 2022-05-18 DIAGNOSIS — I10 Essential (primary) hypertension: Secondary | ICD-10-CM

## 2022-05-18 DIAGNOSIS — R000 Tachycardia, unspecified: Secondary | ICD-10-CM | POA: Insufficient documentation

## 2022-05-18 DIAGNOSIS — Z79899 Other long term (current) drug therapy: Secondary | ICD-10-CM | POA: Diagnosis not present

## 2022-05-18 LAB — COMPREHENSIVE METABOLIC PANEL
ALT: 33 U/L (ref 0–44)
AST: 48 U/L — ABNORMAL HIGH (ref 15–41)
Albumin: 3.5 g/dL (ref 3.5–5.0)
Alkaline Phosphatase: 47 U/L (ref 38–126)
Anion gap: 10 (ref 5–15)
BUN: 27 mg/dL — ABNORMAL HIGH (ref 6–20)
CO2: 22 mmol/L (ref 22–32)
Calcium: 8.4 mg/dL — ABNORMAL LOW (ref 8.9–10.3)
Chloride: 102 mmol/L (ref 98–111)
Creatinine, Ser: 1.28 mg/dL — ABNORMAL HIGH (ref 0.61–1.24)
GFR, Estimated: >60 mL/min (ref >60)
Glucose, Bld: 156 mg/dL — ABNORMAL HIGH (ref 70–99)
Potassium: 3.2 mmol/L — ABNORMAL LOW (ref 3.5–5.1)
Sodium: 134 mmol/L — ABNORMAL LOW (ref 135–145)
Total Bilirubin: 0.3 mg/dL (ref 0.3–1.2)
Total Protein: 6.6 g/dL (ref 6.5–8.1)

## 2022-05-18 LAB — CBC WITH DIFFERENTIAL/PLATELET
Abs Immature Granulocytes: 0.05 10*3/uL (ref 0.00–0.07)
Basophils Absolute: 0 10*3/uL (ref 0.0–0.1)
Basophils Relative: 0 %
Eosinophils Absolute: 0 10*3/uL (ref 0.0–0.5)
Eosinophils Relative: 0 %
HCT: 26.9 % — ABNORMAL LOW (ref 39.0–52.0)
Hemoglobin: 8.9 g/dL — ABNORMAL LOW (ref 13.0–17.0)
Immature Granulocytes: 1 %
Lymphocytes Relative: 25 %
Lymphs Abs: 2.3 10*3/uL (ref 0.7–4.0)
MCH: 31.3 pg (ref 26.0–34.0)
MCHC: 33.1 g/dL (ref 30.0–36.0)
MCV: 94.7 fL (ref 80.0–100.0)
Monocytes Absolute: 0.9 10*3/uL (ref 0.1–1.0)
Monocytes Relative: 10 %
Neutro Abs: 6.1 10*3/uL (ref 1.7–7.7)
Neutrophils Relative %: 64 %
Platelets: 251 10*3/uL (ref 150–400)
RBC: 2.84 MIL/uL — ABNORMAL LOW (ref 4.22–5.81)
RDW: 12.3 % (ref 11.5–15.5)
WBC: 9.4 10*3/uL (ref 4.0–10.5)
nRBC: 0 % (ref 0.0–0.2)

## 2022-05-18 LAB — MAGNESIUM: Magnesium: 1.7 mg/dL (ref 1.7–2.4)

## 2022-05-18 LAB — D-DIMER, QUANTITATIVE: D-Dimer, Quant: 0.59 ug/mL-FEU — ABNORMAL HIGH (ref 0.00–0.50)

## 2022-05-18 MED ORDER — IOHEXOL 350 MG/ML SOLN
100.0000 mL | Freq: Once | INTRAVENOUS | Status: AC | PRN
  Administered 2022-05-18: 80 mL via INTRAVENOUS

## 2022-05-18 MED ORDER — FENTANYL CITRATE PF 50 MCG/ML IJ SOSY
50.0000 ug | PREFILLED_SYRINGE | Freq: Once | INTRAMUSCULAR | Status: AC
  Administered 2022-05-18: 50 ug via INTRAVENOUS
  Filled 2022-05-18: qty 1

## 2022-05-18 MED ORDER — LACTATED RINGERS IV BOLUS
1000.0000 mL | Freq: Once | INTRAVENOUS | Status: AC
  Administered 2022-05-18: 1000 mL via INTRAVENOUS

## 2022-05-18 MED ORDER — POTASSIUM CHLORIDE CRYS ER 20 MEQ PO TBCR
40.0000 meq | EXTENDED_RELEASE_TABLET | Freq: Once | ORAL | Status: AC
  Administered 2022-05-18: 40 meq via ORAL
  Filled 2022-05-18: qty 2

## 2022-05-18 MED ORDER — TELMISARTAN 20 MG PO TABS: 20.0000 mg | ORAL_TABLET | Freq: Every day | ORAL | 3 refills | Status: DC

## 2022-05-18 NOTE — ED Notes (Signed)
Patient transported to X-ray 

## 2022-05-18 NOTE — Telephone Encounter (Signed)
Error medicatiion sent to pharmacy

## 2022-05-18 NOTE — Telephone Encounter (Signed)
Patient advised with verbal understanding  

## 2022-05-18 NOTE — ED Triage Notes (Signed)
Pt presents to ED, sent by physical therapy, states PT said HR was up today. Pt had hip surgery on 7/11

## 2022-05-18 NOTE — ED Notes (Signed)
ED Provider at bedside. 

## 2022-05-18 NOTE — ED Provider Notes (Signed)
[facility] EMERGENCY DEPARTMENT Provider Note   CSN: [id number] Arrival date & time: 05/18/22  1529     History  Chief Complaint  Patient presents with   Tachycardia    David Knox is a 56 y.o. male.  HPI 56 year old male presents with tachycardia.  He is otherwise asymptomatic.  He had right hip surgery by Dr. Romeo Apple on 7/11 and got out of the hospital yesterday.  PT came in and noticed his heart rate was tachycardic and gave him an hour and kept checking it but it was still tachycardic.  Other vital signs seem to be okay.  He denies fever.  He states his pain is mild to moderate but feels like is doing fine.  He specifically denies chest pain, dyspnea, or exertional dyspnea.  He does not think he had this problem before.  Has been compliant with all of his medicines.  Home Medications Prior to Admission medications   Medication Sig Start Date End Date Taking? Authorizing Provider  acetaminophen (TYLENOL) 500 MG tablet Take 2 tablets (1,000 mg total) by mouth every 6 (six) hours. 05/17/22  Yes Vickki Hearing, MD  amLODipine (NORVASC) 5 MG tablet Take 1 tablet (5 mg total) by mouth daily. 04/05/22  Yes Anabel Halon, MD  atorvastatin (LIPITOR) 20 MG tablet Take 1 tablet (20 mg total) by mouth daily. 04/05/22  Yes Anabel Halon, MD  docusate sodium (COLACE) 100 MG capsule Take 1 capsule (100 mg total) by mouth 2 (two) times daily. 05/17/22  Yes Vickki Hearing, MD  ELIQUIS 2.5 MG TABS tablet TAKE 1 TABLET BY MOUTH TWICE A DAY 05/18/22  Yes Vickki Hearing, MD  gabapentin (NEURONTIN) 300 MG capsule Take 1 capsule (300 mg total) by mouth 2 (two) times daily. Patient taking differently: Take 300 mg by mouth daily. 05/01/22  Yes Anabel Halon, MD  losartan (COZAAR) 50 MG tablet Take 50 mg by mouth daily.   Yes [provider]  methocarbamol (ROBAXIN) 500 MG tablet Take 1 tablet (500 mg total) by mouth every 6 (six) hours as needed for muscle spasms. 05/17/22   Yes Vickki Hearing, MD  naproxen (NAPROSYN) 500 MG tablet Take 1 tablet (500 mg total) by mouth 2 (two) times daily with a meal. 03/22/22  Yes Vickki Hearing, MD  omeprazole (PRILOSEC OTC) 20 MG tablet Take 20 mg by mouth daily.   Yes [provider]  oxyCODONE (OXY IR/ROXICODONE) 5 MG immediate release tablet Take 1 tablet (5 mg total) by mouth every 6 (six) hours as needed for up to 7 days for severe pain. 05/17/22 05/24/22 Yes Vickki Hearing, MD  traMADol (ULTRAM) 50 MG tablet Take 1 tablet (50 mg total) by mouth every 6 (six) hours for 7 days. For pain start with tramadol and Tylenol.  If the tramadol and Tylenol are not controlling the pain then take the oxycodone 05/17/22 05/24/22 Yes Vickki Hearing, MD  famotidine (PEPCID) 20 MG tablet TAKE 1 TABLET BY MOUTH TWICE A DAY Patient not taking: Reported on 05/18/2022 05/11/21   Anabel Halon, MD  polyethylene glycol powder (GLYCOLAX/MIRALAX) 17 GM/SCOOP powder DISSOLVE 17 GRAMS IN 8 OZ OF FLUID LIQUID DRINK DAILY AS DIRECTED Patient not taking: Reported on 05/18/2022 05/17/22   Vickki Hearing, MD  telmisartan (MICARDIS) 20 MG tablet Take 1 tablet (20 mg total) by mouth daily. Patient not taking: Reported on 05/18/2022 05/18/22   Anabel Halon, MD  Allergies    Aspirin    Review of Systems   Review of Systems  Constitutional:  Negative for fever.  Respiratory:  Negative for cough and shortness of breath.   Cardiovascular:  Negative for chest pain and palpitations.  Gastrointestinal:  Negative for abdominal pain, diarrhea and vomiting.  Neurological:  Negative for weakness and light-headedness.    Physical Exam Updated Vital Signs BP 110/78 (BP Location: Left Arm)   Pulse 98   Temp 98.1 F (36.7 C) (Oral)   Resp 12   Ht 5\' 11"  (1.803 m)   Wt 76.7 kg   SpO2 100%   BMI 23.57 kg/m  Physical Exam Vitals and nursing note reviewed.  Constitutional:      Appearance: He is well-developed.  HENT:      Head: Normocephalic and atraumatic.  Cardiovascular:     Rate and Rhythm: Regular rhythm. Tachycardia present.     Pulses:          Dorsalis pedis pulses are 2+ on the right side.     Heart sounds: Normal heart sounds.     Comments: HR~115 Pulmonary:     Effort: Pulmonary effort is normal.     Breath sounds: Normal breath sounds.  Abdominal:     Palpations: Abdomen is soft.     Tenderness: There is no abdominal tenderness.  Musculoskeletal:     Comments: Postoperative dressing is in place.  There is more swelling than I would expect, primarily laterally, to the thigh. He feels like this goes into his knee. No medial tenderness.  No erythema  Skin:    General: Skin is warm and dry.  Neurological:     Mental Status: He is alert.     ED Results / Procedures / Treatments   Labs (all labs ordered are listed, but only abnormal results are displayed) Labs Reviewed  COMPREHENSIVE METABOLIC PANEL - Abnormal; Notable for the following components:      Result Value   Sodium 134 (*)    Potassium 3.2 (*)    Glucose, Bld 156 (*)    BUN 27 (*)    Creatinine, Ser 1.28 (*)    Calcium 8.4 (*)    AST 48 (*)    All other components within normal limits  CBC WITH DIFFERENTIAL/PLATELET - Abnormal; Notable for the following components:   RBC 2.84 (*)    Hemoglobin 8.9 (*)    HCT 26.9 (*)    All other components within normal limits  D-DIMER, QUANTITATIVE - Abnormal; Notable for the following components:   D-Dimer, Quant 0.59 (*)    All other components within normal limits  MAGNESIUM    EKG EKG Interpretation  Date/Time:  Thursday May 18 2022 15:46:04 EDT Ventricular Rate:  115 PR Interval:  160 QRS Duration: 74 QT Interval:  304 QTC Calculation: 421 R Axis:   56 Text Interpretation: Sinus tachycardia Borderline T abnormalities, diffuse leads rate is faster with nonspecific T waves, probably rate related Confirmed by [id number] [phone]) on 05/18/2022 3:51:03 PM  Radiology CT  ANGIO LOW EXTREM RIGHT W &/OR WO CONTRAST  Result Date: 05/18/2022 CLINICAL DATA:  Upper leg trauma, recent hip operation. Swelling. Concern for bleeding. EXAM: CT OF THE LOWER RIGHT EXTREMITY WITH CONTRAST TECHNIQUE: Multidetector CT imaging of the lower right extremity was performed according to the standard protocol following intravenous contrast administration. RADIATION DOSE REDUCTION: This exam was performed according to the departmental dose-optimization program which includes automated exposure control, adjustment of the mA and/or  kV according to patient size and/or use of iterative reconstruction technique. CONTRAST:  35mL OMNIPAQUE IOHEXOL 350 MG/ML SOLN COMPARISON:  None Available. FINDINGS: Bones/Joint/Cartilage There is a right hip total arthroplasty in anatomic alignment. There is no evidence for acute fracture or hardware loosening. No focal osseous lesion. Ligaments Suboptimally assessed by CT. Muscles and Tendons There is deep fascial plane edema and air in the upper thigh and lateral to the right hip compatible with recent surgery. No enhancing fluid collections are identified. Soft tissues There is lateral hip subcutaneous edema, air and skin staples compatible with recent surgery. No enhancing fluid collections are identified. There is adequate opacification of arterial structures. The right common iliac, external iliac, common femoral, superficial femoral, deep femoral and popliteal arteries appear patent without evidence for dissection, thrombosis or significant stenosis. No abnormal areas of enhancement identified to suggest active bleeding. IMPRESSION: 1. Subcutaneous and deep fascial plane edema and air in the upper thigh and lateral hip compatible with recent surgery. No acute hematoma. No enhancing fluid collection identified. 2. No acute vascular pathology identified in the right lower extremity. 3. Right hip arthroplasty in anatomic alignment. Electronically Signed   By: Darliss Cheney  M.D.   On: 05/18/2022 18:38   DG Chest 2 View  Result Date: 05/18/2022 CLINICAL DATA:  Provided history: Tachycardia. Recent hip surgery. EXAM: CHEST - 2 VIEW COMPARISON:  Radiographs of the chest and right ribs 03/26/2020. Prior chest radiographs 12/28/2015 and earlier. FINDINGS: Heart size within normal limits. No appreciable airspace consolidation. No evidence of pleural effusion or pneumothorax. No acute bony abnormality identified. IMPRESSION: No evidence of active cardiopulmonary disease. Electronically Signed   By: Jackey Loge D.O.   On: 05/18/2022 16:29    Procedures Procedures    Medications Ordered in ED Medications  lactated ringers bolus 1,000 mL (0 mLs Intravenous Stopped 05/18/22 1918)  fentaNYL (SUBLIMAZE) injection 50 mcg (50 mcg Intravenous Given 05/18/22 1743)  iohexol (OMNIPAQUE) 350 MG/ML injection 100 mL (80 mLs Intravenous Contrast Given 05/18/22 1810)  potassium chloride SA (KLOR-CON M) CR tablet 40 mEq (40 mEq Oral Given 05/18/22 1915)    ED Course/ Medical Decision Making/ A&P Clinical Course as of 05/19/22 0012  Thu May 18, 2022  1722 D-dimer is mildly elevated.  While I think PE is less likely, will get CTA.  On reeval, he tells me that his hip wound was bleeding extensively the night of the surgery 2 nights ago.  No longer bleeding.  However he feels like his leg has gotten some worsening swelling.  We will also get CTA of the leg.  Strong DP pulse. [SG]  1726 Due to potential dye load, rad tech is worried about 2 angio scans (which have to be different dye loads).  Given no chest symptoms as well as objective findings of swelling to the leg and a mild drop in his hemoglobin, I think CTA of the chest is probably not necessary despite the mildly elevated D-dimer.  My suspicion of PE is pretty low, the only risk factor is the recent surgery but again no PE like symptoms.  Thus we will hold off on CTA of the chest. [SG]    Clinical Course User Index [SG] Pricilla Loveless, MD                           Medical Decision Making Amount and/or Complexity of Data Reviewed Labs: ordered.    Details: Hemoglobin of 8.9, 1.5  points lower than yesterday.  Mild bump in creatinine and mild hypokalemia Radiology: ordered and independent interpretation performed.    Details: CT angio of the leg without acute bleeding or hematoma ECG/medicine tests: ordered and independent interpretation performed.    Details: Sinus tachycardia.  Risk Prescription drug management.   Patient's hemoglobin is lower than it was yesterday although it is hard to tell if this was an equilibration issue.  He tells me that he had significant bleeding while in the hospital.  At this point his heart rate is normalized with pain control and fluids.  My suspicion of PE is pretty low as above.  At this point, he specifically denies melena or GI bleeding/rectal bleeding.  His blood count is lower, he is feeling much better and appears stable for discharge.  He does not want to stay any longer which I do not think is unreasonable.  He specifically denies chest pain or shortness of breath again and so I think if he develops new or worsening symptoms he should come back but otherwise his PCP should recheck a hemoglobin in the next 24-48 hours.        Final Clinical Impression(s) / ED Diagnoses Final diagnoses:  Sinus tachycardia  Anemia, unspecified type    Rx / DC Orders ED Discharge Orders     None         Pricilla Loveless, MD 05/19/22 0013

## 2022-05-18 NOTE — Telephone Encounter (Signed)
Patient would like   telmisartan (MICARDIS) 20 MG tablet  Sent in to pharm, patient was unable to pick up medication when originally prescribed.

## 2022-05-18 NOTE — ED Notes (Signed)
Pt given ice water and graham crackers ?

## 2022-05-18 NOTE — Telephone Encounter (Signed)
Call received from Dorene Sorrow [facility], direct ph# [phone], relaying that their physical therapist is seeing patient today, 05/18/22, and that his heart rate is staying elevated - states it is at 120, consistently. Relays wanted Dr Romeo Apple to be aware; states they have contacted patient's primary care doctor.

## 2022-05-18 NOTE — Telephone Encounter (Signed)
David Knox   Patient sent to ER

## 2022-05-18 NOTE — Discharge Instructions (Addendum)
Your hemoglobin is 8.9 which is lower than when you left the hospital.  This could all be from blood loss from the surgery but she will need to get this rechecked in the next 24-48 hours.  If at any point you develop chest pain, shortness of breath, dizziness or lightheadedness, bleeding, or any other new/concerning symptoms then return to the ER for evaluation.

## 2022-05-18 NOTE — ED Notes (Signed)
Patient transported to CT 

## 2022-05-18 NOTE — Telephone Encounter (Signed)
Misty Stanley, physical therapist with [facility] called in to report that she is at the pt's home with him now and he has had a heart rate of 120 for the last 45 mins to an hour even at rest. She states that the pt has recently had hip surgery. Spoke with Dr Allena Katz, advised to go to the ER due to this not being a safe heart rate since it's been at this number for so long. Misty Stanley verbalized understanding and will let pt know.

## 2022-05-19 ENCOUNTER — Telehealth: Payer: Self-pay | Admitting: Orthopedic Surgery

## 2022-05-19 ENCOUNTER — Telehealth: Payer: Self-pay | Admitting: *Deleted

## 2022-05-19 NOTE — Telephone Encounter (Signed)
Transition Care Management Follow-up Telephone Call Date of discharge and from where: 05-17-22 [facility] How have you been since you were released from the hospital? Had to go back to ER  Any questions or concerns? Yes  Items Reviewed: Did the pt receive and understand the discharge instructions provided? Yes  Medications obtained and verified? Yes  Other? No  Any new allergies since your discharge? No  Dietary orders reviewed? Yes Do you have support at home? Yes   Home Care and Equipment/Supplies: Were home health services ordered? not applicable If so, what is the name of the agency? NA  Has the agency set up a time to come to the patient's home? not applicable Were any new equipment or medical supplies ordered?  No What is the name of the medical supply agency? NA Were you able to get the supplies/equipment? not applicable Do you have any questions related to the use of the equipment or supplies? No  Functional Questionnaire: (I = Independent and D = Dependent) ADLs: i  Bathing/Dressing- i  Meal Prep- i  Eating- i  Maintaining continence- i  Transferring/Ambulation- i  Managing Meds- i  Follow up appointments reviewed:  PCP Hospital f/u appt confirmed? Yes  Scheduled to see [place] on 05-24-22 @ 2:00. Specialist Hospital f/u appt confirmed? Yes  Are transportation arrangements needed? No  If their condition worsens, is the pt aware to call PCP or go to the Emergency Dept.? Yes Was the patient provided with contact information for the PCP's office or ED? Yes Was to pt encouraged to call back with questions or concerns? Yes

## 2022-05-19 NOTE — Telephone Encounter (Signed)
Reached and scheduled / done.

## 2022-05-19 NOTE — Telephone Encounter (Signed)
Patient just called today to relay his 'bandages are coming off, from his hip' -  surgery was done 05/16/22. Please advise.

## 2022-05-19 NOTE — Telephone Encounter (Signed)
Patient called back - said his heart rate is fine. States he went to ER and was treated with fluids, potassium; said CT was done, and nothing was found on that; said he was sent home and is doing okay.

## 2022-05-22 ENCOUNTER — Ambulatory Visit (INDEPENDENT_AMBULATORY_CARE_PROVIDER_SITE_OTHER): Payer: 59 | Admitting: Orthopedic Surgery

## 2022-05-22 ENCOUNTER — Encounter: Payer: Self-pay | Admitting: Orthopedic Surgery

## 2022-05-22 DIAGNOSIS — Z96641 Presence of right artificial hip joint: Secondary | ICD-10-CM

## 2022-05-22 NOTE — Progress Notes (Signed)
FOLLOW UP   Encounter Diagnosis  Name Primary?   History of total right hip replacement Yes     Chief Complaint  Patient presents with   Dressing Change    Right hip replacement 05/16/22     David Knox had a right total hip replacement on the 11th discharged on the 12th went back to the ER due to tachycardia which was related to his blood pressure medications  He says he feels fine.  He did have some wound drainage and needed his dressing change.  The dressing was soiled but the wound itself is clean dry and intact see media pictures  Recommend change as needed follow-up for staple removal continue therapy as instructed

## 2022-05-24 ENCOUNTER — Ambulatory Visit (INDEPENDENT_AMBULATORY_CARE_PROVIDER_SITE_OTHER): Payer: 59 | Admitting: Nurse Practitioner

## 2022-05-24 ENCOUNTER — Encounter: Payer: Self-pay | Admitting: Nurse Practitioner

## 2022-05-24 VITALS — BP 116/72 | HR 91 | Ht 71.0 in | Wt 192.1 lb

## 2022-05-24 DIAGNOSIS — D649 Anemia, unspecified: Secondary | ICD-10-CM

## 2022-05-24 DIAGNOSIS — D62 Acute posthemorrhagic anemia: Secondary | ICD-10-CM | POA: Diagnosis not present

## 2022-05-24 DIAGNOSIS — Z09 Encounter for follow-up examination after completed treatment for conditions other than malignant neoplasm: Secondary | ICD-10-CM

## 2022-05-24 DIAGNOSIS — E876 Hypokalemia: Secondary | ICD-10-CM

## 2022-05-24 DIAGNOSIS — M1611 Unilateral primary osteoarthritis, right hip: Secondary | ICD-10-CM | POA: Diagnosis not present

## 2022-05-24 NOTE — Progress Notes (Addendum)
David Knox     MRN: [id number]      DOB: 1966/06/04   HPI Mr. Lehrmann with past medical history of hypertension, right hip arthritis, peripheral neuropathy is here for follow up for hospital admission for right hip surgery.  Had Right Hip surgery on 7/11, DC on 7/12, patient stated that had elevated Heart rate while at home during PT on 05/18/2022, he went to the emergency room where he was evaluated.  He had potassium level of 3.potassium was replaced at the ED, CBC showed anemia.  he has been using the walker as needed during ambulation. Still doing PT three days a week. He currently denies pain, numbness tingling. Still taking eliquis 2.65m BID , pain medication as needed.  Has upcoming appointment with otho next week .         ROS Denies recent fever or chills. Denies sinus pressure, nasal congestion, ear pain or sore throat. Denies chest congestion, productive cough or wheezing. Denies chest pains, palpitations and leg swelling Denies abdominal pain, nausea, vomiting,diarrhea or constipation.   Denies dysuria, frequency, hesitancy or incontinence. Denies headaches, seizures, numbness, or tingling. Denies depression, anxiety or insomnia.   PE  BP 116/72 (BP Location: Right Arm, Patient Position: Sitting, Cuff Size: Normal)   Pulse 91   Ht _0  (1.803 m)   Wt 192 lb 1.9 oz (87.1 kg)   SpO2 93%   BMI 26.80 kg/m   Patient alert and oriented and in no cardiopulmonary distress.  Chest: Clear to auscultation bilaterally.  CVS: S1, S2 no murmurs, no S3.Regular rate.  ABD: Soft non tender.   Ext: No edema  MS: Left leg with nonpitting edema ,has palpable pedal pulse, leg feels warm to touch patient able to ambulate using a walker, dressing dry and intact old drainage noted.  Psych: Good eye contact, normal affect. Memory intact not anxious or depressed appearing.    Assessment & Plan  Arthritis of right hip Doing well since after having surgery on  7/11 Continues PT at home 3 days a week Patient encouraged to follow-up with orthopedics as planned Use of walker to prevent falls discussed with patient  Hypokalemia Lab Results  Component Value Date   NA 134 (L) 05/18/2022   K 3.2 (L) 05/18/2022   CO2 22 05/18/2022   GLUCOSE 156 (H) 05/18/2022   BUN 27 (H) 05/18/2022   CREATININE 1.28 (H) 05/18/2022   CALCIUM 8.4 (L) 05/18/2022   EGFR 70 04/18/2022   GFRNONAA >60 05/18/2022  Check potassium level, 40 of K given at the ED  Hospital discharge follow-up for hospital admission for right hip surgery.  Had Right Hip surgery on 7/11, DC on 7/12, patient stated that had elevated Heart rate while at home during PT on 05/18/2022, he went to the emergency room where he was evaluated.  He had potassium level of 3.2potassium was replaced at the ED, CBC showed anemia.  he has been using the walker as needed during ambulation. Still doing PT three days a week. He currently denies pain . Still taking eliquis 2.526mBID , pain medication as needed.  Has upcoming appointment with otho next week .   Hospital discharge summary and labs reviewed today.  Anemia Lab Results  Component Value Date   WBC 9.4 05/18/2022   HGB 8.9 (L) 05/18/2022   HCT 26.9 (L) 05/18/2022   MCV 94.7 05/18/2022   PLT 251 05/18/2022  could be due to blood loss during surgery. Currently on eliquis ,  denies hematemesis, hematochezia   Check CBC in 2 weeks

## 2022-05-24 NOTE — Assessment & Plan Note (Addendum)
Lab Results  Component Value Date   WBC 9.4 05/18/2022   HGB 8.9 (L) 05/18/2022   HCT 26.9 (L) 05/18/2022   MCV 94.7 05/18/2022   PLT 251 05/18/2022  could be due to blood loss during surgery. Currently on eliquis , denies hematemesis, hematochezia   Check CBC in 2 weeks

## 2022-05-24 NOTE — Assessment & Plan Note (Signed)
for hospital admission for right hip surgery.  Had Right Hip surgery on 7/11, DC on 7/12, patient stated that had elevated Heart rate while at home during PT on 05/18/2022, he went to the emergency room where he was evaluated.  He had potassium level of 3.2potassium was replaced at the ED, CBC showed anemia.  he has been using the walker as needed during ambulation. Still doing PT three days a week. He currently denies pain . Still taking eliquis 2.5mg  BID , pain medication as needed.  Has upcoming appointment with otho next week .   Hospital discharge summary and labs reviewed today.

## 2022-05-24 NOTE — Patient Instructions (Addendum)
Please get your shingles vaccine, TDAP vaccine at your pharmacy.   It is important that you exercise regularly at least 30 minutes 5 times a week as tolerated   Think about what you will eat, plan ahead. Choose " clean, green, fresh or frozen" over canned, processed or packaged foods which are more sugary, salty and fatty. 70 to 75% of food eaten should be vegetables and fruit. Three meals at set times with snacks allowed between meals, but they must be fruit or vegetables. Aim to eat over a 12 hour period , example 7 am to 7 pm, and STOP after  your last meal of the day. Drink water,generally about 64 ounces per day, no other drink is as healthy. Fruit juice is best enjoyed in a healthy way, by EATING the fruit.  Thanks for choosing [facility], we consider it a privelige to serve you.

## 2022-05-24 NOTE — Assessment & Plan Note (Signed)
Doing well since after having surgery on 7/11 Continues PT at home 3 days a week Patient encouraged to follow-up with orthopedics as planned Use of walker to prevent falls discussed with patient

## 2022-05-24 NOTE — Assessment & Plan Note (Addendum)
Lab Results  Component Value Date   NA 134 (L) 05/18/2022   K 3.2 (L) 05/18/2022   CO2 22 05/18/2022   GLUCOSE 156 (H) 05/18/2022   BUN 27 (H) 05/18/2022   CREATININE 1.28 (H) 05/18/2022   CALCIUM 8.4 (L) 05/18/2022   EGFR 70 04/18/2022   GFRNONAA >60 05/18/2022  Check potassium level, 40 of K given at the ED

## 2022-05-25 LAB — BASIC METABOLIC PANEL
BUN/Creatinine Ratio: 10 (ref 9–20)
BUN: 9 mg/dL (ref 6–24)
CO2: 24 mmol/L (ref 20–29)
Calcium: 9.1 mg/dL (ref 8.7–10.2)
Chloride: 100 mmol/L (ref 96–106)
Creatinine, Ser: 0.91 mg/dL (ref 0.76–1.27)
Glucose: 252 mg/dL — ABNORMAL HIGH (ref 70–99)
Potassium: 4.7 mmol/L (ref 3.5–5.2)
Sodium: 140 mmol/L (ref 134–144)
eGFR: 100 mL/min/{1.73_m2} (ref >59)

## 2022-05-25 NOTE — Progress Notes (Signed)
Normal Kidney function and electrolytes

## 2022-05-31 ENCOUNTER — Ambulatory Visit (INDEPENDENT_AMBULATORY_CARE_PROVIDER_SITE_OTHER): Payer: 59 | Admitting: Orthopedic Surgery

## 2022-05-31 ENCOUNTER — Encounter: Payer: Self-pay | Admitting: Orthopedic Surgery

## 2022-05-31 DIAGNOSIS — Z96641 Presence of right artificial hip joint: Secondary | ICD-10-CM

## 2022-05-31 MED ORDER — HYDROCODONE-ACETAMINOPHEN 7.5-325 MG PO TABS: 1.0000 | ORAL_TABLET | Freq: Four times a day (QID) | ORAL | 0 refills | Status: AC | PRN

## 2022-05-31 NOTE — Progress Notes (Unsigned)
FOLLOW UP   Encounter Diagnosis  Name Primary?   History of total right hip replacement Yes     Chief Complaint  Patient presents with   Post-op Follow-up    Right hip replacement 05/16/22 staples removed without difficulty, please check incision.      Mr. Buresh is doing well 2 weeks postop he has some serous drainage from his wound  There are no signs of infection  He is ambulating with a cane  He takes pain medicine usually at night but may be twice a day and is taking oxycodone  Redressing of the wound I gave him some dressings to change as needed  I changed him to hydrocodone  Follow-up in 4 weeks  Continue Eliquis for that time  Meds ordered this encounter  Medications   HYDROcodone-acetaminophen (NORCO) 7.5-325 MG tablet    Sig: Take 1 tablet by mouth every 6 (six) hours as needed for up to 5 days for moderate pain.    Dispense:  20 tablet    Refill:  0

## 2022-06-01 ENCOUNTER — Telehealth: Payer: Self-pay

## 2022-06-01 NOTE — Telephone Encounter (Signed)
I called him to advise, he voiced understanding  

## 2022-06-01 NOTE — Telephone Encounter (Signed)
Patient called stating that his pharmacy told him that they could not fill 2 prescriptions for narcotics within a 90 day time frame.  He wants to know if there is anything Dr. Romeo Apple can do he is in pain.  Please call and advise [phone]

## 2022-06-01 NOTE — Telephone Encounter (Signed)
I will call pharmacy then call patient

## 2022-06-01 NOTE — Telephone Encounter (Signed)
His [facility] kicked out the Hydrocodone Rx he can only get one Rx every 29 days if he pays out of pocked $36.09  Nothing the pharmacy can do about this, or [place], he may want to call his [facility].

## 2022-06-02 ENCOUNTER — Ambulatory Visit (HOSPITAL_COMMUNITY): Payer: 59 | Attending: Orthopedic Surgery | Admitting: Physical Therapy

## 2022-06-02 ENCOUNTER — Encounter (HOSPITAL_COMMUNITY): Payer: Self-pay | Admitting: Physical Therapy

## 2022-06-02 DIAGNOSIS — M167 Other unilateral secondary osteoarthritis of hip: Secondary | ICD-10-CM | POA: Insufficient documentation

## 2022-06-02 DIAGNOSIS — R29898 Other symptoms and signs involving the musculoskeletal system: Secondary | ICD-10-CM | POA: Diagnosis present

## 2022-06-02 DIAGNOSIS — M6281 Muscle weakness (generalized): Secondary | ICD-10-CM | POA: Diagnosis present

## 2022-06-02 DIAGNOSIS — M25551 Pain in right hip: Secondary | ICD-10-CM | POA: Insufficient documentation

## 2022-06-02 DIAGNOSIS — R2689 Other abnormalities of gait and mobility: Secondary | ICD-10-CM | POA: Diagnosis present

## 2022-06-02 NOTE — Therapy (Signed)
OUTPATIENT PHYSICAL THERAPY LOWER EXTREMITY EVALUATION   Patient Name: David Knox MRN: [id number] DOB:1965/12/09, 56 y.o., male Today's Date: 06/02/2022   PT End of Session - 06/02/22 0947     Visit Number 1    Number of Visits 12    Date for PT Re-Evaluation 07/14/22    Authorization Type [employer] (30 vl PT, OT, ST)    Authorization - Visit Number 1    Authorization - Number of Visits 30    PT Start Time [phone]    PT Stop Time 1025    PT Time Calculation (min) 38 min    Activity Tolerance Patient tolerated treatment well    Behavior During Therapy [facility] for tasks assessed/performed             Past Medical History:  Diagnosis Date   Acid reflux    Past Surgical History:  Procedure Laterality Date   ESOPHAGOGASTRODUODENOSCOPY  2002   RMR: ulcerative reflux esophagitis, moderate sized hiatal hernia   HIP SURGERY Bilateral    as a child   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   Procedure: TOTAL HIP ARTHROPLASTY;  Surgeon: Vickki Hearing, MD;  Location: [facility];  Service: Orthopedics;  Laterality: Right;   WRIST SURGERY     Patient Active Problem List   Diagnosis Date Noted   S/P hip replacement, right 05/16/22 05/31/2022   Hypokalemia 05/24/2022   Anemia 05/24/2022   Hospital discharge follow-up 05/24/2022   Subclinical hyperthyroidism 06/22/2021   Vitamin D deficiency 06/22/2021   Prediabetes 06/22/2021   Mixed hyperlipidemia 06/22/2021   Encounter for general adult medical examination with abnormal findings 06/22/2021   Tobacco abuse 04/27/2021   HTN (hypertension) 04/27/2021   Peripheral polyneuropathy 04/27/2021   Gastroesophageal reflux disease without esophagitis 04/27/2021   Arthritis of right hip 04/27/2021   Alcohol abuse 12/19/2016   Substance abuse (HCC) 12/19/2016    PCP: Trena Platt MD  REFERRING PROVIDER: Vickki Hearing, MD   REFERRING DIAG: M16.7 (ICD-10-CM) - Other secondary osteoarthritis of right hip   THERAPY  DIAG:  Pain in right hip  Muscle weakness (generalized)  Other abnormalities of gait and mobility  Other symptoms and signs involving the musculoskeletal system  Rationale for Evaluation and Treatment Rehabilitation  ONSET DATE: 05/16/22  SUBJECTIVE:   SUBJECTIVE STATEMENT: Patient states s/p R THA on 05/16/22 with a direct lateral approach. He did home health therapy following. He has transitioned to using [facility]. His hip started bothering him 2-3 years ago. He normally does not use AD and is having trouble with normal ADL.   PERTINENT HISTORY: hypertension, right hip arthritis, peripheral neuropathy, s/p R THA on 05/16/22 with a direct lateral approach.    PAIN:  Are you having pain? Yes: NPRS scale: 4/10 Pain location: R hip Pain description: numbing, stinging Aggravating factors: bending too far, stretch Relieving factors: rest  PRECAUTIONS: Other: direct lateral hip  WEIGHT BEARING RESTRICTIONS No  FALLS:  Has patient fallen in last 6 months? 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  PATIENT GOALS get back to normal    OBJECTIVE:   PATIENT SURVEYS:  FOTO 56% function  COGNITION:  Overall cognitive status: Within functional limits for tasks assessed     SENSATION: WFL   POSTURE: No Significant postural limitations  PALPATION: Grossly tender lateral hip and  proximal thigh  LOWER EXTREMITY ROM:  decreased hip extension with ambulation  Active ROM Right eval Left eval  Hip flexion    Hip extension    Hip abduction    Hip adduction    Hip internal rotation    Hip external rotation    Knee flexion    Knee extension    Ankle dorsiflexion    Ankle plantarflexion    Ankle inversion    Ankle eversion     (Blank rows = not tested)  LOWER EXTREMITY MMT:  MMT Right eval  Left eval  Hip flexion 3+ 5  Hip extension    Hip abduction    Hip adduction    Hip internal rotation    Hip external rotation    Knee flexion 5 5  Knee extension 4+ 5  Ankle dorsiflexion 5 5  Ankle plantarflexion    Ankle inversion    Ankle eversion     (Blank rows = not tested)    FUNCTIONAL TESTS:  5 times sit to stand: 16.04 seconds with UE support, attempts without UE use but unable 2 minute walk test: 375 feet with SPC Transfers: labored, relies LLE, bilateral UE use Stairs: able to ascend with alternating with bilateral UE support, step too pattern on descend  GAIT: Distance walked: 375 feet Assistive device utilized: Single point cane Level of assistance: Modified independence Comments: 2 MWT, antalgic, decreased hip extension     TODAY'S TREATMENT: 06/02/22 Supine glute set 10 x 10 second holds Bridge 2x 10  Supine heel slides 2x 10  LAQ 10 x 5-10 second holds Standing alternating march 2x 10 bilateral with UE support  PATIENT EDUCATION:  Education details: Patient educated on exam findings, POC, scope of PT, HEP, and hip precautions. Person educated: Patient Education method: Explanation, Demonstration, and Handouts Education comprehension: verbalized understanding, returned demonstration, verbal cues required, and tactile cues required  HOME EXERCISE PROGRAM: Access Code: [email or web address]  Date: 06/02/2022 - Supine Gluteal Sets  - 3 x daily - 7 x weekly - 10 reps - 10 second hold - Supine Bridge  - 3 x daily - 7 x weekly - 2 sets - 10 reps - Supine Heel Slide  - 3 x daily - 7 x weekly - 2 sets - 10 reps - Seated Long Arc Quad  - 3 x daily - 7 x weekly - 10 reps - 5-10 second hold  ASSESSMENT:  CLINICAL IMPRESSION: Patient a 56 y.o. y.o. male who was seen today for physical therapy evaluation and treatment for s/p R THA on 05/16/22. Patient presents with pain limited deficits in R hip strength, ROM, endurance, activity tolerance, gait, balance, and functional  mobility with ADL. Patient is having to modify and restrict ADL as indicated by outcome measure score as well as subjective information and objective measures which is affecting overall participation. Patient will benefit from skilled physical therapy in order to improve function and reduce impairment.  OBJECTIVE IMPAIRMENTS Abnormal gait, decreased activity tolerance, decreased balance, decreased endurance, decreased mobility, difficulty walking, decreased ROM, decreased strength, increased muscle spasms, impaired flexibility, improper body mechanics, and pain.   ACTIVITY LIMITATIONS lifting, bending, standing, squatting, sleeping, stairs, transfers, locomotion level, and caring for others  PARTICIPATION LIMITATIONS: cleaning, laundry, driving, shopping, community activity, and yard work  PERSONAL FACTORS Fitness and 1-2 comorbidities: HLD, HTN, neuropathy  are also affecting patient's functional outcome.   REHAB POTENTIAL: Good  CLINICAL DECISION MAKING: Stable/uncomplicated  EVALUATION COMPLEXITY: Low   GOALS: Goals reviewed with  patient? Yes  SHORT TERM GOALS: Target date: 06/23/2022  Patient will be independent with HEP in order to improve functional outcomes. Baseline:  Goal status: INITIAL  2.  Patient will report at least 25% improvement in symptoms for improved quality of life. Baseline:  Goal status: INITIAL   LONG TERM GOALS: Target date: 07/14/2022  Patient will report at least 75% improvement in symptoms for improved quality of life. Baseline:  Goal status: INITIAL  2.  Patient will improve FOTO score by at least 20 points in order to indicate improved tolerance to activity. Baseline: 56% function Goal status: INITIAL  3.  Patient will be able to navigate stairs with reciprocal pattern without compensation or UE use in order to demonstrate improved LE strength. Baseline: see above Goal status: INITIAL  4.  Patient will be able to ambulate at least 500 feet in  with normal gait mechanics in order to demonstrate improved tolerance to activity. Baseline: 375 feet  Goal status: INITIAL  5. Patient will demonstrate grade of 5/5 MMT grade in all tested musculature as evidence of improved strength to assist with stair ambulation and gait.   Baseline: see MMT Goal status: INITIAL   PLAN: PT FREQUENCY: 2x/week  PT DURATION: 6 weeks  PLANNED INTERVENTIONS: Therapeutic exercises, Therapeutic activity, Neuromuscular re-education, Balance training, Gait training, Patient/Family education, Joint manipulation, Joint mobilization, Stair training, Orthotic/Fit training, DME instructions, Aquatic Therapy, Dry Needling, Electrical stimulation, Spinal manipulation, Spinal mobilization, Cryotherapy, Moist heat, Compression bandaging, scar mobilization, Splintting, Taping, Traction, Ultrasound, Ionotophoresis 4mg /ml Dexamethasone, and Manual therapy  PLAN FOR NEXT SESSION: direct lateral hip precautions, improve strength and mobility and progress as able   , PT 06/02/2022, 10:21 AM

## 2022-06-07 ENCOUNTER — Ambulatory Visit (HOSPITAL_COMMUNITY): Payer: 59 | Attending: Orthopedic Surgery | Admitting: Physical Therapy

## 2022-06-07 DIAGNOSIS — R2689 Other abnormalities of gait and mobility: Secondary | ICD-10-CM | POA: Diagnosis present

## 2022-06-07 DIAGNOSIS — R29898 Other symptoms and signs involving the musculoskeletal system: Secondary | ICD-10-CM | POA: Insufficient documentation

## 2022-06-07 DIAGNOSIS — M6281 Muscle weakness (generalized): Secondary | ICD-10-CM | POA: Diagnosis present

## 2022-06-07 DIAGNOSIS — M25551 Pain in right hip: Secondary | ICD-10-CM | POA: Insufficient documentation

## 2022-06-07 NOTE — Therapy (Signed)
OUTPATIENT PHYSICAL THERAPY TREATMENT  Patient Name: David Knox MRN: [id number] DOB:08-Feb-1966, 56 y.o., male Today's Date: 06/07/2022   PT End of Session - 06/07/22 0828     Visit Number 2    Number of Visits 12    Date for PT Re-Evaluation 07/14/22    Authorization Type [employer] (30 vl PT, OT, ST)    Authorization - Visit Number 2    Authorization - Number of Visits 30    PT Start Time 0818    PT Stop Time 0856    PT Time Calculation (min) 38 min    Activity Tolerance Patient tolerated treatment well    Behavior During Therapy [facility] for tasks assessed/performed             Past Medical History:  Diagnosis Date   Acid reflux    Past Surgical History:  Procedure Laterality Date   ESOPHAGOGASTRODUODENOSCOPY  2002   RMR: ulcerative reflux esophagitis, moderate sized hiatal hernia   HIP SURGERY Bilateral    as a child   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   Procedure: TOTAL HIP ARTHROPLASTY;  Surgeon: Carole Civil, MD;  Location: [facility];  Service: Orthopedics;  Laterality: Right;   WRIST SURGERY     Patient Active Problem List   Diagnosis Date Noted   S/P hip replacement, right 05/16/22 05/31/2022   Hypokalemia 05/24/2022   Anemia 05/24/2022   Hospital discharge follow-up 05/24/2022   Subclinical hyperthyroidism 06/22/2021   Vitamin D deficiency 06/22/2021   Prediabetes 06/22/2021   Mixed hyperlipidemia 06/22/2021   Encounter for general adult medical examination with abnormal findings 06/22/2021   Tobacco abuse 04/27/2021   HTN (hypertension) 04/27/2021   Peripheral polyneuropathy 04/27/2021   Gastroesophageal reflux disease without esophagitis 04/27/2021   Arthritis of right hip 04/27/2021   Alcohol abuse 12/19/2016   Substance abuse ([place]) 12/19/2016    PCP: Ihor Dow MD  REFERRING PROVIDER: Carole Civil, MD   REFERRING DIAG: M16.7 (ICD-10-CM) - Other secondary osteoarthritis of right hip   THERAPY DIAG:  Pain in right  hip  Muscle weakness (generalized)  Rationale for Evaluation and Treatment Rehabilitation  ONSET DATE: 05/16/22  SUBJECTIVE:   SUBJECTIVE STATEMENT: Patient states Rt lateral hip soreness with pain of 3/10.  Reports compliance with HEP.  Continues to ambulate with antalgia using SPC.   PERTINENT HISTORY: hypertension, right hip arthritis, peripheral neuropathy, s/p R THA on 05/16/22 with a direct lateral approach.    PAIN:  Are you having pain? Yes: NPRS scale: 3/10 Pain location: R hip Pain description: soreness Aggravating factors: bending too far, stretch Relieving factors: rest  PRECAUTIONS: Other: direct lateral hip  WEIGHT BEARING RESTRICTIONS No  FALLS:  Has patient fallen in last 6 months? 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  PATIENT GOALS get back to normal    OBJECTIVE:   PATIENT SURVEYS:  FOTO 56% function  COGNITION:  Overall cognitive status: Within functional limits for tasks assessed     SENSATION: WFL   POSTURE: No Significant postural limitations  PALPATION: Grossly tender lateral hip and proximal thigh  LOWER EXTREMITY ROM:  decreased hip extension with ambulation  Active ROM Right eval Left eval  Hip flexion    Hip extension    Hip abduction    Hip adduction    Hip internal  rotation    Hip external rotation    Knee flexion    Knee extension    Ankle dorsiflexion    Ankle plantarflexion    Ankle inversion    Ankle eversion     (Blank rows = not tested)  LOWER EXTREMITY MMT:  MMT Right eval Left eval  Hip flexion 3+ 5  Hip extension    Hip abduction    Hip adduction    Hip internal rotation    Hip external rotation    Knee flexion 5 5  Knee extension 4+ 5  Ankle dorsiflexion 5 5  Ankle plantarflexion    Ankle inversion    Ankle eversion      (Blank rows = not tested)    FUNCTIONAL TESTS:  5 times sit to stand: 16.04 seconds with UE support, attempts without UE use but unable 2 minute walk test: 375 feet with SPC Transfers: labored, relies LLE, bilateral UE use Stairs: able to ascend with alternating with bilateral UE support, step too pattern on descend  GAIT: Distance walked: 375 feet Assistive device utilized: Single point cane Level of assistance: Modified independence Comments: 2 MWT, antalgic, decreased hip extension     TODAY'S TREATMENT: 06/07/22 Glute set 10X10 Bridge 20X Heelslides 20X Rt SLR Rt LE 5X Seated:  LAQ 10X5" Rt LE  Sit to stands no UE 10X STanding:  Alternating march with 1 UE assist 20X Nustep 5 minutes Level 3 LE's only seat at 12 Scar massage instruction   06/02/22 Supine glute set 10 x 10 second holds Bridge 2x 10  Supine heel slides 2x 10  LAQ 10 x 5-10 second holds Standing alternating march 2x 10 bilateral with UE support  PATIENT EDUCATION:  Education details: 06/07/22:  reviewed goals, demonstrated hip kit, discussed healthcare plan, scar massage instruction; eval: Patient educated on exam findings, POC, scope of PT, HEP, and hip precautions. Person educated: Patient Education method: Explanation, Demonstration, and Handouts Education comprehension: verbalized understanding, returned demonstration, verbal cues required, and tactile cues required  HOME EXERCISE PROGRAM: Access Code: [email or web address]  Date: 06/02/2022 - Supine Gluteal Sets  - 3 x daily - 7 x weekly - 10 reps - 10 second hold - Supine Bridge  - 3 x daily - 7 x weekly - 2 sets - 10 reps - Supine Heel Slide  - 3 x daily - 7 x weekly - 2 sets - 10 reps - Seated Long Arc Quad  - 3 x daily - 7 x weekly - 10 reps - 5-10 second hold  ASSESSMENT:  CLINICAL IMPRESSION: Reviewed goals, HEP and POC moving forward.  Demonstrated hip kit and told where he could purchase this as he is having difficulty maintaining hip  precautions for ADLS.  Answered all questions regarding reasoning for exercises and why he needs to avoid certain motions.  Continued with established therex with addition of SLR, which was difficult due to weakness, only able to complete 5 repetitions.  Sit to stands began to further work on [employer] and quads.  Instructed with scar massage as very tight adhesions and scar tissue along lateral hip.   Patient will benefit from skilled physical therapy in order to improve function and reduce impairment.  OBJECTIVE IMPAIRMENTS Abnormal gait, decreased activity tolerance, decreased balance, decreased endurance, decreased mobility, difficulty walking, decreased ROM, decreased strength, increased muscle spasms, impaired flexibility, improper body mechanics, and pain.   ACTIVITY LIMITATIONS lifting, bending, standing, squatting, sleeping, stairs, transfers, locomotion level, and caring for others  PARTICIPATION LIMITATIONS:  cleaning, laundry, driving, shopping, community activity, and yard work  PERSONAL FACTORS Fitness and 1-2 comorbidities: HLD, HTN, neuropathy  are also affecting patient's functional outcome.   REHAB POTENTIAL: Good  CLINICAL DECISION MAKING: Stable/uncomplicated  EVALUATION COMPLEXITY: Low   GOALS: Goals reviewed with patient? No  SHORT TERM GOALS: Target date: 06/23/2022  Patient will be independent with HEP in order to improve functional outcomes. Baseline:  Goal status: IN PROGRESS  2.  Patient will report at least 25% improvement in symptoms for improved quality of life. Baseline:  Goal status: IN PROGRESS   LONG TERM GOALS: Target date: 07/14/2022  Patient will report at least 75% improvement in symptoms for improved quality of life. Baseline:  Goal status: IN PROGRESS  2.  Patient will improve FOTO score by at least 20 points in order to indicate improved tolerance to activity. Baseline: 56% function Goal status: IN PROGRESS  3.  Patient will be able to  navigate stairs with reciprocal pattern without compensation or UE use in order to demonstrate improved LE strength. Baseline: see above Goal status: IN PROGRESS  4.  Patient will be able to ambulate at least 500 feet in 2MWT with normal gait mechanics in order to demonstrate improved tolerance to activity. Baseline: 375 feet  Goal status: IN PROGRESS  5. Patient will demonstrate grade of 5/5 MMT grade in all tested musculature as evidence of improved strength to assist with stair ambulation and gait.   Baseline: see MMT Goal status: IN PROGRESS   PLAN: PT FREQUENCY: 2x/week  PT DURATION: 6 weeks  PLANNED INTERVENTIONS: Therapeutic exercises, Therapeutic activity, Neuromuscular re-education, Balance training, Gait training, Patient/Family education, Joint manipulation, Joint mobilization, Stair training, Orthotic/Fit training, DME instructions, Aquatic Therapy, Dry Needling, Electrical stimulation, Spinal manipulation, Spinal mobilization, Cryotherapy, Moist heat, Compression bandaging, scar mobilization, Splintting, Taping, Traction, Ultrasound, Ionotophoresis 40m/ml Dexamethasone, and Manual therapy  PLAN FOR NEXT SESSION: direct lateral hip precautions, no hip abduction until 8/28,  improve strength and mobility and progress as able   FTeena Irani PTA 06/07/2022, 8:29 AM ATeena Irani PTA/CLT CMarquette HeightsPh: [phone]

## 2022-06-08 ENCOUNTER — Ambulatory Visit (HOSPITAL_COMMUNITY): Payer: 59 | Admitting: Physical Therapy

## 2022-06-08 DIAGNOSIS — M6281 Muscle weakness (generalized): Secondary | ICD-10-CM

## 2022-06-08 DIAGNOSIS — R29898 Other symptoms and signs involving the musculoskeletal system: Secondary | ICD-10-CM

## 2022-06-08 DIAGNOSIS — M25551 Pain in right hip: Secondary | ICD-10-CM

## 2022-06-08 DIAGNOSIS — R2689 Other abnormalities of gait and mobility: Secondary | ICD-10-CM

## 2022-06-08 NOTE — Therapy (Signed)
OUTPATIENT PHYSICAL THERAPY TREATMENT  Patient Name: David Knox MRN: [id number] DOB:10/07/1966, 56 y.o., male Today's Date: 06/08/2022   PT End of Session - 06/08/22 1008     Visit Number 3    Number of Visits 12    Date for PT Re-Evaluation 07/14/22    Authorization Type [employer] (30 vl PT, OT, ST)    Authorization - Visit Number 3    Authorization - Number of Visits 30    PT Start Time 8346    PT Stop Time 1055    PT Time Calculation (min) 40 min    Activity Tolerance Patient tolerated treatment well    Behavior During Therapy [facility] for tasks assessed/performed             Past Medical History:  Diagnosis Date   Acid reflux    Past Surgical History:  Procedure Laterality Date   ESOPHAGOGASTRODUODENOSCOPY  2002   RMR: ulcerative reflux esophagitis, moderate sized hiatal hernia   HIP SURGERY Bilateral    as a child   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   Procedure: TOTAL HIP ARTHROPLASTY;  Surgeon: Carole Civil, MD;  Location: [facility];  Service: Orthopedics;  Laterality: Right;   WRIST SURGERY     Patient Active Problem List   Diagnosis Date Noted   S/P hip replacement, right 05/16/22 05/31/2022   Hypokalemia 05/24/2022   Anemia 05/24/2022   Hospital discharge follow-up 05/24/2022   Subclinical hyperthyroidism 06/22/2021   Vitamin D deficiency 06/22/2021   Prediabetes 06/22/2021   Mixed hyperlipidemia 06/22/2021   Encounter for general adult medical examination with abnormal findings 06/22/2021   Tobacco abuse 04/27/2021   HTN (hypertension) 04/27/2021   Peripheral polyneuropathy 04/27/2021   Gastroesophageal reflux disease without esophagitis 04/27/2021   Arthritis of right hip 04/27/2021   Alcohol abuse 12/19/2016   Substance abuse ([place]) 12/19/2016    PCP: Ihor Dow MD  REFERRING PROVIDER: Carole Civil, MD   REFERRING DIAG: M16.7 (ICD-10-CM) - Other secondary osteoarthritis of right hip   THERAPY DIAG:  Pain in right  hip  Muscle weakness (generalized)  Other abnormalities of gait and mobility  Other symptoms and signs involving the musculoskeletal system  Rationale for Evaluation and Treatment Rehabilitation  ONSET DATE: 05/16/22  SUBJECTIVE:   SUBJECTIVE STATEMENT: Patient states he is picking up his hip kit from [place] when he leaves here.  Reports a little hip soreness but no pain.   PERTINENT HISTORY: hypertension, right hip arthritis, peripheral neuropathy, s/p R THA on 05/16/22 with a direct lateral approach.    PAIN:  Are you having pain? No  PRECAUTIONS: Other: direct lateral hip  WEIGHT BEARING RESTRICTIONS No  FALLS:  Has patient fallen in last 6 months? 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  PATIENT GOALS get back to normal    OBJECTIVE:   PATIENT SURVEYS:  FOTO 56% function  COGNITION:  Overall cognitive status: Within functional limits for tasks assessed     SENSATION: WFL   POSTURE: No Significant postural limitations  PALPATION: Grossly tender lateral hip and proximal thigh  LOWER EXTREMITY ROM:  decreased hip extension with ambulation  LOWER EXTREMITY MMT:  MMT Right eval Left eval  Hip flexion 3+ 5  Hip extension    Hip abduction    Hip adduction  Hip internal rotation    Hip external rotation    Knee flexion 5 5  Knee extension 4+ 5  Ankle dorsiflexion 5 5  Ankle plantarflexion    Ankle inversion    Ankle eversion     (Blank rows = not tested)    FUNCTIONAL TESTS:  5 times sit to stand: 16.04 seconds with UE support, attempts without UE use but unable 2 minute walk test: 375 feet with SPC Transfers: labored, relies LLE, bilateral UE use Stairs: able to ascend with alternating with bilateral UE support, step too pattern on  descend  GAIT: Distance walked: 375 feet Assistive device utilized: Single point cane Level of assistance: Modified independence Comments: 2 MWT, antalgic, decreased hip extension     TODAY'S TREATMENT: 06/08/22 Nustep 5 minutes Level 3 LE's only seat at 12 Standing:  Alternating march with 1 UE assist 20X  Heelraises 20X  Toeraises 20X  Squats 10X in good form  4" forward step ups Rt LE leading 1 UE assist 10X  4" lateral step ups Rt LE with eccentric lowering 10X  4" forward lunge without UE assist 10X each LE Seated:  LAQ 10X5" Rt LE  Sit to stands no UE standard chair with black foam in seat 10X eccentric lowering Supine:Bridge 20X  Heelslides 20X Rt  SLR Rt LE 5X 2   06/07/22 Glute set 10X10 Bridge 20X Heelslides 20X Rt SLR Rt LE 5X Seated:  LAQ 10X5" Rt LE  Sit to stands no UE 10X STanding:  Alternating march with 1 UE assist 20X Nustep 5 minutes Level 3 LE's only seat at 12 Scar massage instruction   06/02/22 Supine glute set 10 x 10 second holds Bridge 2x 10  Supine heel slides 2x 10  LAQ 10 x 5-10 second holds Standing alternating march 2x 10 bilateral with UE support  PATIENT EDUCATION:  Education details: 06/07/22:  reviewed goals, demonstrated hip kit, discussed healthcare plan, scar massage instruction; eval: Patient educated on exam findings, POC, scope of PT, HEP, and hip precautions. Person educated: Patient Education method: Explanation, Demonstration, and Handouts Education comprehension: verbalized understanding, returned demonstration, verbal cues required, and tactile cues required  HOME EXERCISE PROGRAM: Access Code: [email or web address]  Date: 06/02/2022 - Supine Gluteal Sets  - 3 x daily - 7 x weekly - 10 reps - 10 second hold - Supine Bridge  - 3 x daily - 7 x weekly - 2 sets - 10 reps - Supine Heel Slide  - 3 x daily - 7 x weekly - 2 sets - 10 reps - Seated Long Arc Quad  - 3 x daily - 7 x weekly - 10 reps - 5-10 second hold  ASSESSMENT:  CLINICAL  IMPRESSION: Continued with established plan of care.  Pt unable to stand from standard height chair without UE's without 2" foam lift in chair.  Began LE functional strengthening with 4" step, heel/toes and lunges.  Pt able to complete all these with cues for posturing and eccentric control.  Ability to complete Rt SLR improved, however still difficult and unable to complete more than 5 reps.  Pt is completing self scar massage to lateral hip.   Patient will benefit from skilled physical therapy in order to improve function and reduce impairment.  OBJECTIVE IMPAIRMENTS Abnormal gait, decreased activity tolerance, decreased balance, decreased endurance, decreased mobility, difficulty walking, decreased ROM, decreased strength, increased muscle spasms, impaired flexibility, improper body mechanics, and pain.   ACTIVITY LIMITATIONS lifting, bending, standing, squatting, sleeping, stairs, transfers, locomotion  level, and caring for others  PARTICIPATION LIMITATIONS: cleaning, laundry, driving, shopping, community activity, and yard work  PERSONAL FACTORS Fitness and 1-2 comorbidities: HLD, HTN, neuropathy  are also affecting patient's functional outcome.   REHAB POTENTIAL: Good  CLINICAL DECISION MAKING: Stable/uncomplicated  EVALUATION COMPLEXITY: Low   GOALS: Goals reviewed with patient? No  SHORT TERM GOALS: Target date: 06/23/2022  Patient will be independent with HEP in order to improve functional outcomes. Baseline:  Goal status: IN PROGRESS  2.  Patient will report at least 25% improvement in symptoms for improved quality of life. Baseline:  Goal status: IN PROGRESS   LONG TERM GOALS: Target date: 07/14/2022  Patient will report at least 75% improvement in symptoms for improved quality of life. Baseline:  Goal status: IN PROGRESS  2.  Patient will improve FOTO score by at least 20 points in order to indicate improved tolerance to activity. Baseline: 56% function Goal status:  IN PROGRESS  3.  Patient will be able to navigate stairs with reciprocal pattern without compensation or UE use in order to demonstrate improved LE strength. Baseline: see above Goal status: IN PROGRESS  4.  Patient will be able to ambulate at least 500 feet in 2MWT with normal gait mechanics in order to demonstrate improved tolerance to activity. Baseline: 375 feet  Goal status: IN PROGRESS  5. Patient will demonstrate grade of 5/5 MMT grade in all tested musculature as evidence of improved strength to assist with stair ambulation and gait.   Baseline: see MMT Goal status: IN PROGRESS   PLAN: PT FREQUENCY: 2x/week  PT DURATION: 6 weeks  PLANNED INTERVENTIONS: Therapeutic exercises, Therapeutic activity, Neuromuscular re-education, Balance training, Gait training, Patient/Family education, Joint manipulation, Joint mobilization, Stair training, Orthotic/Fit training, DME instructions, Aquatic Therapy, Dry Needling, Electrical stimulation, Spinal manipulation, Spinal mobilization, Cryotherapy, Moist heat, Compression bandaging, scar mobilization, Splintting, Taping, Traction, Ultrasound, Ionotophoresis 36m/ml Dexamethasone, and Manual therapy  PLAN FOR NEXT SESSION: direct lateral hip precautions, no hip abduction until 8/28,  improve strength and mobility and progress as able   FTeena Irani PTA 06/08/2022, 10:08 AM ATeena Irani PTA/CLT CArcherPh: [phone]

## 2022-06-09 ENCOUNTER — Other Ambulatory Visit: Payer: Self-pay | Admitting: Orthopedic Surgery

## 2022-06-09 ENCOUNTER — Other Ambulatory Visit: Payer: Self-pay | Admitting: Internal Medicine

## 2022-06-09 NOTE — Telephone Encounter (Signed)
Please advise 

## 2022-06-09 NOTE — Telephone Encounter (Signed)
Spoke with pt he states that he is currently taking both losartan and telmisartan

## 2022-06-12 ENCOUNTER — Ambulatory Visit (HOSPITAL_COMMUNITY): Payer: 59

## 2022-06-12 DIAGNOSIS — R29898 Other symptoms and signs involving the musculoskeletal system: Secondary | ICD-10-CM

## 2022-06-12 DIAGNOSIS — M25551 Pain in right hip: Secondary | ICD-10-CM

## 2022-06-12 DIAGNOSIS — R2689 Other abnormalities of gait and mobility: Secondary | ICD-10-CM

## 2022-06-12 DIAGNOSIS — M6281 Muscle weakness (generalized): Secondary | ICD-10-CM

## 2022-06-12 NOTE — Therapy (Signed)
OUTPATIENT PHYSICAL THERAPY TREATMENT  Patient Name: David Knox MRN: [id number] DOB:08/28/1966, 56 y.o., male Today's Date: 06/12/2022   PT End of Session - 06/12/22 0835     Visit Number 4    Number of Visits 12    Date for PT Re-Evaluation 07/14/22    Authorization Type [employer] (30 vl PT, OT, ST)    Authorization - Visit Number 4    Authorization - Number of Visits 30    PT Start Time 0840    PT Stop Time 0920    PT Time Calculation (min) 40 min    Activity Tolerance Patient tolerated treatment well    Behavior During Therapy [facility] for tasks assessed/performed              Past Medical History:  Diagnosis Date   Acid reflux    Past Surgical History:  Procedure Laterality Date   ESOPHAGOGASTRODUODENOSCOPY  2002   RMR: ulcerative reflux esophagitis, moderate sized hiatal hernia   HIP SURGERY Bilateral    as a child   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   Procedure: TOTAL HIP ARTHROPLASTY;  Surgeon: Carole Civil, MD;  Location: [facility];  Service: Orthopedics;  Laterality: Right;   WRIST SURGERY     Patient Active Problem List   Diagnosis Date Noted   S/P hip replacement, right 05/16/22 05/31/2022   Hypokalemia 05/24/2022   Anemia 05/24/2022   Hospital discharge follow-up 05/24/2022   Subclinical hyperthyroidism 06/22/2021   Vitamin D deficiency 06/22/2021   Prediabetes 06/22/2021   Mixed hyperlipidemia 06/22/2021   Encounter for general adult medical examination with abnormal findings 06/22/2021   Tobacco abuse 04/27/2021   HTN (hypertension) 04/27/2021   Peripheral polyneuropathy 04/27/2021   Gastroesophageal reflux disease without esophagitis 04/27/2021   Arthritis of right hip 04/27/2021   Alcohol abuse 12/19/2016   Substance abuse ([place]) 12/19/2016    PCP: Ihor Dow MD  REFERRING PROVIDER: Carole Civil, MD   REFERRING DIAG: M16.7 (ICD-10-CM) - Other secondary osteoarthritis of right hip   THERAPY DIAG:  Pain in  right hip  Muscle weakness (generalized)  Other abnormalities of gait and mobility  Other symptoms and signs involving the musculoskeletal system  Rationale for Evaluation and Treatment Rehabilitation  ONSET DATE: 05/16/22  SUBJECTIVE:   SUBJECTIVE STATEMENT: No soreness Did not pick up hip kit but is able to don socks and shoes ok.  PERTINENT HISTORY: hypertension, right hip arthritis, peripheral neuropathy, s/p R THA on 05/16/22 with a direct lateral approach.    PAIN:  Are you having pain? Yes: NPRS scale: 3/10 Pain location: right hip Pain description: tight, sore Aggravating factors: stiff in the mornings Relieving factors: movement  PRECAUTIONS: Other: direct lateral hip  WEIGHT BEARING RESTRICTIONS No  FALLS:  Has patient fallen in last 6 months? 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  PATIENT GOALS get back to normal    OBJECTIVE:   PATIENT SURVEYS:  FOTO 56% function  COGNITION:  Overall cognitive status: Within functional limits for tasks assessed     SENSATION: WFL   POSTURE: No Significant postural limitations  PALPATION: Grossly tender lateral hip and proximal thigh  LOWER EXTREMITY ROM:  decreased hip extension with ambulation  LOWER EXTREMITY MMT:  MMT Right eval Left eval  Hip flexion 3+ 5  Hip extension  Hip abduction    Hip adduction    Hip internal rotation    Hip external rotation    Knee flexion 5 5  Knee extension 4+ 5  Ankle dorsiflexion 5 5  Ankle plantarflexion    Ankle inversion    Ankle eversion     (Blank rows = not tested)    FUNCTIONAL TESTS:  5 times sit to stand: 16.04 seconds with UE support, attempts without UE use but unable 2 minute walk test: 375 feet with SPC Transfers: labored, relies LLE, bilateral UE use Stairs: able to  ascend with alternating with bilateral UE support, step too pattern on descend  GAIT: Distance walked: 375 feet Assistive device utilized: Single point cane Level of assistance: Modified independence Comments: 2 MWT, antalgic, decreased hip extension     TODAY'S TREATMENT: 06/12/22 Nustep 5 minutes seat 12 level 3 working up to level 4  Standing: Heel/toe raises x 20 Slant board 5 x 20" Marching 2 x 20  6" step ups 2 x 10 TKE's green x 20 Sit to stand  from chair with foam in seat no UE assist Foam stand NBOS x 30" eyes closed x 30"   Seated: LAQs 2# 2 X 10 Hip adduction with ball 5" hold 2 x 10       06/08/22 Nustep 5 minutes Level 3 LE's only seat at 12 Standing:  Alternating march with 1 UE assist 20X  Heelraises 20X  Toeraises 20X  Squats 10X in good form  4" forward step ups Rt LE leading 1 UE assist 10X  4" lateral step ups Rt LE with eccentric lowering 10X  4" forward lunge without UE assist 10X each LE Seated:  LAQ 10X5" Rt LE  Sit to stands no UE standard chair with black foam in seat 10X eccentric lowering Supine:Bridge 20X  Heelslides 20X Rt  SLR Rt LE 5X 2   06/07/22 Glute set 10X10 Bridge 20X Heelslides 20X Rt SLR Rt LE 5X Seated:  LAQ 10X5" Rt LE  Sit to stands no UE 10X STanding:  Alternating march with 1 UE assist 20X Nustep 5 minutes Level 3 LE's only seat at 12 Scar massage instruction   06/02/22 Supine glute set 10 x 10 second holds Bridge 2x 10  Supine heel slides 2x 10  LAQ 10 x 5-10 second holds Standing alternating march 2x 10 bilateral with UE support  PATIENT EDUCATION:  Education details: 06/07/22:  reviewed goals, demonstrated hip kit, discussed healthcare plan, scar massage instruction; eval: Patient educated on exam findings, POC, scope of PT, HEP, and hip precautions. Person educated: Patient Education method: Explanation, Demonstration, and Handouts Education comprehension: verbalized understanding, returned demonstration,  verbal cues required, and tactile cues required  HOME EXERCISE PROGRAM: Access Code: [email or web address]  Date: 06/02/2022 - Supine Gluteal Sets  - 3 x daily - 7 x weekly - 10 reps - 10 second hold - Supine Bridge  - 3 x daily - 7 x weekly - 2 sets - 10 reps - Supine Heel Slide  - 3 x daily - 7 x weekly - 2 sets - 10 reps - Seated Long Arc Quad  - 3 x daily - 7 x weekly - 10 reps - 5-10 second hold  ASSESSMENT:  CLINICAL IMPRESSION: Today's session continued to focus on lower extremity strengthening and balance. Still painful with attempt to stand from chair without UE assist; able to perform with foam in seat.  Increased height of step up box; added TKE's for strengthening. Needed  CGA/min A for safety with standing on foam with NBOS with eyes closed. Patient will benefit from skilled physical therapy in order to improve function and reduce impairment.  OBJECTIVE IMPAIRMENTS Abnormal gait, decreased activity tolerance, decreased balance, decreased endurance, decreased mobility, difficulty walking, decreased ROM, decreased strength, increased muscle spasms, impaired flexibility, improper body mechanics, and pain.   ACTIVITY LIMITATIONS lifting, bending, standing, squatting, sleeping, stairs, transfers, locomotion level, and caring for others  PARTICIPATION LIMITATIONS: cleaning, laundry, driving, shopping, community activity, and yard work  [place] and 1-2 comorbidities: HLD, HTN, neuropathy  are also affecting patient's functional outcome.   REHAB POTENTIAL: Good  CLINICAL DECISION MAKING: Stable/uncomplicated  EVALUATION COMPLEXITY: Low   GOALS: Goals reviewed with patient? No  SHORT TERM GOALS: Target date: 06/23/2022  Patient will be independent with HEP in order to improve functional outcomes. Baseline:  Goal status: IN PROGRESS  2.  Patient will report at least 25% improvement in symptoms for improved quality of life. Baseline:  Goal status: IN PROGRESS   LONG  TERM GOALS: Target date: 07/14/2022  Patient will report at least 75% improvement in symptoms for improved quality of life. Baseline:  Goal status: IN PROGRESS  2.  Patient will improve FOTO score by at least 20 points in order to indicate improved tolerance to activity. Baseline: 56% function Goal status: IN PROGRESS  3.  Patient will be able to navigate stairs with reciprocal pattern without compensation or UE use in order to demonstrate improved LE strength. Baseline: see above Goal status: IN PROGRESS  4.  Patient will be able to ambulate at least 500 feet in 2MWT with normal gait mechanics in order to demonstrate improved tolerance to activity. Baseline: 375 feet  Goal status: IN PROGRESS  5. Patient will demonstrate grade of 5/5 MMT grade in all tested musculature as evidence of improved strength to assist with stair ambulation and gait.   Baseline: see MMT Goal status: IN PROGRESS   PLAN: PT FREQUENCY: 2x/week  PT DURATION: 6 weeks  PLANNED INTERVENTIONS: Therapeutic exercises, Therapeutic activity, Neuromuscular re-education, Balance training, Gait training, Patient/Family education, Joint manipulation, Joint mobilization, Stair training, Orthotic/Fit training, DME instructions, Aquatic Therapy, Dry Needling, Electrical stimulation, Spinal manipulation, Spinal mobilization, Cryotherapy, Moist heat, Compression bandaging, scar mobilization, Splintting, Taping, Traction, Ultrasound, Ionotophoresis 58m/ml Dexamethasone, and Manual therapy  PLAN FOR NEXT SESSION: direct lateral hip precautions, no hip abduction until 8/28,  improve strength and mobility and progress as able   9:25 AM, 06/12/22 Khila Papp Small Ashana Tullo MPT [facility] [place] #[phone]:[phone]

## 2022-06-14 ENCOUNTER — Ambulatory Visit (HOSPITAL_COMMUNITY): Payer: 59

## 2022-06-14 ENCOUNTER — Telehealth: Payer: Self-pay

## 2022-06-14 DIAGNOSIS — M25551 Pain in right hip: Secondary | ICD-10-CM | POA: Diagnosis not present

## 2022-06-14 DIAGNOSIS — R29898 Other symptoms and signs involving the musculoskeletal system: Secondary | ICD-10-CM

## 2022-06-14 DIAGNOSIS — M6281 Muscle weakness (generalized): Secondary | ICD-10-CM

## 2022-06-14 DIAGNOSIS — R2689 Other abnormalities of gait and mobility: Secondary | ICD-10-CM

## 2022-06-14 NOTE — Therapy (Addendum)
OUTPATIENT PHYSICAL THERAPY TREATMENT  Patient Name: David Knox MRN: [id number] DOB:05-08-1966, 56 y.o., male Today's Date: 06/14/2022   PT End of Session - 06/14/22 0806     Visit Number 5    Number of Visits 12    Date for PT Re-Evaluation 07/14/22    Authorization Type [employer] (30 vl PT, OT, ST)    Authorization - Visit Number 5    Authorization - Number of Visits 30    PT Start Time [phone]    PT Stop Time 0900    PT Time Calculation (min) 48 min    Activity Tolerance Patient tolerated treatment well    Behavior During Therapy [facility] for tasks assessed/performed               Past Medical History:  Diagnosis Date   Acid reflux    Past Surgical History:  Procedure Laterality Date   ESOPHAGOGASTRODUODENOSCOPY  2002   RMR: ulcerative reflux esophagitis, moderate sized hiatal hernia   HIP SURGERY Bilateral    as a child   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   Procedure: TOTAL HIP ARTHROPLASTY;  Surgeon: Carole Civil, MD;  Location: [facility];  Service: Orthopedics;  Laterality: Right;   WRIST SURGERY     Patient Active Problem List   Diagnosis Date Noted   S/P hip replacement, right 05/16/22 05/31/2022   Hypokalemia 05/24/2022   Anemia 05/24/2022   Hospital discharge follow-up 05/24/2022   Subclinical hyperthyroidism 06/22/2021   Vitamin D deficiency 06/22/2021   Prediabetes 06/22/2021   Mixed hyperlipidemia 06/22/2021   Encounter for general adult medical examination with abnormal findings 06/22/2021   Tobacco abuse 04/27/2021   HTN (hypertension) 04/27/2021   Peripheral polyneuropathy 04/27/2021   Gastroesophageal reflux disease without esophagitis 04/27/2021   Arthritis of right hip 04/27/2021   Alcohol abuse 12/19/2016   Substance abuse ([place]) 12/19/2016    PCP: Ihor Dow MD  REFERRING PROVIDER: Carole Civil, MD   REFERRING DIAG: M16.7 (ICD-10-CM) - Other secondary osteoarthritis of right hip   THERAPY DIAG:  Pain in  right hip  Muscle weakness (generalized)  Other abnormalities of gait and mobility  Other symptoms and signs involving the musculoskeletal system  Rationale for Evaluation and Treatment Rehabilitation  ONSET DATE: 05/16/22  SUBJECTIVE:   SUBJECTIVE STATEMENT: Patient reports he had some spasms in his hip and leg last night; no other new issues. No trouble with HEP; returns to the MD 07/01/22  PERTINENT HISTORY: hypertension, right hip arthritis, peripheral neuropathy, s/p R THA on 05/16/22 with a direct lateral approach.    PAIN:  Are you having pain? Yes: NPRS scale: 3/10 Pain location: right hip Pain description: tight, sore Aggravating factors: stiff in the mornings Relieving factors: movement  PRECAUTIONS: Other: direct lateral hip  WEIGHT BEARING RESTRICTIONS No  FALLS:  Has patient fallen in last 6 months? 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  PATIENT GOALS get back to normal    OBJECTIVE:   PATIENT SURVEYS:  FOTO 56% function  COGNITION:  Overall cognitive status: Within functional limits for tasks assessed     SENSATION: WFL   POSTURE: No Significant postural limitations  PALPATION: Grossly tender lateral hip and proximal thigh  LOWER EXTREMITY ROM:  decreased hip extension with ambulation  LOWER EXTREMITY MMT:  MMT Right eval Left  eval  Hip flexion 3+ 5  Hip extension    Hip abduction    Hip adduction    Hip internal rotation    Hip external rotation    Knee flexion 5 5  Knee extension 4+ 5  Ankle dorsiflexion 5 5  Ankle plantarflexion    Ankle inversion    Ankle eversion     (Blank rows = not tested)    FUNCTIONAL TESTS:  5 times sit to stand: 16.04 seconds with UE support, attempts without UE use but unable 2 minute walk test: 375 feet with  SPC Transfers: labored, relies LLE, bilateral UE use Stairs: able to ascend with alternating with bilateral UE support, step too pattern on descend  GAIT: Distance walked: 375 feet Assistive device utilized: Single point cane Level of assistance: Modified independence Comments: 2 MWT, antalgic, decreased hip extension     TODAY'S TREATMENT: 06/14/22 Nustep 5 minutes seat 12 level 4 dynamic warm up  Gait training; Walk outdoors parking lot with SPC x 8 minutes up and down curb, ramp and uneven surfaces  Gait training with SPC x 1 lap in gym focus on form  Standing: Heel/toe raises x 20 no UE assist Slant board 5 x 20" Marching  x 20 no UE assist 8" step ups x 12 focus on control TKE's green x 30 Tandem stance on foam beam 2 x 30" each Mini squats 2 x 10 Sit to stand no UE assist from chair x 5   Seated: LAQs rt 5# 2 X 10 (sitting on foam for improved foot clearance)   06/12/22 Nustep 5 minutes seat 12 level 3 working up to level 4  Standing: Heel/toe raises x 20 Slant board 5 x 20" Marching 2 x 20  6" step ups 2 x 10 TKE's green x 20 Sit to stand  from chair with foam in seat no UE assist Foam stand NBOS x 30" eyes closed x 30"   Seated: LAQs 2# 2 X 10 Hip adduction with ball 5" hold 2 x 10       06/08/22 Nustep 5 minutes Level 3 LE's only seat at 12 Standing:  Alternating march with 1 UE assist 20X  Heelraises 20X  Toeraises 20X  Squats 10X in good form  4" forward step ups Rt LE leading 1 UE assist 10X  4" lateral step ups Rt LE with eccentric lowering 10X  4" forward lunge without UE assist 10X each LE Seated:  LAQ 10X5" Rt LE  Sit to stands no UE standard chair with black foam in seat 10X eccentric lowering Supine:Bridge 20X  Heelslides 20X Rt  SLR Rt LE 5X 2   06/07/22 Glute set 10X10 Bridge 20X Heelslides 20X Rt SLR Rt LE 5X Seated:  LAQ 10X5" Rt LE  Sit to stands no UE 10X STanding:  Alternating march with 1 UE assist 20X Nustep 5 minutes  Level 3 LE's only seat at 12 Scar massage instruction   06/02/22 Supine glute set 10 x 10 second holds Bridge 2x 10  Supine heel slides 2x 10  LAQ 10 x 5-10 second holds Standing alternating march 2x 10 bilateral with UE support  PATIENT EDUCATION:  Education details: 06/07/22:  reviewed goals, demonstrated hip kit, discussed healthcare plan, scar massage instruction; eval: Patient educated on exam findings, POC, scope of PT, HEP, and hip precautions. Person educated: Patient Education method: Explanation, Demonstration, and Handouts Education comprehension: verbalized understanding, returned demonstration, verbal cues required, and tactile cues required  HOME EXERCISE  PROGRAM: Access Code: [email or web address]  Date: 06/02/2022 - Supine Gluteal Sets  - 3 x daily - 7 x weekly - 10 reps - 10 second hold - Supine Bridge  - 3 x daily - 7 x weekly - 2 sets - 10 reps - Supine Heel Slide  - 3 x daily - 7 x weekly - 2 sets - 10 reps - Seated Long Arc Quad  - 3 x daily - 7 x weekly - 10 reps - 5-10 second hold  ASSESSMENT:  CLINICAL IMPRESSION: Today's session focused on lower extremity strengthening, gait training and balance.  Patient needed contact guard assist for tandem stance on foam beam.  Tolerated increased step height well today but needed verbal cues for control. Increased weight LAQ's for increased intensity.  Gait training in the parking lot for navigating uneven surfaces with SPC.  No loss of balance noted but continues with decreased stance right lower extremity. Improved heel strike toe off  with verbal cueing and demonstration. Patient will benefit from skilled physical therapy in order to improve function and reduce impairment.  OBJECTIVE IMPAIRMENTS Abnormal gait, decreased activity tolerance, decreased balance, decreased endurance, decreased mobility, difficulty walking, decreased ROM, decreased strength, increased muscle spasms, impaired flexibility, improper body mechanics, and pain.    ACTIVITY LIMITATIONS lifting, bending, standing, squatting, sleeping, stairs, transfers, locomotion level, and caring for others  PARTICIPATION LIMITATIONS: cleaning, laundry, driving, shopping, community activity, and yard work  [place] and 1-2 comorbidities: HLD, HTN, neuropathy  are also affecting patient's functional outcome.   REHAB POTENTIAL: Good  CLINICAL DECISION MAKING: Stable/uncomplicated  EVALUATION COMPLEXITY: Low   GOALS: Goals reviewed with patient? No  SHORT TERM GOALS: Target date: 06/23/2022  Patient will be independent with HEP in order to improve functional outcomes. Baseline:  Goal status: IN PROGRESS  2.  Patient will report at least 25% improvement in symptoms for improved quality of life. Baseline:  Goal status: IN PROGRESS   LONG TERM GOALS: Target date: 07/14/2022  Patient will report at least 75% improvement in symptoms for improved quality of life. Baseline:  Goal status: IN PROGRESS  2.  Patient will improve FOTO score by at least 20 points in order to indicate improved tolerance to activity. Baseline: 56% function Goal status: IN PROGRESS  3.  Patient will be able to navigate stairs with reciprocal pattern without compensation or UE use in order to demonstrate improved LE strength. Baseline: see above Goal status: IN PROGRESS  4.  Patient will be able to ambulate at least 500 feet in 2MWT with normal gait mechanics in order to demonstrate improved tolerance to activity. Baseline: 375 feet  Goal status: IN PROGRESS  5. Patient will demonstrate grade of 5/5 MMT grade in all tested musculature as evidence of improved strength to assist with stair ambulation and gait.   Baseline: see MMT Goal status: IN PROGRESS   PLAN: PT FREQUENCY: 2x/week  PT DURATION: 6 weeks  PLANNED INTERVENTIONS: Therapeutic exercises, Therapeutic activity, Neuromuscular re-education, Balance training, Gait training, Patient/Family education,  Joint manipulation, Joint mobilization, Stair training, Orthotic/Fit training, DME instructions, Aquatic Therapy, Dry Needling, Electrical stimulation, Spinal manipulation, Spinal mobilization, Cryotherapy, Moist heat, Compression bandaging, scar mobilization, Splintting, Taping, Traction, Ultrasound, Ionotophoresis 3m/ml Dexamethasone, and Manual therapy  PLAN FOR NEXT SESSION: direct lateral hip precautions, no hip abduction until 8/28,  improve strength and mobility and progress as able   9:08 AM, 06/14/22 Amy Small Lynch MPT [facility] [place] #[phone]

## 2022-06-14 NOTE — Telephone Encounter (Signed)
I'm not a nurse, but I called him. He is asking if he can go to his cousins house to go swimming / there are steps getting into the pool. Is it okay for him to swim? He said his incision is fully closed.

## 2022-06-14 NOTE — Telephone Encounter (Signed)
Patient had surgery on 05/16/22(Total Hip), states he is doing good, but has a few questions for Dr. Mort Sawyers nurse.  He is asking if you will give a return call at [phone]

## 2022-06-15 NOTE — Telephone Encounter (Signed)
I called him to advise, he voiced understanding  

## 2022-06-16 ENCOUNTER — Ambulatory Visit (INDEPENDENT_AMBULATORY_CARE_PROVIDER_SITE_OTHER): Payer: 59 | Admitting: Internal Medicine

## 2022-06-16 ENCOUNTER — Encounter: Payer: Self-pay | Admitting: Internal Medicine

## 2022-06-16 VITALS — BP 140/92 | HR 74 | Resp 16 | Ht 71.0 in | Wt 194.4 lb

## 2022-06-16 DIAGNOSIS — E782 Mixed hyperlipidemia: Secondary | ICD-10-CM | POA: Diagnosis not present

## 2022-06-16 DIAGNOSIS — G629 Polyneuropathy, unspecified: Secondary | ICD-10-CM | POA: Diagnosis not present

## 2022-06-16 DIAGNOSIS — I10 Essential (primary) hypertension: Secondary | ICD-10-CM

## 2022-06-16 DIAGNOSIS — K219 Gastro-esophageal reflux disease without esophagitis: Secondary | ICD-10-CM

## 2022-06-16 DIAGNOSIS — Z96641 Presence of right artificial hip joint: Secondary | ICD-10-CM

## 2022-06-16 MED ORDER — ATORVASTATIN CALCIUM 20 MG PO TABS
20.0000 mg | ORAL_TABLET | Freq: Every day | ORAL | 1 refills | Status: DC
Start: 2022-06-16 — End: 2022-12-04

## 2022-06-16 MED ORDER — TELMISARTAN 40 MG PO TABS: 40.0000 mg | ORAL_TABLET | Freq: Every day | ORAL | 1 refills | Status: DC

## 2022-06-16 MED ORDER — AMLODIPINE BESYLATE 5 MG PO TABS
5.0000 mg | ORAL_TABLET | Freq: Every day | ORAL | 1 refills | Status: DC
Start: 2022-06-16 — End: 2023-02-01

## 2022-06-16 NOTE — Assessment & Plan Note (Signed)
Lipid profile reviewed ?On Atorvastatin ?

## 2022-06-16 NOTE — Patient Instructions (Signed)
Please start taking Telmisartan 40 mg instead of 20 mg once daily. Okay to take 2 tablets of 20 mg until you have current supplies. Please continue taking Amlodipine 5 mg once daily.  Please continue to follow low salt diet and ambulate as tolerated.

## 2022-06-16 NOTE — Assessment & Plan Note (Signed)
Pepcid 20 mg BID

## 2022-06-16 NOTE — Assessment & Plan Note (Signed)
For right hip OA On Eliquis 2.5 mg BID for DVT ppx

## 2022-06-16 NOTE — Progress Notes (Signed)
Established Patient Office Visit  Subjective:  Patient ID: David Knox, male    DOB: 11/24/1965  Age: 56 y.o. MRN: [id number]  CC:  Chief Complaint  Patient presents with   Follow-up    Follow up bp check patient got confused on medications and stopped losartan and started back amlodipine    HPI David Knox is a 56 y.o. male with past medical history of HTN, peripheral neuropathy, GERD, hip arthritis and tobacco abuse who presents for f/u of his chronic medical conditions.  HTN: His BP was elevated today.  Of note, he is currently taking telmisartan and amlodipine.  He was taking losartan 50 mg as well, for which he was supposed to discontinue.  He currently denies any headache, dizziness, chest pain, dyspnea or palpitations.  He has had right THA and is participating in PT.  He reports improvement in right hip pain.  He uses cane for walking support.  He is on Eliquis for DVT ppx.    Past Medical History:  Diagnosis Date   Acid reflux     Past Surgical History:  Procedure Laterality Date   ESOPHAGOGASTRODUODENOSCOPY  2002   RMR: ulcerative reflux esophagitis, moderate sized hiatal hernia   HIP SURGERY Bilateral    as a child   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   Procedure: TOTAL HIP ARTHROPLASTY;  Surgeon: Carole Civil, MD;  Location: [facility];  Service: Orthopedics;  Laterality: Right;   WRIST SURGERY      Family History  Problem Relation Age of Onset   Diabetes Mother    Heart disease Mother    Diabetes Father    Heart disease Father    Colon cancer Neg Hx     Social History   Socioeconomic History   Marital status: Single    Spouse name: Not on file   Number of children: Not on file   Years of education: Not on file   Highest education level: Not on file  Occupational History   Not on file  Tobacco Use   Smoking status: Former    Packs/day: 1.00    Types: Cigarettes    Quit date: 12/22/2021    Years since quitting: 0.4    Smokeless tobacco: Never  Vaping Use   Vaping Use: Never used  Substance and Sexual Activity   Alcohol use: Yes    Comment: beer and liquor twice weekly   Drug use: No    Comment: "crack" former   Sexual activity: Not on file  Other Topics Concern   Not on file  Social History Narrative   Not on file   Social Determinants of Health   Financial Resource Strain: Not on file  Food Insecurity: Not on file  Transportation Needs: Not on file  Physical Activity: Not on file  Stress: Not on file  Social Connections: Not on file  Intimate Partner Violence: Not on file    Outpatient Medications Prior to Visit  Medication Sig Dispense Refill   acetaminophen (TYLENOL) 500 MG tablet Take 2 tablets (1,000 mg total) by mouth every 6 (six) hours. 30 tablet 0   docusate sodium (COLACE) 100 MG capsule Take 1 capsule (100 mg total) by mouth 2 (two) times daily. 10 capsule 0   ELIQUIS 2.5 MG TABS tablet TAKE 1 TABLET BY MOUTH TWICE A DAY 60 tablet 0   famotidine (PEPCID) 20 MG tablet TAKE 1 TABLET BY MOUTH TWICE A DAY 180 tablet 1   gabapentin (NEURONTIN) 300 MG  capsule Take 1 capsule (300 mg total) by mouth 2 (two) times daily. (Patient taking differently: Take 300 mg by mouth daily.) 60 capsule 5   naproxen (NAPROSYN) 500 MG tablet TAKE 1 TABLET BY MOUTH 2 TIMES DAILY WITH A MEAL. 60 tablet 2   omeprazole (PRILOSEC OTC) 20 MG tablet Take 20 mg by mouth daily.     oxycodone (OXY-IR) 5 MG capsule Take 5 mg by mouth every 6 (six) hours as needed for pain.     polyethylene glycol powder (GLYCOLAX/MIRALAX) 17 GM/SCOOP powder DISSOLVE 17 GRAMS IN 8 OZ OF FLUID LIQUID DRINK DAILY AS DIRECTED 238 g 0   amLODipine (NORVASC) 5 MG tablet Take 1 tablet (5 mg total) by mouth daily. 30 tablet 3   atorvastatin (LIPITOR) 20 MG tablet Take 1 tablet (20 mg total) by mouth daily. 90 tablet 1   telmisartan (MICARDIS) 20 MG tablet Take 1 tablet (20 mg total) by mouth daily. 30 tablet 3   losartan (COZAAR) 50 MG  tablet Take 50 mg by mouth daily. (Patient not taking: Reported on 06/16/2022)     No facility-administered medications prior to visit.    Allergies  Allergen Reactions   Aspirin Nausea Only    ROS Review of Systems  Constitutional:  Negative for chills and fever.  HENT:  Negative for congestion and sore throat.   Eyes:  Negative for pain and discharge.  Respiratory:  Negative for cough and shortness of breath.   Cardiovascular:  Negative for chest pain and palpitations.  Gastrointestinal:  Negative for diarrhea, nausea and vomiting.  Endocrine: Negative for polydipsia and polyuria.  Genitourinary:  Negative for dysuria and hematuria.  Musculoskeletal:  Positive for arthralgias and back pain. Negative for neck pain and neck stiffness.  Skin:  Negative for rash.  Neurological:  Positive for numbness (In left arm and feet). Negative for dizziness, weakness and headaches.  Psychiatric/Behavioral:  Negative for agitation and behavioral problems.       Objective:    Physical Exam Vitals reviewed.  Constitutional:      General: He is not in acute distress.    Appearance: He is not diaphoretic.  HENT:     Head: Normocephalic and atraumatic.     Nose: Nose normal.     Mouth/Throat:     Mouth: Mucous membranes are moist.  Eyes:     General: No scleral icterus.    Extraocular Movements: Extraocular movements intact.  Neck:     Vascular: No carotid bruit.  Cardiovascular:     Rate and Rhythm: Normal rate and regular rhythm.     Pulses: Normal pulses.     Heart sounds: Normal heart sounds. No murmur heard. Pulmonary:     Breath sounds: Normal breath sounds. No wheezing or rales.  Musculoskeletal:     Cervical back: Neck supple. No tenderness.     Right lower leg: No edema.     Left lower leg: No edema.  Skin:    General: Skin is warm.     Findings: No rash.  Neurological:     General: No focal deficit present.     Mental Status: He is alert and oriented to person, place,  and time.     Motor: No weakness.  Psychiatric:        Mood and Affect: Mood normal.        Behavior: Behavior normal.     BP (!) 140/92 (BP Location: Right Arm, Cuff Size: Normal)   Pulse 74  Resp 16   Ht '5\' 11"'  (1.803 m)   Wt 194 lb 6.4 oz (88.2 kg)   SpO2 99%   BMI 27.11 kg/m  Wt Readings from Last 3 Encounters:  06/16/22 194 lb 6.4 oz (88.2 kg)  05/24/22 192 lb 1.9 oz (87.1 kg)  05/18/22 169 lb (76.7 kg)    Lab Results  Component Value Date   TSH 0.821 04/18/2022   Lab Results  Component Value Date   WBC 9.4 05/18/2022   HGB 8.9 (L) 05/18/2022   HCT 26.9 (L) 05/18/2022   MCV 94.7 05/18/2022   PLT 251 05/18/2022   Lab Results  Component Value Date   NA 140 05/24/2022   K 4.7 05/24/2022   CO2 24 05/24/2022   GLUCOSE 252 (H) 05/24/2022   BUN 9 05/24/2022   CREATININE 0.91 05/24/2022   BILITOT 0.3 05/18/2022   ALKPHOS 47 05/18/2022   AST 48 (H) 05/18/2022   ALT 33 05/18/2022   PROT 6.6 05/18/2022   ALBUMIN 3.5 05/18/2022   CALCIUM 9.1 05/24/2022   ANIONGAP 10 05/18/2022   EGFR 100 05/24/2022   Lab Results  Component Value Date   CHOL 150 05/03/2021   Lab Results  Component Value Date   HDL 31 (L) 05/03/2021   Lab Results  Component Value Date   LDLCALC 64 05/03/2021   Lab Results  Component Value Date   TRIG 351 (H) 05/03/2021   Lab Results  Component Value Date   CHOLHDL 4.8 05/03/2021   Lab Results  Component Value Date   HGBA1C 6.0 (H) 04/18/2022      Assessment & Plan:   Problem List Items Addressed This Visit       Cardiovascular and Mediastinum   HTN (hypertension) - Primary    BP Readings from Last 1 Encounters:  06/16/22 (!) 140/92  Uncontrolled with Amlodipine 5 mg QD and Telmisartan 20 mg QD Increased dose of telmisartan to 40 mg daily Counseled for compliance with the medications Advised DASH diet and moderate exercise/walking, at least 150 mins/week      Relevant Medications   telmisartan (MICARDIS) 40 MG  tablet   amLODipine (NORVASC) 5 MG tablet   atorvastatin (LIPITOR) 20 MG tablet     Digestive   Gastroesophageal reflux disease without esophagitis    Pepcid 20 mg BID        Nervous and Auditory   Peripheral polyneuropathy    Better controlled with Gabapentin to 300 mg QD Has prediabetes, could be related to insulin resistance If persistent, will refer to Neurology        Other   Mixed hyperlipidemia    Lipid profile reviewed On Atorvastatin      Relevant Medications   telmisartan (MICARDIS) 40 MG tablet   amLODipine (NORVASC) 5 MG tablet   atorvastatin (LIPITOR) 20 MG tablet   S/P hip replacement, right 05/16/22    For right hip OA On Eliquis 2.5 mg BID for DVT ppx       Meds ordered this encounter  Medications   telmisartan (MICARDIS) 40 MG tablet    Sig: Take 1 tablet (40 mg total) by mouth daily.    Dispense:  90 tablet    Refill:  1    Dose change   amLODipine (NORVASC) 5 MG tablet    Sig: Take 1 tablet (5 mg total) by mouth daily.    Dispense:  90 tablet    Refill:  1   atorvastatin (LIPITOR) 20 MG tablet  Sig: Take 1 tablet (20 mg total) by mouth daily.    Dispense:  90 tablet    Refill:  1    Follow-up: Return if symptoms worsen or fail to improve.    Lindell Spar, MD

## 2022-06-16 NOTE — Assessment & Plan Note (Signed)
BP Readings from Last 1 Encounters:  06/16/22 (!) 140/92   Uncontrolled with Amlodipine 5 mg QD and Telmisartan 20 mg QD Increased dose of telmisartan to 40 mg daily Counseled for compliance with the medications Advised DASH diet and moderate exercise/walking, at least 150 mins/week

## 2022-06-16 NOTE — Assessment & Plan Note (Signed)
Better controlled with Gabapentin to 300 mg QD Has prediabetes, could be related to insulin resistance If persistent, will refer to Neurology

## 2022-06-19 ENCOUNTER — Encounter (HOSPITAL_COMMUNITY): Payer: Self-pay | Admitting: Physical Therapy

## 2022-06-19 ENCOUNTER — Ambulatory Visit (HOSPITAL_COMMUNITY): Payer: 59 | Admitting: Physical Therapy

## 2022-06-19 DIAGNOSIS — M6281 Muscle weakness (generalized): Secondary | ICD-10-CM

## 2022-06-19 DIAGNOSIS — M25551 Pain in right hip: Secondary | ICD-10-CM | POA: Diagnosis not present

## 2022-06-19 DIAGNOSIS — R29898 Other symptoms and signs involving the musculoskeletal system: Secondary | ICD-10-CM

## 2022-06-19 DIAGNOSIS — R2689 Other abnormalities of gait and mobility: Secondary | ICD-10-CM

## 2022-06-19 NOTE — Therapy (Signed)
OUTPATIENT PHYSICAL THERAPY TREATMENT  Patient Name: David Knox MRN: [id number] DOB:01-10-1966, 56 y.o., male Today's Date: 06/19/2022   PT End of Session - 06/19/22 1036     Visit Number 6    Number of Visits 12    Date for PT Re-Evaluation 07/14/22    Authorization Type [employer] (30 vl PT, OT, ST)    Authorization - Visit Number 6    Authorization - Number of Visits 30    PT Start Time 4098    PT Stop Time 1115    PT Time Calculation (min) 40 min    Activity Tolerance Patient tolerated treatment well    Behavior During Therapy [facility] for tasks assessed/performed               Past Medical History:  Diagnosis Date   Acid reflux    Past Surgical History:  Procedure Laterality Date   ESOPHAGOGASTRODUODENOSCOPY  2002   RMR: ulcerative reflux esophagitis, moderate sized hiatal hernia   HIP SURGERY Bilateral    as a child   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   Procedure: TOTAL HIP ARTHROPLASTY;  Surgeon: Carole Civil, MD;  Location: [facility];  Service: Orthopedics;  Laterality: Right;   WRIST SURGERY     Patient Active Problem List   Diagnosis Date Noted   S/P hip replacement, right 05/16/22 05/31/2022   Hypokalemia 05/24/2022   Anemia 05/24/2022   Hospital discharge follow-up 05/24/2022   Subclinical hyperthyroidism 06/22/2021   Vitamin D deficiency 06/22/2021   Prediabetes 06/22/2021   Mixed hyperlipidemia 06/22/2021   Encounter for general adult medical examination with abnormal findings 06/22/2021   Tobacco abuse 04/27/2021   HTN (hypertension) 04/27/2021   Peripheral polyneuropathy 04/27/2021   Gastroesophageal reflux disease without esophagitis 04/27/2021   Arthritis of right hip 04/27/2021   Alcohol abuse 12/19/2016   Substance abuse ([place]) 12/19/2016    PCP: Ihor Dow MD  REFERRING PROVIDER: Carole Civil, MD   REFERRING DIAG: M16.7 (ICD-10-CM) - Other secondary osteoarthritis of right hip   THERAPY DIAG:  Pain in  right hip  Muscle weakness (generalized)  Other abnormalities of gait and mobility  Other symptoms and signs involving the musculoskeletal system  Rationale for Evaluation and Treatment Rehabilitation  ONSET DATE: 05/16/22  SUBJECTIVE:   SUBJECTIVE STATEMENT: Patient reports his hip is healing and he was not very sore over the weekend.  He returns to the MD 06/28/22  PERTINENT HISTORY: hypertension, right hip arthritis, peripheral neuropathy, s/p R THA on 05/16/22 with a direct lateral approach.    PAIN:  Are you having pain? Yes: NPRS scale: 3/10 Pain location: right hip Pain description: tight, sore Aggravating factors: stiff in the mornings Relieving factors: movement  PRECAUTIONS: Other: direct lateral hip  WEIGHT BEARING RESTRICTIONS No  FALLS:  Has patient fallen in last 6 months? 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  PATIENT GOALS get back to normal    OBJECTIVE:   PATIENT SURVEYS:  FOTO 56% function  COGNITION:  Overall cognitive status: Within functional limits for tasks assessed     SENSATION: WFL   POSTURE: No Significant postural limitations  PALPATION: Grossly tender lateral hip and proximal thigh  LOWER EXTREMITY ROM:  decreased hip extension with ambulation  LOWER EXTREMITY MMT:  MMT Right eval Left eval  Hip flexion  3+ 5  Hip extension    Hip abduction    Hip adduction    Hip internal rotation    Hip external rotation    Knee flexion 5 5  Knee extension 4+ 5  Ankle dorsiflexion 5 5  Ankle plantarflexion    Ankle inversion    Ankle eversion     (Blank rows = not tested)    FUNCTIONAL TESTS:  5 times sit to stand: 16.04 seconds with UE support, attempts without UE use but unable 2 minute walk test: 375 feet with SPC Transfers: labored, relies LLE,  bilateral UE use Stairs: able to ascend with alternating with bilateral UE support, step too pattern on descend  GAIT: Distance walked: 375 feet Assistive device utilized: Single point cane Level of assistance: Modified independence Comments: 2 MWT, antalgic, decreased hip extension     TODAY'S TREATMENT: 06/19/22 HR/TR 1x 20  Alternating march 2x 10 bilateral  Step up 8 inch 2x 10  Lateral step up 6 inch 2x 10  Lateral step down 6 inch 1x 10  Mini squat 2x 10  STS with black foam 2 x 10  LAQ 5# 2x 10 5 second holds   06/14/22 Nustep 5 minutes seat 12 level 4 dynamic warm up  Gait training; Walk outdoors parking lot with SPC x 8 minutes up and down curb, ramp and uneven surfaces  Gait training with SPC x 1 lap in gym focus on form  Standing: Heel/toe raises x 20 no UE assist Slant board 5 x 20" Marching  x 20 no UE assist 8" step ups x 12 focus on control TKE's green x 30 Tandem stance on foam beam 2 x 30" each Mini squats 2 x 10 Sit to stand no UE assist from chair x 5   Seated: LAQs rt 5# 2 X 10 (sitting on foam for improved foot clearance)   06/12/22 Nustep 5 minutes seat 12 level 3 working up to level 4  Standing: Heel/toe raises x 20 Slant board 5 x 20" Marching 2 x 20  6" step ups 2 x 10 TKE's green x 20 Sit to stand  from chair with foam in seat no UE assist Foam stand NBOS x 30" eyes closed x 30"   Seated: LAQs 2# 2 X 10 Hip adduction with ball 5" hold 2 x 10  PATIENT EDUCATION:  Education details: 06/07/22:  reviewed goals, demonstrated hip kit, discussed healthcare plan, scar massage instruction; eval: Patient educated on exam findings, POC, scope of PT, HEP, and hip precautions. Person educated: Patient Education method: Explanation, Demonstration, and Handouts Education comprehension: verbalized understanding, returned demonstration, verbal cues required, and tactile cues required  HOME EXERCISE PROGRAM: Access Code: [email or web address]  Date:  06/02/2022 - Supine Gluteal Sets  - 3 x daily - 7 x weekly - 10 reps - 10 second hold - Supine Bridge  - 3 x daily - 7 x weekly - 2 sets - 10 reps - Supine Heel Slide  - 3 x daily - 7 x weekly - 2 sets - 10 reps - Seated Long Arc Quad  - 3 x daily - 7 x weekly - 10 reps - 5-10 second hold  ASSESSMENT:  CLINICAL IMPRESSION: Patient performing stairs with good mechanics following cueing for reducing push with contralateral LE. Patient tolerating additional stair exercises for quad and glute strengthening. He is able to perform most exercises without UE support. Unable to perform STS from standard chair due to LE weakness, added black foam to  elevate and able to complete. Good squat mechanics. Patient will continue to benefit from physical therapy in order to improve function and reduce impairment.   OBJECTIVE IMPAIRMENTS Abnormal gait, decreased activity tolerance, decreased balance, decreased endurance, decreased mobility, difficulty walking, decreased ROM, decreased strength, increased muscle spasms, impaired flexibility, improper body mechanics, and pain.   ACTIVITY LIMITATIONS lifting, bending, standing, squatting, sleeping, stairs, transfers, locomotion level, and caring for others  PARTICIPATION LIMITATIONS: cleaning, laundry, driving, shopping, community activity, and yard work  [place] and 1-2 comorbidities: HLD, HTN, neuropathy  are also affecting patient's functional outcome.   REHAB POTENTIAL: Good  CLINICAL DECISION MAKING: Stable/uncomplicated  EVALUATION COMPLEXITY: Low   GOALS: Goals reviewed with patient? No  SHORT TERM GOALS: Target date: 06/23/2022  Patient will be independent with HEP in order to improve functional outcomes. Baseline:  Goal status: IN PROGRESS  2.  Patient will report at least 25% improvement in symptoms for improved quality of life. Baseline:  Goal status: IN PROGRESS   LONG TERM GOALS: Target date: 07/14/2022  Patient will  report at least 75% improvement in symptoms for improved quality of life. Baseline:  Goal status: IN PROGRESS  2.  Patient will improve FOTO score by at least 20 points in order to indicate improved tolerance to activity. Baseline: 56% function Goal status: IN PROGRESS  3.  Patient will be able to navigate stairs with reciprocal pattern without compensation or UE use in order to demonstrate improved LE strength. Baseline: see above Goal status: IN PROGRESS  4.  Patient will be able to ambulate at least 500 feet in 2MWT with normal gait mechanics in order to demonstrate improved tolerance to activity. Baseline: 375 feet  Goal status: IN PROGRESS  5. Patient will demonstrate grade of 5/5 MMT grade in all tested musculature as evidence of improved strength to assist with stair ambulation and gait.   Baseline: see MMT Goal status: IN PROGRESS   PLAN: PT FREQUENCY: 2x/week  PT DURATION: 6 weeks  PLANNED INTERVENTIONS: Therapeutic exercises, Therapeutic activity, Neuromuscular re-education, Balance training, Gait training, Patient/Family education, Joint manipulation, Joint mobilization, Stair training, Orthotic/Fit training, DME instructions, Aquatic Therapy, Dry Needling, Electrical stimulation, Spinal manipulation, Spinal mobilization, Cryotherapy, Moist heat, Compression bandaging, scar mobilization, Splintting, Taping, Traction, Ultrasound, Ionotophoresis 66m/ml Dexamethasone, and Manual therapy  PLAN FOR NEXT SESSION: direct lateral hip precautions, no hip abduction until 8/28,  improve strength and mobility and progress as able   10:36 AM, 06/19/22 AMearl LatinPT, DPT Physical Therapist [facility]

## 2022-06-21 ENCOUNTER — Encounter (HOSPITAL_COMMUNITY): Payer: Self-pay | Admitting: Physical Therapy

## 2022-06-21 ENCOUNTER — Ambulatory Visit (HOSPITAL_COMMUNITY): Payer: 59 | Admitting: Physical Therapy

## 2022-06-21 DIAGNOSIS — M6281 Muscle weakness (generalized): Secondary | ICD-10-CM

## 2022-06-21 DIAGNOSIS — M25551 Pain in right hip: Secondary | ICD-10-CM | POA: Diagnosis not present

## 2022-06-21 DIAGNOSIS — R2689 Other abnormalities of gait and mobility: Secondary | ICD-10-CM

## 2022-06-21 DIAGNOSIS — R29898 Other symptoms and signs involving the musculoskeletal system: Secondary | ICD-10-CM

## 2022-06-21 NOTE — Therapy (Signed)
OUTPATIENT PHYSICAL THERAPY TREATMENT  Patient Name: David Knox MRN: [id number] DOB:1966-10-29, 56 y.o., male Today's Date: 06/21/2022   PT End of Session - 06/21/22 1034     Visit Number 7    Number of Visits 12    Date for PT Re-Evaluation 07/14/22    Authorization Type [employer] (30 vl PT, OT, ST)    Authorization - Visit Number 7    Authorization - Number of Visits 30    PT Start Time 3532    PT Stop Time 1115    PT Time Calculation (min) 40 min    Activity Tolerance Patient tolerated treatment well    Behavior During Therapy [facility] for tasks assessed/performed               Past Medical History:  Diagnosis Date   Acid reflux    Past Surgical History:  Procedure Laterality Date   ESOPHAGOGASTRODUODENOSCOPY  2002   RMR: ulcerative reflux esophagitis, moderate sized hiatal hernia   HIP SURGERY Bilateral    as a child   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   Procedure: TOTAL HIP ARTHROPLASTY;  Surgeon: Carole Civil, MD;  Location: [facility];  Service: Orthopedics;  Laterality: Right;   WRIST SURGERY     Patient Active Problem List   Diagnosis Date Noted   S/P hip replacement, right 05/16/22 05/31/2022   Hypokalemia 05/24/2022   Anemia 05/24/2022   Hospital discharge follow-up 05/24/2022   Subclinical hyperthyroidism 06/22/2021   Vitamin D deficiency 06/22/2021   Prediabetes 06/22/2021   Mixed hyperlipidemia 06/22/2021   Encounter for general adult medical examination with abnormal findings 06/22/2021   Tobacco abuse 04/27/2021   HTN (hypertension) 04/27/2021   Peripheral polyneuropathy 04/27/2021   Gastroesophageal reflux disease without esophagitis 04/27/2021   Arthritis of right hip 04/27/2021   Alcohol abuse 12/19/2016   Substance abuse ([place]) 12/19/2016    PCP: Ihor Dow MD  REFERRING PROVIDER: Carole Civil, MD   REFERRING DIAG: M16.7 (ICD-10-CM) - Other secondary osteoarthritis of right hip   THERAPY DIAG:  Pain in  right hip  Muscle weakness (generalized)  Other abnormalities of gait and mobility  Other symptoms and signs involving the musculoskeletal system  Rationale for Evaluation and Treatment Rehabilitation  ONSET DATE: 05/16/22  SUBJECTIVE:   SUBJECTIVE STATEMENT: Patient reports he wasn't very sore after last session.  He returns to the MD 06/28/22  PERTINENT HISTORY: hypertension, right hip arthritis, peripheral neuropathy, s/p R THA on 05/16/22 with a direct lateral approach.    PAIN:  Are you having pain? Yes: NPRS scale: 0/10 Pain location: right hip Pain description: tight, sore Aggravating factors: stiff in the mornings Relieving factors: movement  PRECAUTIONS: Other: direct lateral hip  WEIGHT BEARING RESTRICTIONS No  FALLS:  Has patient fallen in last 6 months? 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  PATIENT GOALS get back to normal    OBJECTIVE:   PATIENT SURVEYS:  FOTO 56% function  COGNITION:  Overall cognitive status: Within functional limits for tasks assessed     SENSATION: WFL   POSTURE: No Significant postural limitations  PALPATION: Grossly tender lateral hip and proximal thigh  LOWER EXTREMITY ROM:  decreased hip extension with ambulation  LOWER EXTREMITY MMT:  MMT Right eval Left eval  Hip flexion 3+ 5  Hip extension  Hip abduction    Hip adduction    Hip internal rotation    Hip external rotation    Knee flexion 5 5  Knee extension 4+ 5  Ankle dorsiflexion 5 5  Ankle plantarflexion    Ankle inversion    Ankle eversion     (Blank rows = not tested)    FUNCTIONAL TESTS:  5 times sit to stand: 16.04 seconds with UE support, attempts without UE use but unable 2 minute walk test: 375 feet with SPC Transfers: labored, relies LLE, bilateral UE  use Stairs: able to ascend with alternating with bilateral UE support, step too pattern on descend  GAIT: Distance walked: 375 feet Assistive device utilized: Single point cane Level of assistance: Modified independence Comments: 2 MWT, antalgic, decreased hip extension     TODAY'S TREATMENT: 06/21/22 HR/TR 1x 20  Step up 8 inch 2x 20 Lateral step up 8 inch 2x 10  STS 3x 10  Lateral step down 6 inch 2x 10  Standing hip flexor stretch at step 5 x 20 second holds SLS with vectors 5 x 5 second holds on RLE Nustep 5 minutes seat 12 level 5    06/19/22 HR/TR 1x 20  Alternating march 2x 10 bilateral  Step up 8 inch 2x 10  Lateral step up 6 inch 2x 10  Lateral step down 6 inch 1x 10  Mini squat 2x 10  STS with black foam 2 x 10  LAQ 5# 2x 10 5 second holds   06/14/22 Nustep 5 minutes seat 12 level 4 dynamic warm up  Gait training; Walk outdoors parking lot with SPC x 8 minutes up and down curb, ramp and uneven surfaces  Gait training with SPC x 1 lap in gym focus on form  Standing: Heel/toe raises x 20 no UE assist Slant board 5 x 20" Marching  x 20 no UE assist 8" step ups x 12 focus on control TKE's green x 30 Tandem stance on foam beam 2 x 30" each Mini squats 2 x 10 Sit to stand no UE assist from chair x 5   Seated: LAQs rt 5# 2 X 10 (sitting on foam for improved foot clearance)   06/12/22 Nustep 5 minutes seat 12 level 3 working up to level 4  Standing: Heel/toe raises x 20 Slant board 5 x 20" Marching 2 x 20  6" step ups 2 x 10 TKE's green x 20 Sit to stand  from chair with foam in seat no UE assist Foam stand NBOS x 30" eyes closed x 30"   Seated: LAQs 2# 2 X 10 Hip adduction with ball 5" hold 2 x 10  PATIENT EDUCATION:  Education details: 06/07/22:  reviewed goals, demonstrated hip kit, discussed healthcare plan, scar massage instruction; eval: Patient educated on exam findings, POC, scope of PT, HEP, and hip precautions. Person educated:  Patient Education method: Explanation, Demonstration, and Handouts Education comprehension: verbalized understanding, returned demonstration, verbal cues required, and tactile cues required  HOME EXERCISE PROGRAM: Access Code: [email or web address]  Date: 06/02/2022 - Supine Gluteal Sets  - 3 x daily - 7 x weekly - 10 reps - 10 second hold - Supine Bridge  - 3 x daily - 7 x weekly - 2 sets - 10 reps - Supine Heel Slide  - 3 x daily - 7 x weekly - 2 sets - 10 reps - Seated Long Arc Quad  - 3 x daily - 7 x weekly - 10 reps - 5-10 second hold  ASSESSMENT:  CLINICAL IMPRESSION: Patient continues to demonstrate improving strength and functional mobility. He is able to complete STS from standard chair today without UE support. Patient progressing very well. Patient will continue to benefit from physical therapy in order to improve function and reduce impairment.   OBJECTIVE IMPAIRMENTS Abnormal gait, decreased activity tolerance, decreased balance, decreased endurance, decreased mobility, difficulty walking, decreased ROM, decreased strength, increased muscle spasms, impaired flexibility, improper body mechanics, and pain.   ACTIVITY LIMITATIONS lifting, bending, standing, squatting, sleeping, stairs, transfers, locomotion level, and caring for others  PARTICIPATION LIMITATIONS: cleaning, laundry, driving, shopping, community activity, and yard work  [place] and 1-2 comorbidities: HLD, HTN, neuropathy  are also affecting patient's functional outcome.   REHAB POTENTIAL: Good  CLINICAL DECISION MAKING: Stable/uncomplicated  EVALUATION COMPLEXITY: Low   GOALS: Goals reviewed with patient? No  SHORT TERM GOALS: Target date: 06/23/2022  Patient will be independent with HEP in order to improve functional outcomes. Baseline:  Goal status: IN PROGRESS  2.  Patient will report at least 25% improvement in symptoms for improved quality of life. Baseline:  Goal status: IN  PROGRESS   LONG TERM GOALS: Target date: 07/14/2022  Patient will report at least 75% improvement in symptoms for improved quality of life. Baseline:  Goal status: IN PROGRESS  2.  Patient will improve FOTO score by at least 20 points in order to indicate improved tolerance to activity. Baseline: 56% function Goal status: IN PROGRESS  3.  Patient will be able to navigate stairs with reciprocal pattern without compensation or UE use in order to demonstrate improved LE strength. Baseline: see above Goal status: IN PROGRESS  4.  Patient will be able to ambulate at least 500 feet in 2MWT with normal gait mechanics in order to demonstrate improved tolerance to activity. Baseline: 375 feet  Goal status: IN PROGRESS  5. Patient will demonstrate grade of 5/5 MMT grade in all tested musculature as evidence of improved strength to assist with stair ambulation and gait.   Baseline: see MMT Goal status: IN PROGRESS   PLAN: PT FREQUENCY: 2x/week  PT DURATION: 6 weeks  PLANNED INTERVENTIONS: Therapeutic exercises, Therapeutic activity, Neuromuscular re-education, Balance training, Gait training, Patient/Family education, Joint manipulation, Joint mobilization, Stair training, Orthotic/Fit training, DME instructions, Aquatic Therapy, Dry Needling, Electrical stimulation, Spinal manipulation, Spinal mobilization, Cryotherapy, Moist heat, Compression bandaging, scar mobilization, Splintting, Taping, Traction, Ultrasound, Ionotophoresis 54m/ml Dexamethasone, and Manual therapy  PLAN FOR NEXT SESSION: direct lateral hip precautions, no hip abduction until 8/28,  improve strength and mobility and progress as able   10:34 AM, 06/21/22 AMearl LatinPT, DPT Physical Therapist [facility]

## 2022-06-24 ENCOUNTER — Other Ambulatory Visit: Payer: Self-pay | Admitting: Internal Medicine

## 2022-06-24 DIAGNOSIS — I10 Essential (primary) hypertension: Secondary | ICD-10-CM

## 2022-06-26 ENCOUNTER — Ambulatory Visit (HOSPITAL_COMMUNITY): Payer: 59

## 2022-06-26 DIAGNOSIS — R29898 Other symptoms and signs involving the musculoskeletal system: Secondary | ICD-10-CM

## 2022-06-26 DIAGNOSIS — M25551 Pain in right hip: Secondary | ICD-10-CM | POA: Diagnosis not present

## 2022-06-26 DIAGNOSIS — R2689 Other abnormalities of gait and mobility: Secondary | ICD-10-CM

## 2022-06-26 DIAGNOSIS — M6281 Muscle weakness (generalized): Secondary | ICD-10-CM

## 2022-06-26 NOTE — Therapy (Signed)
OUTPATIENT PHYSICAL THERAPY TREATMENT  Patient Name: David Knox MRN: [id number] DOB:1966/09/13, 56 y.o., male Today's Date: 06/26/2022   PT End of Session - 06/26/22 1056     Visit Number 8    Number of Visits 12    Date for PT Re-Evaluation 07/14/22    Authorization Type [employer] (30 vl PT, OT, ST)    Authorization - Visit Number 8    Authorization - Number of Visits 30    PT Start Time 5625    PT Stop Time 1115    PT Time Calculation (min) 43 min    Activity Tolerance Patient tolerated treatment well    Behavior During Therapy WFL for tasks assessed/performed                Past Medical History:  Diagnosis Date   Acid reflux    Past Surgical History:  Procedure Laterality Date   ESOPHAGOGASTRODUODENOSCOPY  2002   RMR: ulcerative reflux esophagitis, moderate sized hiatal hernia   HIP SURGERY Bilateral    as a child   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   Procedure: TOTAL HIP ARTHROPLASTY;  Surgeon: Carole Civil, MD;  Location: [facility];  Service: Orthopedics;  Laterality: Right;   WRIST SURGERY     Patient Active Problem List   Diagnosis Date Noted   S/P hip replacement, right 05/16/22 05/31/2022   Hypokalemia 05/24/2022   Anemia 05/24/2022   Hospital discharge follow-up 05/24/2022   Subclinical hyperthyroidism 06/22/2021   Vitamin D deficiency 06/22/2021   Prediabetes 06/22/2021   Mixed hyperlipidemia 06/22/2021   Encounter for general adult medical examination with abnormal findings 06/22/2021   Tobacco abuse 04/27/2021   HTN (hypertension) 04/27/2021   Peripheral polyneuropathy 04/27/2021   Gastroesophageal reflux disease without esophagitis 04/27/2021   Arthritis of right hip 04/27/2021   Alcohol abuse 12/19/2016   Substance abuse ([place]) 12/19/2016    PCP: Ihor Dow MD  REFERRING PROVIDER: Carole Civil, MD   REFERRING DIAG: M16.7 (ICD-10-CM) - Other secondary osteoarthritis of right hip   THERAPY DIAG:  Pain in  right hip  Muscle weakness (generalized)  Other abnormalities of gait and mobility  Other symptoms and signs involving the musculoskeletal system  Rationale for Evaluation and Treatment Rehabilitation  ONSET DATE: 05/16/22  SUBJECTIVE:   SUBJECTIVE STATEMENT: Very sore today; unsure why.  Quad soreness bilaterally.Did not sleep well last night because his dog was having stomach issues. He returns to the MD 06/28/22  PERTINENT HISTORY: hypertension, right hip arthritis, peripheral neuropathy, s/p R THA on 05/16/22 with a direct lateral approach.    PAIN:  Are you having pain? Yes: NPRS scale: 7-8/10 Pain location: right hip Pain description: tight, sore Aggravating factors: stiff in the mornings Relieving factors: movement  PRECAUTIONS: Other: direct lateral hip  WEIGHT BEARING RESTRICTIONS No  FALLS:  Has patient fallen in last 6 months? 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  PATIENT GOALS get back to normal    OBJECTIVE:   PATIENT SURVEYS:  FOTO 56% function  COGNITION:  Overall cognitive status: Within functional limits for tasks assessed     SENSATION: WFL   POSTURE: No Significant postural limitations  PALPATION: Grossly tender lateral hip and proximal thigh  LOWER EXTREMITY ROM:  decreased hip extension with ambulation  LOWER EXTREMITY MMT:  MMT Right  eval Left eval  Hip flexion 3+ 5  Hip extension    Hip abduction    Hip adduction    Hip internal rotation    Hip external rotation    Knee flexion 5 5  Knee extension 4+ 5  Ankle dorsiflexion 5 5  Ankle plantarflexion    Ankle inversion    Ankle eversion     (Blank rows = not tested)    FUNCTIONAL TESTS:  5 times sit to stand: 16.04 seconds with UE support, attempts without UE use but unable 2 minute walk  test: 375 feet with SPC Transfers: labored, relies LLE, bilateral UE use Stairs: able to ascend with alternating with bilateral UE support, step too pattern on descend  GAIT: Distance walked: 375 feet Assistive device utilized: Single point cane Level of assistance: Modified independence Comments: 2 MWT, antalgic, decreased hip extension     TODAY'S TREATMENT: 06/26/22 Nustep 5 minutes seat 12 level 5  1/2 lunge on 6" step with trunk extension for quad stretch 5 x 20" each STS 3 x 10 Step ups 3 x 10 8" step Standing hip vectors standing on right leg 5x Heel raises/ toe raises x 20 Slant board 5 x 20" Standing on balance beam 2 x 30" looking up       06/21/22 HR/TR 1x 20  Step up 8 inch 2x 20 Lateral step up 8 inch 2x 10  STS 3x 10  Lateral step down 6 inch 2x 10  Standing hip flexor stretch at step 5 x 20 second holds SLS with vectors 5 x 5 second holds on RLE Nustep 5 minutes seat 12 level 5    06/19/22 HR/TR 1x 20  Alternating march 2x 10 bilateral  Step up 8 inch 2x 10  Lateral step up 6 inch 2x 10  Lateral step down 6 inch 1x 10  Mini squat 2x 10  STS with black foam 2 x 10  LAQ 5# 2x 10 5 second holds   06/14/22 Nustep 5 minutes seat 12 level 4 dynamic warm up  Gait training; Walk outdoors parking lot with SPC x 8 minutes up and down curb, ramp and uneven surfaces  Gait training with SPC x 1 lap in gym focus on form  Standing: Heel/toe raises x 20 no UE assist Slant board 5 x 20" Marching  x 20 no UE assist 8" step ups x 12 focus on control TKE's green x 30 Tandem stance on foam beam 2 x 30" each Mini squats 2 x 10 Sit to stand no UE assist from chair x 5   Seated: LAQs rt 5# 2 X 10 (sitting on foam for improved foot clearance)   06/12/22 Nustep 5 minutes seat 12 level 3 working up to level 4  Standing: Heel/toe raises x 20 Slant board 5 x 20" Marching 2 x 20  6" step ups 2 x 10 TKE's green x 20 Sit to stand  from chair with foam in seat  no UE assist Foam stand NBOS x 30" eyes closed x 30"   Seated: LAQs 2# 2 X 10 Hip adduction with ball 5" hold 2 x 10  PATIENT EDUCATION:  Education details: 06/07/22:  reviewed goals, demonstrated hip kit, discussed healthcare plan, scar massage instruction; eval: Patient educated on exam findings, POC, scope of PT, HEP, and hip precautions. Person educated: Patient Education method: Explanation, Demonstration, and Handouts Education comprehension: verbalized understanding, returned demonstration, verbal cues required, and tactile cues required  HOME EXERCISE PROGRAM: Access  Code: [email or web address]  Date: 06/02/2022 - Supine Gluteal Sets  - 3 x daily - 7 x weekly - 10 reps - 10 second hold - Supine Bridge  - 3 x daily - 7 x weekly - 2 sets - 10 reps - Supine Heel Slide  - 3 x daily - 7 x weekly - 2 sets - 10 reps - Seated Long Arc Quad  - 3 x daily - 7 x weekly - 10 reps - 5-10 second hold  ASSESSMENT:  CLINICAL IMPRESSION: Today's session continued to focus on right leg strengthening.  Patient with some report of quad soreness today but improves as treatment progresses.  PT encourages patient to walk  at least hourly to reduce soreness and he verbalizes agreement. Added balance activity on foam beam; patient without loss of balance during activity.Patient continues to progress well. Patient will continue to benefit from physical therapy in order to improve function and reduce impairment.   OBJECTIVE IMPAIRMENTS Abnormal gait, decreased activity tolerance, decreased balance, decreased endurance, decreased mobility, difficulty walking, decreased ROM, decreased strength, increased muscle spasms, impaired flexibility, improper body mechanics, and pain.   ACTIVITY LIMITATIONS lifting, bending, standing, squatting, sleeping, stairs, transfers, locomotion level, and caring for others  PARTICIPATION LIMITATIONS: cleaning, laundry, driving, shopping, community activity, and yard work  [place] and 1-2 comorbidities: HLD, HTN, neuropathy  are also affecting patient's functional outcome.   REHAB POTENTIAL: Good  CLINICAL DECISION MAKING: Stable/uncomplicated  EVALUATION COMPLEXITY: Low   GOALS: Goals reviewed with patient? No  SHORT TERM GOALS: Target date: 06/23/2022  Patient will be independent with HEP in order to improve functional outcomes. Baseline:  Goal status: IN PROGRESS  2.  Patient will report at least 25% improvement in symptoms for improved quality of life. Baseline:  Goal status: IN PROGRESS   LONG TERM GOALS: Target date: 07/14/2022  Patient will report at least 75% improvement in symptoms for improved quality of life. Baseline:  Goal status: IN PROGRESS  2.  Patient will improve FOTO score by at least 20 points in order to indicate improved tolerance to activity. Baseline: 56% function Goal status: IN PROGRESS  3.  Patient will be able to navigate stairs with reciprocal pattern without compensation or UE use in order to demonstrate improved LE strength. Baseline: see above Goal status: IN PROGRESS  4.  Patient will be able to ambulate at least 500 feet in 2MWT with normal gait mechanics in order to demonstrate improved tolerance to activity. Baseline: 375 feet  Goal status: IN PROGRESS  5. Patient will demonstrate grade of 5/5 MMT grade in all tested musculature as evidence of improved strength to assist with stair ambulation and gait.   Baseline: see MMT Goal status: IN PROGRESS   PLAN: PT FREQUENCY: 2x/week  PT DURATION: 6 weeks  PLANNED INTERVENTIONS: Therapeutic exercises, Therapeutic activity, Neuromuscular re-education, Balance training, Gait training, Patient/Family education, Joint manipulation, Joint mobilization, Stair training, Orthotic/Fit training, DME instructions, Aquatic Therapy, Dry Needling, Electrical stimulation, Spinal manipulation, Spinal mobilization, Cryotherapy, Moist heat, Compression bandaging, scar  mobilization, Splintting, Taping, Traction, Ultrasound, Ionotophoresis 33m/ml Dexamethasone, and Manual therapy  PLAN FOR NEXT SESSION: direct lateral hip precautions, no hip abduction until 8/28,  improve strength and mobility and progress as able   11:18 AM, 06/26/22 Arpan Eskelson [place] MPT [facility] [place] [phone]

## 2022-06-27 NOTE — Progress Notes (Unsigned)
Referring Provider: Anabel Halon, MD Primary Care Physician:  Anabel Halon, MD Primary Gastroenterologist:  Dr. Jena Gauss  No chief complaint on file.   HPI:   David Knox is a 56 y.o. male presenting today at the request of Anabel Halon, MD for consult colonoscopy.  Recommended office visit due to history of drug use.   We saw patient previously in September 2018 for epigastric abdominal pain, reflux, odynophagia, and colon cancer screening.  Reported severe heartburn for years with associated vomiting.  Some odynophagia, but no dysphagia.  Some hematemesis intermittently.  Epigastric pain.  No BRBPR or melena.  Abdominal exam benign.  He was started on Protonix 40 mg daily and plan to proceed with EGD and colonoscopy.   Patient no-show for his preop appointment and we have not seen him since.  Today:      Per chart review, recent hemoglobin down to 8.9 following total right hip arthroplasty in July. Hemoglobin previously normal.   Past Medical History:  Diagnosis Date   Acid reflux     Past Surgical History:  Procedure Laterality Date   ESOPHAGOGASTRODUODENOSCOPY  2002   RMR: ulcerative reflux esophagitis, moderate sized hiatal hernia   HIP SURGERY Bilateral    as a child   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   Procedure: TOTAL HIP ARTHROPLASTY;  Surgeon: Vickki Hearing, MD;  Location: [facility];  Service: Orthopedics;  Laterality: Right;   WRIST SURGERY      Current Outpatient Medications  Medication Sig Dispense Refill   acetaminophen (TYLENOL) 500 MG tablet Take 2 tablets (1,000 mg total) by mouth every 6 (six) hours. 30 tablet 0   amLODipine (NORVASC) 5 MG tablet Take 1 tablet (5 mg total) by mouth daily. 90 tablet 1   atorvastatin (LIPITOR) 20 MG tablet Take 1 tablet (20 mg total) by mouth daily. 90 tablet 1   docusate sodium (COLACE) 100 MG capsule Take 1 capsule (100 mg total) by mouth 2 (two) times daily. 10 capsule 0   ELIQUIS 2.5 MG TABS  tablet TAKE 1 TABLET BY MOUTH TWICE A DAY 60 tablet 0   famotidine (PEPCID) 20 MG tablet TAKE 1 TABLET BY MOUTH TWICE A DAY 180 tablet 1   gabapentin (NEURONTIN) 300 MG capsule Take 1 capsule (300 mg total) by mouth 2 (two) times daily. (Patient taking differently: Take 300 mg by mouth daily.) 60 capsule 5   naproxen (NAPROSYN) 500 MG tablet TAKE 1 TABLET BY MOUTH 2 TIMES DAILY WITH A MEAL. 60 tablet 2   omeprazole (PRILOSEC OTC) 20 MG tablet Take 20 mg by mouth daily.     oxycodone (OXY-IR) 5 MG capsule Take 5 mg by mouth every 6 (six) hours as needed for pain.     polyethylene glycol powder (GLYCOLAX/MIRALAX) 17 GM/SCOOP powder DISSOLVE 17 GRAMS IN 8 OZ OF FLUID LIQUID DRINK DAILY AS DIRECTED 238 g 0   telmisartan (MICARDIS) 40 MG tablet Take 1 tablet (40 mg total) by mouth daily. 90 tablet 1   No current facility-administered medications for this visit.    Allergies as of 06/29/2022 - Review Complete 06/21/2022  Allergen Reaction Noted   Aspirin Nausea Only 05/18/2022    Family History  Problem Relation Age of Onset   Diabetes Mother    Heart disease Mother    Diabetes Father    Heart disease Father    Colon cancer Neg Hx     Social History   Socioeconomic History   Marital  status: Single    Spouse name: Not on file   Number of children: Not on file   Years of education: Not on file   Highest education level: Not on file  Occupational History   Not on file  Tobacco Use   Smoking status: Former    Packs/day: 1.00    Types: Cigarettes    Quit date: 12/22/2021    Years since quitting: 0.5   Smokeless tobacco: Never  Vaping Use   Vaping Use: Never used  Substance and Sexual Activity   Alcohol use: Yes    Comment: beer and liquor twice weekly   Drug use: No    Comment: "crack" former   Sexual activity: Not on file  Other Topics Concern   Not on file  Social History Narrative   Not on file   Social Determinants of Health   Financial Resource Strain: Not on file   Food Insecurity: Not on file  Transportation Needs: Not on file  Physical Activity: Not on file  Stress: Not on file  Social Connections: Not on file  Intimate Partner Violence: Not on file    Review of Systems: Gen: Denies any fever, chills, cold or flulike symptoms, presyncope, syncope. CV: Denies chest pain, heart palpitations.  Resp: Denies shortness of breath, cough.  GI: See HPI GU : Denies urinary burning, urinary frequency, urinary hesitancy MS: Denies joint pain. Derm: Denies rash. Psych: Denies depression, anxiety. Heme: See HPI  Physical Exam: There were no vitals taken for this visit. General:   Alert and oriented. Pleasant and cooperative. Well-nourished and well-developed.  Head:  Normocephalic and atraumatic. Eyes:  Without icterus, sclera clear and conjunctiva pink.  Ears:  Normal auditory acuity. Lungs:  Clear to auscultation bilaterally. No wheezes, rales, or rhonchi. No distress.  Heart:  S1, S2 present without murmurs appreciated.  Abdomen:  +BS, soft, non-tender and non-distended. No HSM noted. No guarding or rebound. No masses appreciated.  Rectal:  Deferred  Msk:  Symmetrical without gross deformities. Normal posture. Extremities:  Without edema. Neurologic:  Alert and  oriented x4;  grossly normal neurologically. Skin:  Intact without significant lesions or rashes. Psych:  Normal mood and affect.    Assessment:     Plan:  ***   Ermalinda Memos, PA-C [facility] 06/29/2022

## 2022-06-28 ENCOUNTER — Encounter: Payer: Self-pay | Admitting: Orthopedic Surgery

## 2022-06-28 ENCOUNTER — Encounter (HOSPITAL_COMMUNITY): Payer: 59

## 2022-06-28 ENCOUNTER — Ambulatory Visit (INDEPENDENT_AMBULATORY_CARE_PROVIDER_SITE_OTHER): Payer: 59 | Admitting: Orthopedic Surgery

## 2022-06-28 DIAGNOSIS — Z96641 Presence of right artificial hip joint: Secondary | ICD-10-CM

## 2022-06-28 NOTE — Progress Notes (Signed)
Chief Complaint  Patient presents with   Routine Post Op    S/p THA DOS 05/16/22   Routine postop visit at 6 weeks for David Knox  He still using his cane for some weakness in his abductors which is common in this approach (direct lateral)  His pain is well controlled with just Tylenol  His hip flexion is excellent his leg lengths and equal we will reevaluate that on his next visit  Recommend follow-up in a month and continue his physical therapy

## 2022-06-29 ENCOUNTER — Ambulatory Visit (INDEPENDENT_AMBULATORY_CARE_PROVIDER_SITE_OTHER): Payer: Self-pay | Admitting: Gastroenterology

## 2022-06-29 ENCOUNTER — Other Ambulatory Visit: Payer: Self-pay | Admitting: *Deleted

## 2022-06-29 ENCOUNTER — Encounter: Payer: Self-pay | Admitting: *Deleted

## 2022-06-29 ENCOUNTER — Encounter: Payer: Self-pay | Admitting: Gastroenterology

## 2022-06-29 VITALS — BP 130/83 | HR 80 | Temp 97.8°F | Ht 71.0 in | Wt 193.8 lb

## 2022-06-29 DIAGNOSIS — R194 Change in bowel habit: Secondary | ICD-10-CM

## 2022-06-29 DIAGNOSIS — Z1211 Encounter for screening for malignant neoplasm of colon: Secondary | ICD-10-CM

## 2022-06-29 DIAGNOSIS — K219 Gastro-esophageal reflux disease without esophagitis: Secondary | ICD-10-CM

## 2022-06-29 MED ORDER — PEG 3350-KCL-NA BICARB-NACL 420 G PO SOLR: 4000.0000 mL | Freq: Once | ORAL | 0 refills | Status: AC

## 2022-06-29 MED ORDER — PANTOPRAZOLE SODIUM 20 MG PO TBEC: 20.0000 mg | DELAYED_RELEASE_TABLET | Freq: Every day | ORAL | 3 refills | Status: DC

## 2022-06-29 NOTE — Patient Instructions (Addendum)
We will arrange for her to have a colonoscopy in the near future with Dr. Marletta Lor.  Have blood work completed at [facility] to screen for celiac disease.  Try taking lactacid tablets before eating any dairy.   Stop omeprazole and start Protonix 20 mg daily 30 minutes before breakfast for reflux.  We will see if this helps with your frequent bowel movements as well.  We will follow-up with you in the office after your colonoscopy.  Do not hesitate to call if you have any questions or concerns prior to next visit.  It was very nice to meet you today!  Ermalinda Memos, PA-C [facility]enter[facility]

## 2022-07-03 ENCOUNTER — Ambulatory Visit (HOSPITAL_COMMUNITY): Payer: 59

## 2022-07-03 DIAGNOSIS — M25551 Pain in right hip: Secondary | ICD-10-CM | POA: Diagnosis not present

## 2022-07-03 DIAGNOSIS — R29898 Other symptoms and signs involving the musculoskeletal system: Secondary | ICD-10-CM

## 2022-07-03 DIAGNOSIS — R2689 Other abnormalities of gait and mobility: Secondary | ICD-10-CM

## 2022-07-03 DIAGNOSIS — M6281 Muscle weakness (generalized): Secondary | ICD-10-CM

## 2022-07-03 NOTE — Therapy (Signed)
OUTPATIENT PHYSICAL THERAPY TREATMENT  Patient Name: David Knox MRN: [id number] DOB:June 18, 1966, 56 y.o., male Today's Date: 07/03/2022   PT End of Session - 07/03/22 1038     Visit Number 9    Number of Visits 12    Date for PT Re-Evaluation 07/14/22    Authorization Type [employer] (30 vl PT, OT, ST)    Authorization - Visit Number 9    Authorization - Number of Visits 30    PT Start Time 4010    PT Stop Time 1114    PT Time Calculation (min) 42 min    Activity Tolerance Patient tolerated treatment well    Behavior During Therapy WFL for tasks assessed/performed                 Past Medical History:  Diagnosis Date   Acid reflux    HLD (hyperlipidemia)    HTN (hypertension)    Past Surgical History:  Procedure Laterality Date   ESOPHAGOGASTRODUODENOSCOPY  2002   RMR: ulcerative reflux esophagitis, moderate sized hiatal hernia   HIP SURGERY Bilateral    as a child   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   Procedure: TOTAL HIP ARTHROPLASTY;  Surgeon: Carole Civil, MD;  Location: [facility];  Service: Orthopedics;  Laterality: Right;   WRIST SURGERY     Patient Active Problem List   Diagnosis Date Noted   Frequent bowel movements 06/29/2022   S/P hip replacement, right 05/16/22 05/31/2022   Hypokalemia 05/24/2022   Anemia 05/24/2022   Hospital discharge follow-up 05/24/2022   Subclinical hyperthyroidism 06/22/2021   Vitamin D deficiency 06/22/2021   Prediabetes 06/22/2021   Mixed hyperlipidemia 06/22/2021   Colon cancer screening 06/22/2021   Tobacco abuse 04/27/2021   HTN (hypertension) 04/27/2021   Peripheral polyneuropathy 04/27/2021   Gastroesophageal reflux disease 04/27/2021   Arthritis of right hip 04/27/2021   Alcohol abuse 12/19/2016   Substance abuse ([place]) 12/19/2016    PCP: Ihor Dow MD  REFERRING PROVIDER: Carole Civil, MD   REFERRING DIAG: M16.7 (ICD-10-CM) - Other secondary osteoarthritis of right hip    THERAPY DIAG:  Pain in right hip  Muscle weakness (generalized)  Other abnormalities of gait and mobility  Other symptoms and signs involving the musculoskeletal system  Rationale for Evaluation and Treatment Rehabilitation  ONSET DATE: 05/16/22  SUBJECTIVE:   SUBJECTIVE STATEMENT: Had good appointment with MD; feeling ok today. 5/10 soreness  PERTINENT HISTORY: hypertension, right hip arthritis, peripheral neuropathy, s/p R THA on 05/16/22 with a direct lateral approach.    PAIN:  Are you having pain? Yes: NPRS scale: 5/10 Pain location: right hip Pain description: tight, sore Aggravating factors: stiff in the mornings Relieving factors: movement  PRECAUTIONS: Other: direct lateral hip  WEIGHT BEARING RESTRICTIONS No  FALLS:  Has patient fallen in last 6 months? 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  PATIENT GOALS get back to normal    OBJECTIVE:   PATIENT SURVEYS:  FOTO 56% function  COGNITION:  Overall cognitive status: Within functional limits for tasks assessed     SENSATION: WFL   POSTURE: No Significant postural limitations  PALPATION: Grossly tender lateral hip and proximal thigh  LOWER EXTREMITY ROM:  decreased hip extension with ambulation  LOWER EXTREMITY MMT:  MMT Right eval Left eval  Hip flexion 3+ 5  Hip extension    Hip abduction    Hip adduction    Hip internal rotation    Hip external rotation    Knee flexion 5 5  Knee extension 4+ 5  Ankle dorsiflexion 5 5  Ankle plantarflexion    Ankle inversion    Ankle eversion     (Blank rows = not tested)    FUNCTIONAL TESTS:  5 times sit to stand: 16.04 seconds with UE support, attempts without UE use but unable 2 minute walk test: 375 feet with SPC Transfers: labored, relies LLE, bilateral UE  use Stairs: able to ascend with alternating with bilateral UE support, step too pattern on descend  GAIT: Distance walked: 375 feet Assistive device utilized: Single point cane Level of assistance: Modified independence Comments: 2 MWT, antalgic, decreased hip extension     TODAY'S TREATMENT: 07/03/22 Nustep 5 minutes seat 12 level 5  Heel/toe raises 2 x 10 Slant board 5 x 20" 6" lateral step ups 2 x 10 Hip vectors 3" x 8 each 1/2 lunge on 8" step with trunk extension for quad stretch 5 x 20" each Green thera band sidestepping blue line down and back x 3  Gait training without assistive device x 100 ft  5 times sit to stand 13 sec today no UE assist           06/26/22 Nustep 5 minutes seat 12 level 5  1/2 lunge on 6" step with trunk extension for quad stretch 5 x 20" each STS 3 x 10 Step ups 3 x 10 8" step Standing hip vectors standing on right leg 5x Heel raises/ toe raises x 20 Slant board 5 x 20" Standing on balance beam 2 x 30" looking up       06/21/22 HR/TR 1x 20  Step up 8 inch 2x 20 Lateral step up 8 inch 2x 10  STS 3x 10  Lateral step down 6 inch 2x 10  Standing hip flexor stretch at step 5 x 20 second holds SLS with vectors 5 x 5 second holds on RLE Nustep 5 minutes seat 12 level 5    06/19/22 HR/TR 1x 20  Alternating march 2x 10 bilateral  Step up 8 inch 2x 10  Lateral step up 6 inch 2x 10  Lateral step down 6 inch 1x 10  Mini squat 2x 10  STS with black foam 2 x 10  LAQ 5# 2x 10 5 second holds   06/14/22 Nustep 5 minutes seat 12 level 4 dynamic warm up  Gait training; Walk outdoors parking lot with SPC x 8 minutes up and down curb, ramp and uneven surfaces  Gait training with SPC x 1 lap in gym focus on form  Standing: Heel/toe raises x 20 no UE assist Slant board 5 x 20" Marching  x 20 no UE assist 8" step ups x 12 focus on control TKE's green x 30 Tandem stance on foam beam 2 x 30" each Mini squats 2 x 10 Sit to stand no UE  assist from chair x 5   Seated: LAQs rt 5# 2 X 10 (sitting on foam for improved foot clearance)   06/12/22 Nustep 5 minutes seat 12 level 3 working up to level 4  Standing: Heel/toe raises x 20 Slant board 5 x 20" Marching 2 x 20  6" step ups 2 x 10 TKE's green x 20 Sit to stand  from chair with foam in seat no UE assist Foam stand NBOS x 30" eyes  closed x 30"   Seated: LAQs 2# 2 X 10 Hip adduction with ball 5" hold 2 x 10  PATIENT EDUCATION:  Education details: 06/07/22:  reviewed goals, demonstrated hip kit, discussed healthcare plan, scar massage instruction; eval: Patient educated on exam findings, POC, scope of PT, HEP, and hip precautions. Person educated: Patient Education method: Explanation, Demonstration, and Handouts Education comprehension: verbalized understanding, returned demonstration, verbal cues required, and tactile cues required  HOME EXERCISE PROGRAM: Access Code: [email or web address]  Date: 06/02/2022 - Supine Gluteal Sets  - 3 x daily - 7 x weekly - 10 reps - 10 second hold - Supine Bridge  - 3 x daily - 7 x weekly - 2 sets - 10 reps - Supine Heel Slide  - 3 x daily - 7 x weekly - 2 sets - 10 reps - Seated Long Arc Quad  - 3 x daily - 7 x weekly - 10 reps - 5-10 second hold  ASSESSMENT:  CLINICAL IMPRESSION: Today's session continued to focus on lower extremity strengthening, balance, gait. Improved score with 5 times sit to stand from evaluation.  Gait training without SPC today; patient needs cues to avoid lateral trunk movement with right stance phase and shows improvement with increased awareness. Patient will continue to benefit from physical therapy in order to improve function and reduce impairment.   OBJECTIVE IMPAIRMENTS Abnormal gait, decreased activity tolerance, decreased balance, decreased endurance, decreased mobility, difficulty walking, decreased ROM, decreased strength, increased muscle spasms, impaired flexibility, improper body mechanics, and  pain.   ACTIVITY LIMITATIONS lifting, bending, standing, squatting, sleeping, stairs, transfers, locomotion level, and caring for others  PARTICIPATION LIMITATIONS: cleaning, laundry, driving, shopping, community activity, and yard work  [place] and 1-2 comorbidities: HLD, HTN, neuropathy  are also affecting patient's functional outcome.   REHAB POTENTIAL: Good  CLINICAL DECISION MAKING: Stable/uncomplicated  EVALUATION COMPLEXITY: Low   GOALS: Goals reviewed with patient? No  SHORT TERM GOALS: Target date: 06/23/2022  Patient will be independent with HEP in order to improve functional outcomes. Baseline:  Goal status: IN PROGRESS  2.  Patient will report at least 25% improvement in symptoms for improved quality of life. Baseline:  Goal status: IN PROGRESS   LONG TERM GOALS: Target date: 07/14/2022  Patient will report at least 75% improvement in symptoms for improved quality of life. Baseline:  Goal status: IN PROGRESS  2.  Patient will improve FOTO score by at least 20 points in order to indicate improved tolerance to activity. Baseline: 56% function Goal status: IN PROGRESS  3.  Patient will be able to navigate stairs with reciprocal pattern without compensation or UE use in order to demonstrate improved LE strength. Baseline: see above Goal status: IN PROGRESS  4.  Patient will be able to ambulate at least 500 feet in 2MWT with normal gait mechanics in order to demonstrate improved tolerance to activity. Baseline: 375 feet  Goal status: IN PROGRESS  5. Patient will demonstrate grade of 5/5 MMT grade in all tested musculature as evidence of improved strength to assist with stair ambulation and gait.   Baseline: see MMT Goal status: IN PROGRESS   PLAN: PT FREQUENCY: 2x/week  PT DURATION: 6 weeks  PLANNED INTERVENTIONS: Therapeutic exercises, Therapeutic activity, Neuromuscular re-education, Balance training, Gait training, Patient/Family  education, Joint manipulation, Joint mobilization, Stair training, Orthotic/Fit training, DME instructions, Aquatic Therapy, Dry Needling, Electrical stimulation, Spinal manipulation, Spinal mobilization, Cryotherapy, Moist heat, Compression bandaging, scar mobilization, Splintting, Taping, Traction, Ultrasound, Ionotophoresis 70m/ml Dexamethasone, and Manual  therapy  PLAN FOR NEXT SESSION: direct lateral hip precautions, no hip abduction until 8/28,  improve strength and mobility and progress as able   11:14 AM, 07/03/22 Yakira Duquette Small Kyzer Blowe MPT [place] [facility] [place] [phone] RU:[phone]

## 2022-07-05 ENCOUNTER — Ambulatory Visit (HOSPITAL_COMMUNITY): Payer: 59

## 2022-07-05 DIAGNOSIS — M25551 Pain in right hip: Secondary | ICD-10-CM | POA: Diagnosis not present

## 2022-07-05 DIAGNOSIS — R2689 Other abnormalities of gait and mobility: Secondary | ICD-10-CM

## 2022-07-05 DIAGNOSIS — R29898 Other symptoms and signs involving the musculoskeletal system: Secondary | ICD-10-CM

## 2022-07-05 DIAGNOSIS — M6281 Muscle weakness (generalized): Secondary | ICD-10-CM

## 2022-07-05 NOTE — Therapy (Signed)
OUTPATIENT PHYSICAL THERAPY TREATMENT  Patient Name: David Knox MRN: [id number] DOB:1966/03/29, 56 y.o., male Today's Date: 07/05/2022   PT End of Session - 07/05/22 1031     Visit Number 10    Number of Visits 12    Date for PT Re-Evaluation 07/14/22    Authorization Type [employer] (30 vl PT, OT, ST)    Authorization - Visit Number 9    Authorization - Number of Visits 30    PT Start Time 1165    PT Stop Time 1110    PT Time Calculation (min) 40 min    Activity Tolerance Patient tolerated treatment well    Behavior During Therapy WFL for tasks assessed/performed                  Past Medical History:  Diagnosis Date   Acid reflux    HLD (hyperlipidemia)    HTN (hypertension)    Past Surgical History:  Procedure Laterality Date   ESOPHAGOGASTRODUODENOSCOPY  2002   RMR: ulcerative reflux esophagitis, moderate sized hiatal hernia   HIP SURGERY Bilateral    as a child   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   Procedure: TOTAL HIP ARTHROPLASTY;  Surgeon: Carole Civil, MD;  Location: [facility];  Service: Orthopedics;  Laterality: Right;   WRIST SURGERY     Patient Active Problem List   Diagnosis Date Noted   Frequent bowel movements 06/29/2022   S/P hip replacement, right 05/16/22 05/31/2022   Hypokalemia 05/24/2022   Anemia 05/24/2022   Hospital discharge follow-up 05/24/2022   Subclinical hyperthyroidism 06/22/2021   Vitamin D deficiency 06/22/2021   Prediabetes 06/22/2021   Mixed hyperlipidemia 06/22/2021   Colon cancer screening 06/22/2021   Tobacco abuse 04/27/2021   HTN (hypertension) 04/27/2021   Peripheral polyneuropathy 04/27/2021   Gastroesophageal reflux disease 04/27/2021   Arthritis of right hip 04/27/2021   Alcohol abuse 12/19/2016   Substance abuse ([place]) 12/19/2016    PCP: Ihor Dow MD  REFERRING PROVIDER: Carole Civil, MD   REFERRING DIAG: M16.7 (ICD-10-CM) - Other secondary osteoarthritis of right hip    THERAPY DIAG:  Pain in right hip  Other symptoms and signs involving the musculoskeletal system  Muscle weakness (generalized)  Other abnormalities of gait and mobility  Rationale for Evaluation and Treatment Rehabilitation  ONSET DATE: 05/16/22  SUBJECTIVE:   SUBJECTIVE STATEMENT: 7/10 pain today; "really tight"  PERTINENT HISTORY: hypertension, right hip arthritis, peripheral neuropathy, s/p R THA on 05/16/22 with a direct lateral approach.    PAIN:  Are you having pain? Yes: NPRS scale: 5/10 Pain location: right hip Pain description: tight, sore Aggravating factors: stiff in the mornings Relieving factors: movement  PRECAUTIONS: Other: direct lateral hip  WEIGHT BEARING RESTRICTIONS No  FALLS:  Has patient fallen in last 6 months? 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  PATIENT GOALS get back to normal    OBJECTIVE:   PATIENT SURVEYS:  FOTO 56% function  COGNITION:  Overall cognitive status: Within functional limits for tasks assessed     SENSATION: WFL   POSTURE: No Significant postural limitations  PALPATION: Grossly tender lateral hip and proximal thigh  LOWER EXTREMITY ROM:  decreased hip extension with ambulation  LOWER EXTREMITY MMT:  MMT Right eval Left eval  Hip flexion 3+ 5  Hip extension  Hip abduction    Hip adduction    Hip internal rotation    Hip external rotation    Knee flexion 5 5  Knee extension 4+ 5  Ankle dorsiflexion 5 5  Ankle plantarflexion    Ankle inversion    Ankle eversion     (Blank rows = not tested)    FUNCTIONAL TESTS:  5 times sit to stand: 16.04 seconds with UE support, attempts without UE use but unable 2 minute walk test: 375 feet with SPC Transfers: labored, relies LLE, bilateral UE use Stairs: able to ascend with  alternating with bilateral UE support, step too pattern on descend  GAIT: Distance walked: 375 feet Assistive device utilized: Single point cane Level of assistance: Modified independence Comments: 2 MWT, antalgic, decreased hip extension     TODAY'S TREATMENT: 07/05/22 Nustep 5 minutes seat 12 level 5  Heel/toe raises 2 x 10 Slant board 5 x 20" 1/2 lunge on 8" step with trunk extension for quad stretch 5 x 20" each Hip vectors 3" x 10 each Lateral step ups 8" 2 x 10  Body craft walkouts 3 plates x 5 each way  Gait training without AD 1 lap around gym  Sit to stand x 10 no UE assist   07/03/22 Nustep 5 minutes seat 12 level 5  Heel/toe raises 2 x 10 Slant board 5 x 20" 6" lateral step ups 2 x 10 Hip vectors 3" x 8 each 1/2 lunge on 8" step with trunk extension for quad stretch 5 x 20" each Green thera band sidestepping blue line down and back x 3  Gait training without assistive device x 100 ft  5 times sit to stand 13 sec today no UE assist           06/26/22 Nustep 5 minutes seat 12 level 5  1/2 lunge on 6" step with trunk extension for quad stretch 5 x 20" each STS 3 x 10 Step ups 3 x 10 8" step Standing hip vectors standing on right leg 5x Heel raises/ toe raises x 20 Slant board 5 x 20" Standing on balance beam 2 x 30" looking up       06/21/22 HR/TR 1x 20  Step up 8 inch 2x 20 Lateral step up 8 inch 2x 10  STS 3x 10  Lateral step down 6 inch 2x 10  Standing hip flexor stretch at step 5 x 20 second holds SLS with vectors 5 x 5 second holds on RLE Nustep 5 minutes seat 12 level 5    06/19/22 HR/TR 1x 20  Alternating march 2x 10 bilateral  Step up 8 inch 2x 10  Lateral step up 6 inch 2x 10  Lateral step down 6 inch 1x 10  Mini squat 2x 10  STS with black foam 2 x 10  LAQ 5# 2x 10 5 second holds   06/14/22 Nustep 5 minutes seat 12 level 4 dynamic warm up  Gait training; Walk outdoors parking lot with SPC x 8 minutes up and down curb,  ramp and uneven surfaces  Gait training with SPC x 1 lap in gym focus on form  Standing: Heel/toe raises x 20 no UE assist Slant board 5 x 20" Marching  x 20 no UE assist 8" step ups x 12 focus on control TKE's green x 30 Tandem stance on foam beam 2 x 30" each Mini squats 2 x 10 Sit to stand no UE assist from chair x 5   Seated: LAQs  rt 5# 2 X 10 (sitting on foam for improved foot clearance)  PATIENT EDUCATION:  Education details: 06/07/22:  reviewed goals, demonstrated hip kit, discussed healthcare plan, scar massage instruction; eval: Patient educated on exam findings, POC, scope of PT, HEP, and hip precautions. Person educated: Patient Education method: Explanation, Demonstration, and Handouts Education comprehension: verbalized understanding, returned demonstration, verbal cues required, and tactile cues required  HOME EXERCISE PROGRAM: Access Code: [email or web address]  Date: 06/02/2022 - Supine Gluteal Sets  - 3 x daily - 7 x weekly - 10 reps - 10 second hold - Supine Bridge  - 3 x daily - 7 x weekly - 2 sets - 10 reps - Supine Heel Slide  - 3 x daily - 7 x weekly - 2 sets - 10 reps - Seated Long Arc Quad  - 3 x daily - 7 x weekly - 10 reps - 5-10 second hold  ASSESSMENT:  CLINICAL IMPRESSION: Today's session continued to focus on hip strengthening, balance,mobility and gait training. Patient with less lateral movement in trunk with ambulation without assistive device; less toe off on right side but improves with verbal cues. Better form without cane than with but encouraged to use when painful or on uneven ground or walking a long distance.  Added walkouts to challenge strength and balance.  Patient will continue to benefit from physical therapy in order to improve function and reduce impairment.   OBJECTIVE IMPAIRMENTS Abnormal gait, decreased activity tolerance, decreased balance, decreased endurance, decreased mobility, difficulty walking, decreased ROM, decreased strength,  increased muscle spasms, impaired flexibility, improper body mechanics, and pain.   ACTIVITY LIMITATIONS lifting, bending, standing, squatting, sleeping, stairs, transfers, locomotion level, and caring for others  PARTICIPATION LIMITATIONS: cleaning, laundry, driving, shopping, community activity, and yard work  [place] and 1-2 comorbidities: HLD, HTN, neuropathy  are also affecting patient's functional outcome.   REHAB POTENTIAL: Good  CLINICAL DECISION MAKING: Stable/uncomplicated  EVALUATION COMPLEXITY: Low   GOALS: Goals reviewed with patient? No  SHORT TERM GOALS: Target date: 06/23/2022  Patient will be independent with HEP in order to improve functional outcomes. Baseline:  Goal status: IN PROGRESS  2.  Patient will report at least 25% improvement in symptoms for improved quality of life. Baseline:  Goal status: IN PROGRESS   LONG TERM GOALS: Target date: 07/14/2022  Patient will report at least 75% improvement in symptoms for improved quality of life. Baseline:  Goal status: IN PROGRESS  2.  Patient will improve FOTO score by at least 20 points in order to indicate improved tolerance to activity. Baseline: 56% function Goal status: IN PROGRESS  3.  Patient will be able to navigate stairs with reciprocal pattern without compensation or UE use in order to demonstrate improved LE strength. Baseline: see above Goal status: IN PROGRESS  4.  Patient will be able to ambulate at least 500 feet in 2MWT with normal gait mechanics in order to demonstrate improved tolerance to activity. Baseline: 375 feet  Goal status: IN PROGRESS  5. Patient will demonstrate grade of 5/5 MMT grade in all tested musculature as evidence of improved strength to assist with stair ambulation and gait.   Baseline: see MMT Goal status: IN PROGRESS   PLAN: PT FREQUENCY: 2x/week  PT DURATION: 6 weeks  PLANNED INTERVENTIONS: Therapeutic exercises, Therapeutic activity,  Neuromuscular re-education, Balance training, Gait training, Patient/Family education, Joint manipulation, Joint mobilization, Stair training, Orthotic/Fit training, DME instructions, Aquatic Therapy, Dry Needling, Electrical stimulation, Spinal manipulation, Spinal mobilization, Cryotherapy, Moist heat, Compression  bandaging, scar mobilization, Splintting, Taping, Traction, Ultrasound, Ionotophoresis 23m/ml Dexamethasone, and Manual therapy  PLAN FOR NEXT SESSION: direct lateral hip precautions,  improve strength and mobility and progress as able   11:14 AM, 07/05/22 Shawn Dannenberg Small Alvaro Aungst MPT [facility] [facility] [place] #[phone]

## 2022-07-11 ENCOUNTER — Encounter (HOSPITAL_COMMUNITY): Payer: 59 | Admitting: Physical Therapy

## 2022-07-13 ENCOUNTER — Ambulatory Visit (HOSPITAL_COMMUNITY): Payer: 59 | Attending: Orthopedic Surgery

## 2022-07-13 ENCOUNTER — Encounter (HOSPITAL_COMMUNITY): Payer: 59 | Admitting: Physical Therapy

## 2022-07-13 DIAGNOSIS — M6281 Muscle weakness (generalized): Secondary | ICD-10-CM | POA: Diagnosis not present

## 2022-07-13 DIAGNOSIS — M25551 Pain in right hip: Secondary | ICD-10-CM | POA: Insufficient documentation

## 2022-07-13 DIAGNOSIS — R2689 Other abnormalities of gait and mobility: Secondary | ICD-10-CM | POA: Diagnosis not present

## 2022-07-13 DIAGNOSIS — R29898 Other symptoms and signs involving the musculoskeletal system: Secondary | ICD-10-CM | POA: Diagnosis not present

## 2022-07-13 NOTE — Therapy (Signed)
OUTPATIENT PHYSICAL THERAPY TREATMENT  Patient Name: David Knox MRN: [id number] DOB:08-23-1966, 56 y.o., male Today's Date: 07/13/2022   PT End of Session - 07/13/22 1558     Visit Number 11    Number of Visits 12    Date for PT Re-Evaluation 07/14/22    Authorization Type [employer] (30 vl PT, OT, ST)    Authorization - Visit Number 9    Authorization - Number of Visits 30    PT Start Time 2440    PT Stop Time 1640    PT Time Calculation (min) 42 min    Activity Tolerance Patient tolerated treatment well    Behavior During Therapy WFL for tasks assessed/performed                   Past Medical History:  Diagnosis Date   Acid reflux    HLD (hyperlipidemia)    HTN (hypertension)    Past Surgical History:  Procedure Laterality Date   ESOPHAGOGASTRODUODENOSCOPY  2002   RMR: ulcerative reflux esophagitis, moderate sized hiatal hernia   HIP SURGERY Bilateral    as a child   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   Procedure: TOTAL HIP ARTHROPLASTY;  Surgeon: Carole Civil, MD;  Location: [facility];  Service: Orthopedics;  Laterality: Right;   WRIST SURGERY     Patient Active Problem List   Diagnosis Date Noted   Frequent bowel movements 06/29/2022   S/P hip replacement, right 05/16/22 05/31/2022   Hypokalemia 05/24/2022   Anemia 05/24/2022   Hospital discharge follow-up 05/24/2022   Subclinical hyperthyroidism 06/22/2021   Vitamin D deficiency 06/22/2021   Prediabetes 06/22/2021   Mixed hyperlipidemia 06/22/2021   Colon cancer screening 06/22/2021   Tobacco abuse 04/27/2021   HTN (hypertension) 04/27/2021   Peripheral polyneuropathy 04/27/2021   Gastroesophageal reflux disease 04/27/2021   Arthritis of right hip 04/27/2021   Alcohol abuse 12/19/2016   Substance abuse ([place]) 12/19/2016    PCP: Ihor Dow MD  REFERRING PROVIDER: Carole Civil, MD   REFERRING DIAG: M16.7 (ICD-10-CM) - Other secondary osteoarthritis of right hip    THERAPY DIAG:  Other abnormalities of gait and mobility  Pain in right hip  Other symptoms and signs involving the musculoskeletal system  Muscle weakness (generalized)  Rationale for Evaluation and Treatment Rehabilitation  ONSET DATE: 05/16/22  SUBJECTIVE:   SUBJECTIVE STATEMENT: 40-50% better overall  PERTINENT HISTORY: hypertension, right hip arthritis, peripheral neuropathy, s/p R THA on 05/16/22 with a direct lateral approach.    PAIN:  Are you having pain? Yes: NPRS scale: 4/10 Pain location: right hip Pain description: tight, sore Aggravating factors: stiff in the mornings Relieving factors: movement  PRECAUTIONS: Other: direct lateral hip  WEIGHT BEARING RESTRICTIONS No  FALLS:  Has patient fallen in last 6 months? 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  PATIENT GOALS get back to normal    OBJECTIVE:   PATIENT SURVEYS:  FOTO 56% function  COGNITION:  Overall cognitive status: Within functional limits for tasks assessed     SENSATION: WFL   POSTURE: No Significant postural limitations  PALPATION: Grossly tender lateral hip and proximal thigh  LOWER EXTREMITY ROM:  decreased hip extension with ambulation  LOWER EXTREMITY MMT:  MMT Right eval Left eval Right 07/13/22  Hip flexion 3+ 5 4+  Hip  extension     Hip abduction     Hip adduction     Hip internal rotation     Hip external rotation     Knee flexion 5 5   Knee extension 4+ 5 5  Ankle dorsiflexion '5 5 5  ' Ankle plantarflexion     Ankle inversion     Ankle eversion      (Blank rows = not tested)    FUNCTIONAL TESTS:  5 times sit to stand: 16.04 seconds with UE support, attempts without UE use but unable 2 minute walk test: 375 feet with SPC Transfers: labored, relies LLE, bilateral UE use Stairs:  able to ascend with alternating with bilateral UE support, step too pattern on descend  GAIT: Distance walked: 375 feet Assistive device utilized: Single point cane Level of assistance: Modified independence Comments: 2 MWT, antalgic, decreased hip extension     TODAY'S TREATMENT: 07/13/22 5 times sit to stand 10.71 sec no UE assist 2 MWT 352 ft with SPC; without SPC 460 ft FOTO 50 %  Nustep 5 minutes seat 12 level 6 Hip vectors 3" hold x 10 Leg press 4 plates 2 x 10      0/73/71 Nustep 5 minutes seat 12 level 5  Heel/toe raises 2 x 10 Slant board 5 x 20" 1/2 lunge on 8" step with trunk extension for quad stretch 5 x 20" each Hip vectors 3" x 10 each Lateral step ups 8" 2 x 10  Body craft walkouts 3 plates x 5 each way  Gait training without AD 1 lap around gym  Sit to stand x 10 no UE assist   07/03/22 Nustep 5 minutes seat 12 level 5  Heel/toe raises 2 x 10 Slant board 5 x 20" 6" lateral step ups 2 x 10 Hip vectors 3" x 8 each 1/2 lunge on 8" step with trunk extension for quad stretch 5 x 20" each Green thera band sidestepping blue line down and back x 3  Gait training without assistive device x 100 ft  5 times sit to stand 13 sec today no UE assist           06/26/22 Nustep 5 minutes seat 12 level 5  1/2 lunge on 6" step with trunk extension for quad stretch 5 x 20" each STS 3 x 10 Step ups 3 x 10 8" step Standing hip vectors standing on right leg 5x Heel raises/ toe raises x 20 Slant board 5 x 20" Standing on balance beam 2 x 30" looking up       06/21/22 HR/TR 1x 20  Step up 8 inch 2x 20 Lateral step up 8 inch 2x 10  STS 3x 10  Lateral step down 6 inch 2x 10  Standing hip flexor stretch at step 5 x 20 second holds SLS with vectors 5 x 5 second holds on RLE Nustep 5 minutes seat 12 level 5    06/19/22 HR/TR 1x 20  Alternating march 2x 10 bilateral  Step up 8 inch 2x 10  Lateral step up 6 inch 2x 10  Lateral step down 6 inch 1x  10  Mini squat 2x 10  STS with black foam 2 x 10  LAQ 5# 2x 10 5 second holds   06/14/22 Nustep 5 minutes seat 12 level 4 dynamic warm up  Gait training; Walk outdoors parking lot with SPC x 8 minutes up and down curb, ramp and uneven surfaces  Gait training with SPC x 1 lap in  gym focus on form  Standing: Heel/toe raises x 20 no UE assist Slant board 5 x 20" Marching  x 20 no UE assist 8" step ups x 12 focus on control TKE's green x 30 Tandem stance on foam beam 2 x 30" each Mini squats 2 x 10 Sit to stand no UE assist from chair x 5   Seated: LAQs rt 5# 2 X 10 (sitting on foam for improved foot clearance)  PATIENT EDUCATION:  Education details: 06/07/22:  reviewed goals, demonstrated hip kit, discussed healthcare plan, scar massage instruction; eval: Patient educated on exam findings, POC, scope of PT, HEP, and hip precautions. Person educated: Patient Education method: Explanation, Demonstration, and Handouts Education comprehension: verbalized understanding, returned demonstration, verbal cues required, and tactile cues required  HOME EXERCISE PROGRAM: Access Code: [email or web address]  Date: 06/02/2022 - Supine Gluteal Sets  - 3 x daily - 7 x weekly - 10 reps - 10 second hold - Supine Bridge  - 3 x daily - 7 x weekly - 2 sets - 10 reps - Supine Heel Slide  - 3 x daily - 7 x weekly - 2 sets - 10 reps - Seated Long Arc Quad  - 3 x daily - 7 x weekly - 10 reps - 5-10 second hold  ASSESSMENT:  CLINICAL IMPRESSION: Progress note today. Improve score on the 5 x sit to stand test demonstrating increased lower extremity strength; 2 MWT demonstrating improved functional mobility/ community ambulation. Will request further PT to address remaining unmet and partially met goals.  Patient will continue to benefit from physical therapy in order to improve function and reduce impairment.   OBJECTIVE IMPAIRMENTS Abnormal gait, decreased activity tolerance, decreased balance, decreased endurance,  decreased mobility, difficulty walking, decreased ROM, decreased strength, increased muscle spasms, impaired flexibility, improper body mechanics, and pain.   ACTIVITY LIMITATIONS lifting, bending, standing, squatting, sleeping, stairs, transfers, locomotion level, and caring for others  PARTICIPATION LIMITATIONS: cleaning, laundry, driving, shopping, community activity, and yard work  [place] and 1-2 comorbidities: HLD, HTN, neuropathy  are also affecting patient's functional outcome.   REHAB POTENTIAL: Good  CLINICAL DECISION MAKING: Stable/uncomplicated  EVALUATION COMPLEXITY: Low   GOALS: Goals reviewed with patient? No  SHORT TERM GOALS: Target date: 06/23/2022  Patient will be independent with HEP in order to improve functional outcomes. Baseline:  Goal status: MET  2.  Patient will report at least 25% improvement in symptoms for improved quality of life. Baseline: 40-50% better Goal status: MET   LONG TERM GOALS: Target date: 07/14/2022  Patient will report at least 75% improvement in symptoms for improved quality of life. Baseline: 07/13/22 40-50% better Goal status: IN PROGRESS  2.  Patient will improve FOTO score by at least 20 points in order to indicate improved tolerance to activity. Baseline: 56% function; 07/13/22 50% Goal status: IN PROGRESS  3.  Patient will be able to navigate stairs with reciprocal pattern without compensation or UE use in order to demonstrate improved LE strength. Baseline: see above Goal status: IN PROGRESS  4.  Patient will be able to ambulate at least 500 feet in 2MWT with normal gait mechanics in order to demonstrate improved tolerance to activity. Baseline: 375 feet; 07/13/22 ; 07/13/22 460 ft without AD Goal status: IN PROGRESS  5. Patient will demonstrate grade of 5/5 MMT grade in all tested musculature as evidence of improved strength to assist with stair ambulation and gait.   Baseline: see MMT Goal status: IN  PROGRESS  PLAN: PT FREQUENCY: 2x/week  PT DURATION: 6 weeks  PLANNED INTERVENTIONS: Therapeutic exercises, Therapeutic activity, Neuromuscular re-education, Balance training, Gait training, Patient/Family education, Joint manipulation, Joint mobilization, Stair training, Orthotic/Fit training, DME instructions, Aquatic Therapy, Dry Needling, Electrical stimulation, Spinal manipulation, Spinal mobilization, Cryotherapy, Moist heat, Compression bandaging, scar mobilization, Splintting, Taping, Traction, Ultrasound, Ionotophoresis 10m/ml Dexamethasone, and Manual therapy  PLAN FOR NEXT SESSION: extend PT 2 x a week for 4 weeks to address remaining unmet and partially met goals   4:26 PM, 07/13/22 Yanci Bachtell Small Clotiel Troop MPT [facility] [place] #[phone]

## 2022-07-17 ENCOUNTER — Other Ambulatory Visit: Payer: Self-pay | Admitting: Internal Medicine

## 2022-07-17 ENCOUNTER — Encounter (HOSPITAL_COMMUNITY): Payer: 59 | Admitting: Physical Therapy

## 2022-07-17 DIAGNOSIS — I10 Essential (primary) hypertension: Secondary | ICD-10-CM

## 2022-07-19 DIAGNOSIS — R194 Change in bowel habit: Secondary | ICD-10-CM | POA: Diagnosis not present

## 2022-07-20 ENCOUNTER — Ambulatory Visit (HOSPITAL_COMMUNITY): Payer: 59

## 2022-07-20 ENCOUNTER — Encounter (HOSPITAL_COMMUNITY): Payer: Self-pay

## 2022-07-20 DIAGNOSIS — M25551 Pain in right hip: Secondary | ICD-10-CM

## 2022-07-20 DIAGNOSIS — R29898 Other symptoms and signs involving the musculoskeletal system: Secondary | ICD-10-CM | POA: Diagnosis not present

## 2022-07-20 DIAGNOSIS — R2689 Other abnormalities of gait and mobility: Secondary | ICD-10-CM | POA: Diagnosis not present

## 2022-07-20 DIAGNOSIS — M6281 Muscle weakness (generalized): Secondary | ICD-10-CM | POA: Diagnosis not present

## 2022-07-20 LAB — IGA: Immunoglobulin A: 436 mg/dL — ABNORMAL HIGH (ref 47–310)

## 2022-07-20 LAB — TISSUE TRANSGLUTAMINASE, IGA: (tTG) Ab, IgA: <1 U/mL

## 2022-07-20 NOTE — Therapy (Signed)
OUTPATIENT PHYSICAL THERAPY TREATMENT  Patient Name: David Knox MRN: [id number] DOB:05-Nov-1966, 56 y.o., male Today's Date: 07/20/2022   PT End of Session - 07/20/22 1033     Visit Number 12    Number of Visits 15    Date for PT Re-Evaluation 07/26/22    Authorization Type [employer] (30 vl PT, OT, ST)    Authorization - Visit Number 12    Authorization - Number of Visits 30    PT Start Time [phone]   late arrival   PT Stop Time 1030    PT Time Calculation (min) 36 min    Activity Tolerance Patient tolerated treatment well    Behavior During Therapy [facility] for tasks assessed/performed                    Past Medical History:  Diagnosis Date   Acid reflux    HLD (hyperlipidemia)    HTN (hypertension)    Past Surgical History:  Procedure Laterality Date   ESOPHAGOGASTRODUODENOSCOPY  2002   RMR: ulcerative reflux esophagitis, moderate sized hiatal hernia   HIP SURGERY Bilateral    as a child   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   Procedure: TOTAL HIP ARTHROPLASTY;  Surgeon: Carole Civil, MD;  Location: [facility];  Service: Orthopedics;  Laterality: Right;   WRIST SURGERY     Patient Active Problem List   Diagnosis Date Noted   Frequent bowel movements 06/29/2022   S/P hip replacement, right 05/16/22 05/31/2022   Hypokalemia 05/24/2022   Anemia 05/24/2022   Hospital discharge follow-up 05/24/2022   Subclinical hyperthyroidism 06/22/2021   Vitamin D deficiency 06/22/2021   Prediabetes 06/22/2021   Mixed hyperlipidemia 06/22/2021   Colon cancer screening 06/22/2021   Tobacco abuse 04/27/2021   HTN (hypertension) 04/27/2021   Peripheral polyneuropathy 04/27/2021   Gastroesophageal reflux disease 04/27/2021   Arthritis of right hip 04/27/2021   Alcohol abuse 12/19/2016   Substance abuse ([place]) 12/19/2016    PCP: Ihor Dow MD  REFERRING PROVIDER: Carole Civil, MD   REFERRING DIAG: M16.7 (ICD-10-CM) - Other secondary  osteoarthritis of right hip   THERAPY DIAG:  Other abnormalities of gait and mobility  Pain in right hip  Other symptoms and signs involving the musculoskeletal system  Muscle weakness (generalized)  Rationale for Evaluation and Treatment Rehabilitation  ONSET DATE: 05/16/22  SUBJECTIVE:   SUBJECTIVE STATEMENT: Hip pain scale 5/10.  Feels he has make great improvements with therapy.  Feels we need to work on strength and some balance activities today.  PERTINENT HISTORY: hypertension, right hip arthritis, peripheral neuropathy, s/p R THA on 05/16/22 with a direct lateral approach.    PAIN:  Are you having pain? Yes: NPRS scale: 5/10 Pain location: right hip Pain description: tight, sore Aggravating factors: stiff in the mornings Relieving factors: movement  PRECAUTIONS: Other: direct lateral hip  WEIGHT BEARING RESTRICTIONS No  FALLS:  Has patient fallen in last 6 months? 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  PATIENT GOALS get back to normal    OBJECTIVE:   PATIENT SURVEYS:  FOTO 56% function  COGNITION:  Overall cognitive status: Within functional limits for tasks assessed     SENSATION: WFL   POSTURE: No Significant postural limitations  PALPATION: Grossly tender lateral hip and proximal thigh  LOWER EXTREMITY  ROM:  decreased hip extension with ambulation  LOWER EXTREMITY MMT:  MMT Right eval Left eval Right 07/13/22  Hip flexion 3+ 5 4+  Hip extension     Hip abduction     Hip adduction     Hip internal rotation     Hip external rotation     Knee flexion 5 5   Knee extension 4+ 5 5  Ankle dorsiflexion '5 5 5  ' Ankle plantarflexion     Ankle inversion     Ankle eversion      (Blank rows = not tested)    FUNCTIONAL TESTS:  5 times sit to stand: 16.04 seconds  with UE support, attempts without UE use but unable 2 minute walk test: 375 feet with SPC Transfers: labored, relies LLE, bilateral UE use Stairs: able to ascend with alternating with bilateral UE support, step too pattern on descend  GAIT: Distance walked: 375 feet Assistive device utilized: Single point cane Level of assistance: Modified independence Comments: 2 MWT, antalgic, decreased hip extension     TODAY'S TREATMENT: 07/20/22 Rockerboard lateral 2 min with minimal HHA Squat front of chair with minimal HHA Leg press 5 plates 2 x 10 Walkout 4PL 5RT Used hand to simulate walking with dog retro/forward gait; around waist for sidesteps  Vector stance 5x 5" with 1 HHA  Given handout for [employer]   07/13/22 5 times sit to stand 10.71 sec no UE assist 2 MWT 352 ft with SPC; without SPC 460 ft FOTO 50 %  Nustep 5 minutes seat 12 level 6 Hip vectors 3" hold x 10 Leg press 4 plates 2 x 10      01/19/16 Nustep 5 minutes seat 12 level 5  Heel/toe raises 2 x 10 Slant board 5 x 20" 1/2 lunge on 8" step with trunk extension for quad stretch 5 x 20" each Hip vectors 3" x 10 each Lateral step ups 8" 2 x 10  Body craft walkouts 3 plates x 5 each way  Gait training without AD 1 lap around gym  Sit to stand x 10 no UE assist   07/03/22 Nustep 5 minutes seat 12 level 5  Heel/toe raises 2 x 10 Slant board 5 x 20" 6" lateral step ups 2 x 10 Hip vectors 3" x 8 each 1/2 lunge on 8" step with trunk extension for quad stretch 5 x 20" each Green thera band sidestepping blue line down and back x 3  Gait training without assistive device x 100 ft  5 times sit to stand 13 sec today no UE assist           06/26/22 Nustep 5 minutes seat 12 level 5  1/2 lunge on 6" step with trunk extension for quad stretch 5 x 20" each STS 3 x 10 Step ups 3 x 10 8" step Standing hip vectors standing on right leg 5x Heel raises/ toe raises x 20 Slant board 5 x 20" Standing on  balance beam 2 x 30" looking up       06/21/22 HR/TR 1x 20  Step up 8 inch 2x 20 Lateral step up 8 inch 2x 10  STS 3x 10  Lateral step down 6 inch 2x 10  Standing hip flexor stretch at step 5 x 20 second holds SLS with vectors 5 x 5 second holds on RLE Nustep 5 minutes seat 12 level 5    06/19/22 HR/TR 1x 20  Alternating march 2x 10 bilateral  Step up 8 inch  2x 10  Lateral step up 6 inch 2x 10  Lateral step down 6 inch 1x 10  Mini squat 2x 10  STS with black foam 2 x 10  LAQ 5# 2x 10 5 second holds   06/14/22 Nustep 5 minutes seat 12 level 4 dynamic warm up  Gait training; Walk outdoors parking lot with SPC x 8 minutes up and down curb, ramp and uneven surfaces  Gait training with SPC x 1 lap in gym focus on form  Standing: Heel/toe raises x 20 no UE assist Slant board 5 x 20" Marching  x 20 no UE assist 8" step ups x 12 focus on control TKE's green x 30 Tandem stance on foam beam 2 x 30" each Mini squats 2 x 10 Sit to stand no UE assist from chair x 5   Seated: LAQs rt 5# 2 X 10 (sitting on foam for improved foot clearance)  PATIENT EDUCATION:  Education details: 06/07/22:  reviewed goals, demonstrated hip kit, discussed healthcare plan, scar massage instruction; eval: Patient educated on exam findings, POC, scope of PT, HEP, and hip precautions. Person educated: Patient Education method: Explanation, Demonstration, and Handouts Education comprehension: verbalized understanding, returned demonstration, verbal cues required, and tactile cues required  HOME EXERCISE PROGRAM: Access Code: [email or web address]  Date: 06/02/2022 - Supine Gluteal Sets  - 3 x daily - 7 x weekly - 10 reps - 10 second hold - Supine Bridge  - 3 x daily - 7 x weekly - 2 sets - 10 reps - Supine Heel Slide  - 3 x daily - 7 x weekly - 2 sets - 10 reps - Seated Long Arc Quad  - 3 x daily - 7 x weekly - 10 reps - 5-10 second hold  ASSESSMENT:  CLINICAL IMPRESSION: Pt arrived with antalgic gait  mechanics at entrance, added rockerboard to improve weight bearing with gait.  Pt reports he has swelling around knee, educated benefits of compression garments.  Pt stated he has a pair but not sure how old they are and reports so difficulty donning.  Pt educated to change every 6 months, measurements taken for ETI and shown butler to increase ease with donning.  Session focus with functional strengthening and balance training.  Added retro pulling and controlled forward walking with [employer]craft walk out activities to simulate walking dogs, presents with good control.  Able to increase weight with body walkout and leg press machines with good control noted.  No reports of increased pain through session.    OBJECTIVE IMPAIRMENTS Abnormal gait, decreased activity tolerance, decreased balance, decreased endurance, decreased mobility, difficulty walking, decreased ROM, decreased strength, increased muscle spasms, impaired flexibility, improper body mechanics, and pain.   ACTIVITY LIMITATIONS lifting, bending, standing, squatting, sleeping, stairs, transfers, locomotion level, and caring for others  PARTICIPATION LIMITATIONS: cleaning, laundry, driving, shopping, community activity, and yard work  [place] and 1-2 comorbidities: HLD, HTN, neuropathy  are also affecting patient's functional outcome.   REHAB POTENTIAL: Good  CLINICAL DECISION MAKING: Stable/uncomplicated  EVALUATION COMPLEXITY: Low   GOALS: Goals reviewed with patient? No  SHORT TERM GOALS: Target date: 06/23/2022  Patient will be independent with HEP in order to improve functional outcomes. Baseline:  Goal status: MET  2.  Patient will report at least 25% improvement in symptoms for improved quality of life. Baseline: 40-50% better Goal status: MET   LONG TERM GOALS: Target date: 07/14/2022  Patient will report at least 75% improvement in symptoms for improved quality of life.  Baseline: 07/13/22 40-50%  better Goal status: IN PROGRESS  2.  Patient will improve FOTO score by at least 20 points in order to indicate improved tolerance to activity. Baseline: 56% function; 07/13/22 50% Goal status: IN PROGRESS  3.  Patient will be able to navigate stairs with reciprocal pattern without compensation or UE use in order to demonstrate improved LE strength. Baseline: see above Goal status: IN PROGRESS  4.  Patient will be able to ambulate at least 500 feet in 2MWT with normal gait mechanics in order to demonstrate improved tolerance to activity. Baseline: 375 feet; 07/13/22 ; 07/13/22 460 ft without AD Goal status: IN PROGRESS  5. Patient will demonstrate grade of 5/5 MMT grade in all tested musculature as evidence of improved strength to assist with stair ambulation and gait.   Baseline: see MMT Goal status: IN PROGRESS   PLAN: PT FREQUENCY: 2x/week  PT DURATION: 6 weeks  PLANNED INTERVENTIONS: Therapeutic exercises, Therapeutic activity, Neuromuscular re-education, Balance training, Gait training, Patient/Family education, Joint manipulation, Joint mobilization, Stair training, Orthotic/Fit training, DME instructions, Aquatic Therapy, Dry Needling, Electrical stimulation, Spinal manipulation, Spinal mobilization, Cryotherapy, Moist heat, Compression bandaging, scar mobilization, Splintting, Taping, Traction, Ultrasound, Ionotophoresis 60m/ml Dexamethasone, and Manual therapy  PLAN FOR NEXT SESSION: extend PT 2 x a week for 4 weeks to address remaining unmet and partially met goals  CIhor Austin LPTA/CLT; CBIS [phone] 3:03 PM, 07/20/22

## 2022-07-21 ENCOUNTER — Encounter (HOSPITAL_COMMUNITY)
Admission: RE | Admit: 2022-07-21 | Discharge: 2022-07-21 | Disposition: A | Discharge status: Home / Self Care | Payer: 59 | Source: Ambulatory Visit | Attending: Internal Medicine | Admitting: Internal Medicine

## 2022-07-24 ENCOUNTER — Encounter (HOSPITAL_COMMUNITY): Payer: 59

## 2022-07-25 ENCOUNTER — Telehealth: Payer: Self-pay | Admitting: *Deleted

## 2022-07-25 NOTE — Telephone Encounter (Signed)
Spoke to pt, he informed me that he will check on medications.

## 2022-07-25 NOTE — Telephone Encounter (Signed)
I sent 3 refills on his original Rx that was sent on 8/24. The pharmacy should have this on file. He should reach out to them to get his medication filled. Let us know if there is any issue with this.

## 2022-07-25 NOTE — Telephone Encounter (Signed)
Pt called and states he is out of Pantoprazole 20mg . Needs refill sent to [facility] in [place]. Last OV was 06/29/2022

## 2022-07-26 ENCOUNTER — Encounter: Payer: Self-pay | Admitting: Orthopedic Surgery

## 2022-07-26 ENCOUNTER — Ambulatory Visit (INDEPENDENT_AMBULATORY_CARE_PROVIDER_SITE_OTHER): Payer: 59 | Admitting: Orthopedic Surgery

## 2022-07-26 DIAGNOSIS — Z96641 Presence of right artificial hip joint: Secondary | ICD-10-CM

## 2022-07-26 NOTE — Patient Instructions (Signed)
Extended OOW till NOV 1

## 2022-07-26 NOTE — Progress Notes (Signed)
FOLLOW UP   Encounter Diagnosis  Name Primary?   S/P hip replacement, right 05/16/22 Yes     Chief Complaint  Patient presents with   Post-op Follow-up    Right hip replaced 05/16/22 has improved, doing well   Allergies  Allergen Reactions   Aspirin Nausea Only   Current Outpatient Medications  Medication Instructions   acetaminophen (TYLENOL) 1,000 mg, Oral, Every 6 hours   amLODipine (NORVASC) 5 mg, Oral, Daily   atorvastatin (LIPITOR) 20 mg, Oral, Daily   gabapentin (NEURONTIN) 300 mg, Oral, 2 times daily   naproxen (NAPROSYN) 500 mg, Oral, 2 times daily with meals   pantoprazole (PROTONIX) 20 mg, Oral, Daily before breakfast   telmisartan (MICARDIS) 40 mg, Oral, Daily     Mr. Plant is doing well he is progressing well.  He stopped using his cane at therapist suggestion because he was depending on it too much  He is concerned about going to work at [employer] because he has to do a lot of bending and squatting  He will miss a few therapy appointments because they do not have room to get him in until October.  He is walking about 400 yards a day at his home and is doing his home exercises  His pain is under control with only occasional discomfort here and there  Recommend he stay out of work until November 1 be reevaluated October 31 after he had a month of therapy.

## 2022-07-27 ENCOUNTER — Encounter (HOSPITAL_COMMUNITY): Payer: Self-pay

## 2022-07-27 ENCOUNTER — Encounter (HOSPITAL_COMMUNITY)
Admission: RE | Disposition: A | Discharge status: Home / Self Care | Payer: Self-pay | Source: Home / Self Care | Attending: Internal Medicine

## 2022-07-27 ENCOUNTER — Ambulatory Visit (HOSPITAL_COMMUNITY): Payer: 59 | Admitting: Anesthesiology

## 2022-07-27 ENCOUNTER — Ambulatory Visit (HOSPITAL_COMMUNITY)
Admission: RE | Admit: 2022-07-27 | Discharge: 2022-07-27 | Disposition: A | Discharge status: Home / Self Care | Payer: 59 | Attending: Internal Medicine | Admitting: Internal Medicine

## 2022-07-27 ENCOUNTER — Ambulatory Visit (HOSPITAL_BASED_OUTPATIENT_CLINIC_OR_DEPARTMENT_OTHER): Payer: 59 | Admitting: Anesthesiology

## 2022-07-27 DIAGNOSIS — I10 Essential (primary) hypertension: Secondary | ICD-10-CM | POA: Diagnosis not present

## 2022-07-27 DIAGNOSIS — F17210 Nicotine dependence, cigarettes, uncomplicated: Secondary | ICD-10-CM | POA: Diagnosis not present

## 2022-07-27 DIAGNOSIS — K635 Polyp of colon: Secondary | ICD-10-CM | POA: Diagnosis not present

## 2022-07-27 DIAGNOSIS — Z1211 Encounter for screening for malignant neoplasm of colon: Secondary | ICD-10-CM | POA: Insufficient documentation

## 2022-07-27 DIAGNOSIS — D759 Disease of blood and blood-forming organs, unspecified: Secondary | ICD-10-CM | POA: Insufficient documentation

## 2022-07-27 DIAGNOSIS — K219 Gastro-esophageal reflux disease without esophagitis: Secondary | ICD-10-CM | POA: Insufficient documentation

## 2022-07-27 DIAGNOSIS — D649 Anemia, unspecified: Secondary | ICD-10-CM | POA: Insufficient documentation

## 2022-07-27 DIAGNOSIS — K5730 Diverticulosis of large intestine without perforation or abscess without bleeding: Secondary | ICD-10-CM | POA: Diagnosis not present

## 2022-07-27 DIAGNOSIS — Z1212 Encounter for screening for malignant neoplasm of rectum: Secondary | ICD-10-CM | POA: Diagnosis not present

## 2022-07-27 DIAGNOSIS — G709 Myoneural disorder, unspecified: Secondary | ICD-10-CM | POA: Diagnosis not present

## 2022-07-27 DIAGNOSIS — R69 Illness, unspecified: Secondary | ICD-10-CM | POA: Diagnosis not present

## 2022-07-27 HISTORY — PX: POLYPECTOMY: SHX5525

## 2022-07-27 HISTORY — PX: COLONOSCOPY WITH PROPOFOL: SHX5780

## 2022-07-27 SURGERY — COLONOSCOPY WITH PROPOFOL
Anesthesia: General

## 2022-07-27 MED ORDER — LACTATED RINGERS IV SOLN: INTRAVENOUS | Status: DC

## 2022-07-27 MED ORDER — CHLORHEXIDINE GLUCONATE 0.12 % MT SOLN
15.0000 mL | Freq: Once | OROMUCOSAL | Status: DC
  Filled 2022-07-27: qty 15

## 2022-07-27 MED ORDER — LIDOCAINE HCL (CARDIAC) PF 100 MG/5ML IV SOSY
PREFILLED_SYRINGE | INTRAVENOUS | Status: DC | PRN
  Administered 2022-07-27: 60 mg via INTRATRACHEAL

## 2022-07-27 MED ORDER — PROPOFOL 500 MG/50ML IV EMUL
INTRAVENOUS | Status: DC | PRN
  Administered 2022-07-27: 75 ug/kg/min via INTRAVENOUS

## 2022-07-27 MED ORDER — ORAL CARE MOUTH RINSE: 15.0000 mL | Freq: Once | OROMUCOSAL | Status: DC

## 2022-07-27 MED ORDER — LACTATED RINGERS IV SOLN: INTRAVENOUS | Status: DC | PRN

## 2022-07-27 MED ORDER — PROPOFOL 10 MG/ML IV BOLUS
INTRAVENOUS | Status: DC | PRN
  Administered 2022-07-27: 100 mg via INTRAVENOUS

## 2022-07-27 NOTE — Transfer of Care (Signed)
Immediate Anesthesia Transfer of Care Note  Patient: David Knox  Procedure(s) Performed: COLONOSCOPY WITH PROPOFOL POLYPECTOMY  Patient Location: Short Stay  Anesthesia Type:General  Level of Consciousness: sedated  Airway & Oxygen Therapy: Patient Spontanous Breathing  Post-op Assessment: Report given to RN and Post -op Vital signs reviewed and stable  Post vital signs: Reviewed and stable  Last Vitals:  Vitals Value Taken Time  BP 111/78 07/27/22 0921  Temp 36.6 C 07/27/22 0921  Pulse 84 07/27/22 0921  Resp 20 07/27/22 0921  SpO2 96 % 07/27/22 0921    Last Pain:  Vitals:   07/27/22 0921  TempSrc:   PainSc: 0-No pain         Complications: No notable events documented.

## 2022-07-27 NOTE — Discharge Instructions (Signed)
  Colonoscopy Discharge Instructions  Read the instructions outlined below and refer to this sheet in the next few weeks. These discharge instructions provide you with general information on caring for yourself after you leave the hospital. Your doctor may also give you specific instructions. While your treatment has been planned according to the most current medical practices available, unavoidable complications occasionally occur.   ACTIVITY You may resume your regular activity, but move at a slower pace for the next 24 hours.  Take frequent rest periods for the next 24 hours.  Walking will help get rid of the air and reduce the bloated feeling in your belly (abdomen).  No driving for 24 hours (because of the medicine (anesthesia) used during the test).   Do not sign any important legal documents or operate any machinery for 24 hours (because of the anesthesia used during the test).  NUTRITION Drink plenty of fluids.  You may resume your normal diet as instructed by your doctor.  Begin with a light meal and progress to your normal diet. Heavy or fried foods are harder to digest and may make you feel sick to your stomach (nauseated).  Avoid alcoholic beverages for 24 hours or as instructed.  MEDICATIONS You may resume your normal medications unless your doctor tells you otherwise.  WHAT YOU CAN EXPECT TODAY Some feelings of bloating in the abdomen.  Passage of more gas than usual.  Spotting of blood in your stool or on the toilet paper.  IF YOU HAD POLYPS REMOVED DURING THE COLONOSCOPY: No aspirin products for 7 days or as instructed.  No alcohol for 7 days or as instructed.  Eat a soft diet for the next 24 hours.  FINDING OUT THE RESULTS OF YOUR TEST Not all test results are available during your visit. If your test results are not back during the visit, make an appointment with your caregiver to find out the results. Do not assume everything is normal if you have not heard from your  caregiver or the medical facility. It is important for you to follow up on all of your test results.  SEEK IMMEDIATE MEDICAL ATTENTION IF: You have more than a spotting of blood in your stool.  Your belly is swollen (abdominal distention).  You are nauseated or vomiting.  You have a temperature over 101.  You have abdominal pain or discomfort that is severe or gets worse throughout the day.   Your colonoscopy revealed 1 polyp(s) which I removed successfully. Await pathology results, my office will contact you. I recommend repeating colonoscopy in 5-10 years for surveillance purposes depending on pathology results  You also have diverticulosis and internal hemorrhoids. I would recommend increasing fiber in your diet or adding OTC Benefiber/Metamucil. Be sure to drink at least 4 to 6 glasses of water daily. Follow-up with GI in 3-4 months   I hope you have a great rest of your week!  Elon Alas. Abbey Chatters, D.O. Gastroenterology and Hepatology [facility]

## 2022-07-27 NOTE — Anesthesia Postprocedure Evaluation (Signed)
Anesthesia Post Note  Patient: David Knox  Procedure(s) Performed: COLONOSCOPY WITH PROPOFOL POLYPECTOMY  Patient location during evaluation: Phase II Anesthesia Type: General Level of consciousness: awake Pain management: pain level controlled Vital Signs Assessment: post-procedure vital signs reviewed and stable Respiratory status: spontaneous breathing and respiratory function stable Cardiovascular status: blood pressure returned to baseline and stable Postop Assessment: no headache and no apparent nausea or vomiting Anesthetic complications: no Comments: Late entry   No notable events documented.   Last Vitals:  Vitals:   07/27/22 0757 07/27/22 0921  BP: (!) 130/91 111/78  Pulse: 76 84  Resp: 16 20  Temp: 36.7 C 36.6 C  SpO2: 97% 96%    Last Pain:  Vitals:   07/27/22 0921  TempSrc:   PainSc: 0-No pain                 Louann Sjogren

## 2022-07-27 NOTE — Op Note (Signed)
[facility] Patient Name: David Knox Procedure Date: 07/27/2022 8:59 AM MRN: [id number] Date of Birth: [birth year] Attending MD: Elon Alas. Abbey Chatters DO CSN: [id number] Age: 56 Admit Type: Outpatient Procedure:                Colonoscopy Indications:              Screening for colorectal malignant neoplasm Providers:                Elon Alas. Abbey Chatters, DO, Lambert Mody, Angela A.                            Theda Sers RN, RN Referring MD:              Medicines:                See the Anesthesia note for documentation of the                            administered medications Complications:            No immediate complications. Estimated Blood Loss:     Estimated blood loss was minimal. Procedure:                Pre-Anesthesia Assessment:                           - The anesthesia plan was to use monitored                            anesthesia care (MAC).                           After obtaining informed consent, the colonoscope                            was passed under direct vision. Throughout the                            procedure, the patient's blood pressure, pulse, and                            oxygen saturations were monitored continuously. The                            PCF-HQ190L ([id number]) scope was introduced through                            the anus and advanced to the the cecum, identified                            by appendiceal orifice and ileocecal valve. The                            colonoscopy was performed without difficulty. The                            patient tolerated the procedure well.  The quality                            of the bowel preparation was evaluated using the                            BBPS [facility] Bowel Preparation Scale) with scores                            of: Right Colon = 3, Transverse Colon = 3 and Left                            Colon = 3 (entire mucosa seen well with no residual                            staining,  small fragments of stool or opaque                            liquid). The total BBPS score equals 9. Scope In: 9:09:27 AM Scope Out: 9:19:20 AM Scope Withdrawal Time: 0 hours 7 minutes 46 seconds  Total Procedure Duration: 0 hours 9 minutes 53 seconds  Findings:      The perianal and digital rectal examinations were normal.      Multiple medium-mouthed diverticula were found in the transverse colon       and ascending colon.      A 4 mm polyp was found in the descending colon. The polyp was sessile.       The polyp was removed with a cold snare. Resection and retrieval were       complete.      The exam was otherwise without abnormality. Impression:               - Diverticulosis in the transverse colon and in the                            ascending colon.                           - One 4 mm polyp in the descending colon, removed                            with a cold snare. Resected and retrieved.                           - The examination was otherwise normal. Moderate Sedation:      Per Anesthesia Care Recommendation:           - Patient has a contact number available for                            emergencies. The signs and symptoms of potential                            delayed complications were discussed with the  patient. Return to normal activities tomorrow.                            Written discharge instructions were provided to the                            patient.                           - Resume previous diet.                           - Continue present medications.                           - Await pathology results.                           - Repeat colonoscopy in 5-10 years for surveillance.                           - Return to GI clinic in 3 months. Procedure Code(s):        --- Professional ---                           [phone], Colonoscopy, flexible; with removal of                            tumor(s), polyp(s), or other  lesion(s) by snare                            technique Diagnosis Code(s):        --- Professional ---                           Z12.11, Encounter for screening for malignant                            neoplasm of colon                           K63.5, Polyp of colon                           K57.30, Diverticulosis of large intestine without                            perforation or abscess without bleeding CPT copyright 2019 American Medical Association. All rights reserved. The codes documented in this report are preliminary and upon coder review may  be revised to meet current compliance requirements. Elon Alas. Abbey Chatters, DO [place] Abbey Chatters, DO 07/27/2022 9:22:20 AM This report has been signed electronically. Number of Addenda: 0

## 2022-07-27 NOTE — H&P (Signed)
Primary Care Physician:  Lindell Spar, MD Primary Gastroenterologist:  Dr. Abbey Chatters  Pre-Procedure History & Physical: HPI:  David Knox is a 56 y.o. male is here for first ever colonoscopy for colon cancer screening purposes.   Past Medical History:  Diagnosis Date   Acid reflux    HLD (hyperlipidemia)    HTN (hypertension)     Past Surgical History:  Procedure Laterality Date   ESOPHAGOGASTRODUODENOSCOPY  2002   RMR: ulcerative reflux esophagitis, moderate sized hiatal hernia   HIP SURGERY Bilateral    as a child   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   Procedure: TOTAL HIP ARTHROPLASTY;  Surgeon: Carole Civil, MD;  Location: [facility];  Service: Orthopedics;  Laterality: Right;   WRIST SURGERY      Prior to Admission medications   Medication Sig Start Date End Date Taking? Authorizing Provider  acetaminophen (TYLENOL) 500 MG tablet Take 2 tablets (1,000 mg total) by mouth every 6 (six) hours. 05/17/22  Yes Carole Civil, MD  amLODipine (NORVASC) 5 MG tablet Take 1 tablet (5 mg total) by mouth daily. 06/16/22  Yes Lindell Spar, MD  atorvastatin (LIPITOR) 20 MG tablet Take 1 tablet (20 mg total) by mouth daily. 06/16/22  Yes Lindell Spar, MD  gabapentin (NEURONTIN) 300 MG capsule Take 1 capsule (300 mg total) by mouth 2 (two) times daily. 05/01/22  Yes Lindell Spar, MD  naproxen (NAPROSYN) 500 MG tablet TAKE 1 TABLET BY MOUTH 2 TIMES DAILY WITH A MEAL. 06/12/22  Yes Carole Civil, MD  pantoprazole (PROTONIX) 20 MG tablet Take 1 tablet (20 mg total) by mouth daily before breakfast. 06/29/22  Yes Jodi Mourning, Kristen S, PA-C  telmisartan (MICARDIS) 40 MG tablet Take 1 tablet (40 mg total) by mouth daily. 06/16/22  Yes Lindell Spar, MD    Allergies as of 06/29/2022 - Review Complete 06/29/2022  Allergen Reaction Noted   Aspirin Nausea Only 05/18/2022    Family History  Problem Relation Age of Onset   Diabetes Mother    Heart disease Mother    Diabetes  Father    Heart disease Father    Colon cancer Neg Hx     Social History   Socioeconomic History   Marital status: Single    Spouse name: Not on file   Number of children: Not on file   Years of education: Not on file   Highest education level: Not on file  Occupational History   Not on file  Tobacco Use   Smoking status: Every Day    Packs/day: 0.50    Types: Cigarettes   Smokeless tobacco: Never  Vaping Use   Vaping Use: Never used  Substance and Sexual Activity   Alcohol use: Yes    Comment: beer and liquor twice weekly   Drug use: No    Comment: "crack" former- last about 3 years ago.   Sexual activity: Not on file  Other Topics Concern   Not on file  Social History Narrative   Not on file   Social Determinants of Health   Financial Resource Strain: Not on file  Food Insecurity: Not on file  Transportation Needs: Not on file  Physical Activity: Not on file  Stress: Not on file  Social Connections: Not on file  Intimate Partner Violence: Not on file    Review of Systems: See HPI, otherwise negative ROS  Physical Exam: Vital signs in last 24 hours: Temp:  [98 F (36.7 C)]  98 F (36.7 C) (09/21 0757) Pulse Rate:  [76] 76 (09/21 0757) Resp:  [16] 16 (09/21 0757) BP: (130)/(91) 130/91 (09/21 0757) SpO2:  [97 %] 97 % (09/21 0757) Weight:  [83.5 kg] 83.5 kg (09/21 0757)   General:   Alert,  Well-developed, well-nourished, pleasant and cooperative in NAD Head:  Normocephalic and atraumatic. Eyes:  Sclera clear, no icterus.   Conjunctiva pink. Ears:  Normal auditory acuity. Nose:  No deformity, discharge,  or lesions. Mouth:  No deformity or lesions, dentition normal. Neck:  Supple; no masses or thyromegaly. Lungs:  Clear throughout to auscultation.   No wheezes, crackles, or rhonchi. No acute distress. Heart:  Regular rate and rhythm; no murmurs, clicks, rubs,  or gallops. Abdomen:  Soft, nontender and nondistended. No masses, hepatosplenomegaly or  hernias noted. Normal bowel sounds, without guarding, and without rebound.   Msk:  Symmetrical without gross deformities. Normal posture. Extremities:  Without clubbing or edema. Neurologic:  Alert and  oriented x4;  grossly normal neurologically. Skin:  Intact without significant lesions or rashes. Cervical Nodes:  No significant cervical adenopathy. Psych:  Alert and cooperative. Normal mood and affect.  Impression/Plan: [name] is here for a colonoscopy to be performed for colon cancer screening purposes.  The risks of the procedure including infection, bleed, or perforation as well as benefits, limitations, alternatives and imponderables have been reviewed with the patient. Questions have been answered. All parties agreeable.

## 2022-07-27 NOTE — Anesthesia Preprocedure Evaluation (Addendum)
Anesthesia Evaluation  Patient identified by MRN, date of birth, ID band Patient awake    Reviewed: Allergy & Precautions, H&P , NPO status , Patient's Chart, lab work & pertinent test results, reviewed documented beta blocker date and time   Airway Mallampati: II  TM Distance: >3 FB Neck ROM: full    Dental no notable dental hx.    Pulmonary neg pulmonary ROS, Current Smoker,    Pulmonary exam normal breath sounds clear to auscultation       Cardiovascular Exercise Tolerance: Good hypertension, negative cardio ROS   Rhythm:regular Rate:Normal     Neuro/Psych  Neuromuscular disease negative psych ROS   GI/Hepatic Neg liver ROS, GERD  Medicated,  Endo/Other  Hyperthyroidism   Renal/GU negative Renal ROS  negative genitourinary   Musculoskeletal   Abdominal   Peds  Hematology  (+) Blood dyscrasia, anemia ,   Anesthesia Other Findings   Reproductive/Obstetrics negative OB ROS                            Anesthesia Physical Anesthesia Plan  ASA: 2  Anesthesia Plan: General   Post-op Pain Management:    Induction:   PONV Risk Score and Plan: Propofol infusion  Airway Management Planned:   Additional Equipment:   Intra-op Plan:   Post-operative Plan:   Informed Consent: I have reviewed the patients History and Physical, chart, labs and discussed the procedure including the risks, benefits and alternatives for the proposed anesthesia with the patient or authorized representative who has indicated his/her understanding and acceptance.     Dental Advisory Given  Plan Discussed with: CRNA  Anesthesia Plan Comments:         Anesthesia Quick Evaluation

## 2022-07-31 LAB — SURGICAL PATHOLOGY

## 2022-08-03 ENCOUNTER — Encounter (HOSPITAL_COMMUNITY): Payer: Self-pay | Admitting: Internal Medicine

## 2022-08-03 ENCOUNTER — Ambulatory Visit (HOSPITAL_COMMUNITY): Payer: 59 | Admitting: Physical Therapy

## 2022-08-03 DIAGNOSIS — R2689 Other abnormalities of gait and mobility: Secondary | ICD-10-CM | POA: Diagnosis not present

## 2022-08-03 DIAGNOSIS — R29898 Other symptoms and signs involving the musculoskeletal system: Secondary | ICD-10-CM | POA: Diagnosis not present

## 2022-08-03 DIAGNOSIS — M25551 Pain in right hip: Secondary | ICD-10-CM

## 2022-08-03 DIAGNOSIS — M6281 Muscle weakness (generalized): Secondary | ICD-10-CM | POA: Diagnosis not present

## 2022-08-03 NOTE — Therapy (Signed)
OUTPATIENT PHYSICAL THERAPY TREATMENT  Patient Name: David Knox MRN: [id number] DOB:03-24-1966, 56 y.o., male Today's Date: 08/03/2022 PHYSICAL THERAPY DISCHARGE SUMMARY  Visits from Start of Care: 13  Current functional level related to goals / functional outcomes: See below   Remaining deficits: Activity tolerance   Education / Equipment: HEP   Patient agrees to discharge. Patient goals were partially met. All goals met except foto value.  Patient is being discharged due to being pleased with the current functional level.   PT End of Session - 08/03/22 1201     Visit Number 13    Number of Visits 13    Authorization Type [employer] (30 vl PT, OT, ST)    Authorization - Visit Number 12    Authorization - Number of Visits 30    PT Start Time 1115    PT Stop Time 1155    PT Time Calculation (min) 40 min    Activity Tolerance Patient tolerated treatment well    Behavior During Therapy WFL for tasks assessed/performed                     Past Medical History:  Diagnosis Date   Acid reflux    HLD (hyperlipidemia)    HTN (hypertension)    Past Surgical History:  Procedure Laterality Date   ESOPHAGOGASTRODUODENOSCOPY  2002   RMR: ulcerative reflux esophagitis, moderate sized hiatal hernia   HIP SURGERY Bilateral    as a child   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   Procedure: TOTAL HIP ARTHROPLASTY;  Surgeon: Carole Civil, MD;  Location: [facility];  Service: Orthopedics;  Laterality: Right;   WRIST SURGERY     Patient Active Problem List   Diagnosis Date Noted   Frequent bowel movements 06/29/2022   S/P hip replacement, right 05/16/22 05/31/2022   Hypokalemia 05/24/2022   Anemia 05/24/2022   Hospital discharge follow-up 05/24/2022   Subclinical hyperthyroidism 06/22/2021   Vitamin D deficiency 06/22/2021   Prediabetes 06/22/2021   Mixed hyperlipidemia 06/22/2021   Colon cancer screening 06/22/2021   Tobacco abuse 04/27/2021    HTN (hypertension) 04/27/2021   Peripheral polyneuropathy 04/27/2021   Gastroesophageal reflux disease 04/27/2021   Arthritis of right hip 04/27/2021   Alcohol abuse 12/19/2016   Substance abuse ([place]) 12/19/2016    PCP: Ihor Dow MD  REFERRING PROVIDER: Carole Civil, MD   REFERRING DIAG: M16.7 (ICD-10-CM) - Other secondary osteoarthritis of right hip   THERAPY DIAG:  Other abnormalities of gait and mobility  Pain in right hip  Other symptoms and signs involving the musculoskeletal system  Muscle weakness (generalized)  Rationale for Evaluation and Treatment Rehabilitation  ONSET DATE: 05/16/22  SUBJECTIVE:   SUBJECTIVE STATEMENT: PT feels that he is about 85% better.  He is doing well at home unless he sits to long then he stiffens up.  PERTINENT HISTORY: hypertension, right hip arthritis, peripheral neuropathy, s/p R THA on 05/16/22 with a direct lateral approach.    PAIN:  Are you having pain? Yes: NPRS scale: 3/10 Pain location: right hip Pain description: tight, sore Aggravating factors: stiff in the mornings Relieving factors: movement  PRECAUTIONS: Other: direct lateral hip  WEIGHT BEARING RESTRICTIONS No  FALLS:  Has patient fallen in last 6 months? 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  PLOF: Independent  PATIENT  GOALS get back to normal    OBJECTIVE:   PATIENT SURVEYS: 58%  FOTO 56% function  COGNITION:  Overall cognitive status: Within functional limits for tasks assessed     SENSATION: WFL   POSTURE: No Significant postural limitations  PALPATION: Grossly tender lateral hip and proximal thigh  LOWER EXTREMITY ROM:  decreased hip extension with ambulation  LOWER EXTREMITY MMT:  MMT Right eval Left eval Right 07/13/22 Right  08/03/21  Hip flexion 3+ 5 4+ 5  Hip  extension    4/5  Hip abduction    4+/5  Hip adduction      Hip internal rotation      Hip external rotation      Knee flexion '5 5  5  ' Knee extension 4+ '5 5 5  ' Ankle dorsiflexion '5 5 5 5  ' Ankle plantarflexion      Ankle inversion      Ankle eversion       (Blank rows = not tested)    FUNCTIONAL TESTS:  5 times sit to stand:  9/28:  10.4 seconds was 16.04 seconds with UE support, attempts without UE use but unable 2 minute walk test: 375 feet with SPC Transfers: no difficulty was labored, relies LLE, bilateral UE use Stairs: able to ascend with alternating with bilateral UE support, step too pattern on descend  GAIT: Distance walked: 9/28: 456 ft with no device was  375 feet with a cane.   Assistive device utilized: Single point cane Level of assistance: Modified independence Comments: 2 MWT, antalgic, decreased hip extension     TODAY'S TREATMENT: 9/28   Gt x 500 ft 12 steps in a reciprocal manner Single leg stance B x 60 seconds  Prone hip extension Rt x 10 Quadriped:  hip extension x 10 Side step with green theraband x 10  Mm Test see above 07/20/22 Rockerboard lateral 2 min with minimal HHA Squat front of chair with minimal HHA Leg press 5 plates 2 x 10 Walkout 4PL 5RT Used hand to simulate walking with dog retro/forward gait; around waist for sidesteps  Vector stance 5x 5" with 1 HHA  Given handout for [employer]   07/13/22 5 times sit to stand 10.71 sec no UE assist 2 MWT 352 ft with SPC; without SPC 460 ft FOTO 50 %  Nustep 5 minutes seat 12 level 6 Hip vectors 3" hold x 10 Leg press 4 plates 2 x 10           PATIENT EDUCATION:  Education details: 06/07/22:  reviewed goals, demonstrated hip kit, discussed healthcare plan, scar massage instruction; eval: Patient educated on exam findings, POC, scope of PT, HEP, and hip precautions. Person educated: Patient Education method: Explanation, Demonstration, and Handouts Education comprehension:  verbalized understanding, returned demonstration, verbal cues required, and tactile cues required  HOME EXERCISE PROGRAM: Access Code: [email or web address] 9/28/ - Prone Hip Extension  - 2 x daily - 7 x weekly - 1 sets - 15 reps - 3" hold - Quadruped Alternating Leg Extensions  - 2 x daily - 7 x weekly - 1 sets - 15 reps - 3" hold - Side Stepping with Resistance at Thighs  - 2 x daily - 7 x weekly - 1 sets - 5 reps Date: 06/02/2022 - Supine Gluteal Sets  - 3 x daily - 7 x weekly - 10 reps - 10 second hold - Supine Bridge  - 3 x daily - 7 x weekly - 2 sets - 10 reps -  Supine Heel Slide  - 3 x daily - 7 x weekly - 2 sets - 10 reps - Seated Long Arc Quad  - 3 x daily - 7 x weekly - 10 reps - 5-10 second hold  ASSESSMENT:  CLINICAL IMPRESSION: Pt reassessed.  He has met all STG and 3/4 LTG.  PT is I in advanced HEP at this time and does not need skilled care.  PT was urged to increase walking.  OBJECTIVE IMPAIRMENTS Abnormal gait, decreased activity tolerance, decreased balance, decreased endurance, decreased mobility, difficulty walking, decreased ROM, decreased strength, increased muscle spasms, impaired flexibility, improper body mechanics, and pain.   ACTIVITY LIMITATIONS lifting, bending, standing, squatting, sleeping, stairs, transfers, locomotion level, and caring for others  PARTICIPATION LIMITATIONS: cleaning, laundry, driving, shopping, community activity, and yard work  [place] and 1-2 comorbidities: HLD, HTN, neuropathy  are also affecting patient's functional outcome.   REHAB POTENTIAL: Good  CLINICAL DECISION MAKING: Stable/uncomplicated  EVALUATION COMPLEXITY: Low   GOALS: Goals reviewed with patient? No  SHORT TERM GOALS: Target date: 06/23/2022  Patient will be independent with HEP in order to improve functional outcomes. Baseline:  Goal status: MET  2.  Patient will report at least 25% improvement in symptoms for improved quality of life. Baseline: 40-50%  better Goal status: MET   LONG TERM GOALS: Target date: 07/14/2022  Patient will report at least 75% improvement in symptoms for improved quality of life. Baseline: 07/13/22 40-50% better Goal status: MET  2.  Patient will improve FOTO score by at least 20 points in order to indicate improved tolerance to activity. Baseline: 56% function; 07/13/22 50% Goal status: MET  3.  Patient will be able to navigate stairs with reciprocal pattern without compensation or UE use in order to demonstrate improved LE strength. Baseline: see above Goal status: IN PROGRESS  4.  Patient will be able to ambulate at least 500 feet in 2MWT with normal gait mechanics in order to demonstrate improved tolerance to activity. Baseline: 375 feet; 07/13/22 ; 07/13/22 460 ft without AD Goal status: MET  5. Patient will demonstrate grade of 5/5 MMT grade in all tested musculature as evidence of improved strength to assist with stair ambulation and gait.   Baseline: see MMT Goal status: IN PROGRESS   PLAN: PT FREQUENCY: 2x/week  PT DURATION: 6 weeks  PLANNED INTERVENTIONS: Therapeutic exercises, Therapeutic activity, Neuromuscular re-education, Balance training, Gait training, Patient/Family education, Joint manipulation, Joint mobilization, Stair training, Orthotic/Fit training, DME instructions, Aquatic Therapy, Dry Needling, Electrical stimulation, Spinal manipulation, Spinal mobilization, Cryotherapy, Moist heat, Compression bandaging, scar mobilization, Splintting, Taping, Traction, Ultrasound, Ionotophoresis 102m/ml Dexamethasone, and Manual therapy  PLAN FOR NEXT SESSION: discharge  CRayetta Humphrey PT CLT [phone] 11:59 AM, 08/03/22

## 2022-08-07 ENCOUNTER — Encounter (HOSPITAL_COMMUNITY): Payer: 59 | Admitting: Physical Therapy

## 2022-08-09 ENCOUNTER — Encounter (HOSPITAL_COMMUNITY): Payer: 59

## 2022-08-14 ENCOUNTER — Ambulatory Visit (INDEPENDENT_AMBULATORY_CARE_PROVIDER_SITE_OTHER): Payer: 59

## 2022-08-14 ENCOUNTER — Encounter: Payer: Self-pay | Admitting: Orthopedic Surgery

## 2022-08-14 ENCOUNTER — Ambulatory Visit (INDEPENDENT_AMBULATORY_CARE_PROVIDER_SITE_OTHER): Payer: 59 | Admitting: Orthopedic Surgery

## 2022-08-14 ENCOUNTER — Encounter (HOSPITAL_COMMUNITY): Payer: 59 | Admitting: Physical Therapy

## 2022-08-14 DIAGNOSIS — M7071 Other bursitis of hip, right hip: Secondary | ICD-10-CM

## 2022-08-14 DIAGNOSIS — M1611 Unilateral primary osteoarthritis, right hip: Secondary | ICD-10-CM

## 2022-08-14 DIAGNOSIS — Z96641 Presence of right artificial hip joint: Secondary | ICD-10-CM

## 2022-08-14 MED ORDER — PREDNISONE 10 MG (48) PO TBPK: ORAL_TABLET | Freq: Every day | ORAL | 0 refills | Status: DC

## 2022-08-14 NOTE — Progress Notes (Signed)
Chief Complaint  Patient presents with   Post-op Follow-up    05/16/22 right hip replacement started having some pain last week     David Knox finished his physical therapy was [facility] up to 1-1/2 miles per day and then started having lateral hip pain over his incision  He denies fever chills or malaise  He did [facility] [employer] and rested and it is getting a little better  Exam of the right hip  Gait shows some limping on the right side He is tender over the greater trochanter His back is nontender  His range of motion is normal  Probable trochanteric bursitis start prednisone Dosepak return in 2 to 3 weeks for reevaluation  X-ray was taken and it was normal  Note patient has a 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 10 mg 12 dys as directed    Dispense:  48 tablet    Refill:  0

## 2022-08-16 ENCOUNTER — Encounter (HOSPITAL_COMMUNITY): Payer: 59

## 2022-08-17 ENCOUNTER — Encounter: Payer: Self-pay | Admitting: Internal Medicine

## 2022-08-17 ENCOUNTER — Ambulatory Visit (INDEPENDENT_AMBULATORY_CARE_PROVIDER_SITE_OTHER): Payer: 59 | Admitting: Internal Medicine

## 2022-08-17 VITALS — BP 128/82 | HR 87 | Resp 18 | Ht 71.0 in | Wt 194.2 lb

## 2022-08-17 DIAGNOSIS — E559 Vitamin D deficiency, unspecified: Secondary | ICD-10-CM | POA: Diagnosis not present

## 2022-08-17 DIAGNOSIS — E782 Mixed hyperlipidemia: Secondary | ICD-10-CM | POA: Diagnosis not present

## 2022-08-17 DIAGNOSIS — R7303 Prediabetes: Secondary | ICD-10-CM | POA: Diagnosis not present

## 2022-08-17 DIAGNOSIS — Z2821 Immunization not carried out because of patient refusal: Secondary | ICD-10-CM

## 2022-08-17 DIAGNOSIS — Z0001 Encounter for general adult medical examination with abnormal findings: Secondary | ICD-10-CM

## 2022-08-17 DIAGNOSIS — I10 Essential (primary) hypertension: Secondary | ICD-10-CM

## 2022-08-17 DIAGNOSIS — Z23 Encounter for immunization: Secondary | ICD-10-CM | POA: Diagnosis not present

## 2022-08-17 DIAGNOSIS — G629 Polyneuropathy, unspecified: Secondary | ICD-10-CM | POA: Diagnosis not present

## 2022-08-17 DIAGNOSIS — Z125 Encounter for screening for malignant neoplasm of prostate: Secondary | ICD-10-CM

## 2022-08-17 NOTE — Assessment & Plan Note (Signed)
Last vitamin D Lab Results  Component Value Date   VD25OH 18.4 (L) 05/03/2021   Advised to take Vitamin D 2000 IU QD

## 2022-08-17 NOTE — Progress Notes (Signed)
Established Patient Office Visit  Subjective:  Patient ID: David Knox, male    DOB: [birth year]  Age: 56 y.o. MRN: [id number]  CC:  Chief Complaint  Patient presents with   Annual Exam    Annual exam     HPI David Knox is a 56 y.o. male with past medical history of HTN, peripheral neuropathy, GERD, hip arthritis and tobacco abuse who presents for annual physical.  HTN: BP is well-controlled. Takes medications regularly. Patient denies headache, dizziness, chest pain, dyspnea or palpitations.  He has chronic right hip pain, s/p right THA.  He takes naproxen as needed for pain.  He was recently placed on oral prednisone for hip pain.  He still complains of bilateral feet burning pain despite taking  and gabapentin 300 mg twice daily.  He denies any claudication symptoms.    Past Medical History:  Diagnosis Date   Acid reflux    HLD (hyperlipidemia)    HTN (hypertension)     Past Surgical History:  Procedure Laterality Date   COLONOSCOPY WITH PROPOFOL N/A 07/27/2022   Procedure: COLONOSCOPY WITH PROPOFOL;  Surgeon: [name], DO;  Location: [facility];  Service: Endoscopy;  Laterality: N/A;  9:00 am  ASA 2   ESOPHAGOGASTRODUODENOSCOPY  2002   RMR: ulcerative reflux esophagitis, moderate sized hiatal hernia   HIP SURGERY Bilateral    as a child   POLYPECTOMY  07/27/2022   Procedure: POLYPECTOMY;  Surgeon: [name], DO;  Location: [facility];  Service: Endoscopy;;   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   Procedure: TOTAL HIP ARTHROPLASTY;  Surgeon: Carole Civil, MD;  Location: [facility];  Service: Orthopedics;  Laterality: Right;   WRIST SURGERY      Family History  Problem Relation Age of Onset   Diabetes Mother    Heart disease Mother    Diabetes Father    Heart disease Father    Colon cancer Neg Hx     Social History   Socioeconomic History   Marital status: Single    Spouse name: Not on file   Number of children: Not  on file   Years of education: Not on file   Highest education level: Not on file  Occupational History   Not on file  Tobacco Use   Smoking status: Every Day    Packs/day: 0.50    Types: Cigarettes   Smokeless tobacco: Never  Vaping Use   Vaping Use: Never used  Substance and Sexual Activity   Alcohol use: Yes    Comment: beer and liquor twice weekly   Drug use: No    Comment: "crack" former- last about 3 years ago.   Sexual activity: Not on file  Other Topics Concern   Not on file  Social History Narrative   Not on file   Social Determinants of Health   Financial Resource Strain: Not on file  Food Insecurity: Not on file  Transportation Needs: Not on file  Physical Activity: Not on file  Stress: Not on file  Social Connections: Not on file  Intimate Partner Violence: Not on file    Outpatient Medications Prior to Visit  Medication Sig Dispense Refill   acetaminophen (TYLENOL) 500 MG tablet Take 2 tablets (1,000 mg total) by mouth every 6 (six) hours. 30 tablet 0   amLODipine (NORVASC) 5 MG tablet Take 1 tablet (5 mg total) by mouth daily. 90 tablet 1   atorvastatin (LIPITOR) 20 MG tablet Take 1  tablet (20 mg total) by mouth daily. 90 tablet 1   gabapentin (NEURONTIN) 300 MG capsule Take 1 capsule (300 mg total) by mouth 2 (two) times daily. 60 capsule 5   naproxen (NAPROSYN) 500 MG tablet TAKE 1 TABLET BY MOUTH 2 TIMES DAILY WITH A MEAL. 60 tablet 2   pantoprazole (PROTONIX) 20 MG tablet Take 1 tablet (20 mg total) by mouth daily before breakfast. 30 tablet 3   predniSONE (STERAPRED UNI-PAK 48 TAB) 10 MG (48) TBPK tablet Take by mouth daily. 10 mg 12 dys as directed 48 tablet 0   telmisartan (MICARDIS) 40 MG tablet Take 1 tablet (40 mg total) by mouth daily. 90 tablet 1   No facility-administered medications prior to visit.    Allergies  Allergen Reactions   Aspirin Nausea Only    ROS Review of Systems  Constitutional:  Negative for chills and fever.  HENT:   Negative for congestion and sore throat.   Eyes:  Negative for pain and discharge.  Respiratory:  Negative for cough and shortness of breath.   Cardiovascular:  Negative for chest pain and palpitations.  Gastrointestinal:  Negative for diarrhea, nausea and vomiting.  Endocrine: Negative for polydipsia and polyuria.  Genitourinary:  Negative for dysuria and hematuria.  Musculoskeletal:  Positive for arthralgias and back pain. Negative for neck pain and neck stiffness.  Skin:  Negative for rash.  Neurological:  Positive for numbness (In left arm and feet). Negative for dizziness, weakness and headaches.  Psychiatric/Behavioral:  Negative for agitation and behavioral problems.       Objective:    Physical Exam Vitals reviewed.  Constitutional:      General: He is not in acute distress.    Appearance: He is not diaphoretic.  HENT:     Head: Normocephalic and atraumatic.     Nose: Nose normal.     Mouth/Throat:     Mouth: Mucous membranes are moist.  Eyes:     General: No scleral icterus.    Extraocular Movements: Extraocular movements intact.  Neck:     Vascular: No carotid bruit.  Cardiovascular:     Rate and Rhythm: Normal rate and regular rhythm.     Pulses: Normal pulses.     Heart sounds: Normal heart sounds. No murmur heard. Pulmonary:     Breath sounds: Normal breath sounds. No wheezing or rales.  Abdominal:     Palpations: Abdomen is soft.     Tenderness: There is no abdominal tenderness.  Musculoskeletal:     Cervical back: Neck supple. No tenderness.     Right lower leg: No edema.     Left lower leg: No edema.  Skin:    General: Skin is warm.     Findings: No rash.  Neurological:     General: No focal deficit present.     Mental Status: He is alert and oriented to person, place, and time.     Cranial Nerves: No cranial nerve deficit.     Motor: No weakness.  Psychiatric:        Mood and Affect: Mood normal.        Behavior: Behavior normal.     BP  128/82 (BP Location: Left Arm, Patient Position: Sitting, Cuff Size: Normal)   Pulse 87   Resp 18   Ht '5\' 11"'  (1.803 m)   Wt 194 lb 3.2 oz (88.1 kg)   SpO2 97%   BMI 27.09 kg/m  Wt Readings from Last 3 Encounters:  08/17/22 194  lb 3.2 oz (88.1 kg)  07/27/22 184 lb (83.5 kg)  07/21/22 185 lb (83.9 kg)    Lab Results  Component Value Date   TSH 0.821 04/18/2022   Lab Results  Component Value Date   WBC 9.4 05/18/2022   HGB 8.9 (L) 05/18/2022   HCT 26.9 (L) 05/18/2022   MCV 94.7 05/18/2022   PLT 251 05/18/2022   Lab Results  Component Value Date   NA 140 05/24/2022   K 4.7 05/24/2022   CO2 24 05/24/2022   GLUCOSE 252 (H) 05/24/2022   BUN 9 05/24/2022   CREATININE 0.91 05/24/2022   BILITOT 0.3 05/18/2022   ALKPHOS 47 05/18/2022   AST 48 (H) 05/18/2022   ALT 33 05/18/2022   PROT 6.6 05/18/2022   ALBUMIN 3.5 05/18/2022   CALCIUM 9.1 05/24/2022   ANIONGAP 10 05/18/2022   EGFR 100 05/24/2022   Lab Results  Component Value Date   CHOL 150 05/03/2021   Lab Results  Component Value Date   HDL 31 (L) 05/03/2021   Lab Results  Component Value Date   LDLCALC 64 05/03/2021   Lab Results  Component Value Date   TRIG 351 (H) 05/03/2021   Lab Results  Component Value Date   CHOLHDL 4.8 05/03/2021   Lab Results  Component Value Date   HGBA1C 6.0 (H) 04/18/2022      Assessment & Plan:   Problem List Items Addressed This Visit       Cardiovascular and Mediastinum   HTN (hypertension)    BP Readings from Last 1 Encounters:  08/17/22 128/82  Well-controlled with Amlodipine 5 mg QD and Telmisartan 40 mg QD Counseled for compliance with the medications Advised DASH diet and moderate exercise/walking, at least 150 mins/week      Relevant Orders   CMP14+EGFR     Nervous and Auditory   Peripheral polyneuropathy    Better controlled with Gabapentin to 300 mg BID Has prediabetes, could be related to insulin resistance If persistent, will refer to  Neurology        Other   Vitamin D deficiency    Last vitamin D Lab Results  Component Value Date   VD25OH 18.4 (L) 05/03/2021  Advised to take Vitamin D 2000 IU QD      Relevant Orders   VITAMIN D 25 Hydroxy (Vit-D Deficiency, Fractures)   Prediabetes    Lab Results  Component Value Date   HGBA1C 6.0 (H) 04/18/2022  Advised to strictly follow low carb diet, patient agrees.      Relevant Orders   Hemoglobin A1c   CMP14+EGFR   Mixed hyperlipidemia    Check lipid profile On Atorvastatin      Relevant Orders   Lipid panel   Refused influenza vaccine   Encounter for general adult medical examination with abnormal findings - Primary    Physical exam as documented. Counseling done  re healthy lifestyle involving commitment to 150 minutes exercise per week, heart healthy diet, and attaining healthy weight.The importance of adequate sleep also discussed. Changes in health habits are decided on by the patient with goals and time frames  set for achieving them. Immunization and cancer screening needs are specifically addressed at this visit.      Relevant Orders   CMP14+EGFR   CBC with Differential/Platelet   Prostate cancer screening    Ordered PSA after discussing its limitations for prostate cancer screening, including false positive results leading additional investigations.      Relevant Orders  PSA   Other Visit Diagnoses     Need for varicella vaccine       Relevant Orders   Zoster Recombinant (Shingrix ) (Completed)   Need for Tdap vaccination       Relevant Orders   Tdap vaccine greater than or equal to 7yo IM (Completed)       No orders of the defined types were placed in this encounter.   Follow-up: Return in about 5 months (around 01/16/2023) for HTN and second dose of Shingrix.    Lindell Spar, MD

## 2022-08-17 NOTE — Assessment & Plan Note (Signed)
Lab Results  Component Value Date   HGBA1C 6.0 (H) 04/18/2022   Advised to strictly follow low carb diet, patient agrees.

## 2022-08-17 NOTE — Assessment & Plan Note (Signed)

## 2022-08-17 NOTE — Assessment & Plan Note (Signed)
Check lipid profile On Atorvastatin 

## 2022-08-17 NOTE — Assessment & Plan Note (Signed)
BP Readings from Last 1 Encounters:  08/17/22 128/82   Well-controlled with Amlodipine 5 mg QD and Telmisartan 40 mg QD Counseled for compliance with the medications Advised DASH diet and moderate exercise/walking, at least 150 mins/week

## 2022-08-17 NOTE — Assessment & Plan Note (Signed)
Ordered PSA after discussing its limitations for prostate cancer screening, including false positive results leading additional investigations. 

## 2022-08-17 NOTE — Patient Instructions (Signed)
Please continue taking medications as prescribed.  Please continue to follow DASH diet and perform moderate exercise/walking at least 150 mins/week.  Please get fasting blood tests done within a week.

## 2022-08-17 NOTE — Assessment & Plan Note (Signed)
Better controlled with Gabapentin to 300 mg BID Has prediabetes, could be related to insulin resistance If persistent, will refer to Neurology

## 2022-08-19 ENCOUNTER — Emergency Department (HOSPITAL_COMMUNITY)
Admission: EM | Admit: 2022-08-19 | Discharge: 2022-08-19 | Disposition: A | Discharge status: Home / Self Care | Payer: 59 | Attending: Emergency Medicine | Admitting: Emergency Medicine

## 2022-08-19 ENCOUNTER — Encounter (HOSPITAL_COMMUNITY): Payer: Self-pay | Admitting: *Deleted

## 2022-08-19 ENCOUNTER — Other Ambulatory Visit: Payer: Self-pay

## 2022-08-19 DIAGNOSIS — I10 Essential (primary) hypertension: Secondary | ICD-10-CM | POA: Diagnosis not present

## 2022-08-19 DIAGNOSIS — Z79899 Other long term (current) drug therapy: Secondary | ICD-10-CM | POA: Insufficient documentation

## 2022-08-19 DIAGNOSIS — E1165 Type 2 diabetes mellitus with hyperglycemia: Secondary | ICD-10-CM | POA: Diagnosis not present

## 2022-08-19 DIAGNOSIS — R739 Hyperglycemia, unspecified: Secondary | ICD-10-CM | POA: Insufficient documentation

## 2022-08-19 DIAGNOSIS — H538 Other visual disturbances: Secondary | ICD-10-CM | POA: Diagnosis not present

## 2022-08-19 DIAGNOSIS — Z7982 Long term (current) use of aspirin: Secondary | ICD-10-CM | POA: Diagnosis not present

## 2022-08-19 DIAGNOSIS — R350 Frequency of micturition: Secondary | ICD-10-CM | POA: Insufficient documentation

## 2022-08-19 LAB — CBC WITH DIFFERENTIAL/PLATELET
Abs Immature Granulocytes: 0.05 10*3/uL (ref 0.00–0.07)
Basophils Absolute: 0 10*3/uL (ref 0.0–0.1)
Basophils Relative: 0 %
Eosinophils Absolute: 0 10*3/uL (ref 0.0–0.5)
Eosinophils Relative: 0 %
HCT: 38.6 % — ABNORMAL LOW (ref 39.0–52.0)
Hemoglobin: 12.8 g/dL — ABNORMAL LOW (ref 13.0–17.0)
Immature Granulocytes: 1 %
Lymphocytes Relative: 14 %
Lymphs Abs: 1.1 10*3/uL (ref 0.7–4.0)
MCH: 28.1 pg (ref 26.0–34.0)
MCHC: 33.2 g/dL (ref 30.0–36.0)
MCV: 84.6 fL (ref 80.0–100.0)
Monocytes Absolute: 0.5 10*3/uL (ref 0.1–1.0)
Monocytes Relative: 7 %
Neutro Abs: 6.2 10*3/uL (ref 1.7–7.7)
Neutrophils Relative %: 78 %
Platelets: 335 10*3/uL (ref 150–400)
RBC: 4.56 MIL/uL (ref 4.22–5.81)
RDW: 13.3 % (ref 11.5–15.5)
WBC: 7.9 10*3/uL (ref 4.0–10.5)
nRBC: 0 % (ref 0.0–0.2)

## 2022-08-19 LAB — BASIC METABOLIC PANEL
Anion gap: 8 (ref 5–15)
BUN: 27 mg/dL — ABNORMAL HIGH (ref 6–20)
CO2: 24 mmol/L (ref 22–32)
Calcium: 9.2 mg/dL (ref 8.9–10.3)
Chloride: 96 mmol/L — ABNORMAL LOW (ref 98–111)
Creatinine, Ser: 1.19 mg/dL (ref 0.61–1.24)
GFR, Estimated: >60 mL/min (ref >60)
Glucose, Bld: 518 mg/dL (ref 70–99)
Potassium: 4.3 mmol/L (ref 3.5–5.1)
Sodium: 128 mmol/L — ABNORMAL LOW (ref 135–145)

## 2022-08-19 LAB — BLOOD GAS, VENOUS
Acid-Base Excess: 2.4 mmol/L — ABNORMAL HIGH (ref 0.0–2.0)
Bicarbonate: 27.2 mmol/L (ref 20.0–28.0)
Drawn by: 1517
O2 Saturation: 68.7 %
Patient temperature: 36.7
pCO2, Ven: 41 mmHg — ABNORMAL LOW (ref 44–60)
pH, Ven: 7.42 (ref 7.25–7.43)
pO2, Ven: 41 mmHg (ref 32–45)

## 2022-08-19 LAB — CBG MONITORING, ED
Glucose-Capillary: 526 mg/dL (ref 70–99)
Glucose-Capillary: 377 mg/dL — ABNORMAL HIGH (ref 70–99)

## 2022-08-19 LAB — BETA-HYDROXYBUTYRIC ACID: Beta-Hydroxybutyric Acid: 0.48 mmol/L — ABNORMAL HIGH (ref 0.05–0.27)

## 2022-08-19 MED ORDER — INSULIN ASPART 100 UNIT/ML IJ SOLN
10.0000 [IU] | Freq: Once | INTRAMUSCULAR | Status: AC
  Administered 2022-08-19: 10 [IU] via SUBCUTANEOUS
  Filled 2022-08-19: qty 1

## 2022-08-19 MED ORDER — LACTATED RINGERS IV BOLUS
20.0000 mL/kg | Freq: Once | INTRAVENOUS | Status: AC
  Administered 2022-08-19: 1762 mL via INTRAVENOUS

## 2022-08-19 NOTE — ED Notes (Signed)
Went over dc papers. All questions answered. Ambulatory to lobby with family.  

## 2022-08-19 NOTE — ED Provider Notes (Signed)
[facility] EMERGENCY DEPARTMENT Provider Note   CSN: [id number] Arrival date & time: 08/19/22  1844     History  Chief Complaint  Patient presents with   Hyperglycemia    David Knox is a 56 y.o. male.  Patient presents to the hospital with hyperglycemia.  Patient states that for the past week he has been experiencing blurry vision and was seen by his primary care provider on Wednesday.  He states that the checkup was unremarkable.  Routine labs were ordered at that visit including but the patient was unable to get labs due to transportation issues with plans to get those labs on Monday.  Patient states that he is prediabetic.  The patient does endorse increased thirst, urination, and hunger for the past week or so.  He was started on prednisone on Tuesday due to right-sided hip pain secondary to hip surgeries.  Past medical history significant for hypertension, acid reflux, hyperlipidemia, bilateral hip surgeries, history of substance abuse  HPI     Home Medications Prior to Admission medications   Medication Sig Start Date End Date Taking? Authorizing Provider  acetaminophen (TYLENOL) 500 MG tablet Take 2 tablets (1,000 mg total) by mouth every 6 (six) hours. 05/17/22   Carole Civil, MD  amLODipine (NORVASC) 5 MG tablet Take 1 tablet (5 mg total) by mouth daily. 06/16/22   Lindell Spar, MD  atorvastatin (LIPITOR) 20 MG tablet Take 1 tablet (20 mg total) by mouth daily. 06/16/22   Lindell Spar, MD  gabapentin (NEURONTIN) 300 MG capsule Take 1 capsule (300 mg total) by mouth 2 (two) times daily. 05/01/22   Lindell Spar, MD  naproxen (NAPROSYN) 500 MG tablet TAKE 1 TABLET BY MOUTH 2 TIMES DAILY WITH A MEAL. 06/12/22   Carole Civil, MD  pantoprazole (PROTONIX) 20 MG tablet Take 1 tablet (20 mg total) by mouth daily before breakfast. 06/29/22   Erenest Rasher, PA-C  predniSONE (STERAPRED UNI-PAK 48 TAB) 10 MG (48) TBPK tablet Take by mouth daily. 10 mg 12 dys as  directed 08/14/22   Carole Civil, MD  telmisartan (MICARDIS) 40 MG tablet Take 1 tablet (40 mg total) by mouth daily. 06/16/22   Lindell Spar, MD      Allergies    Aspirin    Review of Systems   Review of Systems  Eyes:  Positive for visual disturbance.  Respiratory:  Negative for shortness of breath.   Cardiovascular:  Negative for chest pain.  Gastrointestinal:  Negative for abdominal pain, nausea and vomiting.  Endocrine: Positive for polydipsia, polyphagia and polyuria.  Genitourinary:  Negative for dysuria.    Physical Exam Updated Vital Signs BP (!) 120/93   Pulse (!) 58   Temp 98 F (36.7 C) (Oral)   Resp 19   SpO2 99%  Physical Exam Vitals and nursing note reviewed.  Constitutional:      General: He is not in acute distress.    Appearance: He is well-developed.  HENT:     Head: Normocephalic and atraumatic.  Eyes:     Conjunctiva/sclera: Conjunctivae normal.     Pupils: Pupils are equal, round, and reactive to light.     Funduscopic exam:    Right eye: No hemorrhage, exudate, AV nicking or papilledema.        Left eye: No hemorrhage, exudate, AV nicking or papilledema.  Cardiovascular:     Rate and Rhythm: Normal rate and regular rhythm.     Heart sounds:  No murmur heard. Pulmonary:     Effort: Pulmonary effort is normal. No respiratory distress.     Breath sounds: Normal breath sounds.  Abdominal:     Palpations: Abdomen is soft.     Tenderness: There is no abdominal tenderness.  Musculoskeletal:        General: No swelling.     Cervical back: Neck supple.  Skin:    General: Skin is warm and dry.     Capillary Refill: Capillary refill takes less than 2 seconds.  Neurological:     Mental Status: He is alert.  Psychiatric:        Mood and Affect: Mood normal.     ED Results / Procedures / Treatments   Labs (all labs ordered are listed, but only abnormal results are displayed) Labs Reviewed  BASIC METABOLIC PANEL - Abnormal; Notable for  the following components:      Result Value   Sodium 128 (*)    Chloride 96 (*)    Glucose, Bld 518 (*)    BUN 27 (*)    All other components within normal limits  CBC WITH DIFFERENTIAL/PLATELET - Abnormal; Notable for the following components:   Hemoglobin 12.8 (*)    HCT 38.6 (*)    All other components within normal limits  BETA-HYDROXYBUTYRIC ACID - Abnormal; Notable for the following components:   Beta-Hydroxybutyric Acid 0.48 (*)    All other components within normal limits  BLOOD GAS, VENOUS - Abnormal; Notable for the following components:   pCO2, Ven 41 (*)    Acid-Base Excess 2.4 (*)    All other components within normal limits  CBG MONITORING, ED - Abnormal; Notable for the following components:   Glucose-Capillary 526 (*)    All other components within normal limits  CBG MONITORING, ED - Abnormal; Notable for the following components:   Glucose-Capillary 377 (*)    All other components within normal limits  URINALYSIS, ROUTINE W REFLEX MICROSCOPIC  OSMOLALITY  HEMOGLOBIN A1C  CBG MONITORING, ED    EKG None  Radiology No results found.  Procedures Procedures    Medications Ordered in ED Medications  lactated ringers bolus 1,762 mL (1,762 mLs Intravenous New Bag/Given 08/19/22 1928)  insulin aspart (novoLOG) injection 10 Units (10 Units Subcutaneous Given 08/19/22 2006)    ED Course/ Medical Decision Making/ A&P                           Medical Decision Making Amount and/or Complexity of Data Reviewed Labs: ordered.  Risk Prescription drug management.   This patient presents to the ED for concern of Hyperglycemia, this involves an extensive number of treatment options, and is a complaint that carries with it a high risk of complications and morbidity.  The differential diagnosis includes hyperglycemia, DKA, HHS, infection, reaction with steroids, and others   Co morbidities that complicate the patient evaluation  History of steroid  use   Additional history obtained:  Additional history obtained from N/A External records from outside source obtained and reviewed including notes from primary care and orthopedic surgery showing prednisone prescription and grossly unremarkable physical   Lab Tests:  I Ordered, and personally interpreted labs.  The pertinent results include: Initial glucose 518, sodium 128, grossly unremarkable venous blood gas, beta hydroxybutyric acid 0.48, hemoglobin 12.8, repeat CBG 377     Problem List / ED Course / Critical interventions / Medication management  Hyperglycemia I ordered medication including insulin and  lactated Ringer's for hyperglycemia Reevaluation of the patient after these medicines showed that the patient improved I have reviewed the patients home medicines and have made adjustments as needed   Social Determinants of Health:  Patient has issues with transportation   Test / Admission - Considered:  Lab work does not indicate DKA or HHS.  No sign of infection, no white count.  No other symptoms.  The patient appears to be hyperglycemic likely due to the recent steroid prescription.  Hemoglobin A1c ordered and pending at this time.  Patient's last hemoglobin A1c was in June of this year.  Question whether or not patient may be diabetic at this time.  Patient with plans for routine labs on Monday ordered by his primary care provider.  Recommend the patient follow-up with primary care provider to discuss today's hospital visit and follow-up evaluation.  The patient does not wish to stay at the hospital and I see no indication requiring admission at this time.  The patient may discharge home.  I do recommend that the patient follow-up with an eye care professional to discuss his vision changes over the past few weeks.        Final Clinical Impression(s) / ED Diagnoses Final diagnoses:  Hyperglycemia    Rx / DC Orders ED Discharge Orders     None          Ronny Bacon 08/19/22 2231    Hayden Rasmussen, MD 08/20/22 1046

## 2022-08-19 NOTE — Discharge Instructions (Addendum)
You were seen today for high blood sugar.  I feel that this is likely due to to the recent steroid prescription you have been taking.  Please keep your lab appointment on Monday, and call and follow-up with your primary care provider letting him know about your visit today.  I also recommend follow up with an eye care professional to evaluate your vision. Eat a diet low in sugars and carbohydrates until your sugar levels finish normalizing.  Please be sure to drink plenty of water.  If you develop an altered level of consciousness or any other life-threatening conditions please return to the emergency department.

## 2022-08-19 NOTE — ED Triage Notes (Signed)
Pt with blurry vision since last week. Seen PCP on Wednesday and was told everything was okay.  Cbg read high at home. Pt denies taking anything for diabetes. Pt denies any pain, + increase thirst. Pt was started on Prednisone on Tuesday

## 2022-08-20 LAB — OSMOLALITY: Osmolality: 304 mOsm/kg — ABNORMAL HIGH (ref 275–295)

## 2022-08-20 LAB — HEMOGLOBIN A1C
Hgb A1c MFr Bld: 10.1 % — ABNORMAL HIGH (ref 4.8–5.6)
Mean Plasma Glucose: 243.17 mg/dL

## 2022-08-21 ENCOUNTER — Ambulatory Visit (INDEPENDENT_AMBULATORY_CARE_PROVIDER_SITE_OTHER): Payer: 59 | Admitting: Internal Medicine

## 2022-08-21 ENCOUNTER — Other Ambulatory Visit (HOSPITAL_COMMUNITY)
Admission: RE | Admit: 2022-08-21 | Discharge: 2022-08-21 | Disposition: A | Discharge status: Home / Self Care | Payer: 59 | Source: Ambulatory Visit | Attending: Internal Medicine | Admitting: Internal Medicine

## 2022-08-21 ENCOUNTER — Encounter: Payer: Self-pay | Admitting: Internal Medicine

## 2022-08-21 ENCOUNTER — Telehealth: Payer: Self-pay | Admitting: Internal Medicine

## 2022-08-21 VITALS — BP 130/82 | HR 76 | Ht 71.0 in | Wt 190.0 lb

## 2022-08-21 DIAGNOSIS — E1140 Type 2 diabetes mellitus with diabetic neuropathy, unspecified: Secondary | ICD-10-CM | POA: Diagnosis not present

## 2022-08-21 DIAGNOSIS — R7303 Prediabetes: Secondary | ICD-10-CM | POA: Diagnosis not present

## 2022-08-21 DIAGNOSIS — E782 Mixed hyperlipidemia: Secondary | ICD-10-CM | POA: Diagnosis not present

## 2022-08-21 DIAGNOSIS — Z0001 Encounter for general adult medical examination with abnormal findings: Secondary | ICD-10-CM | POA: Diagnosis not present

## 2022-08-21 DIAGNOSIS — I10 Essential (primary) hypertension: Secondary | ICD-10-CM | POA: Diagnosis not present

## 2022-08-21 DIAGNOSIS — Z125 Encounter for screening for malignant neoplasm of prostate: Secondary | ICD-10-CM | POA: Diagnosis not present

## 2022-08-21 DIAGNOSIS — E559 Vitamin D deficiency, unspecified: Secondary | ICD-10-CM | POA: Diagnosis not present

## 2022-08-21 DIAGNOSIS — E1165 Type 2 diabetes mellitus with hyperglycemia: Secondary | ICD-10-CM

## 2022-08-21 LAB — BASIC METABOLIC PANEL
Anion gap: 11 (ref 5–15)
BUN: 22 mg/dL — ABNORMAL HIGH (ref 6–20)
CO2: 25 mmol/L (ref 22–32)
Calcium: 9.6 mg/dL (ref 8.9–10.3)
Chloride: 97 mmol/L — ABNORMAL LOW (ref 98–111)
Creatinine, Ser: 0.97 mg/dL (ref 0.61–1.24)
GFR, Estimated: >60 mL/min (ref >60)
Glucose, Bld: 312 mg/dL — ABNORMAL HIGH (ref 70–99)
Potassium: 4.3 mmol/L (ref 3.5–5.1)
Sodium: 133 mmol/L — ABNORMAL LOW (ref 135–145)

## 2022-08-21 LAB — POCT URINALYSIS DIP (CLINITEK)
Bilirubin, UA: NEGATIVE
Blood, UA: NEGATIVE
Glucose, UA: 500 mg/dL — AB
Leukocytes, UA: NEGATIVE
Nitrite, UA: NEGATIVE
POC PROTEIN,UA: NEGATIVE
Spec Grav, UA: 1.015 (ref 1.010–1.025)
Urobilinogen, UA: 0.2 E.U./dL
pH, UA: 5.5 (ref 5.0–8.0)

## 2022-08-21 LAB — GLUCOSE, POCT (MANUAL RESULT ENTRY): POC Glucose: 372 mg/dl — AB (ref 70–99)

## 2022-08-21 MED ORDER — INSULIN ASPART 100 UNIT/ML IJ SOLN
4.0000 [IU] | Freq: Once | INTRAMUSCULAR | Status: AC
  Administered 2022-08-21: 4 [IU] via SUBCUTANEOUS

## 2022-08-21 MED ORDER — METFORMIN HCL 500 MG PO TABS: 500.0000 mg | ORAL_TABLET | Freq: Every day | ORAL | 3 refills | Status: DC

## 2022-08-21 MED ORDER — BLOOD GLUCOSE METER KIT: PACK | 0 refills | Status: AC

## 2022-08-21 NOTE — Patient Instructions (Addendum)
Thank you for trusting me with your care. To recap, today we discussed the following:   1. Type 2 diabetes mellitus with diabetic neuropathy, without long-term current use of insulin (HCC) - Basic Metabolic Panel (BMET) - blood glucose meter kit and supplies; Dispense based on patient and insurance preference. Use up to four times daily as directed. (FOR ICD-10 E10.9, E11.9).  Dispense: 1 each; Refill: 0 - Start Metformin Week 1 - 500 mg with breakfast Week 2 - 500 mg with breakfast and dinner Week 3 - 1000 mg with breakfast and 500 mg with dinner Week 4 and continue - 1000 mg with breakfast and 1000 mg with dinner Follow up in 1 week

## 2022-08-21 NOTE — Telephone Encounter (Signed)
Pt called stating the label direction son the medication he was given is different then what he was told in office? Can you please contact patient?

## 2022-08-21 NOTE — Progress Notes (Signed)
     CC: hyperglycemia  HPI:David Knox is a 56 y.o. male who presents for evaluation of hyperglycemia. For the details of today's visit, please refer to the assessment and plan.   Past Medical History:  Diagnosis Date   Acid reflux    HLD (hyperlipidemia)    HTN (hypertension)     Review of Systems:    Review of Systems  Constitutional:  Negative for chills and fever.  Eyes:  Positive for blurred vision. Negative for double vision and pain.  Gastrointestinal:  Negative for abdominal pain, nausea and vomiting.     Physical Exam: Vitals:   08/21/22 1003  BP: 130/82  Pulse: 76  SpO2: 95%  Weight: 190 lb (86.2 kg)  Height: 5\' 11"  (1.803 m)     Physical Exam Constitutional:      General: He is not in acute distress.    Appearance: He is not ill-appearing.  Cardiovascular:     Rate and Rhythm: Normal rate and regular rhythm.     Pulses: Normal pulses.     Heart sounds: Normal heart sounds.  Abdominal:     General: Abdomen is flat. Bowel sounds are normal.  Musculoskeletal:     Right lower leg: No edema.     Left lower leg: No edema.  Skin:    General: Skin is warm and dry.      Assessment & Plan:   Type 2 diabetes mellitus with hyperglycemia [facility]) Patient is here for ER follow-up for hyperglycemia. Patient was prediabetic.  He was recently started on steroid dose pack for greater trochanteric bursitis. Hemoglobin A1c is 10.1.Hgb A1c was 6 4 months ago. He has had some blurred vision. Denies nausea, vomiting, confusion , and fatigue.   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 Patient had some ketones in urine, but not in DKA. Given Novolog in clinic and starting patient on Metformin. He has stopped steroids. Asked him to reach out to his orthopedic surgeon. I will also send a message to orthopedist to coordinate care. An injection may be an alternative given new diagnoses. Referred to diabetic educator and blood  glucose testing supplies ordered. He has follow up in our office in a week with Dr.Patel.      Lorene Dy, MD

## 2022-08-22 ENCOUNTER — Other Ambulatory Visit: Payer: Self-pay

## 2022-08-22 ENCOUNTER — Encounter (HOSPITAL_COMMUNITY): Payer: Self-pay

## 2022-08-22 ENCOUNTER — Emergency Department (HOSPITAL_COMMUNITY)
Admission: EM | Admit: 2022-08-22 | Discharge: 2022-08-22 | Discharge status: Against Medical Advice | Payer: 59 | Attending: Emergency Medicine | Admitting: Emergency Medicine

## 2022-08-22 ENCOUNTER — Telehealth: Payer: Self-pay

## 2022-08-22 DIAGNOSIS — W19XXXA Unspecified fall, initial encounter: Secondary | ICD-10-CM | POA: Diagnosis not present

## 2022-08-22 DIAGNOSIS — M25551 Pain in right hip: Secondary | ICD-10-CM | POA: Diagnosis not present

## 2022-08-22 DIAGNOSIS — Z5321 Procedure and treatment not carried out due to patient leaving prior to being seen by health care provider: Secondary | ICD-10-CM | POA: Insufficient documentation

## 2022-08-22 DIAGNOSIS — E1140 Type 2 diabetes mellitus with diabetic neuropathy, unspecified: Secondary | ICD-10-CM | POA: Insufficient documentation

## 2022-08-22 DIAGNOSIS — E1165 Type 2 diabetes mellitus with hyperglycemia: Secondary | ICD-10-CM | POA: Insufficient documentation

## 2022-08-22 LAB — LIPID PANEL
Chol/HDL Ratio: 4.6 ratio (ref 0.0–5.0)
Cholesterol, Total: 160 mg/dL (ref 100–199)
HDL: 35 mg/dL — ABNORMAL LOW (ref >39)
LDL Chol Calc (NIH): 30 mg/dL (ref 0–99)
Triglycerides: 717 mg/dL (ref 0–149)
VLDL Cholesterol Cal: 95 mg/dL — ABNORMAL HIGH (ref 5–40)

## 2022-08-22 LAB — CBC WITH DIFFERENTIAL/PLATELET
Basophils Absolute: 0 10*3/uL (ref 0.0–0.2)
Basos: 0 %
EOS (ABSOLUTE): 0 10*3/uL (ref 0.0–0.4)
Eos: 0 %
Hematocrit: 41.8 % (ref 37.5–51.0)
Hemoglobin: 13.5 g/dL (ref 13.0–17.7)
Immature Grans (Abs): 0.1 10*3/uL (ref 0.0–0.1)
Immature Granulocytes: 1 %
Lymphocytes Absolute: 1.2 10*3/uL (ref 0.7–3.1)
Lymphs: 12 %
MCH: 28.1 pg (ref 26.6–33.0)
MCHC: 32.3 g/dL (ref 31.5–35.7)
MCV: 87 fL (ref 79–97)
Monocytes Absolute: 0.4 10*3/uL (ref 0.1–0.9)
Monocytes: 4 %
Neutrophils Absolute: 7.9 10*3/uL — ABNORMAL HIGH (ref 1.4–7.0)
Neutrophils: 83 %
Platelets: 372 10*3/uL (ref 150–450)
RBC: 4.8 x10E6/uL (ref 4.14–5.80)
RDW: 13.8 % (ref 11.6–15.4)
WBC: 9.6 10*3/uL (ref 3.4–10.8)

## 2022-08-22 LAB — CMP14+EGFR
ALT: 23 IU/L (ref 0–44)
AST: 12 IU/L (ref 0–40)
Albumin/Globulin Ratio: 1.6 (ref 1.2–2.2)
Albumin: 4.6 g/dL (ref 3.8–4.9)
Alkaline Phosphatase: 138 IU/L — ABNORMAL HIGH (ref 44–121)
BUN/Creatinine Ratio: 21 — ABNORMAL HIGH (ref 9–20)
BUN: 23 mg/dL (ref 6–24)
Bilirubin Total: 0.3 mg/dL (ref 0.0–1.2)
CO2: 21 mmol/L (ref 20–29)
Calcium: 10 mg/dL (ref 8.7–10.2)
Chloride: 92 mmol/L — ABNORMAL LOW (ref 96–106)
Creatinine, Ser: 1.07 mg/dL (ref 0.76–1.27)
Globulin, Total: 2.9 g/dL (ref 1.5–4.5)
Glucose: 404 mg/dL — ABNORMAL HIGH (ref 70–99)
Potassium: 4.7 mmol/L (ref 3.5–5.2)
Sodium: 131 mmol/L — ABNORMAL LOW (ref 134–144)
Total Protein: 7.5 g/dL (ref 6.0–8.5)
eGFR: 82 mL/min/{1.73_m2} (ref >59)

## 2022-08-22 LAB — HEMOGLOBIN A1C
Est. average glucose Bld gHb Est-mCnc: 260 mg/dL
Hgb A1c MFr Bld: 10.7 % — ABNORMAL HIGH (ref 4.8–5.6)

## 2022-08-22 LAB — PSA: Prostate Specific Ag, Serum: 0.4 ng/mL (ref 0.0–4.0)

## 2022-08-22 LAB — CBG MONITORING, ED: Glucose-Capillary: 320 mg/dL — ABNORMAL HIGH (ref 70–99)

## 2022-08-22 LAB — VITAMIN D 25 HYDROXY (VIT D DEFICIENCY, FRACTURES): Vit D, 25-Hydroxy: 10.4 ng/mL — ABNORMAL LOW (ref 30.0–100.0)

## 2022-08-22 NOTE — Telephone Encounter (Signed)
Patient left message during lunch stating that he had to stop the prednisone pak because it was spiking his diabetes. He didn't know he was diabetic at the time you prescribed it He wants to see if you can prescribe something else for the pain.

## 2022-08-22 NOTE — Telephone Encounter (Signed)
Patient called back at 3:20pm today to relay that he was going back to [facility] emergency room due to the increased pain. Wanted to let us know.

## 2022-08-22 NOTE — ED Triage Notes (Signed)
Pt reports "my hip stopped." Pt states he fell. Pt states his BG is up due to prednisone. Pt reports R hip pain. July 11 total hip replacement

## 2022-08-22 NOTE — Assessment & Plan Note (Addendum)
Patient is here for ER follow-up for hyperglycemia. Patient was prediabetic.  He was recently started on steroid dose pack for greater trochanteric bursitis. Hemoglobin A1c is 10.1.Hgb A1c was 6 4 months ago. He has had some blurred vision. Denies nausea, vomiting, confusion , and fatigue.   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 Patient had some ketones in urine, but not in DKA on my review of STAT BMP. Given Novolog in clinic and starting patient on Metformin. He was given instruction to increase dose by 500 mg weekly until he reaches 1000 mg BID. He has stopped steroids. Asked him to reach out to his orthopedic surgeon. I will also send a message to orthopedist to coordinate care. An injection may be an alternative given new diagnoses. Referred to diabetic educator and blood glucose testing supplies ordered. He has follow up in our office in a week with Dr.Patel.

## 2022-08-23 NOTE — Telephone Encounter (Signed)
Pt understands directions

## 2022-08-24 ENCOUNTER — Telehealth: Payer: Self-pay | Admitting: Internal Medicine

## 2022-08-24 ENCOUNTER — Other Ambulatory Visit: Payer: Self-pay

## 2022-08-24 MED ORDER — UNABLE TO FIND: 2 refills | Status: DC

## 2022-08-24 NOTE — Telephone Encounter (Signed)
Spoke with pharmacy they just printed faxes pt aware they have rx

## 2022-08-24 NOTE — Telephone Encounter (Signed)
Rx sent for test strips and lancets

## 2022-08-24 NOTE — Telephone Encounter (Signed)
Pt called stating that he was seen by Dr. Court Joy on Monday & a glucose monitor was ordered. States he has been unable to check his blood sugar level since he was seen in office. States it has been running high. Wants to know if you can please send a RX for testing strips for the ONE TOUCH BERIO FLEX KIT?    [facility]

## 2022-08-24 NOTE — Telephone Encounter (Signed)
Patient called in regard to Lancets and test strips    Pharm ([facility] [address] ) has not received refill request.   Patient wants a call back in regard

## 2022-08-29 ENCOUNTER — Ambulatory Visit (INDEPENDENT_AMBULATORY_CARE_PROVIDER_SITE_OTHER): Payer: 59 | Admitting: Internal Medicine

## 2022-08-29 ENCOUNTER — Encounter: Payer: Self-pay | Admitting: Internal Medicine

## 2022-08-29 VITALS — BP 124/86 | HR 84 | Resp 18 | Ht 71.0 in | Wt 190.2 lb

## 2022-08-29 DIAGNOSIS — E1140 Type 2 diabetes mellitus with diabetic neuropathy, unspecified: Secondary | ICD-10-CM

## 2022-08-29 DIAGNOSIS — E1165 Type 2 diabetes mellitus with hyperglycemia: Secondary | ICD-10-CM

## 2022-08-29 DIAGNOSIS — H538 Other visual disturbances: Secondary | ICD-10-CM

## 2022-08-29 MED ORDER — UNABLE TO FIND: 2 refills | Status: DC

## 2022-08-29 MED ORDER — METFORMIN HCL 1000 MG PO TABS: 1000.0000 mg | ORAL_TABLET | Freq: Two times a day (BID) | ORAL | 1 refills | Status: DC

## 2022-08-29 NOTE — Patient Instructions (Signed)
Please start taking Metformin 1000 mg twice daily.  Please follow low carb diet and perform moderate exercise/walking at least 150 mins/week.  Please get fasting blood tests done before the next visit.

## 2022-08-29 NOTE — Progress Notes (Signed)
Established Patient Office Visit  Subjective:  Patient ID: David Knox, male    DOB: [birth year]  Age: 56 y.o. MRN: [id number]  CC:  Chief Complaint  Patient presents with   Follow-up    1 week follow up blood sugars have been running high was seen in ER and then followed up with David Knox    HPI David Knox is a 56 y.o. male with past medical history of HTN, type 2 DM, peripheral neuropathy, GERD, hip arthritis and tobacco abuse who presents for f/u of his chronic medical conditions.  Type II DM: He was recently diagnosed with type II DM, likely due to oral steroid use.  He used to be in prediabetes range till 06/23, HbA1c 6.0, but it increased to 10.7 recently.  He went to ER with blurry vision, dizziness and excessive thirst, where he was diagnosed with type II DM.  He was placed on metformin 500 mg daily.  He had a visit with Dr. Court Knox, and has been placed on metformin 1000 mg twice daily now.  He was given glucometer and supplies, but could not get supplies.  He has not been checking his blood glucose due to it.  He still complains of excessive thirst and blurry vision.  He is going to find optometrist.     Past Medical History:  Diagnosis Date   Acid reflux    HLD (hyperlipidemia)    HTN (hypertension)     Past Surgical History:  Procedure Laterality Date   COLONOSCOPY WITH PROPOFOL N/A 07/27/2022   Procedure: COLONOSCOPY WITH PROPOFOL;  Surgeon: [name], DO;  Location: [facility];  Service: Endoscopy;  Laterality: N/A;  9:00 am  ASA 2   ESOPHAGOGASTRODUODENOSCOPY  2002   RMR: ulcerative reflux esophagitis, moderate sized hiatal hernia   HIP SURGERY Bilateral    as a child   POLYPECTOMY  07/27/2022   Procedure: POLYPECTOMY;  Surgeon: [name], DO;  Location: [facility];  Service: Endoscopy;;   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   Procedure: TOTAL HIP ARTHROPLASTY;  Surgeon: Carole Civil, MD;  Location: [facility] OR[facility];  Service:  Orthopedics;  Laterality: Right;   WRIST SURGERY      Family History  Problem Relation Age of Onset   Diabetes Mother    Heart disease Mother    Diabetes Father    Heart disease Father    Colon cancer Neg Hx     Social History   Socioeconomic History   Marital status: Single    Spouse name: Not on file   Number of children: Not on file   Years of education: Not on file   Highest education level: Not on file  Occupational History   Not on file  Tobacco Use   Smoking status: Every Day    Packs/day: 0.50    Types: Cigarettes   Smokeless tobacco: Never  Vaping Use   Vaping Use: Never used  Substance and Sexual Activity   Alcohol use: Yes    Comment: beer and liquor twice weekly   Drug use: No    Comment: "crack" former- last about 3 years ago.   Sexual activity: Not on file  Other Topics Concern   Not on file  Social History Narrative   Not on file   Social Determinants of Health   Financial Resource Strain: Not on file  Food Insecurity: Not on file  Transportation Needs: Not on file  Physical Activity: Not on file  Stress:  Not on file  Social Connections: Not on file  Intimate Partner Violence: Not on file    Outpatient Medications Prior to Visit  Medication Sig Dispense Refill   acetaminophen (TYLENOL) 500 MG tablet Take 2 tablets (1,000 mg total) by mouth every 6 (six) hours. 30 tablet 0   amLODipine (NORVASC) 5 MG tablet Take 1 tablet (5 mg total) by mouth daily. 90 tablet 1   atorvastatin (LIPITOR) 20 MG tablet Take 1 tablet (20 mg total) by mouth daily. 90 tablet 1   blood glucose meter kit and supplies Dispense based on patient and insurance preference. Use up to four times daily as directed. (FOR ICD-10 E10.9, E11.9). 1 each 0   gabapentin (NEURONTIN) 300 MG capsule Take 1 capsule (300 mg total) by mouth 2 (two) times daily. 60 capsule 5   naproxen (NAPROSYN) 500 MG tablet TAKE 1 TABLET BY MOUTH 2 TIMES DAILY WITH A MEAL. 60 tablet 2   pantoprazole  (PROTONIX) 20 MG tablet Take 1 tablet (20 mg total) by mouth daily before breakfast. 30 tablet 3   telmisartan (MICARDIS) 40 MG tablet Take 1 tablet (40 mg total) by mouth daily. 90 tablet 1   metFORMIN (GLUCOPHAGE) 500 MG tablet Take 1 tablet (500 mg total) by mouth daily with breakfast. 180 tablet 3   UNABLE TO FIND Test Strips for One Touch Verio Flex. Use up to four times daily as directed. (FOR ICD-10 E10.9, E11.9).   100 each 2   UNABLE TO FIND Lancets Use up to four times daily as directed. (FOR ICD-10 E10.9, E11.9).   100 each 2   No facility-administered medications prior to visit.    Allergies  Allergen Reactions   Aspirin Nausea Only    ROS Review of Systems  Constitutional:  Negative for chills and fever.  HENT:  Negative for congestion and sore throat.   Eyes:  Negative for pain and discharge.  Respiratory:  Negative for cough and shortness of breath.   Cardiovascular:  Negative for chest pain and palpitations.  Gastrointestinal:  Negative for diarrhea, nausea and vomiting.  Endocrine: Negative for polydipsia and polyuria.  Genitourinary:  Negative for dysuria and hematuria.  Musculoskeletal:  Positive for arthralgias and back pain. Negative for neck pain and neck stiffness.  Skin:  Negative for rash.  Neurological:  Positive for numbness (In left arm and feet). Negative for dizziness, weakness and headaches.  Psychiatric/Behavioral:  Negative for agitation and behavioral problems.       Objective:    Physical Exam Vitals reviewed.  Constitutional:      General: He is not in acute distress.    Appearance: He is not diaphoretic.  HENT:     Head: Normocephalic and atraumatic.     Nose: Nose normal.     Mouth/Throat:     Mouth: Mucous membranes are moist.  Eyes:     General: No scleral icterus.    Extraocular Movements: Extraocular movements intact.  Neck:     Vascular: No carotid bruit.  Cardiovascular:     Rate and Rhythm: Normal rate and regular rhythm.      Pulses: Normal pulses.     Heart sounds: Normal heart sounds. No murmur heard. Pulmonary:     Breath sounds: Normal breath sounds. No wheezing or rales.  Musculoskeletal:     Cervical back: Neck supple. No tenderness.     Right lower leg: No edema.     Left lower leg: No edema.  Skin:    General:  Skin is warm.     Findings: No rash.  Neurological:     General: No focal deficit present.     Mental Status: He is alert and oriented to person, place, and time.     Cranial Nerves: No cranial nerve deficit.     Motor: No weakness.  Psychiatric:        Mood and Affect: Mood normal.        Behavior: Behavior normal.     BP 124/86 (BP Location: Right Arm, Patient Position: Sitting, Cuff Size: Normal)   Pulse 84   Resp 18   Ht '5\' 11"'  (1.803 m)   Wt 190 lb 3.2 oz (86.3 kg)   SpO2 97%   BMI 26.53 kg/m  Wt Readings from Last 3 Encounters:  08/29/22 190 lb 3.2 oz (86.3 kg)  08/21/22 190 lb (86.2 kg)  08/17/22 194 lb 3.2 oz (88.1 kg)    Lab Results  Component Value Date   TSH 0.821 04/18/2022   Lab Results  Component Value Date   WBC 9.6 08/21/2022   HGB 13.5 08/21/2022   HCT 41.8 08/21/2022   MCV 87 08/21/2022   PLT 372 08/21/2022   Lab Results  Component Value Date   NA 133 (L) 08/21/2022   K 4.3 08/21/2022   CO2 25 08/21/2022   GLUCOSE 312 (H) 08/21/2022   BUN 22 (H) 08/21/2022   CREATININE 0.97 08/21/2022   BILITOT 0.3 08/21/2022   ALKPHOS 138 (H) 08/21/2022   AST 12 08/21/2022   ALT 23 08/21/2022   PROT 7.5 08/21/2022   ALBUMIN 4.6 08/21/2022   CALCIUM 9.6 08/21/2022   ANIONGAP 11 08/21/2022   EGFR 82 08/21/2022   Lab Results  Component Value Date   CHOL 160 08/21/2022   Lab Results  Component Value Date   HDL 35 (L) 08/21/2022   Lab Results  Component Value Date   LDLCALC 30 08/21/2022   Lab Results  Component Value Date   TRIG 717 (HH) 08/21/2022   Lab Results  Component Value Date   CHOLHDL 4.6 08/21/2022   Lab Results  Component  Value Date   HGBA1C 10.7 (H) 08/21/2022      Assessment & Plan:   Problem List Items Addressed This Visit       Endocrine   Type 2 diabetes mellitus with hyperglycemia ([place]) - Primary    Lab Results  Component Value Date   HGBA1C 10.7 (H) 08/21/2022   New onset Likely due to oral steroid use, has stopped oral prednisone now Increased dose of metformin to 1000 mg twice daily Advised to follow diabetic diet On statin and ARB F/u CMP and lipid panel Diabetic foot exam: Today Diabetic eye exam: Advised to follow up with Ophthalmology for diabetic eye exam      Relevant Medications   UNABLE TO FIND   UNABLE TO FIND   metFORMIN (GLUCOPHAGE) 1000 MG tablet   Other Relevant Orders   Microalbumin / creatinine urine ratio   CMP14+EGFR   Hemoglobin A1c   Type 2 diabetes mellitus with diabetic neuropathy, without long-term current use of insulin (HCC)    Better controlled with Gabapentin to 300 mg BID If persistent, will refer to Neurology      Relevant Medications   metFORMIN (GLUCOPHAGE) 1000 MG tablet     Other   Blurry vision    Unclear etiology, but could be due to new onset type II DM Needs optometry evaluation and diabetic eye exam  Meds ordered this encounter  Medications   UNABLE TO FIND    Sig: Test Strips for One Touch Verio Flex. Use up to four times daily as directed. (FOR ICD-10 E10.9, E11.9).      Dispense:  100 each    Refill:  2   UNABLE TO FIND    Sig: Lancets Use up to four times daily as directed. (FOR ICD-10 E10.9, E11.9).      Dispense:  100 each    Refill:  2   metFORMIN (GLUCOPHAGE) 1000 MG tablet    Sig: Take 1 tablet (1,000 mg total) by mouth 2 (two) times daily with a meal.    Dispense:  180 tablet    Refill:  1    Follow-up: Return in about 3 months (around 11/29/2022) for DM and HTN.    Lindell Spar, MD

## 2022-08-29 NOTE — Assessment & Plan Note (Signed)
Lab Results  Component Value Date   HGBA1C 10.7 (H) 08/21/2022    New onset Likely due to oral steroid use, has stopped oral prednisone now Increased dose of metformin to 1000 mg twice daily Advised to follow diabetic diet On statin and ARB F/u CMP and lipid panel Diabetic foot exam: Today Diabetic eye exam: Advised to follow up with Ophthalmology for diabetic eye exam

## 2022-08-29 NOTE — Assessment & Plan Note (Signed)
Unclear etiology, but could be due to new onset type II DM Needs optometry evaluation and diabetic eye exam

## 2022-08-29 NOTE — Assessment & Plan Note (Signed)
Better controlled with Gabapentin to 300 mg BID If persistent, will refer to Neurology 

## 2022-09-01 LAB — MICROALBUMIN / CREATININE URINE RATIO
Creatinine, Urine: 124.4 mg/dL
Microalb/Creat Ratio: 18 mg/g creat (ref 0–29)
Microalbumin, Urine: 22.7 ug/mL

## 2022-09-04 ENCOUNTER — Ambulatory Visit (INDEPENDENT_AMBULATORY_CARE_PROVIDER_SITE_OTHER): Payer: 59 | Admitting: Orthopedic Surgery

## 2022-09-04 ENCOUNTER — Encounter: Payer: 59 | Admitting: Orthopedic Surgery

## 2022-09-04 ENCOUNTER — Other Ambulatory Visit: Payer: Self-pay | Admitting: Orthopedic Surgery

## 2022-09-04 ENCOUNTER — Encounter: Payer: Self-pay | Admitting: Orthopedic Surgery

## 2022-09-04 DIAGNOSIS — M7071 Other bursitis of hip, right hip: Secondary | ICD-10-CM

## 2022-09-04 MED ORDER — INDOMETHACIN 25 MG PO CAPS: 25.0000 mg | ORAL_CAPSULE | Freq: Three times a day (TID) | ORAL | 0 refills | Status: DC

## 2022-09-04 NOTE — Patient Instructions (Signed)
Take new medication as directed. This should not affect your blood sugars.   Indocin take 1 tablet by mouth three times daily as directed

## 2022-09-04 NOTE — Progress Notes (Signed)
Chief Complaint  Patient presents with   s/p hip replacement, right    DOS 05/16/22 Prednisone helped but made his BG go to over 600. Is now full diabetic   Mr. Snellgrove had to go to the ER from taking prednisone Dosepak with elevated glucose up to 600  He did get good relief from the prednisone  However now that he stopped it he is back to having the same pain on the lateral hip  I am going to switch him over to Indocin 25 3 times daily he is on Prilosec  Follow-up 2 weeks  Meds ordered this encounter  Medications   indomethacin (INDOCIN) 25 MG capsule    Sig: Take 1 capsule (25 mg total) by mouth 3 (three) times daily with meals.    Dispense:  60 capsule    Refill:  0

## 2022-09-06 ENCOUNTER — Encounter: Payer: Self-pay | Admitting: Gastroenterology

## 2022-09-12 ENCOUNTER — Telehealth: Payer: Self-pay | Admitting: Nutrition

## 2022-09-12 ENCOUNTER — Encounter: Payer: 59 | Admitting: Nutrition

## 2022-09-12 NOTE — Telephone Encounter (Signed)
Vm to call and r/s missed appt. 

## 2022-09-18 ENCOUNTER — Ambulatory Visit (INDEPENDENT_AMBULATORY_CARE_PROVIDER_SITE_OTHER): Payer: 59 | Admitting: Orthopedic Surgery

## 2022-09-18 DIAGNOSIS — Z96641 Presence of right artificial hip joint: Secondary | ICD-10-CM | POA: Diagnosis not present

## 2022-09-18 DIAGNOSIS — M7071 Other bursitis of hip, right hip: Secondary | ICD-10-CM | POA: Diagnosis not present

## 2022-09-18 MED ORDER — METHYLPREDNISOLONE ACETATE 40 MG/ML IJ SUSP
40.0000 mg | Freq: Once | INTRAMUSCULAR | Status: AC
  Administered 2022-09-18: 40 mg via INTRA_ARTICULAR

## 2022-09-18 NOTE — Patient Instructions (Signed)
You have received an injection of steroids into the joint. 15% of patients will have increased pain within the 24 hours postinjection.   This is transient and will go away.   We recommend that you use ice packs on the injection site for 20 minutes every 2 hours and extra strength Tylenol 2 tablets every 8 as needed until the pain resolves.  If you continue to have pain after taking the Tylenol and using the ice please call the office for further instructions.  Continue indocin for 14 days

## 2022-09-18 NOTE — Progress Notes (Signed)
Chief Complaint  Patient presents with   s/p hip replacement, right      DOS 05/16/22 Prednisone helped but made his BG go to over 600. Is now full diabetic    Mr. Nace had a right total hip he is doing pretty well but he is got pain over the greater trochanter  We tried some prednisone with a Dosepak we tried some Indocin he is a little better but still having pain over the greater trochanter  His exam shows tenderness directly over the greater trochanter so we are going to try an injection   Encounter Diagnoses  Name Primary?   Other bursitis of hip, right hip Yes   S/P hip replacement, right 05/16/22     Procedure note injection for right hip bursitis  Verbal consent was obtained for injection of the right hip  Timeout was completed to confirm the injection site  The medications used were 40 mg of Depo-Medrol and 1% lidocaine 3 cc  Anesthesia was provided by ethyl chloride and the skin was prepped with alcohol.  After cleaning the skin with alcohol a 25-gauge needle was used to inject the greater trochanteric bursa right hip   No complications were noted

## 2022-09-18 NOTE — Addendum Note (Signed)
Addended byCaffie Damme on: 09/18/2022 11:47 AM   Modules accepted: Orders

## 2022-09-20 DIAGNOSIS — E1142 Type 2 diabetes mellitus with diabetic polyneuropathy: Secondary | ICD-10-CM | POA: Diagnosis not present

## 2022-09-20 DIAGNOSIS — Z720 Tobacco use: Secondary | ICD-10-CM | POA: Diagnosis not present

## 2022-09-20 DIAGNOSIS — Z96649 Presence of unspecified artificial hip joint: Secondary | ICD-10-CM | POA: Diagnosis not present

## 2022-09-20 DIAGNOSIS — I10 Essential (primary) hypertension: Secondary | ICD-10-CM | POA: Diagnosis not present

## 2022-09-20 DIAGNOSIS — E785 Hyperlipidemia, unspecified: Secondary | ICD-10-CM | POA: Diagnosis not present

## 2022-09-20 DIAGNOSIS — Z886 Allergy status to analgesic agent status: Secondary | ICD-10-CM | POA: Diagnosis not present

## 2022-09-20 DIAGNOSIS — Z7984 Long term (current) use of oral hypoglycemic drugs: Secondary | ICD-10-CM | POA: Diagnosis not present

## 2022-09-20 DIAGNOSIS — K219 Gastro-esophageal reflux disease without esophagitis: Secondary | ICD-10-CM | POA: Diagnosis not present

## 2022-09-22 ENCOUNTER — Ambulatory Visit: Payer: 59 | Admitting: Orthopedic Surgery

## 2022-10-04 ENCOUNTER — Ambulatory Visit: Payer: 59 | Admitting: Nutrition

## 2022-10-06 ENCOUNTER — Other Ambulatory Visit: Payer: Self-pay | Admitting: Gastroenterology

## 2022-10-06 ENCOUNTER — Other Ambulatory Visit: Payer: Self-pay | Admitting: Internal Medicine

## 2022-10-06 DIAGNOSIS — K219 Gastro-esophageal reflux disease without esophagitis: Secondary | ICD-10-CM

## 2022-10-06 DIAGNOSIS — G629 Polyneuropathy, unspecified: Secondary | ICD-10-CM

## 2022-10-06 DIAGNOSIS — I10 Essential (primary) hypertension: Secondary | ICD-10-CM

## 2022-10-09 ENCOUNTER — Ambulatory Visit: Payer: 59 | Admitting: Orthopedic Surgery

## 2022-10-09 ENCOUNTER — Ambulatory Visit: Payer: 59 | Admitting: Nutrition

## 2022-10-11 ENCOUNTER — Ambulatory Visit (INDEPENDENT_AMBULATORY_CARE_PROVIDER_SITE_OTHER): Payer: 59 | Admitting: Orthopedic Surgery

## 2022-10-11 VITALS — BP 126/86 | HR 80 | Ht 71.0 in | Wt 190.0 lb

## 2022-10-11 DIAGNOSIS — M7071 Other bursitis of hip, right hip: Secondary | ICD-10-CM | POA: Diagnosis not present

## 2022-10-11 DIAGNOSIS — Z96641 Presence of right artificial hip joint: Secondary | ICD-10-CM | POA: Diagnosis not present

## 2022-10-11 NOTE — Progress Notes (Signed)
Chief Complaint  Patient presents with   Follow-up    Recheck on right hip, DOS 05-16-22.   Encounter Diagnoses  Name Primary?   S/P hip replacement, right 05/16/22    Other bursitis of hip, right hip Yes   56 year old male status post injection right hip for presumed bursitis  Patient says the injection helped he just gets a little stiffness in the morning especially when it is cold  In light of this we will see him at his annual follow-up for his 1 year postop x-ray of his right total hip

## 2022-10-25 NOTE — Progress Notes (Unsigned)
GI Office Note    Referring Provider: Lindell Spar, MD Primary Care Physician:  Lindell Spar, MD Primary Gastroenterologist: Elon Alas. Abbey Chatters, DO  Date:  10/26/2022  ID:  David Knox, DOB 07/26/1966, MRN [id number]   Chief Complaint   Chief Complaint  Patient presents with   Follow-up    Has a BM right after he eat's and it is loose    History of Present Illness  David Knox is a 56 y.o. male with a history of HTN, HLD, cocaine use in the past (abstinent for 3 years) presenting today for follow-up.  Initially seen in 2018 for epigastric pain, reflux, odynophagia, and colon cancer screening.  He reported severe indigestion for years along with vomiting and some odynophagia with intermittent recent hematemesis.  He was started on pantoprazole 40 mg daily with plans to perform EGD and colonoscopy however patient was no-show for preop appointment and did not follow-up.  Last seen in the office 06/29/2022 to discuss scheduling first-ever screening colonoscopy.  He denied any melena, BRBPR, unintentional weight loss, family history of colon cancer.  Did report postprandial bowel movements stating is a chronic issue.  Patient has a bowel movement 30 minutes after eating.  Mostly formed stools, sometimes runny.  Dairy products can cause looser stools.  Sometimes having 5-6 BMs daily without nocturnal stools.  Denied abdominal pain.  GERD controlled with omeprazole 20 mg over-the-counter daily.  Patient reported not being bothered by stools, lactose intolerance suspected.  Advised screening for celiac disease with serologies.  Prior thyroid levels within normal limits.  Scheduled for colonoscopy (urine drug screen advised).  Advised Lactaid prior to dairy consumption and advised to stop omeprazole start pantoprazole in case omeprazole causes frequent diarrhea.  IgA elevated, however TTG IgA negative.  Colonoscopy 07/27/2022: -Transverse and descending diverticulosis -4 mm polyp  in descending colon -Diminutive hyperplastic polyp, negative for dysplasia -Repeat colonoscopy in 10 years  Today: Bowel habits - Having loose bowel movements after every meal. Has been going on since about July or June of this year. Used to take 30 minutes to 1 hour and is occurring sooner now. Denies abdominal pain, nausea, vomiting. Happens about 5-10 minutes after he eats. Reports he still has his gallbladder and is having the same issue as his wife. The type of food does not matter. He tries to stay away from dairy, that is a definite trigger for him. Stools are anywhere between 5-7 on bristol chart. Started metformin in July/August right after his hip surgery in July. No abdominal pain or weight loss.   Doing more since his surgery and able to walk more.   GERD - controled without breakthrough. No dysphagia.    Current Outpatient Medications  Medication Sig Dispense Refill   acetaminophen (TYLENOL) 500 MG tablet Take 2 tablets (1,000 mg total) by mouth every 6 (six) hours. 30 tablet 0   amLODipine (NORVASC) 5 MG tablet Take 1 tablet (5 mg total) by mouth daily. 90 tablet 1   atorvastatin (LIPITOR) 20 MG tablet Take 1 tablet (20 mg total) by mouth daily. 90 tablet 1   blood glucose meter kit and supplies Dispense based on patient and insurance preference. Use up to four times daily as directed. (FOR ICD-10 E10.9, E11.9). 1 each 0   gabapentin (NEURONTIN) 300 MG capsule Take 1 capsule (300 mg total) by mouth 2 (two) times daily. 60 capsule 5   metFORMIN (GLUCOPHAGE) 1000 MG tablet Take 1 tablet (1,000 mg total) by  mouth 2 (two) times daily with a meal. 180 tablet 1   naproxen (NAPROSYN) 500 MG tablet TAKE 1 TABLET BY MOUTH TWICE A DAY WITH FOOD 60 tablet 2   pantoprazole (PROTONIX) 20 MG tablet TAKE 1 TABLET BY MOUTH DAILY BEFORE BREAKFAST. 90 tablet 3   telmisartan (MICARDIS) 40 MG tablet Take 1 tablet (40 mg total) by mouth daily. 90 tablet 1   UNABLE TO FIND Test Strips for One Touch  Verio Flex. Use up to four times daily as directed. (FOR ICD-10 E10.9, E11.9).   100 each 2   UNABLE TO FIND Lancets Use up to four times daily as directed. (FOR ICD-10 E10.9, E11.9).   100 each 2   indomethacin (INDOCIN) 25 MG capsule Take 1 capsule (25 mg total) by mouth 3 (three) times daily with meals. (Patient not taking: Reported on 10/26/2022) 60 capsule 0   No current facility-administered medications for this visit.    Past Medical History:  Diagnosis Date   Acid reflux    HLD (hyperlipidemia)    HTN (hypertension)     Past Surgical History:  Procedure Laterality Date   COLONOSCOPY WITH PROPOFOL N/A 07/27/2022   Procedure: COLONOSCOPY WITH PROPOFOL;  Surgeon: [name], DO;  Location: [facility];  Service: Endoscopy;  Laterality: N/A;  9:00 am  ASA 2   ESOPHAGOGASTRODUODENOSCOPY  2002   RMR: ulcerative reflux esophagitis, moderate sized hiatal hernia   HIP SURGERY Bilateral    as a child   POLYPECTOMY  07/27/2022   Procedure: POLYPECTOMY;  Surgeon: [name], DO;  Location: [facility];  Service: Endoscopy;;   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   Procedure: TOTAL HIP ARTHROPLASTY;  Surgeon: Carole Civil, MD;  Location: [facility];  Service: Orthopedics;  Laterality: Right;   WRIST SURGERY      Family History  Problem Relation Age of Onset   Diabetes Mother    Heart disease Mother    Diabetes Father    Heart disease Father    Colon cancer Neg Hx     Allergies as of 10/26/2022 - Review Complete 10/26/2022  Allergen Reaction Noted   Aspirin Nausea Only 05/18/2022    Social History   Socioeconomic History   Marital status: Single    Spouse name: Not on file   Number of children: Not on file   Years of education: Not on file   Highest education level: Not on file  Occupational History   Not on file  Tobacco Use   Smoking status: Every Day    Packs/day: 0.50    Types: Cigarettes   Smokeless tobacco: Never  Vaping Use   Vaping  Use: Never used  Substance and Sexual Activity   Alcohol use: Yes    Comment: beer and liquor twice weekly   Drug use: No    Comment: "crack" former- last about 3 years ago.   Sexual activity: Not on file  Other Topics Concern   Not on file  Social History Narrative   Not on file   Social Determinants of Health   Financial Resource Strain: Not on file  Food Insecurity: Not on file  Transportation Needs: Not on file  Physical Activity: Not on file  Stress: Not on file  Social Connections: Not on file     Review of Systems   Gen: Denies fever, chills, anorexia. Denies fatigue, weakness, weight loss.  CV: Denies chest pain, palpitations, syncope, peripheral edema, and claudication. Resp: Denies dyspnea at  rest, cough, wheezing, coughing up blood, and pleurisy. GI: See HPI Derm: Denies rash, itching, dry skin Psych: Denies depression, anxiety, memory loss, confusion. No homicidal or suicidal ideation.  Heme: Denies bruising, bleeding, and enlarged lymph nodes.   Physical Exam   BP 131/79 (BP Location: Right Arm, Patient Position: Sitting, Cuff Size: Normal)   Pulse 76   Temp 98.4 F (36.9 C) (Temporal)   Ht _0  (1.803 m)   Wt 199 lb (90.3 kg)   SpO2 98%   BMI 27.75 kg/m   General:   Alert and oriented. No distress noted. Pleasant and cooperative.  Head:  Normocephalic and atraumatic. Eyes:  Conjuctiva clear without scleral icterus. Mouth:  Oral mucosa pink and moist. Good dentition. No lesions. Lungs:  Clear to auscultation bilaterally. No wheezes, rales, or rhonchi. No distress.  Heart:  S1, S2 present without murmurs appreciated.  Abdomen:  +BS, soft, non-tender and non-distended. No rebound or guarding. No HSM or masses noted. Rectal: deferred Msk:  Symmetrical without gross deformities. Normal posture. Extremities:  Without edema. Neurologic:  Alert and  oriented x4 Psych:  Alert and cooperative. Normal mood and affect.   Assessment  David Knox  is a 56 y.o. male with a history of HTN, HLD, cocaine use in the past (abstinent for 3 years) presenting today for follow-up.  GERD: Controlled with pantoprazole 20 mg daily, refilled today.  Frequent loose stools: Had postprandial bowel movements within 5 to 10 minutes after eating.  Previously within 30 minutes of eating.  Having 4-5 bowel movements daily.  Has been avoiding lactose products.  Prior workup negative for celiac and with normal TSH.  Omeprazole was changed to pantoprazole after last visit and has not made a significant improvement.  Recent colonoscopy with single benign hyperplastic polyp.  Patient began taking metformin in June/July which is when his symptoms began to worsen.  Suspect frequent loose stools secondary to medication side effect and advised him to discuss this with his PCP.  For now I recommended for him to take Imodium once daily and increase up to 3 times daily prior to meals.  If metformin stopped and diarrhea continues or Imodium not effective, will consider fecal elastase to assess for pancreatic insufficiency or imaging to assess gallbladder as cause for loose stools.  History of colon polyps: Colonoscopy 07/19/2022 with single benign hyperplastic polyp within the descending colon.  Due for repeat in 10 years.  PLAN   Imodium 2 mg once daily, may increase to up to 3 times a day prior to meals. Discuss stopping or changing metformin given diarrhea.  Pantoprazole 20 mg daily, refilled today. Fecal elastase or possible abdominal imaging if imodium and stopping metformin not helpful. Progress report in 3-4 weeks.  Follow up in 3 months.     Venetia Night, MSN, FNP-BC, AGACNP-BC [facility]

## 2022-10-26 ENCOUNTER — Ambulatory Visit (INDEPENDENT_AMBULATORY_CARE_PROVIDER_SITE_OTHER): Payer: 59 | Admitting: Gastroenterology

## 2022-10-26 ENCOUNTER — Encounter: Payer: Self-pay | Admitting: Gastroenterology

## 2022-10-26 VITALS — BP 131/79 | HR 76 | Temp 98.4°F | Ht 71.0 in | Wt 199.0 lb

## 2022-10-26 DIAGNOSIS — Z86010 Personal history of colonic polyps: Secondary | ICD-10-CM

## 2022-10-26 DIAGNOSIS — R197 Diarrhea, unspecified: Secondary | ICD-10-CM

## 2022-10-26 DIAGNOSIS — K219 Gastro-esophageal reflux disease without esophagitis: Secondary | ICD-10-CM

## 2022-10-26 MED ORDER — PANTOPRAZOLE SODIUM 20 MG PO TBEC: 20.0000 mg | DELAYED_RELEASE_TABLET | Freq: Every day | ORAL | 3 refills | Status: DC

## 2022-10-26 NOTE — Patient Instructions (Signed)
Continue taking pantoprazole 20 mg daily, 30 minutes prior to breakfast.  I have sent a refill to [facility] for you.  For your loose stools, as we discussed I suspect given the timing that is related to your metformin.  I would recommend you discuss this with your primary care physician regarding changing medications.  For the meantime you may take Imodium 2 mg daily, increasing to 2 mg (1 tablet) prior to each meal if needed.  Reduce use if you begin to experience any constipation.  If you are able to stop your metformin after discussing with your primary care provider and you continue to experience diarrhea and urgency after meals, we will further evaluate your pancreas and possibly your gallbladder as well with some stool studies and/or imaging.  Please call me with a progress report in 3-4 weeks and let me know how you are doing.  Also keep me updated after you see your PCP.   Follow-up in 3 months, or sooner if needed.  I hope you have a wonderful Christmas and a happy new year!  It was a pleasure to see you today. I want to create trusting relationships with patients. If you receive a survey regarding your visit,  I greatly appreciate you taking time to fill this out on paper or through your MyChart. I value your feedback.  Brooke Bonito, MSN, FNP-BC, AGACNP-BC [facility]

## 2022-11-01 ENCOUNTER — Ambulatory Visit: Payer: 59 | Admitting: Gastroenterology

## 2022-11-30 ENCOUNTER — Ambulatory Visit: Payer: Self-pay | Admitting: Nutrition

## 2022-12-01 LAB — CMP14+EGFR
ALT: 17 IU/L (ref 0–44)
AST: 16 IU/L (ref 0–40)
Albumin/Globulin Ratio: 1.5 (ref 1.2–2.2)
Albumin: 4.2 g/dL (ref 3.8–4.9)
Alkaline Phosphatase: 72 IU/L (ref 44–121)
BUN/Creatinine Ratio: 9 (ref 9–20)
BUN: 10 mg/dL (ref 6–24)
Bilirubin Total: <0.2 mg/dL (ref 0.0–1.2)
CO2: 22 mmol/L (ref 20–29)
Calcium: 9.7 mg/dL (ref 8.7–10.2)
Chloride: 104 mmol/L (ref 96–106)
Creatinine, Ser: 1.13 mg/dL (ref 0.76–1.27)
Globulin, Total: 2.8 g/dL (ref 1.5–4.5)
Glucose: 112 mg/dL — ABNORMAL HIGH (ref 70–99)
Potassium: 4.1 mmol/L (ref 3.5–5.2)
Sodium: 141 mmol/L (ref 134–144)
Total Protein: 7 g/dL (ref 6.0–8.5)
eGFR: 77 mL/min/{1.73_m2} (ref >59)

## 2022-12-01 LAB — HEMOGLOBIN A1C
Est. average glucose Bld gHb Est-mCnc: 154 mg/dL
Hgb A1c MFr Bld: 7 % — ABNORMAL HIGH (ref 4.8–5.6)

## 2022-12-04 ENCOUNTER — Other Ambulatory Visit: Payer: Self-pay | Admitting: Internal Medicine

## 2022-12-04 DIAGNOSIS — E782 Mixed hyperlipidemia: Secondary | ICD-10-CM

## 2022-12-05 ENCOUNTER — Encounter: Payer: Self-pay | Admitting: Internal Medicine

## 2022-12-05 ENCOUNTER — Ambulatory Visit (INDEPENDENT_AMBULATORY_CARE_PROVIDER_SITE_OTHER): Payer: BLUE CROSS/BLUE SHIELD | Admitting: Internal Medicine

## 2022-12-05 VITALS — BP 131/76 | HR 84 | Ht 71.0 in | Wt 202.0 lb

## 2022-12-05 DIAGNOSIS — J449 Chronic obstructive pulmonary disease, unspecified: Secondary | ICD-10-CM

## 2022-12-05 DIAGNOSIS — I10 Essential (primary) hypertension: Secondary | ICD-10-CM

## 2022-12-05 DIAGNOSIS — H538 Other visual disturbances: Secondary | ICD-10-CM

## 2022-12-05 DIAGNOSIS — E1140 Type 2 diabetes mellitus with diabetic neuropathy, unspecified: Secondary | ICD-10-CM

## 2022-12-05 DIAGNOSIS — Z23 Encounter for immunization: Secondary | ICD-10-CM

## 2022-12-05 DIAGNOSIS — E1165 Type 2 diabetes mellitus with hyperglycemia: Secondary | ICD-10-CM | POA: Diagnosis not present

## 2022-12-05 DIAGNOSIS — I209 Angina pectoris, unspecified: Secondary | ICD-10-CM | POA: Diagnosis not present

## 2022-12-05 DIAGNOSIS — E0590 Thyrotoxicosis, unspecified without thyrotoxic crisis or storm: Secondary | ICD-10-CM

## 2022-12-05 DIAGNOSIS — E782 Mixed hyperlipidemia: Secondary | ICD-10-CM

## 2022-12-05 MED ORDER — NITROGLYCERIN 0.4 MG SL SUBL: 0.4000 mg | SUBLINGUAL_TABLET | SUBLINGUAL | 1 refills | Status: AC | PRN

## 2022-12-05 MED ORDER — ALBUTEROL SULFATE HFA 108 (90 BASE) MCG/ACT IN AERS: 2.0000 | INHALATION_SPRAY | Freq: Four times a day (QID) | RESPIRATORY_TRACT | 3 refills | Status: AC | PRN

## 2022-12-05 NOTE — Assessment & Plan Note (Signed)
Better controlled with Gabapentin to 300 mg BID If persistent, will refer to Neurology

## 2022-12-05 NOTE — Assessment & Plan Note (Signed)
EKG: Sinus rhythm.  No signs of active ischemia. His chest pain appears to be concerning for cardiac etiology Risk factors: Type II DM, HTN, HLD and smoking history On statin currently Advised to start enteric-coated aspirin (he had GI discomfort with regular aspirin) Nitroglycerin as needed for chest pain/discomfort Referred to cardiology

## 2022-12-05 NOTE — Assessment & Plan Note (Addendum)
Lab Results  Component Value Date   HGBA1C 7.0 (H) 11/30/2022   Uncontrolled, but improving Likely due to oral steroid use, has stopped oral prednisone now On metformin to 1000 mg twice daily Advised to follow diabetic diet On statin and ARB F/u CMP and lipid panel Diabetic foot exam: Today Diabetic eye exam: Advised to follow up with Ophthalmology for diabetic eye exam

## 2022-12-05 NOTE — Assessment & Plan Note (Signed)
Unclear etiology, but could be due to new onset type II DM Needs ophthalmology evaluation and diabetic eye exam

## 2022-12-05 NOTE — Assessment & Plan Note (Signed)
BP Readings from Last 1 Encounters:  10/26/22 131/79   Well-controlled with Amlodipine 5 mg QD and Telmisartan 40 mg QD Counseled for compliance with the medications Advised DASH diet and moderate exercise/walking, at least 150 mins/week

## 2022-12-05 NOTE — Assessment & Plan Note (Signed)
He has dyspnea upon exertion Quit smoking in 2022 Likely has chronic bronchitis Albuterol as needed for dyspnea or wheezing

## 2022-12-05 NOTE — Patient Instructions (Addendum)
Please start taking Low-dose aspirin 81 mg once daily.  Please take Nitroglycerin for episode of chest pain or left arm heaviness.  Please use Albuterol inhaler as needed for shortness of breath or wheezing.  Please continue taking other medications as prescribed.  Please continue to follow low carb diet and ambulate as tolerated.

## 2022-12-05 NOTE — Assessment & Plan Note (Signed)
Lab Results  Component Value Date   TSH 0.821 04/18/2022   Normal free T4 Will recheck TSH and free T4 later

## 2022-12-05 NOTE — Assessment & Plan Note (Signed)
Check lipid profile On Atorvastatin 

## 2022-12-05 NOTE — Progress Notes (Signed)
Established Patient Office Visit  Subjective:  Patient ID: David Knox, male    DOB: 1966-09-02  Age: 57 y.o. MRN: [id number]  CC:  Chief Complaint  Patient presents with   Hypertension    Three month follow up on hypertension and diabetes. Patient has been experiencing tightness in chest when he walks certain distances    HPI David Knox is a 57 y.o. male with past medical history of HTN, type 2 DM, peripheral neuropathy, GERD, hip arthritis and tobacco abuse who presents for f/u of his chronic medical conditions.  Chest pain: He c/o 2 episodes of chest tightness with left arm heaviness upon exertion in the last month, lasting for 5-10 mins. He tried to walk uphill with his trash cane and felt chest tightness. He reports dyspnea as well, and had to stop to catch breath. He has quit smoking now, but has > 30 pack-year smoking history.  Type II DM: He was recently diagnosed with type II DM, likely due to oral steroid use. His HbA1c is 7.0 now. He used to be in prediabetes range till 06/23, HbA1c 6.0, but it increased to 10.7 in 10/23. He was placed on metformin 500 mg daily.  He had a visit with Dr. Court Joy, and has been placed on metformin 1000 mg twice daily now. He still complains of excessive thirst and blurry vision. He has not seen Optometrist yet.  HTN: BP is well-controlled. Takes medications regularly. Patient denies headache, dizziness, or palpitations.   He has chronic right hip pain, s/p right THA.  He takes naproxen as needed for pain.  He had steroid injection for hip pain as well, which has helped him.  He still complains of bilateral feet burning pain, but is better with gabapentin 300 mg twice daily.  He denies any claudication symptoms.    Past Medical History:  Diagnosis Date   Acid reflux    HLD (hyperlipidemia)    HTN (hypertension)     Past Surgical History:  Procedure Laterality Date   COLONOSCOPY WITH PROPOFOL N/A 07/27/2022   Procedure:  COLONOSCOPY WITH PROPOFOL;  Surgeon: [name], DO;  [facility]: [facility];  Service: Endoscopy;  Laterality: N/A;  9:00 am  ASA 2   ESOPHAGOGASTRODUODENOSCOPY  2002   RMR: ulcerative reflux esophagitis, moderate sized hiatal hernia   HIP SURGERY Bilateral    as a child   POLYPECTOMY  07/27/2022   Procedure: POLYPECTOMY;  Surgeon: [name], DO;  [facility]: [facility];  Service: Endoscopy;;   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   Procedure: TOTAL HIP ARTHROPLASTY;  Surgeon: Carole Civil, MD;  Location: [facility];  Service: Orthopedics;  Laterality: Right;   WRIST SURGERY      Family History  Problem Relation Age of Onset   Diabetes Mother    Heart disease Mother    Diabetes Father    Heart disease Father    Colon cancer Neg Hx     Social History   Socioeconomic History   Marital status: Single    Spouse name: Not on file   Number of children: Not on file   Years of education: Not on file   Highest education level: Not on file  Occupational History   Not on file  Tobacco Use   Smoking status: Every Day    Packs/day: 0.50    Types: Cigarettes   Smokeless tobacco: Never  Vaping Use   Vaping Use: Never used  Substance and Sexual Activity  Alcohol use: Yes    Comment: beer and liquor twice weekly   Drug use: No    Comment: "crack" former- last about 3 years ago.   Sexual activity: Not on file  Other Topics Concern   Not on file  Social History Narrative   Not on file   Social Determinants of Health   Financial Resource Strain: Not on file  Food Insecurity: Not on file  Transportation Needs: Not on file  Physical Activity: Not on file  Stress: Not on file  Social Connections: Not on file  Intimate Partner Violence: Not on file    Outpatient Medications Prior to Visit  Medication Sig Dispense Refill   acetaminophen (TYLENOL) 500 MG tablet Take 2 tablets (1,000 mg total) by mouth every 6 (six) hours. 30 tablet 0   amLODipine (NORVASC)  5 MG tablet Take 1 tablet (5 mg total) by mouth daily. 90 tablet 1   atorvastatin (LIPITOR) 20 MG tablet TAKE 1 TABLET BY MOUTH EVERY DAY 90 tablet 1   blood glucose meter kit and supplies Dispense based on patient and insurance preference. Use up to four times daily as directed. (FOR ICD-10 E10.9, E11.9). 1 each 0   gabapentin (NEURONTIN) 300 MG capsule Take 1 capsule (300 mg total) by mouth 2 (two) times daily. 60 capsule 5   metFORMIN (GLUCOPHAGE) 1000 MG tablet Take 1 tablet (1,000 mg total) by mouth 2 (two) times daily with a meal. 180 tablet 1   naproxen (NAPROSYN) 500 MG tablet TAKE 1 TABLET BY MOUTH TWICE A DAY WITH FOOD 60 tablet 2   pantoprazole (PROTONIX) 20 MG tablet Take 1 tablet (20 mg total) by mouth daily before breakfast. 90 tablet 3   telmisartan (MICARDIS) 40 MG tablet Take 1 tablet (40 mg total) by mouth daily. 90 tablet 1   UNABLE TO FIND Test Strips for One Touch Verio Flex. Use up to four times daily as directed. (FOR ICD-10 E10.9, E11.9).   100 each 2   UNABLE TO FIND Lancets Use up to four times daily as directed. (FOR ICD-10 E10.9, E11.9).   100 each 2   indomethacin (INDOCIN) 25 MG capsule Take 1 capsule (25 mg total) by mouth 3 (three) times daily with meals. (Patient not taking: Reported on 10/26/2022) 60 capsule 0   No facility-administered medications prior to visit.    Allergies  Allergen Reactions   Aspirin Nausea Only    ROS Review of Systems  Constitutional:  Negative for chills and fever.  HENT:  Negative for congestion and sore throat.   Eyes:  Positive for visual disturbance. Negative for pain and discharge.  Respiratory:  Negative for cough and shortness of breath.   Cardiovascular:  Negative for chest pain and palpitations.  Gastrointestinal:  Negative for diarrhea, nausea and vomiting.  Endocrine: Negative for polydipsia and polyuria.  Genitourinary:  Negative for dysuria and hematuria.  Musculoskeletal:  Positive for arthralgias and back pain.  Negative for neck pain and neck stiffness.  Skin:  Negative for rash.  Neurological:  Positive for numbness (In left arm and feet). Negative for dizziness, weakness and headaches.  Psychiatric/Behavioral:  Negative for agitation and behavioral problems.       Objective:    Physical Exam Vitals reviewed.  Constitutional:      General: He is not in acute distress.    Appearance: He is not diaphoretic.  HENT:     Head: Normocephalic and atraumatic.     Nose: Nose normal.  Mouth/Throat:     Mouth: Mucous membranes are moist.  Eyes:     General: No scleral icterus.    Extraocular Movements: Extraocular movements intact.  Neck:     Vascular: No carotid bruit.  Cardiovascular:     Rate and Rhythm: Normal rate and regular rhythm.     Pulses: Normal pulses.     Heart sounds: Normal heart sounds. No murmur heard. Pulmonary:     Breath sounds: Normal breath sounds. No wheezing or rales.  Musculoskeletal:     Cervical back: Neck supple. No tenderness.     Right lower leg: No edema.     Left lower leg: No edema.  Skin:    General: Skin is warm.     Findings: No rash.  Neurological:     General: No focal deficit present.     Mental Status: He is alert and oriented to person, place, and time.     Cranial Nerves: No cranial nerve deficit.     Motor: No weakness.  Psychiatric:        Mood and Affect: Mood normal.        Behavior: Behavior normal.     BP 131/76 (BP Location: Right Arm, Patient Position: Sitting, Cuff Size: Large)   Pulse 84   Ht 5\' 11"  (1.803 m)   Wt 202 lb (91.6 kg)   SpO2 97%   BMI 28.17 kg/m  Wt Readings from Last 3 Encounters:  12/05/22 202 lb (91.6 kg)  10/26/22 199 lb (90.3 kg)  10/11/22 190 lb (86.2 kg)    Lab Results  Component Value Date   TSH 0.821 04/18/2022   Lab Results  Component Value Date   WBC 9.6 08/21/2022   HGB 13.5 08/21/2022   HCT 41.8 08/21/2022   MCV 87 08/21/2022   PLT 372 08/21/2022   Lab Results  Component Value  Date   NA 141 11/30/2022   K 4.1 11/30/2022   CO2 22 11/30/2022   GLUCOSE 112 (H) 11/30/2022   BUN 10 11/30/2022   CREATININE 1.13 11/30/2022   BILITOT <0.2 11/30/2022   ALKPHOS 72 11/30/2022   AST 16 11/30/2022   ALT 17 11/30/2022   PROT 7.0 11/30/2022   ALBUMIN 4.2 11/30/2022   CALCIUM 9.7 11/30/2022   ANIONGAP 11 08/21/2022   EGFR 77 11/30/2022   Lab Results  Component Value Date   CHOL 160 08/21/2022   Lab Results  Component Value Date   HDL 35 (L) 08/21/2022   Lab Results  Component Value Date   LDLCALC 30 08/21/2022   Lab Results  Component Value Date   TRIG 717 ([place]) 08/21/2022   Lab Results  Component Value Date   CHOLHDL 4.6 08/21/2022   Lab Results  Component Value Date   HGBA1C 7.0 (H) 11/30/2022      Assessment & Plan:   Problem List Items Addressed This Visit       Cardiovascular and Mediastinum   HTN (hypertension)    BP Readings from Last 1 Encounters:  10/26/22 131/79  Well-controlled with Amlodipine 5 mg QD and Telmisartan 40 mg QD Counseled for compliance with the medications Advised DASH diet and moderate exercise/walking, at least 150 mins/week      Relevant Medications   nitroGLYCERIN (NITROSTAT) 0.4 MG SL tablet   Angina pectoris (HCC)    EKG: Sinus rhythm.  No signs of active ischemia. His chest pain appears to be concerning for cardiac etiology Risk factors: Type II DM, HTN, HLD and smoking  history On statin currently Advised to start enteric-coated aspirin (he had GI discomfort with regular aspirin) Nitroglycerin as needed for chest pain/discomfort Referred to cardiology      Relevant Medications   nitroGLYCERIN (NITROSTAT) 0.4 MG SL tablet   Other Relevant Orders   Ambulatory referral to Cardiology   EKG 12-Lead (Completed)     Respiratory   Chronic obstructive pulmonary disease (HCC)    He has dyspnea upon exertion Quit smoking in 2022 Likely has chronic bronchitis Albuterol as needed for dyspnea or wheezing       Relevant Medications   albuterol (VENTOLIN HFA) 108 (90 Base) MCG/ACT inhaler     Endocrine   Subclinical hyperthyroidism    Lab Results  Component Value Date   TSH 0.821 04/18/2022  Normal free T4 Will recheck TSH and free T4 later      Type 2 diabetes mellitus with hyperglycemia (HCC) - Primary    Lab Results  Component Value Date   HGBA1C 7.0 (H) 11/30/2022  Uncontrolled, but improving Likely due to oral steroid use, has stopped oral prednisone now On metformin to 1000 mg twice daily Advised to follow diabetic diet On statin and ARB F/u CMP and lipid panel Diabetic foot exam: Today Diabetic eye exam: Advised to follow up with Ophthalmology for diabetic eye exam      Relevant Orders   Ambulatory referral to Ophthalmology   Type 2 diabetes mellitus with diabetic neuropathy, without long-term current use of insulin (HCC)    Better controlled with Gabapentin to 300 mg BID If persistent, will refer to Neurology        Other   Mixed hyperlipidemia    Check lipid profile On Atorvastatin      Relevant Medications   nitroGLYCERIN (NITROSTAT) 0.4 MG SL tablet   Blurry vision    Unclear etiology, but could be due to new onset type II DM Needs ophthalmology evaluation and diabetic eye exam      Relevant Orders   Ambulatory referral to Ophthalmology   Other Visit Diagnoses     Need for zoster vaccination       Relevant Orders   Zoster Recombinant (Shingrix ) (Completed)      Meds ordered this encounter  Medications   albuterol (VENTOLIN HFA) 108 (90 Base) MCG/ACT inhaler    Sig: Inhale 2 puffs into the lungs every 6 (six) hours as needed for wheezing or shortness of breath.    Dispense:  8 g    Refill:  3    Okay to substitute to generic/formulary Albuterol.   nitroGLYCERIN (NITROSTAT) 0.4 MG SL tablet    Sig: Place 1 tablet (0.4 mg total) under the tongue every 5 (five) minutes as needed for chest pain.    Dispense:  10 tablet    Refill:  1     Follow-up: Return in about 4 months (around 04/05/2023) for HTN and DM.    Anabel Halon, MD

## 2022-12-11 ENCOUNTER — Other Ambulatory Visit: Payer: Self-pay | Admitting: Orthopedic Surgery

## 2022-12-15 ENCOUNTER — Encounter: Payer: Self-pay | Admitting: Gastroenterology

## 2022-12-25 LAB — HM DIABETES EYE EXAM

## 2023-01-02 ENCOUNTER — Emergency Department (HOSPITAL_COMMUNITY)
Admission: EM | Admit: 2023-01-02 | Discharge: 2023-01-02 | Discharge status: Against Medical Advice | Payer: BLUE CROSS/BLUE SHIELD | Attending: Emergency Medicine | Admitting: Emergency Medicine

## 2023-01-02 ENCOUNTER — Other Ambulatory Visit: Payer: Self-pay

## 2023-01-02 ENCOUNTER — Encounter (HOSPITAL_COMMUNITY): Payer: Self-pay

## 2023-01-02 ENCOUNTER — Ambulatory Visit: Payer: BLUE CROSS/BLUE SHIELD | Admitting: Internal Medicine

## 2023-01-02 DIAGNOSIS — K920 Hematemesis: Secondary | ICD-10-CM | POA: Diagnosis present

## 2023-01-02 DIAGNOSIS — Z5321 Procedure and treatment not carried out due to patient leaving prior to being seen by health care provider: Secondary | ICD-10-CM | POA: Insufficient documentation

## 2023-01-02 DIAGNOSIS — K921 Melena: Secondary | ICD-10-CM | POA: Insufficient documentation

## 2023-01-02 NOTE — ED Triage Notes (Signed)
Reports Sunday morning started having black vomit and black stools.  Denies abd pain.  Patient reports he did take pepto last night.

## 2023-01-02 NOTE — ED Notes (Signed)
Patient took a phone call and told another patient he was leaving. Unable to locate patient in ER at this time.

## 2023-01-04 ENCOUNTER — Encounter: Payer: Self-pay | Admitting: Radiology

## 2023-01-05 ENCOUNTER — Other Ambulatory Visit: Payer: Self-pay | Admitting: Internal Medicine

## 2023-01-05 DIAGNOSIS — G629 Polyneuropathy, unspecified: Secondary | ICD-10-CM

## 2023-01-09 NOTE — H&P (View-Only) (Signed)
GI Office Note    Referring Provider: Lindell Spar, MD Primary Care Physician:  Lindell Spar, MD Primary Gastroenterologist: Elon Alas. Abbey Chatters, DO  Date:  01/10/2023  ID:  David Knox, DOB [birth year], MRN [id number]   Chief Complaint   Chief Complaint  Patient presents with   Follow-up    Follow up on GERD and diarrhea   History of Present Illness  David Knox is a 57 y.o. male with a history of HTN, HLD, cocaine use in the past (abstinent for 3-4 years) presenting today for follow up.   Initially seen in 2018 for epigastric pain, reflux, odynophagia, and colon cancer screening.  He reported severe indigestion for years along with vomiting and some odynophagia with intermittent recent hematemesis.  He was started on pantoprazole 40 mg daily with plans to perform EGD and colonoscopy however patient was no-show for preop appointment and did not follow-up.   Office visit 06/29/2022 to discuss scheduling first-ever screening colonoscopy.  He denied any melena, BRBPR, unintentional weight loss, family history of colon cancer.  Did report postprandial bowel movements stating is a chronic issue.  Patient has a bowel movement 30 minutes after eating.  Mostly formed stools, sometimes runny.  Dairy products can cause looser stools.  Sometimes having 5-6 BMs daily without nocturnal stools.  Denied abdominal pain.  GERD controlled with omeprazole 20 mg over-the-counter daily.  Patient reported not being bothered by stools, lactose intolerance suspected.  Advised screening for celiac disease with serologies.  Prior thyroid levels within normal limits.  Scheduled for colonoscopy (urine drug screen advised).  Advised Lactaid prior to dairy consumption and advised to stop omeprazole start pantoprazole in case omeprazole causes frequent diarrhea.   IgA elevated, however TTG IgA negative.   Colonoscopy 07/27/2022: -Transverse and descending diverticulosis -4 mm polyp in descending  colon -Diminutive hyperplastic polyp, negative for dysplasia -Repeat colonoscopy in 10 years  Last office visit 10/26/22. Having loose BM after every meal. Occurring sooner after meals. Still has gallbladder and reports this is same issues as his wife. Tries to avoid dairy products. Stools are bristol 5-7. Started metformin in July /August 2023 after hip surgery. Originally stated symptoms began in July/August of 2023. GERD controlled. Advised imodium as needed. Advised to discuss stopping metformin with PCP. Continue pantoprazole daily. Advised potential further workup with fecal elastase and possible abdominal imaging if imodium and stopping metformin not helpful.   Today: Still on metformin?  Diarrhea - Having a BM once per day, sometimes twice per day. Still on metformin. Frequency just started getting better and having more solid stools. Every now and then he has some watery stools. Having bristol 3 stools. No changes in diet. No abdominal pain.   GERD - Taking pantoprazole once per day. No dysphagia. No nausea or vomiting. No chronic NSAID use.Does drink alcohol about twice per week. Smokers about a half pack per day.   About 2 weeks ago he had some black stool and had some black vomiting that occurred twice and the melena occurred for a day and a half  or 2 days and then that stopped. Had no pain during that time. Has never had an EGD. No fevers, chills. No nausea just urge to vomit hit him.    No chest pain, shortenss of breath, dizziness, lightheadedness.   Current Outpatient Medications  Medication Sig Dispense Refill   acetaminophen (TYLENOL) 500 MG tablet Take 2 tablets (1,000 mg total) by mouth every 6 (six) hours. 30 tablet  0   albuterol (VENTOLIN HFA) 108 (90 Base) MCG/ACT inhaler Inhale 2 puffs into the lungs every 6 (six) hours as needed for wheezing or shortness of breath. 8 g 3   amLODipine (NORVASC) 5 MG tablet Take 1 tablet (5 mg total) by mouth daily. 90 tablet 1    atorvastatin (LIPITOR) 20 MG tablet TAKE 1 TABLET BY MOUTH EVERY DAY 90 tablet 1   blood glucose meter kit and supplies Dispense based on patient and insurance preference. Use up to four times daily as directed. (FOR ICD-10 E10.9, E11.9). 1 each 0   gabapentin (NEURONTIN) 300 MG capsule TAKE 1 CAPSULE BY MOUTH TWICE A DAY 60 capsule 5   metFORMIN (GLUCOPHAGE) 1000 MG tablet Take 1 tablet (1,000 mg total) by mouth 2 (two) times daily with a meal. 180 tablet 1   naproxen (NAPROSYN) 500 MG tablet TAKE 1 TABLET BY MOUTH TWICE A DAY WITH FOOD 60 tablet 2   pantoprazole (PROTONIX) 20 MG tablet Take 1 tablet (20 mg total) by mouth daily before breakfast. 90 tablet 3   telmisartan (MICARDIS) 40 MG tablet Take 1 tablet (40 mg total) by mouth daily. 90 tablet 1   nitroGLYCERIN (NITROSTAT) 0.4 MG SL tablet Place 1 tablet (0.4 mg total) under the tongue every 5 (five) minutes as needed for chest pain. (Patient not taking: Reported on 01/10/2023) 10 tablet 1   UNABLE TO FIND Test Strips for One Touch Verio Flex. Use up to four times daily as directed. (FOR ICD-10 E10.9, E11.9).   (Patient not taking: Reported on 01/10/2023) 100 each 2   UNABLE TO FIND Lancets Use up to four times daily as directed. (FOR ICD-10 E10.9, E11.9).   (Patient not taking: Reported on 01/10/2023) 100 each 2   No current facility-administered medications for this visit.    Past Medical History:  Diagnosis Date   Acid reflux    HLD (hyperlipidemia)    HTN (hypertension)     Past Surgical History:  Procedure Laterality Date   COLONOSCOPY WITH PROPOFOL N/A 07/27/2022   Procedure: COLONOSCOPY WITH PROPOFOL;  Surgeon: [name], DO;  [facility];  Service: Endoscopy;  Laterality: N/A;  9:00 am  ASA 2   ESOPHAGOGASTRODUODENOSCOPY  2002   RMR: ulcerative reflux esophagitis, moderate sized hiatal hernia   HIP SURGERY Bilateral    as a child   POLYPECTOMY  07/27/2022   Procedure: POLYPECTOMY;  Surgeon: [name],  DO;  [facility];  Service: Endoscopy;;   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   Procedure: TOTAL HIP ARTHROPLASTY;  Surgeon: Carole Civil, MD;  Location: [facility];  Service: Orthopedics;  Laterality: Right;   WRIST SURGERY      Family History  Problem Relation Age of Onset   Diabetes Mother    Heart disease Mother    Diabetes Father    Heart disease Father    Colon cancer Neg Hx     Allergies as of 01/10/2023 - Review Complete 01/10/2023  Allergen Reaction Noted   Aspirin Nausea Only 05/18/2022    Social History   Socioeconomic History   Marital status: Single    Spouse name: Not on file   Number of children: Not on file   Years of education: Not on file   Highest education level: Not on file  Occupational History   Not on file  Tobacco Use   Smoking status: Every Day    Packs/day: 0.50    Types: Cigarettes  Smokeless tobacco: Never  Vaping Use   Vaping Use: Never used  Substance and Sexual Activity   Alcohol use: Yes    Comment: beer and liquor twice weekly   Drug use: No    Comment: "crack" former- last about 3 years ago.   Sexual activity: Not on file  Other Topics Concern   Not on file  Social History Narrative   Not on file   Social Determinants of Health   Financial Resource Strain: Not on file  Food Insecurity: Not on file  Transportation Needs: Not on file  Physical Activity: Not on file  Stress: Not on file  Social Connections: Not on file     Review of Systems   Gen: Denies fever, chills, anorexia. Denies fatigue, weakness, weight loss.  CV: Denies chest pain, palpitations, syncope, peripheral edema, and claudication. Resp: Denies dyspnea at rest, cough, wheezing, coughing up blood, and pleurisy. GI: See HPI Derm: Denies rash, itching, dry skin Psych: Denies depression, anxiety, memory loss, confusion. No homicidal or suicidal ideation.  Heme: Denies bruising, bleeding, and enlarged lymph nodes.   Physical Exam    BP 138/87   Pulse 78   Temp (!) 97.5 F (36.4 C)   Ht '5\' 11"'$  (1.803 m)   Wt 199 lb 9.6 oz (90.5 kg)   BMI 27.84 kg/m   General:   Alert and oriented. No distress noted. Pleasant and cooperative.  Head:  Normocephalic and atraumatic. Eyes:  Conjuctiva clear without scleral icterus. Mouth:  Oral mucosa pink and moist. Good dentition. No lesions. Lungs:  Clear to auscultation bilaterally. No wheezes, rales, or rhonchi. No distress.  Heart:  S1, S2 present without murmurs appreciated.  Abdomen:  +BS, soft, non-tender and non-distended. No rebound or guarding. No HSM or masses noted. Rectal: deferred  Msk:  Symmetrical without gross deformities. Normal posture. Extremities:  Without edema. Neurologic:  Alert and  oriented x4 Psych:  Alert and cooperative. Normal mood and affect.   Assessment  David Knox is a 57 y.o. male with a history of HTN, HLD, cocaine use in the past (abstinent for 3-4 years) presenting today for follow up.  GERD: Controlled on pantoprazole 20 mg once daily.  Denies any nausea, vomiting, dysphagia.  As reported below he did have an episode of coffee-ground emesis and melena 2 weeks ago.  Denies any abdominal pain, lack of appetite, early satiety.  Melena, coffee ground emesis: 2 weeks ago he had a couple episodes of coffee-ground emesis followed by 1.5-2 days melena.  This was sudden onset.  He denied any abdominal pain or nausea with this.  No triggers identified.  Denied any NSAID use.  Does drink alcohol twice per week and smokes half a pack per day.  Will perform EGD for further evaluation of esophagitis, gastritis, duodenitis, peptic ulcer disease, AVM, etc.  No need for dilation as he does not have any dysphagia.  As above he will stay on pantoprazole 20 mg daily.  Advised he may need to increase dose pending findings.  Diarrhea: Previously with 4-5 bowel movements daily occurring 5 to 10 minutes after eating.  He was avoiding lactose products.  He had  previous negative workup for celiac and normal TSH.  Originally suspected possible medication side effect given symptoms began when he started metformin.  Since his last visit he is not currently taking any Imodium and he has had improvement in his stools.  He is currently having [place] 3 stools once or twice daily with some  occasional watery stools in between.  Also without any abdominal pain.  No other alarm symptoms and no nocturnal stools.  History of colon polyps: Colonoscopy in September 2023 with diminutive hyperplastic polyp in transverse and descending diverticulosis.  Advise repeat in 10 years.  Will be due in 2033.  PLAN   Continue pantoprazole 20 mg daily.  Proceed with upper endoscopy with propofol by Dr. Abbey Chatters in near future: the risks, benefits, and alternatives have been discussed with the patient in detail. The patient states understanding and desires to proceed. ASA 2 No metformin night prior to or morning of Follow-up 2-3 months post procedure    Venetia Night, MSN, FNP-BC, AGACNP-BC [facility]

## 2023-01-09 NOTE — Progress Notes (Unsigned)
GI Office Note    Referring Provider: Lindell Spar, MD Primary Care Physician:  Lindell Spar, MD Primary Gastroenterologist: Elon Alas. Abbey Chatters, DO  Date:  01/10/2023  ID:  David Knox, DOB [birth year], MRN [id number]   Chief Complaint   Chief Complaint  Patient presents with   Follow-up    Follow up on GERD and diarrhea   History of Present Illness  David Knox is a 57 y.o. male with a history of HTN, HLD, cocaine use in the past (abstinent for 3-4 years) presenting today for follow up.   Initially seen in 2018 for epigastric pain, reflux, odynophagia, and colon cancer screening.  He reported severe indigestion for years along with vomiting and some odynophagia with intermittent recent hematemesis.  He was started on pantoprazole 40 mg daily with plans to perform EGD and colonoscopy however patient was no-show for preop appointment and did not follow-up.   Office visit 06/29/2022 to discuss scheduling first-ever screening colonoscopy.  He denied any melena, BRBPR, unintentional weight loss, family history of colon cancer.  Did report postprandial bowel movements stating is a chronic issue.  Patient has a bowel movement 30 minutes after eating.  Mostly formed stools, sometimes runny.  Dairy products can cause looser stools.  Sometimes having 5-6 BMs daily without nocturnal stools.  Denied abdominal pain.  GERD controlled with omeprazole 20 mg over-the-counter daily.  Patient reported not being bothered by stools, lactose intolerance suspected.  Advised screening for celiac disease with serologies.  Prior thyroid levels within normal limits.  Scheduled for colonoscopy (urine drug screen advised).  Advised Lactaid prior to dairy consumption and advised to stop omeprazole start pantoprazole in case omeprazole causes frequent diarrhea.   IgA elevated, however TTG IgA negative.   Colonoscopy 07/27/2022: -Transverse and descending diverticulosis -4 mm polyp in descending  colon -Diminutive hyperplastic polyp, negative for dysplasia -Repeat colonoscopy in 10 years  Last office visit 10/26/22. Having loose BM after every meal. Occurring sooner after meals. Still has gallbladder and reports this is same issues as his wife. Tries to avoid dairy products. Stools are bristol 5-7. Started metformin in July /August 2023 after hip surgery. Originally stated symptoms began in July/August of 2023. GERD controlled. Advised imodium as needed. Advised to discuss stopping metformin with PCP. Continue pantoprazole daily. Advised potential further workup with fecal elastase and possible abdominal imaging if imodium and stopping metformin not helpful.   Today: Still on metformin?  Diarrhea - Having a BM once per day, sometimes twice per day. Still on metformin. Frequency just started getting better and having more solid stools. Every now and then he has some watery stools. Having bristol 3 stools. No changes in diet. No abdominal pain.   GERD - Taking pantoprazole once per day. No dysphagia. No nausea or vomiting. No chronic NSAID use.Does drink alcohol about twice per week. Smokers about a half pack per day.   About 2 weeks ago he had some black stool and had some black vomiting that occurred twice and the melena occurred for a day and a half  or 2 days and then that stopped. Had no pain during that time. Has never had an EGD. No fevers, chills. No nausea just urge to vomit hit him.    No chest pain, shortenss of breath, dizziness, lightheadedness.   Current Outpatient Medications  Medication Sig Dispense Refill   acetaminophen (TYLENOL) 500 MG tablet Take 2 tablets (1,000 mg total) by mouth every 6 (six) hours. 30 tablet  0   albuterol (VENTOLIN HFA) 108 (90 Base) MCG/ACT inhaler Inhale 2 puffs into the lungs every 6 (six) hours as needed for wheezing or shortness of breath. 8 g 3   amLODipine (NORVASC) 5 MG tablet Take 1 tablet (5 mg total) by mouth daily. 90 tablet 1    atorvastatin (LIPITOR) 20 MG tablet TAKE 1 TABLET BY MOUTH EVERY DAY 90 tablet 1   blood glucose meter kit and supplies Dispense based on patient and insurance preference. Use up to four times daily as directed. (FOR ICD-10 E10.9, E11.9). 1 each 0   gabapentin (NEURONTIN) 300 MG capsule TAKE 1 CAPSULE BY MOUTH TWICE A DAY 60 capsule 5   metFORMIN (GLUCOPHAGE) 1000 MG tablet Take 1 tablet (1,000 mg total) by mouth 2 (two) times daily with a meal. 180 tablet 1   naproxen (NAPROSYN) 500 MG tablet TAKE 1 TABLET BY MOUTH TWICE A DAY WITH FOOD 60 tablet 2   pantoprazole (PROTONIX) 20 MG tablet Take 1 tablet (20 mg total) by mouth daily before breakfast. 90 tablet 3   telmisartan (MICARDIS) 40 MG tablet Take 1 tablet (40 mg total) by mouth daily. 90 tablet 1   nitroGLYCERIN (NITROSTAT) 0.4 MG SL tablet Place 1 tablet (0.4 mg total) under the tongue every 5 (five) minutes as needed for chest pain. (Patient not taking: Reported on 01/10/2023) 10 tablet 1   UNABLE TO FIND Test Strips for One Touch Verio Flex. Use up to four times daily as directed. (FOR ICD-10 E10.9, E11.9).   (Patient not taking: Reported on 01/10/2023) 100 each 2   UNABLE TO FIND Lancets Use up to four times daily as directed. (FOR ICD-10 E10.9, E11.9).   (Patient not taking: Reported on 01/10/2023) 100 each 2   No current facility-administered medications for this visit.    Past Medical History:  Diagnosis Date   Acid reflux    HLD (hyperlipidemia)    HTN (hypertension)     Past Surgical History:  Procedure Laterality Date   COLONOSCOPY WITH PROPOFOL N/A 07/27/2022   Procedure: COLONOSCOPY WITH PROPOFOL;  Surgeon: [name], DO;  Location: [facility];  Service: Endoscopy;  Laterality: N/A;  9:00 am  ASA 2   ESOPHAGOGASTRODUODENOSCOPY  2002   RMR: ulcerative reflux esophagitis, moderate sized hiatal hernia   HIP SURGERY Bilateral    as a child   POLYPECTOMY  07/27/2022   Procedure: POLYPECTOMY;  Surgeon: [name],  DO;  Location: [facility];  Service: Endoscopy;;   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   Procedure: TOTAL HIP ARTHROPLASTY;  Surgeon: Carole Civil, MD;  Location: [facility];  Service: Orthopedics;  Laterality: Right;   WRIST SURGERY      Family History  Problem Relation Age of Onset   Diabetes Mother    Heart disease Mother    Diabetes Father    Heart disease Father    Colon cancer Neg Hx     Allergies as of 01/10/2023 - Review Complete 01/10/2023  Allergen Reaction Noted   Aspirin Nausea Only 05/18/2022    Social History   Socioeconomic History   Marital status: Single    Spouse name: Not on file   Number of children: Not on file   Years of education: Not on file   Highest education level: Not on file  Occupational History   Not on file  Tobacco Use   Smoking status: Every Day    Packs/day: 0.50    Types: Cigarettes  Smokeless tobacco: Never  Vaping Use   Vaping Use: Never used  Substance and Sexual Activity   Alcohol use: Yes    Comment: beer and liquor twice weekly   Drug use: No    Comment: "crack" former- last about 3 years ago.   Sexual activity: Not on file  Other Topics Concern   Not on file  Social History Narrative   Not on file   Social Determinants of Health   Financial Resource Strain: Not on file  Food Insecurity: Not on file  Transportation Needs: Not on file  Physical Activity: Not on file  Stress: Not on file  Social Connections: Not on file     Review of Systems   Gen: Denies fever, chills, anorexia. Denies fatigue, weakness, weight loss.  CV: Denies chest pain, palpitations, syncope, peripheral edema, and claudication. Resp: Denies dyspnea at rest, cough, wheezing, coughing up blood, and pleurisy. GI: See HPI Derm: Denies rash, itching, dry skin Psych: Denies depression, anxiety, memory loss, confusion. No homicidal or suicidal ideation.  Heme: Denies bruising, bleeding, and enlarged lymph nodes.   Physical Exam    BP 138/87   Pulse 78   Temp (!) 97.5 F (36.4 C)   Ht '5\' 11"'$  (1.803 m)   Wt 199 lb 9.6 oz (90.5 kg)   BMI 27.84 kg/m   General:   Alert and oriented. No distress noted. Pleasant and cooperative.  Head:  Normocephalic and atraumatic. Eyes:  Conjuctiva clear without scleral icterus. Mouth:  Oral mucosa pink and moist. Good dentition. No lesions. Lungs:  Clear to auscultation bilaterally. No wheezes, rales, or rhonchi. No distress.  Heart:  S1, S2 present without murmurs appreciated.  Abdomen:  +BS, soft, non-tender and non-distended. No rebound or guarding. No HSM or masses noted. Rectal: deferred  Msk:  Symmetrical without gross deformities. Normal posture. Extremities:  Without edema. Neurologic:  Alert and  oriented x4 Psych:  Alert and cooperative. Normal mood and affect.   Assessment  David Knox is a 57 y.o. male with a history of HTN, HLD, cocaine use in the past (abstinent for 3-4 years) presenting today for follow up.  GERD: Controlled on pantoprazole 20 mg once daily.  Denies any nausea, vomiting, dysphagia.  As reported below he did have an episode of coffee-ground emesis and melena 2 weeks ago.  Denies any abdominal pain, lack of appetite, early satiety.  Melena, coffee ground emesis: 2 weeks ago he had a couple episodes of coffee-ground emesis followed by 1.5-2 days melena.  This was sudden onset.  He denied any abdominal pain or nausea with this.  No triggers identified.  Denied any NSAID use.  Does drink alcohol twice per week and smokes half a pack per day.  Will perform EGD for further evaluation of esophagitis, gastritis, duodenitis, peptic ulcer disease, AVM, etc.  No need for dilation as he does not have any dysphagia.  As above he will stay on pantoprazole 20 mg daily.  Advised he may need to increase dose pending findings.  Diarrhea: Previously with 4-5 bowel movements daily occurring 5 to 10 minutes after eating.  He was avoiding lactose products.  He had  previous negative workup for celiac and normal TSH.  Originally suspected possible medication side effect given symptoms began when he started metformin.  Since his last visit he is not currently taking any Imodium and he has had improvement in his stools.  He is currently having Bristol 3 stools once or twice daily with some  occasional watery stools in between.  Also without any abdominal pain.  No other alarm symptoms and no nocturnal stools.  History of colon polyps: Colonoscopy in September 2023 with diminutive hyperplastic polyp in transverse and descending diverticulosis.  Advise repeat in 10 years.  Will be due in 2033.  PLAN   Continue pantoprazole 20 mg daily.  Proceed with upper endoscopy with propofol by Dr. Abbey Chatters in near future: the risks, benefits, and alternatives have been discussed with the patient in detail. The patient states understanding and desires to proceed. ASA 2 No metformin night prior to or morning of Follow-up 2-3 months post procedure    Venetia Night, MSN, FNP-BC, AGACNP-BC [facility]

## 2023-01-10 ENCOUNTER — Telehealth: Payer: Self-pay | Admitting: *Deleted

## 2023-01-10 ENCOUNTER — Encounter: Payer: Self-pay | Admitting: Gastroenterology

## 2023-01-10 ENCOUNTER — Ambulatory Visit (INDEPENDENT_AMBULATORY_CARE_PROVIDER_SITE_OTHER): Payer: Medicaid Other | Admitting: Gastroenterology

## 2023-01-10 ENCOUNTER — Encounter: Payer: Self-pay | Admitting: *Deleted

## 2023-01-10 VITALS — BP 138/87 | HR 78 | Temp 97.5°F | Ht 71.0 in | Wt 199.6 lb

## 2023-01-10 DIAGNOSIS — K219 Gastro-esophageal reflux disease without esophagitis: Secondary | ICD-10-CM | POA: Diagnosis not present

## 2023-01-10 DIAGNOSIS — Z860100 Personal history of colon polyps, unspecified: Secondary | ICD-10-CM

## 2023-01-10 DIAGNOSIS — R197 Diarrhea, unspecified: Secondary | ICD-10-CM | POA: Diagnosis not present

## 2023-01-10 DIAGNOSIS — K921 Melena: Secondary | ICD-10-CM | POA: Diagnosis not present

## 2023-01-10 DIAGNOSIS — K920 Hematemesis: Secondary | ICD-10-CM

## 2023-01-10 NOTE — Patient Instructions (Signed)
We are scheduling you for an upper endoscopy in the near future with Dr. Abbey Chatters.  Continue taking pantoprazole 20 mg once daily.  Depending on what we find during your endoscopy we may need to increase this dose.  We will plan to follow-up 2-3 months after the procedure, sooner if needed.  Please do not hesitate to call with any questions or concerns or any worsening of symptoms including if diarrhea returns.  If you have recurrence of black stools or coffee-ground emesis please also notify our office.  It was a pleasure to see you today. I want to create trusting relationships with patients. If you receive a survey regarding your visit,  I greatly appreciate you taking time to fill this out on paper or through your MyChart. I value your feedback.  Venetia Night, MSN, FNP-BC, AGACNP-BC [facility]

## 2023-01-10 NOTE — Telephone Encounter (Signed)
[employer] PA: Thank you for your online prior authorization/notification submission.  The prior authorization/notification reference number is: [id number].

## 2023-01-16 ENCOUNTER — Ambulatory Visit: Payer: 59 | Admitting: Internal Medicine

## 2023-01-18 ENCOUNTER — Other Ambulatory Visit: Payer: Self-pay

## 2023-01-18 ENCOUNTER — Ambulatory Visit (HOSPITAL_BASED_OUTPATIENT_CLINIC_OR_DEPARTMENT_OTHER): Payer: Medicaid Other | Admitting: Certified Registered Nurse Anesthetist

## 2023-01-18 ENCOUNTER — Ambulatory Visit (HOSPITAL_COMMUNITY): Payer: Medicaid Other | Admitting: Certified Registered Nurse Anesthetist

## 2023-01-18 ENCOUNTER — Other Ambulatory Visit: Payer: Self-pay | Admitting: Internal Medicine

## 2023-01-18 ENCOUNTER — Ambulatory Visit (HOSPITAL_COMMUNITY)
Admission: RE | Admit: 2023-01-18 | Discharge: 2023-01-18 | Disposition: A | Discharge status: Home / Self Care | Payer: Medicaid Other | Attending: Internal Medicine | Admitting: Internal Medicine

## 2023-01-18 ENCOUNTER — Encounter (HOSPITAL_COMMUNITY): Payer: Self-pay

## 2023-01-18 ENCOUNTER — Encounter (HOSPITAL_COMMUNITY)
Admission: RE | Disposition: A | Discharge status: Home / Self Care | Payer: Self-pay | Source: Home / Self Care | Attending: Internal Medicine

## 2023-01-18 DIAGNOSIS — K921 Melena: Secondary | ICD-10-CM | POA: Diagnosis present

## 2023-01-18 DIAGNOSIS — E0590 Thyrotoxicosis, unspecified without thyrotoxic crisis or storm: Secondary | ICD-10-CM | POA: Insufficient documentation

## 2023-01-18 DIAGNOSIS — K2980 Duodenitis without bleeding: Secondary | ICD-10-CM | POA: Diagnosis not present

## 2023-01-18 DIAGNOSIS — K449 Diaphragmatic hernia without obstruction or gangrene: Secondary | ICD-10-CM

## 2023-01-18 DIAGNOSIS — K2970 Gastritis, unspecified, without bleeding: Secondary | ICD-10-CM

## 2023-01-18 DIAGNOSIS — F17200 Nicotine dependence, unspecified, uncomplicated: Secondary | ICD-10-CM | POA: Insufficient documentation

## 2023-01-18 DIAGNOSIS — E119 Type 2 diabetes mellitus without complications: Secondary | ICD-10-CM | POA: Insufficient documentation

## 2023-01-18 DIAGNOSIS — K920 Hematemesis: Secondary | ICD-10-CM

## 2023-01-18 DIAGNOSIS — J449 Chronic obstructive pulmonary disease, unspecified: Secondary | ICD-10-CM | POA: Diagnosis not present

## 2023-01-18 DIAGNOSIS — K209 Esophagitis, unspecified without bleeding: Secondary | ICD-10-CM

## 2023-01-18 DIAGNOSIS — I10 Essential (primary) hypertension: Secondary | ICD-10-CM

## 2023-01-18 DIAGNOSIS — K2100 Gastro-esophageal reflux disease with esophagitis, without bleeding: Secondary | ICD-10-CM | POA: Diagnosis not present

## 2023-01-18 DIAGNOSIS — K219 Gastro-esophageal reflux disease without esophagitis: Secondary | ICD-10-CM

## 2023-01-18 HISTORY — PX: ESOPHAGOGASTRODUODENOSCOPY (EGD) WITH PROPOFOL: SHX5813

## 2023-01-18 HISTORY — PX: BIOPSY: SHX5522

## 2023-01-18 HISTORY — DX: Type 2 diabetes mellitus without complications: E11.9

## 2023-01-18 LAB — GLUCOSE, CAPILLARY: Glucose-Capillary: 92 mg/dL (ref 70–99)

## 2023-01-18 SURGERY — ESOPHAGOGASTRODUODENOSCOPY (EGD) WITH PROPOFOL
Anesthesia: General

## 2023-01-18 MED ORDER — PROPOFOL 10 MG/ML IV BOLUS
INTRAVENOUS | Status: DC | PRN
  Administered 2023-01-18: 100 mg via INTRAVENOUS
  Administered 2023-01-18: 40 mg via INTRAVENOUS
  Administered 2023-01-18: 30 mg via INTRAVENOUS

## 2023-01-18 MED ORDER — LIDOCAINE HCL (CARDIAC) PF 100 MG/5ML IV SOSY
PREFILLED_SYRINGE | INTRAVENOUS | Status: DC | PRN
  Administered 2023-01-18: 50 mg via INTRAVENOUS

## 2023-01-18 MED ORDER — LACTATED RINGERS IV SOLN: INTRAVENOUS | Status: DC

## 2023-01-18 MED ORDER — PANTOPRAZOLE SODIUM 40 MG PO TBEC: 40.0000 mg | DELAYED_RELEASE_TABLET | Freq: Two times a day (BID) | ORAL | 11 refills | Status: DC

## 2023-01-18 NOTE — Discharge Instructions (Signed)
EGD Discharge instructions Please read the instructions outlined below and refer to this sheet in the next few weeks. These discharge instructions provide you with general information on caring for yourself after you leave the hospital. Your doctor may also give you specific instructions. While your treatment has been planned according to the most current medical practices available, unavoidable complications occasionally occur. If you have any problems or questions after discharge, please call your doctor. ACTIVITY You may resume your regular activity but move at a slower pace for the next 24 hours.  Take frequent rest periods for the next 24 hours.  Walking will help expel (get rid of) the air and reduce the bloated feeling in your abdomen.  No driving for 24 hours (because of the anesthesia (medicine) used during the test).  You may shower.  Do not sign any important legal documents or operate any machinery for 24 hours (because of the anesthesia used during the test).  NUTRITION Drink plenty of fluids.  You may resume your normal diet.  Begin with a light meal and progress to your normal diet.  Avoid alcoholic beverages for 24 hours or as instructed by your caregiver.  MEDICATIONS You may resume your normal medications unless your caregiver tells you otherwise.  WHAT YOU CAN EXPECT TODAY You may experience abdominal discomfort such as a feeling of fullness or "gas" pains.  FOLLOW-UP Your doctor will discuss the results of your test with you.  SEEK IMMEDIATE MEDICAL ATTENTION IF ANY OF THE FOLLOWING OCCUR: Excessive nausea (feeling sick to your stomach) and/or vomiting.  Severe abdominal pain and distention (swelling).  Trouble swallowing.  Temperature over 101 F (37.8 C).  Rectal bleeding or vomiting of blood.    Your upper endoscopy revealed a small hiatal hernia.  Evidence of esophagitis related to reflux as well as possible Barrett's esophagus seen as well.  I took samples of  this area.  Also had inflammation throughout your entire stomach and first portion of your small bowel.  I also took samples in these areas to rule out infection with bacteria called H. pylori.  I am going to increase your pantoprazole to 40 mg twice daily and have sent this to your pharmacy.  Await pathology results, office will contact you.  Follow-up with GI in 3 months.  I hope you have a great rest of your week!  Elon Alas. Abbey Chatters, D.O. Gastroenterology and Hepatology [facility]

## 2023-01-18 NOTE — Transfer of Care (Signed)
Immediate Anesthesia Transfer of Care Note  Patient: David Knox  Procedure(s) Performed: ESOPHAGOGASTRODUODENOSCOPY (EGD) WITH PROPOFOL BIOPSY  Patient Location: Endoscopy Unit  Anesthesia Type:General  Level of Consciousness: awake, alert , and oriented  Airway & Oxygen Therapy: Patient Spontanous Breathing  Post-op Assessment: Report given to RN and Post -op Vital signs reviewed and stable  Post vital signs: Reviewed and stable  Last Vitals:  Vitals Value Taken Time  BP    Temp    Pulse    Resp    SpO2      Last Pain:  Vitals:   01/18/23 1331  TempSrc:   PainSc: 0-No pain      Patients Stated Pain Goal: 7 ([id number])  Complications: No notable events documented.

## 2023-01-18 NOTE — Op Note (Signed)
[facility] Patient Name: David Knox Procedure Date: 01/18/2023 1:23 PM MRN: [id number] Date of Birth: 12-31-1965 Attending MD: Elon Alas. Abbey Chatters , [place], [id number] CSN: [id number] Age: 57 Admit Type: Outpatient Procedure:                Upper GI endoscopy Indications:              Coffee-ground emesis, Melena Providers:                Elon Alas. Abbey Chatters, DO, Tammy Vaught, RN, El [place]umbao                            Page Referring MD:              Medicines:                See the Anesthesia note for documentation of the                            administered medications Complications:            No immediate complications. Estimated Blood Loss:     Estimated blood loss was minimal. Procedure:                Pre-Anesthesia Assessment:                           - The anesthesia plan was to use monitored                            anesthesia care (MAC).                           After obtaining informed consent, the endoscope was                            passed under direct vision. Throughout the                            procedure, the patient's blood pressure, pulse, and                            oxygen saturations were monitored continuously. The                            [id number]) scope was introduced through the                            mouth, and advanced to the second part of duodenum.                            The upper GI endoscopy was accomplished without                            difficulty. The patient tolerated the procedure                            well. Scope In: 1:34:23 PM  Scope Out: 1:40:53 PM Total Procedure Duration: 0 hours 6 minutes 30 seconds  Findings:      A 3 cm hiatal hernia was present.      LA Grade A (one or more mucosal breaks less than 5 mm, not extending       between tops of 2 mucosal folds) esophagitis with no bleeding was found       in the distal esophagus. FIndings consistent with short segment       Barrett's as well.  Biopsies were taken with a cold forceps for histology.      Patchy mild inflammation characterized by erythema was found in the       entire examined stomach. Biopsies were taken with a cold forceps for       Helicobacter pylori testing.      Localized moderate inflammation characterized by congestion (edema) and       erythema was found in the duodenal bulb and in the first portion of the       duodenum. Biopsies were taken with a cold forceps for histology. Impression:               - 3 cm hiatal hernia.                           - LA Grade A reflux esophagitis with no bleeding.                            Biopsied.                           - Gastritis. Biopsied.                           - Duodenitis. Biopsied. Moderate Sedation:      Per Anesthesia Care Recommendation:           - Patient has a contact number available for                            emergencies. The signs and symptoms of potential                            delayed complications were discussed with the                            patient. Return to normal activities tomorrow.                            Written discharge instructions were provided to the                            patient.                           - Resume previous diet.                           - Continue present medications.                           -  Await pathology results.                           - Use Protonix (pantoprazole) 40 mg PO BID.                           - Return to GI office in 3 months. Procedure Code(s):        --- Professional ---                           [phone], Esophagogastroduodenoscopy, flexible,                            transoral; with biopsy, single or multiple Diagnosis Code(s):        --- Professional ---                           K44.9, Diaphragmatic hernia without obstruction or                            gangrene                           K21.00, Gastro-esophageal reflux disease with                             esophagitis, without bleeding                           K29.70, Gastritis, unspecified, without bleeding                           K29.80, Duodenitis without bleeding                           K92.0, Hematemesis                           K92.1, Melena (includes Hematochezia) CPT copyright 2022 American Medical Association. All rights reserved. The codes documented in this report are preliminary and upon coder review may  be revised to meet current compliance requirements. Elon Alas. Abbey Chatters, DO [place] Abbey Chatters, DO 01/18/2023 1:49:02 PM This report has been signed electronically. Number of Addenda: 0

## 2023-01-18 NOTE — Anesthesia Preprocedure Evaluation (Signed)
Anesthesia Evaluation  Patient identified by MRN, date of birth, ID band Patient awake    Reviewed: Allergy & Precautions, H&P , NPO status , Patient's Chart, lab work & pertinent test results, reviewed documented beta blocker date and time   Airway Mallampati: II  TM Distance: >3 FB Neck ROM: full    Dental no notable dental hx.    Pulmonary neg pulmonary ROS, COPD, Current Smoker   Pulmonary exam normal breath sounds clear to auscultation       Cardiovascular Exercise Tolerance: Good hypertension, + angina  negative cardio ROS  Rhythm:regular Rate:Normal     Neuro/Psych  Neuromuscular disease negative neurological ROS  negative psych ROS   GI/Hepatic negative GI ROS, Neg liver ROS,GERD  ,,  Endo/Other  negative endocrine ROSdiabetes, Type 2 Hyperthyroidism   Renal/GU negative Renal ROS  negative genitourinary   Musculoskeletal   Abdominal   Peds  Hematology negative hematology ROS (+) Blood dyscrasia, anemia   Anesthesia Other Findings   Reproductive/Obstetrics negative OB ROS                             Anesthesia Physical Anesthesia Plan  ASA: 2  Anesthesia Plan: General   Post-op Pain Management:    Induction:   PONV Risk Score and Plan: Propofol infusion  Airway Management Planned:   Additional Equipment:   Intra-op Plan:   Post-operative Plan:   Informed Consent: I have reviewed the patients History and Physical, chart, labs and discussed the procedure including the risks, benefits and alternatives for the proposed anesthesia with the patient or authorized representative who has indicated his/her understanding and acceptance.     Dental Advisory Given  Plan Discussed with: CRNA  Anesthesia Plan Comments:        Anesthesia Quick Evaluation

## 2023-01-18 NOTE — Interval H&P Note (Signed)
History and Physical Interval Note:  01/18/2023 1:27 PM  David Knox  has presented today for surgery, with the diagnosis of melena,coffee ground emesis,GERD.  The various methods of treatment have been discussed with the patient and family. After consideration of risks, benefits and other options for treatment, the patient has consented to  Procedure(s) with comments: ESOPHAGOGASTRODUODENOSCOPY (EGD) WITH PROPOFOL (N/A) - 1:45 pm, asa 2 as a surgical intervention.  The patient's history has been reviewed, patient examined, no change in status, stable for surgery.  I have reviewed the patient's chart and labs.  Questions were answered to the patient's satisfaction.     [name]

## 2023-01-19 LAB — SURGICAL PATHOLOGY

## 2023-01-20 NOTE — Anesthesia Postprocedure Evaluation (Signed)
Anesthesia Post Note  Patient: David Knox  Procedure(s) Performed: ESOPHAGOGASTRODUODENOSCOPY (EGD) WITH PROPOFOL BIOPSY  Patient location during evaluation: Phase II Anesthesia Type: General Level of consciousness: awake Pain management: pain level controlled Vital Signs Assessment: post-procedure vital signs reviewed and stable Respiratory status: spontaneous breathing and respiratory function stable Cardiovascular status: blood pressure returned to baseline and stable Postop Assessment: no headache and no apparent nausea or vomiting Anesthetic complications: no Comments: Late entry   No notable events documented.   Last Vitals:  Vitals:   01/18/23 1238 01/18/23 1344  BP: 131/89 (!) 125/93  Pulse: 65 74  Resp: (!) 22 19  Temp: 37 C 36.5 C  SpO2: 97% 95%    Last Pain:  Vitals:   01/18/23 1344  TempSrc: Oral  PainSc:                  Louann Sjogren

## 2023-01-25 ENCOUNTER — Encounter: Payer: Self-pay | Admitting: Internal Medicine

## 2023-01-25 ENCOUNTER — Encounter: Payer: Self-pay | Admitting: *Deleted

## 2023-01-25 ENCOUNTER — Ambulatory Visit: Payer: Medicaid Other | Attending: Internal Medicine | Admitting: Internal Medicine

## 2023-01-25 VITALS — BP 134/80 | HR 78 | Ht 71.0 in | Wt 194.2 lb

## 2023-01-25 DIAGNOSIS — R079 Chest pain, unspecified: Secondary | ICD-10-CM

## 2023-01-25 DIAGNOSIS — I739 Peripheral vascular disease, unspecified: Secondary | ICD-10-CM | POA: Insufficient documentation

## 2023-01-25 DIAGNOSIS — R29898 Other symptoms and signs involving the musculoskeletal system: Secondary | ICD-10-CM

## 2023-01-25 MED ORDER — METOPROLOL TARTRATE 25 MG PO TABS: 25.0000 mg | ORAL_TABLET | Freq: Two times a day (BID) | ORAL | 3 refills | Status: DC

## 2023-01-25 NOTE — Progress Notes (Signed)
Cardiology Office Note  Date: 01/25/2023   ID: David Knox, DOB Dec 28, 1965, MRN [id number]  PCP:  Lindell Spar, MD  Cardiologist:  None Electrophysiologist:  None   Reason for Office Visit: Evaluation of chest tightness at the request of Dr. Posey Pronto   History of Present Illness: David Knox is a 57 y.o. male known to have HTN, DM 2, HLD, nicotine abuse was referred to cardiology clinic for evaluation of chest tightness.  Patient was having substernal chest tightness radiating to the left arm x 1 year, lasting for few minutes, occurs with exertion (walking uphill to the mailbox, walking from the mall to the [place]) and resolves with rest. Frequency once per week. Associated with SOB.  Otherwise, denies DOE, dizziness, lightheadedness, syncope, palpitations, leg swelling.  Smokes half a pack a day (was previously smoking 1 and half pack per day which she had to cut it down for his hip surgery in July 2023). He also has tightness and fatigue in his legs when he walks.  Past Medical History:  Diagnosis Date   Acid reflux    Diabetes mellitus without complication ([place])    HLD (hyperlipidemia)    HTN (hypertension)     Past Surgical History:  Procedure Laterality Date   COLONOSCOPY WITH PROPOFOL N/A 07/27/2022   Procedure: COLONOSCOPY WITH PROPOFOL;  Surgeon: [name], DO;  [facility];  Service: Endoscopy;  Laterality: N/A;  9:00 am  ASA 2   ESOPHAGOGASTRODUODENOSCOPY  2002   RMR: ulcerative reflux esophagitis, moderate sized hiatal hernia   HIP SURGERY Bilateral    as a child   POLYPECTOMY  07/27/2022   Procedure: POLYPECTOMY;  Surgeon: [name], DO;  [facility];  Service: Endoscopy;;   TOOTH EXTRACTION     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   Procedure: TOTAL HIP ARTHROPLASTY;  Surgeon: Carole Civil, MD;  Location: [facility];  Service: Orthopedics;  Laterality: Right;   WRIST SURGERY      Current Outpatient  Medications  Medication Sig Dispense Refill   acetaminophen (TYLENOL) 500 MG tablet Take 2 tablets (1,000 mg total) by mouth every 6 (six) hours. 30 tablet 0   albuterol (VENTOLIN HFA) 108 (90 Base) MCG/ACT inhaler Inhale 2 puffs into the lungs every 6 (six) hours as needed for wheezing or shortness of breath. 8 g 3   amLODipine (NORVASC) 5 MG tablet Take 1 tablet (5 mg total) by mouth daily. 90 tablet 1   atorvastatin (LIPITOR) 20 MG tablet TAKE 1 TABLET BY MOUTH EVERY DAY 90 tablet 1   blood glucose meter kit and supplies Dispense based on patient and insurance preference. Use up to four times daily as directed. (FOR ICD-10 E10.9, E11.9). 1 each 0   gabapentin (NEURONTIN) 300 MG capsule TAKE 1 CAPSULE BY MOUTH TWICE A DAY 60 capsule 5   latanoprost (XALATAN) 0.005 % ophthalmic solution Place 1 drop into both eyes at bedtime.     metFORMIN (GLUCOPHAGE) 1000 MG tablet Take 1 tablet (1,000 mg total) by mouth 2 (two) times daily with a meal. 180 tablet 1   naproxen (NAPROSYN) 500 MG tablet TAKE 1 TABLET BY MOUTH TWICE A DAY WITH FOOD 60 tablet 2   nitroGLYCERIN (NITROSTAT) 0.4 MG SL tablet Place 1 tablet (0.4 mg total) under the tongue every 5 (five) minutes as needed for chest pain. 10 tablet 1   pantoprazole (PROTONIX) 40 MG tablet Take 1 tablet (40 mg total) by mouth 2 (  two) times daily. 60 tablet 11   telmisartan (MICARDIS) 40 MG tablet TAKE 1 TABLET BY MOUTH EVERY DAY 90 tablet 1   No current facility-administered medications for this visit.   Allergies:  Aspirin   Social History: The patient  reports that he has been smoking cigarettes. He has been smoking an average of .5 packs per day. He has never used smokeless tobacco. He reports current alcohol use. He reports that he does not use drugs.   Family History: The patient's family history includes Diabetes in his father and mother; Heart disease in his father and mother.   ROS:  Please see the history of present illness. Otherwise,  complete review of systems is positive for none.  All other systems are reviewed and negative.   Physical Exam: VS:  BP 134/80   Pulse 78   Ht 5\' 11"  (1.803 m)   Wt 194 lb 3.2 oz (88.1 kg)   SpO2 97%   BMI 27.09 kg/m , BMI Body mass index is 27.09 kg/m.  Wt Readings from Last 3 Encounters:  01/25/23 194 lb 3.2 oz (88.1 kg)  01/18/23 201 lb (91.2 kg)  01/10/23 199 lb 9.6 oz (90.5 kg)    General: Patient appears comfortable at rest. HEENT: Conjunctiva and lids normal, oropharynx clear with moist mucosa. Neck: Supple, no elevated JVP or carotid bruits, no thyromegaly. Lungs: Clear to auscultation, nonlabored breathing at rest. Cardiac: Regular rate and rhythm, no S3 or significant systolic murmur, no pericardial rub. Abdomen: Soft, nontender, no hepatomegaly, bowel sounds present, no guarding or rebound. Extremities: No pitting edema, distal pulses 2+. Skin: Warm and dry. Musculoskeletal: No kyphosis. Neuropsychiatric: Alert and oriented x3, affect grossly appropriate.  ECG: NSR  Recent Labwork: 04/18/2022: TSH 0.821 05/18/2022: Magnesium 1.7 08/21/2022: Hemoglobin 13.5; Platelets 372 11/30/2022: ALT 17; AST 16; BUN 10; Creatinine, Ser 1.13; Potassium 4.1; Sodium 141     Component Value Date/Time   CHOL 160 08/21/2022 0948   TRIG 717 (HH) 08/21/2022 0948   HDL 35 (L) 08/21/2022 0948   CHOLHDL 4.6 08/21/2022 0948   LDLCALC 30 08/21/2022 0948    Assessment and Plan: Patient is a 57 year old M known to have HTN, DM 2, HLD, nicotine abuse was referred to cardiology clinic for evaluation of chest tightness.  # Cardiac chest pain -Patient was having substernal chest tightness radiating to the left arm x 1 year, lasting for few minutes, occurs with exertion (walking uphill to the mailbox, walking [place] the mall [place]) and resolves with rest. Frequency once per week. Associated with SOB.  -Obtain exercise Myoview -Obtain 2D echocardiogram -Start metoprolol tartrate 25 mg  twice daily and continue amlodipine 5 mg once daily -SL NTG 0.4 mg as needed -ER precautions for chest pain  # Bilateral lower extremity claudication (leg fatigue/tightness while walking), rule out PAD -Obtain ABI with ultrasound arterial Doppler lower extremities  # HTN, controlled -Continue amlodipine 5 mg once daily and telmisartan 40 mg once daily.  HTN management per PCP.  # HLD, at goal -Continue atorvastatin 20 mg nightly.  Goal LDL less than 100.  # Nicotine abuse - Smoking cessation instruction/counseling given:  counseled patient on the dangers of tobacco use, advised patient to stop smoking, and reviewed strategies to maximize success.  Currently smoking half a pack a day (previously was smoking 1/2 pack/day and cut it down mainly for his hip surgery in July 2023).  He is motivated to quit.  I have spent a total of 45 minutes  with patient reviewing chart, EKGs, labs and examining patient as well as establishing an assessment and plan that was discussed with the patient.  > 50% of time was spent in direct patient care.     Medication Adjustments/Labs and Tests Ordered: Current medicines are reviewed at length with the patient today.  Concerns regarding medicines are outlined above.   Tests Ordered: Orders Placed This Encounter  Procedures   NM Myocar Multi W/Spect W/Wall Motion / EF   US ARTERIAL ABI (SCREENING LOWER EXTREMITY)   ECHOCARDIOGRAM COMPLETE     Medication Changes: Meds ordered this encounter  Medications   metoprolol tartrate (LOPRESSOR) 25 MG tablet    Sig: Take 1 tablet (25 mg total) by mouth 2 (two) times daily.    Dispense:  180 tablet    Refill:  3      Disposition:  Follow up  3 months or sooner depending on results  Signed, Elonzo Sopp Fidel Levy, MD, 01/25/2023 10:06 AM    [place] [address], [place], [place] [address]

## 2023-01-25 NOTE — Patient Instructions (Signed)
Medication Instructions:  Your physician has recommended you make the following change in your medication:   Start Lopressor ( Metoprolol Tartrate) 25 mg Two Times Daily   *If you need a refill on your cardiac medications before your next appointment, please call your pharmacy*   Lab Work: NONE  If you have labs (blood work) drawn today and your tests are completely normal, you will receive your results only by: [place] (if you have MyChart) OR A paper copy in the mail If you have any lab test that is abnormal or we need to change your treatment, we will call you to review the results.   Testing/Procedures: Your physician has requested that you have an echocardiogram. Echocardiography is a painless test that uses sound waves to create images of your heart. It provides your doctor with information about the size and shape of your heart and how well your heart's chambers and valves are working. This procedure takes approximately one hour. There are no restrictions for this procedure. Please do NOT wear cologne, perfume, aftershave, or lotions (deodorant is allowed). Please arrive 15 minutes prior to your appointment time.  Your physician has requested that you have en exercise stress myoview. For further information please visit [email or web address]. Please follow instruction sheet, as given.   Your physician has requested that you have a lower or upper extremity arterial duplex. This test is an ultrasound of the arteries in the legs or arms. It looks at arterial blood flow in the legs and arms. Allow one hour for Lower and Upper Arterial scans. There are no restrictions or special instructions    Follow-Up: At [facility], you and your health needs are our priority.  As part of our continuing mission to provide you with exceptional heart care, we have created designated Provider Care Teams.  These Care Teams include your primary Cardiologist (physician) and Advanced  Practice Providers (APPs -  Physician Assistants and Nurse Practitioners) who all work together to provide you with the care you need, when you need it.  We recommend signing up for the patient portal called "MyChart".  Sign up information is provided on this After Visit Summary.  MyChart is used to connect with patients for Virtual Visits (Telemedicine).  Patients are able to view lab/test results, encounter notes, upcoming appointments, etc.  Non-urgent messages can be sent to your provider as well.   To learn more about what you can do with MyChart, go to [email or web address].    Your next appointment:   3 month(s)  Provider:   Claudina Lick, MD    Other Instructions Thank you for choosing [facility]!

## 2023-01-29 ENCOUNTER — Encounter (HOSPITAL_COMMUNITY): Payer: Self-pay | Admitting: Internal Medicine

## 2023-02-01 ENCOUNTER — Other Ambulatory Visit: Payer: Self-pay | Admitting: Internal Medicine

## 2023-02-01 DIAGNOSIS — I10 Essential (primary) hypertension: Secondary | ICD-10-CM

## 2023-02-02 ENCOUNTER — Ambulatory Visit (HOSPITAL_COMMUNITY): Admission: RE | Admit: 2023-02-02 | Payer: Medicaid Other | Source: Ambulatory Visit

## 2023-02-02 ENCOUNTER — Encounter (HOSPITAL_COMMUNITY): Admission: RE | Admit: 2023-02-02 | Payer: Medicaid Other | Source: Ambulatory Visit

## 2023-02-08 ENCOUNTER — Ambulatory Visit (HOSPITAL_COMMUNITY)
Admission: RE | Admit: 2023-02-08 | Discharge: 2023-02-08 | Disposition: A | Discharge status: Home / Self Care | Payer: Medicaid Other | Source: Ambulatory Visit | Attending: Internal Medicine | Admitting: Internal Medicine

## 2023-02-08 ENCOUNTER — Encounter (HOSPITAL_COMMUNITY)
Admission: RE | Admit: 2023-02-08 | Discharge: 2023-02-08 | Disposition: A | Discharge status: Home / Self Care | Payer: Medicaid Other | Source: Ambulatory Visit | Attending: Internal Medicine | Admitting: Internal Medicine

## 2023-02-08 ENCOUNTER — Encounter (HOSPITAL_COMMUNITY): Payer: Self-pay

## 2023-02-08 DIAGNOSIS — R079 Chest pain, unspecified: Secondary | ICD-10-CM | POA: Insufficient documentation

## 2023-02-08 DIAGNOSIS — R001 Bradycardia, unspecified: Secondary | ICD-10-CM | POA: Diagnosis not present

## 2023-02-08 DIAGNOSIS — R0600 Dyspnea, unspecified: Secondary | ICD-10-CM | POA: Diagnosis not present

## 2023-02-08 LAB — NM MYOCAR MULTI W/SPECT W/WALL MOTION / EF
Angina Index: 1
Base ST Depression (mm): 0 mm
Duke Treadmill Score: 1
Estimated workload: 7
Exercise duration (min): 5 min
Exercise duration (sec): 13 s
LV dias vol: 104 mL (ref 62–150)
LV sys vol: 47 mL
MPHR: 166 {beats}/min
Nuc Stress EF: 55 %
Peak HR: 142 {beats}/min
Percent HR: 85 %
RATE: 0.3
RPE: 14
Rest HR: 59 {beats}/min
Rest Nuclear Isotope Dose: 11 mCi
SDS: 0
SRS: 0
SSS: 0
ST Depression (mm): 0 mm
Stress Nuclear Isotope Dose: 33 mCi
TID: 1.11

## 2023-02-08 MED ORDER — SODIUM CHLORIDE FLUSH 0.9 % IV SOLN
INTRAVENOUS | Status: AC
  Filled 2023-02-08: qty 10

## 2023-02-08 MED ORDER — TECHNETIUM TC 99M TETROFOSMIN IV KIT
30.0000 | PACK | Freq: Once | INTRAVENOUS | Status: AC | PRN
  Administered 2023-02-08: 33 via INTRAVENOUS

## 2023-02-08 MED ORDER — REGADENOSON 0.4 MG/5ML IV SOLN
INTRAVENOUS | Status: AC
  Filled 2023-02-08: qty 5

## 2023-02-08 MED ORDER — TECHNETIUM TC 99M TETROFOSMIN IV KIT
10.0000 | PACK | Freq: Once | INTRAVENOUS | Status: AC | PRN
  Administered 2023-02-08: 11 via INTRAVENOUS

## 2023-02-19 ENCOUNTER — Ambulatory Visit (HOSPITAL_COMMUNITY)
Admission: RE | Admit: 2023-02-19 | Discharge: 2023-02-19 | Disposition: A | Discharge status: Home / Self Care | Payer: Medicaid Other | Source: Ambulatory Visit | Attending: Internal Medicine | Admitting: Internal Medicine

## 2023-02-19 DIAGNOSIS — R079 Chest pain, unspecified: Secondary | ICD-10-CM

## 2023-02-19 DIAGNOSIS — R29898 Other symptoms and signs involving the musculoskeletal system: Secondary | ICD-10-CM

## 2023-02-19 LAB — ECHOCARDIOGRAM COMPLETE
Area-P 1/2: 3.45 cm2
S' Lateral: 2.5 cm

## 2023-02-19 NOTE — Progress Notes (Signed)
*  PRELIMINARY RESULTS* Echocardiogram 2D Echocardiogram has been performed.  Stacey Drain 02/19/2023, 2:47 PM

## 2023-02-22 ENCOUNTER — Telehealth: Payer: Self-pay

## 2023-02-22 MED ORDER — ASPIRIN 81 MG PO TBEC: 81.0000 mg | DELAYED_RELEASE_TABLET | Freq: Every day | ORAL | 3 refills | Status: DC

## 2023-02-22 NOTE — Telephone Encounter (Signed)
-----   Message from Marjo Bicker, MD sent at 02/22/2023 10:02 AM EDT ----- There is mild abnormality in the flow in one of the vessels supplying the right leg (below the knee). Otherwise, no evidence of poor circulation in the lower extremities.  Start aspirin 81 mg once daily and continue atorvastatin 20 mg nightly. Goal LDL should be less than 70 (30, 6 months ago).

## 2023-02-22 NOTE — Telephone Encounter (Signed)
Results discussed with patient, he will begin ASA 81 mg daily

## 2023-03-12 ENCOUNTER — Other Ambulatory Visit (INDEPENDENT_AMBULATORY_CARE_PROVIDER_SITE_OTHER): Payer: Medicaid Other

## 2023-03-12 ENCOUNTER — Ambulatory Visit (INDEPENDENT_AMBULATORY_CARE_PROVIDER_SITE_OTHER): Payer: Medicaid Other | Admitting: Orthopedic Surgery

## 2023-03-12 VITALS — BP 135/87 | HR 60

## 2023-03-12 DIAGNOSIS — M1611 Unilateral primary osteoarthritis, right hip: Secondary | ICD-10-CM

## 2023-03-12 DIAGNOSIS — Z96641 Presence of right artificial hip joint: Secondary | ICD-10-CM

## 2023-03-12 DIAGNOSIS — M1612 Unilateral primary osteoarthritis, left hip: Secondary | ICD-10-CM

## 2023-03-12 NOTE — Progress Notes (Signed)
Chief Complaint  Patient presents with   Hip Pain    Right hip DOS 05/16/22 10 mo fu     Patient ID: David Knox, male   DOB: 12/27/1965, 57 y.o.   MRN: [id number]  Chief Complaint  Patient presents with   Hip Pain    Right hip DOS 05/16/22 10 mo fu     HPI David Knox is a 57 y.o. male.  This will serve as the annual follow-up Status post  total hip arthroplasty. The patient is doing well the hip implant is functioning well.  He also has arthritis in his left hip but no symptoms as of date  Review of Systems Review of Systems  Constitutional symptoms no fever or chills Neurologic symptoms no numbness or tingling  Past Medical History:  Diagnosis Date   Acid reflux    Diabetes mellitus without complication ([facility])    HLD (hyperlipidemia)    HTN (hypertension)     Past Surgical History:  Procedure Laterality Date   BIOPSY  01/18/2023   Procedure: BIOPSY;  Surgeon: Lanelle Bal, DO;  [facility];  Service: Endoscopy;;   COLONOSCOPY WITH PROPOFOL N/A 07/27/2022   Procedure: COLONOSCOPY WITH PROPOFOL;  Surgeon: Lanelle Bal, DO;  [facility];  Service: Endoscopy;  Laterality: N/A;  9:00 am  ASA 2   ESOPHAGOGASTRODUODENOSCOPY  2002   RMR: ulcerative reflux esophagitis, moderate sized hiatal hernia   ESOPHAGOGASTRODUODENOSCOPY (EGD) WITH PROPOFOL N/A 01/18/2023   Procedure: ESOPHAGOGASTRODUODENOSCOPY (EGD) WITH PROPOFOL;  Surgeon: Lanelle Bal, DO;  [facility];  Service: Endoscopy;  Laterality: N/A;  1:45 pm, asa 2   HIP SURGERY Bilateral    as a child   POLYPECTOMY  07/27/2022   Procedure: POLYPECTOMY;  Surgeon: Lanelle Bal, DO;  [facility];  Service: Endoscopy;;   TOOTH EXTRACTION     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   Procedure: TOTAL HIP ARTHROPLASTY;  Surgeon: Vickki Hearing, MD;  [facility]: [facility];  Service: Orthopedics;  Laterality: Right;   WRIST SURGERY       Allergies   Allergen Reactions   Aspirin Nausea Only    Current Outpatient Medications  Medication Sig Dispense Refill   acetaminophen (TYLENOL) 500 MG tablet Take 2 tablets (1,000 mg total) by mouth every 6 (six) hours. 30 tablet 0   albuterol (VENTOLIN HFA) 108 (90 Base) MCG/ACT inhaler Inhale 2 puffs into the lungs every 6 (six) hours as needed for wheezing or shortness of breath. 8 g 3   amLODipine (NORVASC) 5 MG tablet TAKE 1 TABLET (5 MG TOTAL) BY MOUTH DAILY. 90 tablet 1   aspirin EC 81 MG tablet Take 1 tablet (81 mg total) by mouth daily. Swallow whole. 90 tablet 3   atorvastatin (LIPITOR) 20 MG tablet TAKE 1 TABLET BY MOUTH EVERY DAY 90 tablet 1   blood glucose meter kit and supplies Dispense based on patient and insurance preference. Use up to four times daily as directed. (FOR ICD-10 E10.9, E11.9). 1 each 0   gabapentin (NEURONTIN) 300 MG capsule TAKE 1 CAPSULE BY MOUTH TWICE A DAY 60 capsule 5   latanoprost (XALATAN) 0.005 % ophthalmic solution Place 1 drop into both eyes at bedtime.     metFORMIN (GLUCOPHAGE) 1000 MG tablet Take 1 tablet (1,000 mg total) by mouth 2 (two) times daily with a meal. 180 tablet 1   metoprolol tartrate (LOPRESSOR) 25 MG tablet Take 1 tablet (25 mg total)  by mouth 2 (two) times daily. 180 tablet 3   naproxen (NAPROSYN) 500 MG tablet TAKE 1 TABLET BY MOUTH TWICE A DAY WITH FOOD 60 tablet 2   nitroGLYCERIN (NITROSTAT) 0.4 MG SL tablet Place 1 tablet (0.4 mg total) under the tongue every 5 (five) minutes as needed for chest pain. 10 tablet 1   pantoprazole (PROTONIX) 40 MG tablet Take 1 tablet (40 mg total) by mouth 2 (two) times daily. 60 tablet 11   telmisartan (MICARDIS) 40 MG tablet TAKE 1 TABLET BY MOUTH EVERY DAY 90 tablet 1   No current facility-administered medications for this visit.     Physical Exam Blood pressure 135/87, pulse 60.  Overall appearance normal grooming and hygiene normal. The patient is awake alert and oriented 3 with a pleasant mood  and affect. The patient is ambulatory without assistive device and without limping.  Right HIP: The skin incision is healed without any erythema or tenderness or neuroma. Hip flexion strength grade 5. Hip is stable to post pull and abduction stress test. Hip flexion is 130. There is no tenderness or swelling around the hip. Distal pulses are intact sensation is normal and there is no lymphadenopathy in the groin patient walks with normal balance and coordination  Left hip:  Flexion 130 no pain he does have some loss of internal rotation  Leg lengths equal  Data Reviewed X-rays were ordered today, separate report was dictated.  My interpretation of the x-ray today start: History today's imaging shows stable left total hip implant without loosening or complication, there is a bone spur on the lateral femur just distal to the greater trochanter  Left hip pistol-grip deformity moderate OA  Assessment Status post right total hip stable  OA left hip no symptoms  Plan Routine repeat x-ray right hip in one year  9:59 AM Fuller Canada, MD 03/12/2023

## 2023-03-28 ENCOUNTER — Other Ambulatory Visit: Payer: Self-pay | Admitting: Internal Medicine

## 2023-03-28 DIAGNOSIS — E782 Mixed hyperlipidemia: Secondary | ICD-10-CM

## 2023-03-29 ENCOUNTER — Encounter: Payer: Self-pay | Admitting: Gastroenterology

## 2023-04-08 NOTE — Progress Notes (Deleted)
GI Office Note    Referring Provider: Anabel Halon, MD Primary Care Physician:  Anabel Halon, MD  Primary Gastroenterologist: Hennie Duos. Marletta Lor, DO   Chief Complaint   No chief complaint on file.   History of Present Illness   David Knox is a 57 y.o. male presenting today     Avoids dairy.  Advised imodium prn Advised stopping metformin ?fecal elastase.  Gerd, pantoprazole.  IgA elevated, however TTG IgA negative.  TSH normal***  EGD 01/2023: -3cm hh -LA Grade A reflux esophagitis -endoscopic concern for Barrett's, biopsy c/w mild reflux changes -gastritis, reactive gastropathy, neg for H.pylori -duodenitis, benign small bowel mucosa with foveolar metaplasia, s/o peptic injury   Colonoscopy 07/27/2022: -Transverse and descending diverticulosis -4 mm polyp in descending colon -Diminutive hyperplastic polyp, negative for dysplasia -Repeat colonoscopy in 10 years   Medications   Current Outpatient Medications  Medication Sig Dispense Refill   acetaminophen (TYLENOL) 500 MG tablet Take 2 tablets (1,000 mg total) by mouth every 6 (six) hours. 30 tablet 0   albuterol (VENTOLIN HFA) 108 (90 Base) MCG/ACT inhaler Inhale 2 puffs into the lungs every 6 (six) hours as needed for wheezing or shortness of breath. 8 g 3   amLODipine (NORVASC) 5 MG tablet TAKE 1 TABLET (5 MG TOTAL) BY MOUTH DAILY. 90 tablet 1   aspirin EC 81 MG tablet Take 1 tablet (81 mg total) by mouth daily. Swallow whole. 90 tablet 3   atorvastatin (LIPITOR) 20 MG tablet TAKE 1 TABLET BY MOUTH EVERY DAY 90 tablet 1   blood glucose meter kit and supplies Dispense based on patient and insurance preference. Use up to four times daily as directed. (FOR ICD-10 E10.9, E11.9). 1 each 0   gabapentin (NEURONTIN) 300 MG capsule TAKE 1 CAPSULE BY MOUTH TWICE A DAY 60 capsule 5   latanoprost (XALATAN) 0.005 % ophthalmic solution Place 1 drop into both eyes at bedtime.     metFORMIN (GLUCOPHAGE) 1000  MG tablet Take 1 tablet (1,000 mg total) by mouth 2 (two) times daily with a meal. 180 tablet 1   metoprolol tartrate (LOPRESSOR) 25 MG tablet Take 1 tablet (25 mg total) by mouth 2 (two) times daily. 180 tablet 3   naproxen (NAPROSYN) 500 MG tablet TAKE 1 TABLET BY MOUTH TWICE A DAY WITH FOOD 60 tablet 2   nitroGLYCERIN (NITROSTAT) 0.4 MG SL tablet Place 1 tablet (0.4 mg total) under the tongue every 5 (five) minutes as needed for chest pain. 10 tablet 1   pantoprazole (PROTONIX) 40 MG tablet Take 1 tablet (40 mg total) by mouth 2 (two) times daily. 60 tablet 11   telmisartan (MICARDIS) 40 MG tablet TAKE 1 TABLET BY MOUTH EVERY DAY 90 tablet 1   No current facility-administered medications for this visit.    Allergies   Allergies as of 04/09/2023 - Review Complete 03/12/2023  Allergen Reaction Noted   Aspirin Nausea Only 05/18/2022     Past Medical History   Past Medical History:  Diagnosis Date   Acid reflux    Diabetes mellitus without complication ([facility])    HLD (hyperlipidemia)    HTN (hypertension)     Past Surgical History   Past Surgical History:  Procedure Laterality Date   BIOPSY  01/18/2023   Procedure: BIOPSY;  Surgeon: Lanelle Bal, DO;  [facility];  Service: Endoscopy;;   COLONOSCOPY WITH PROPOFOL N/A 07/27/2022   Procedure: COLONOSCOPY WITH PROPOFOL;  Surgeon: Earnest Bailey  K, DO;  [facility];  Service: Endoscopy;  Laterality: N/A;  9:00 am  ASA 2   ESOPHAGOGASTRODUODENOSCOPY  2002   RMR: ulcerative reflux esophagitis, moderate sized hiatal hernia   ESOPHAGOGASTRODUODENOSCOPY (EGD) WITH PROPOFOL N/A 01/18/2023   Procedure: ESOPHAGOGASTRODUODENOSCOPY (EGD) WITH PROPOFOL;  Surgeon: Lanelle Bal, DO;  [facility];  Service: Endoscopy;  Laterality: N/A;  1:45 pm, asa 2   HIP SURGERY Bilateral    as a child   POLYPECTOMY  07/27/2022   Procedure: POLYPECTOMY;  Surgeon: Lanelle Bal, DO;  [facility];   Service: Endoscopy;;   TOOTH EXTRACTION     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   Procedure: TOTAL HIP ARTHROPLASTY;  Surgeon: Vickki Hearing, MD;  [facility]: [facility];  Service: Orthopedics;  Laterality: Right;   WRIST SURGERY      Past Family History   Family History  Problem Relation Age of Onset   Diabetes Mother    Heart disease Mother    Diabetes Father    Heart disease Father    Colon cancer Neg Hx     Past Social History   Social History   Socioeconomic History   Marital status: Single    Spouse name: Not on file   Number of children: Not on file   Years of education: Not on file   Highest education level: Not on file  Occupational History   Not on file  Tobacco Use   Smoking status: Every Day    Packs/day: .5    Types: Cigarettes   Smokeless tobacco: Never  Vaping Use   Vaping Use: Never used  Substance and Sexual Activity   Alcohol use: Yes    Comment: beer and liquor twice weekly   Drug use: No    Comment: "crack" former- last about 3 years ago.   Sexual activity: Not on file  Other Topics Concern   Not on file  Social History Narrative   Not on file   Social Determinants of Health   Financial Resource Strain: Not on file  Food Insecurity: Not on file  Transportation Needs: Not on file  Physical Activity: Not on file  Stress: Not on file  Social Connections: Not on file  Intimate Partner Violence: Not on file    Review of Systems   General: Negative for anorexia, weight loss, fever, chills, fatigue, weakness. ENT: Negative for hoarseness, difficulty swallowing , nasal congestion. CV: Negative for chest pain, angina, palpitations, dyspnea on exertion, peripheral edema.  Respiratory: Negative for dyspnea at rest, dyspnea on exertion, cough, sputum, wheezing.  GI: See history of present illness. GU:  Negative for dysuria, hematuria, urinary incontinence, urinary frequency, nocturnal urination.  Endo: Negative for unusual weight change.      Physical Exam   There were no vitals taken for this visit.   General: Well-nourished, well-developed in no acute distress.  Eyes: No icterus. Mouth: Oropharyngeal mucosa moist and pink , no lesions erythema or exudate. Lungs: Clear to auscultation bilaterally.  Heart: Regular rate and rhythm, no murmurs rubs or gallops.  Abdomen: Bowel sounds are normal, nontender, nondistended, no hepatosplenomegaly or masses,  no abdominal bruits or hernia , no rebound or guarding.  Rectal: ***  Extremities: No lower extremity edema. No clubbing or deformities. Neuro: Alert and oriented x 4   Skin: Warm and dry, no jaundice.   Psych: Alert and cooperative, normal mood and affect.  Labs   Lab Results  Component Value Date  TSH 0.821 04/18/2022   Lab Results  Component Value Date   HGBA1C 7.0 (H) 11/30/2022   Lab Results  Component Value Date   ALT 17 11/30/2022   AST 16 11/30/2022   ALKPHOS 72 11/30/2022   BILITOT <0.2 11/30/2022   Lab Results  Component Value Date   CREATININE 1.13 11/30/2022   BUN 10 11/30/2022   NA 141 11/30/2022   K 4.1 11/30/2022   CL 104 11/30/2022   CO2 22 11/30/2022   Lab Results  Component Value Date   WBC 9.6 08/21/2022   HGB 13.5 08/21/2022   HCT 41.8 08/21/2022   MCV 87 08/21/2022   PLT 372 08/21/2022    Imaging Studies   DG HIP UNILAT WITH PELVIS 2-3 VIEWS RIGHT  Result Date: 03/12/2023 1 year postop imaging right total hip direct lateral approach Press-fit stem and press-fit cup are noted Stem position looks good as stocks cup position.  There is no sign of loosening or complication The patient does have an osteophyte from her prior pinning of his hip at the lateral femur distal to the greater trochanter. He also has pistol-grip deformity of his left hip with osteoarthritic changes there as well.  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 Melvyn Neth, MHS, PA-C [facility]

## 2023-04-09 ENCOUNTER — Ambulatory Visit: Payer: 59 | Admitting: Internal Medicine

## 2023-04-09 ENCOUNTER — Encounter: Payer: Self-pay | Admitting: Gastroenterology

## 2023-04-09 ENCOUNTER — Ambulatory Visit: Payer: Medicaid Other | Admitting: Gastroenterology

## 2023-04-19 ENCOUNTER — Encounter: Payer: Medicaid Other | Admitting: Orthopedic Surgery

## 2023-04-23 ENCOUNTER — Other Ambulatory Visit: Payer: Self-pay | Admitting: Internal Medicine

## 2023-04-23 DIAGNOSIS — E1165 Type 2 diabetes mellitus with hyperglycemia: Secondary | ICD-10-CM

## 2023-04-25 ENCOUNTER — Ambulatory Visit: Payer: Medicaid Other | Admitting: Internal Medicine

## 2023-04-25 ENCOUNTER — Encounter: Payer: Self-pay | Admitting: Internal Medicine

## 2023-04-25 VITALS — BP 138/86 | HR 57 | Ht 71.0 in | Wt 193.0 lb

## 2023-04-25 DIAGNOSIS — Z125 Encounter for screening for malignant neoplasm of prostate: Secondary | ICD-10-CM | POA: Diagnosis not present

## 2023-04-25 DIAGNOSIS — E1165 Type 2 diabetes mellitus with hyperglycemia: Secondary | ICD-10-CM

## 2023-04-25 DIAGNOSIS — I10 Essential (primary) hypertension: Secondary | ICD-10-CM

## 2023-04-25 DIAGNOSIS — E1140 Type 2 diabetes mellitus with diabetic neuropathy, unspecified: Secondary | ICD-10-CM

## 2023-04-25 DIAGNOSIS — E782 Mixed hyperlipidemia: Secondary | ICD-10-CM

## 2023-04-25 LAB — POCT GLYCOSYLATED HEMOGLOBIN (HGB A1C): HbA1c, POC (controlled diabetic range): 6.4 % (ref 0.0–7.0)

## 2023-04-25 MED ORDER — METFORMIN HCL ER 500 MG PO TB24: 1000.0000 mg | ORAL_TABLET | Freq: Every day | ORAL | 1 refills | Status: DC

## 2023-04-25 NOTE — Assessment & Plan Note (Signed)
Check lipid profile On Atorvastatin 

## 2023-04-25 NOTE — Patient Instructions (Addendum)
Please start taking Metformin ER 2 tablets once daily instead of Metformin 1000 mg twice daily.  Please start taking Aspirin 81 mg once daily.  Please continue to take other medications as prescribed. Please continue to follow low carb diet and perform moderate exercise/walking at least 150 mins/week.  Please get fasting blood tests done before the next visit.

## 2023-04-25 NOTE — Assessment & Plan Note (Addendum)
Lab Results  Component Value Date   HGBA1C 6.4 04/25/2023   Well controlled Likely due to oral steroid use, has stopped oral prednisone now On metformin to 1000 mg twice daily, but has chronic loose BM with it Switched to metformin ER 1000 mg daily Advised to follow diabetic diet On statin and ARB F/u CMP and lipid panel Diabetic eye exam: Advised to follow up with Ophthalmology for diabetic eye exam  On gabapentin 300 mg twice daily for neuropathy If persistent, will refer to Neurology

## 2023-04-25 NOTE — Assessment & Plan Note (Addendum)
Ordered PSA after discussing its limitations for prostate cancer screening, including false positive results leading to additional investigations. 

## 2023-04-25 NOTE — Assessment & Plan Note (Signed)
BP Readings from Last 1 Encounters:  04/25/23 138/86   Well-controlled with Amlodipine 5 mg every day, Telmisartan 40  mg every day and metoprolol 25 mg twice daily Counseled for compliance with the medications Advised DASH diet and moderate exercise/walking, at least 150 mins/week

## 2023-04-25 NOTE — Progress Notes (Signed)
Established Patient Office Visit  Subjective:  Patient ID: David Knox, male    DOB: September 13, 1966  Age: 57 y.o. MRN: [id number]  CC:  Chief Complaint  Patient presents with   Hypertension   Diabetes    HPI David Knox is a 57 y.o. male with past medical history of HTN, type 2 DM, peripheral neuropathy, GERD, hip arthritis and tobacco abuse who presents for f/u of his chronic medical conditions.  Type II DM: He was recently diagnosed with type II DM, likely due to oral steroid use. His HbA1c is 7.0 now. He used to be in prediabetes range till 06/23, HbA1c 6.0, but it increased to 10.7 in 10/23. He was placed on metformin 500 mg daily.  He had a visit with Dr. Barbaraann Faster, and has been placed on metformin 1000 mg twice daily now. He still complains of excessive thirst and blurry vision. He has not seen Optometrist yet.  HTN: BP is well-controlled. Takes medications regularly.  He had cardiology evaluation for episodes of chest pain.  He was placed on metoprolol 25 mg BID by cardiologist.  He denies any recent episodes of chest pain.  Patient denies headache, dizziness, or palpitations.  Type II DM: His HbA1c has improved to 6.4 now.  He takes metformin 1000 mg twice daily, but has chronic loose bowel movement.  He denies any polyuria or polyphagia currently.  He has chronic right hip pain, s/p right THA.  He takes naproxen as needed for pain.  He had steroid injection for hip pain as well, which has helped him.  He still complains of bilateral feet burning pain, but is better with gabapentin 300 mg twice daily.  He had [place] ABI screening, which showed - Normal bilateral resting ankle-brachial indices. He denies any claudication symptoms.    Past Medical History:  Diagnosis Date   Acid reflux    Diabetes mellitus without complication (HCC)    HLD (hyperlipidemia)    HTN (hypertension)     Past Surgical History:  Procedure Laterality Date   BIOPSY  01/18/2023   Procedure: BIOPSY;   Surgeon: Lanelle Bal, DO;  [facility];  Service: Endoscopy;;   COLONOSCOPY WITH PROPOFOL N/A 07/27/2022   Procedure: COLONOSCOPY WITH PROPOFOL;  Surgeon: Lanelle Bal, DO;  [facility];  Service: Endoscopy;  Laterality: N/A;  9:00 am  ASA 2   ESOPHAGOGASTRODUODENOSCOPY  2002   RMR: ulcerative reflux esophagitis, moderate sized hiatal hernia   ESOPHAGOGASTRODUODENOSCOPY (EGD) WITH PROPOFOL N/A 01/18/2023   Procedure: ESOPHAGOGASTRODUODENOSCOPY (EGD) WITH PROPOFOL;  Surgeon: Lanelle Bal, DO;  [facility];  Service: Endoscopy;  Laterality: N/A;  1:45 pm, asa 2   HIP SURGERY Bilateral    as a child   POLYPECTOMY  07/27/2022   Procedure: POLYPECTOMY;  Surgeon: Lanelle Bal, DO;  [facility];  Service: Endoscopy;;   TOOTH EXTRACTION     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   Procedure: TOTAL HIP ARTHROPLASTY;  Surgeon: Vickki Hearing, MD;  Location: [facility];  Service: Orthopedics;  Laterality: Right;   WRIST SURGERY      Family History  Problem Relation Age of Onset   Diabetes Mother    Heart disease Mother    Diabetes Father    Heart disease Father    Colon cancer Neg Hx     Social History   Socioeconomic History   Marital status: Single    Spouse name: Not on file  Number of children: Not on file   Years of education: Not on file   Highest education level: Not on file  Occupational History   Not on file  Tobacco Use   Smoking status: Every Day    Packs/day: .5    Types: Cigarettes   Smokeless tobacco: Never  Vaping Use   Vaping Use: Never used  Substance and Sexual Activity   Alcohol use: Yes    Comment: beer and liquor twice weekly   Drug use: No    Comment: "crack" former- last about 3 years ago.   Sexual activity: Not on file  Other Topics Concern   Not on file  Social History Narrative   Not on file   Social Determinants of Health   Financial Resource Strain: Not on file  Food  Insecurity: Not on file  Transportation Needs: Not on file  Physical Activity: Not on file  Stress: Not on file  Social Connections: Not on file  Intimate Partner Violence: Not on file    Outpatient Medications Prior to Visit  Medication Sig Dispense Refill   acetaminophen (TYLENOL) 500 MG tablet Take 2 tablets (1,000 mg total) by mouth every 6 (six) hours. 30 tablet 0   albuterol (VENTOLIN HFA) 108 (90 Base) MCG/ACT inhaler Inhale 2 puffs into the lungs every 6 (six) hours as needed for wheezing or shortness of breath. 8 g 3   amLODipine (NORVASC) 5 MG tablet TAKE 1 TABLET (5 MG TOTAL) BY MOUTH DAILY. 90 tablet 1   aspirin EC 81 MG tablet Take 1 tablet (81 mg total) by mouth daily. Swallow whole. 90 tablet 3   atorvastatin (LIPITOR) 20 MG tablet TAKE 1 TABLET BY MOUTH EVERY DAY 90 tablet 1   blood glucose meter kit and supplies Dispense based on patient and insurance preference. Use up to four times daily as directed. (FOR ICD-10 E10.9, E11.9). 1 each 0   gabapentin (NEURONTIN) 300 MG capsule TAKE 1 CAPSULE BY MOUTH TWICE A DAY 60 capsule 5   latanoprost (XALATAN) 0.005 % ophthalmic solution Place 1 drop into both eyes at bedtime.     metoprolol tartrate (LOPRESSOR) 25 MG tablet TAKE 1 TABLET BY MOUTH (2) TIMES A DAY. 180 tablet 2   naproxen (NAPROSYN) 500 MG tablet TAKE 1 TABLET BY MOUTH TWICE A DAY WITH FOOD 60 tablet 2   nitroGLYCERIN (NITROSTAT) 0.4 MG SL tablet Place 1 tablet (0.4 mg total) under the tongue every 5 (five) minutes as needed for chest pain. 10 tablet 1   pantoprazole (PROTONIX) 40 MG tablet Take 1 tablet (40 mg total) by mouth 2 (two) times daily. 60 tablet 11   telmisartan (MICARDIS) 40 MG tablet TAKE 1 TABLET BY MOUTH EVERY DAY 90 tablet 1   metFORMIN (GLUCOPHAGE) 1000 MG tablet TAKE 1 TABLET BY MOUTH TWICE DAILY WITH MEALS. 60 tablet 0   No facility-administered medications prior to visit.    Allergies  Allergen Reactions   Aspirin Nausea Only    ROS Review  of Systems  Constitutional:  Negative for chills and fever.  HENT:  Negative for congestion and sore throat.   Eyes:  Positive for visual disturbance. Negative for pain and discharge.  Respiratory:  Negative for cough and shortness of breath.   Cardiovascular:  Negative for chest pain and palpitations.  Gastrointestinal:  Negative for diarrhea, nausea and vomiting.  Endocrine: Negative for polydipsia and polyuria.  Genitourinary:  Negative for dysuria and hematuria.  Musculoskeletal:  Positive for arthralgias and back  pain. Negative for neck pain and neck stiffness.  Skin:  Negative for rash.  Neurological:  Positive for numbness (In left leg and feet). Negative for dizziness, weakness and headaches.  Psychiatric/Behavioral:  Negative for agitation and behavioral problems.       Objective:    Physical Exam Vitals reviewed.  Constitutional:      General: He is not in acute distress.    Appearance: He is not diaphoretic.  HENT:     Head: Normocephalic and atraumatic.     Nose: Nose normal.     Mouth/Throat:     Mouth: Mucous membranes are moist.  Eyes:     General: No scleral icterus.    Extraocular Movements: Extraocular movements intact.  Neck:     Vascular: No carotid bruit.  Cardiovascular:     Rate and Rhythm: Normal rate and regular rhythm.     Pulses: Normal pulses.     Heart sounds: Normal heart sounds. No murmur heard. Pulmonary:     Breath sounds: Normal breath sounds. No wheezing or rales.  Musculoskeletal:     Cervical back: Neck supple. No tenderness.     Right lower leg: No edema.     Left lower leg: No edema.  Skin:    General: Skin is warm.     Findings: No rash.  Neurological:     General: No focal deficit present.     Mental Status: He is alert and oriented to person, place, and time.     Cranial Nerves: No cranial nerve deficit.     Motor: No weakness.  Psychiatric:        Mood and Affect: Mood normal.        Behavior: Behavior normal.     BP  138/86 (BP Location: Left Arm)   Pulse (!) 57   Ht 5\' 11"  (1.803 m)   Wt 193 lb (87.5 kg)   SpO2 98%   BMI 26.92 kg/m  Wt Readings from Last 3 Encounters:  04/25/23 193 lb (87.5 kg)  01/25/23 194 lb 3.2 oz (88.1 kg)  01/18/23 201 lb (91.2 kg)    Lab Results  Component Value Date   TSH 0.821 04/18/2022   Lab Results  Component Value Date   WBC 9.6 08/21/2022   HGB 13.5 08/21/2022   HCT 41.8 08/21/2022   MCV 87 08/21/2022   PLT 372 08/21/2022   Lab Results  Component Value Date   NA 141 11/30/2022   K 4.1 11/30/2022   CO2 22 11/30/2022   GLUCOSE 112 (H) 11/30/2022   BUN 10 11/30/2022   CREATININE 1.13 11/30/2022   BILITOT <0.2 11/30/2022   ALKPHOS 72 11/30/2022   AST 16 11/30/2022   ALT 17 11/30/2022   PROT 7.0 11/30/2022   ALBUMIN 4.2 11/30/2022   CALCIUM 9.7 11/30/2022   ANIONGAP 11 08/21/2022   EGFR 77 11/30/2022   Lab Results  Component Value Date   CHOL 160 08/21/2022   Lab Results  Component Value Date   HDL 35 (L) 08/21/2022   Lab Results  Component Value Date   LDLCALC 30 08/21/2022   Lab Results  Component Value Date   TRIG 717 (HH) 08/21/2022   Lab Results  Component Value Date   CHOLHDL 4.6 08/21/2022   Lab Results  Component Value Date   HGBA1C 6.4 04/25/2023      Assessment & Plan:   Problem List Items Addressed This Visit       Cardiovascular and Mediastinum  HTN (hypertension) (Chronic)    BP Readings from Last 1 Encounters:  04/25/23 138/86  Well-controlled with Amlodipine 5 mg every day, Telmisartan 40  mg every day and metoprolol 25 mg twice daily Counseled for compliance with the medications Advised DASH diet and moderate exercise/walking, at least 150 mins/week      Relevant Orders   CMP14+EGFR   TSH   CBC with Differential/Platelet     Endocrine   Type 2 diabetes mellitus with diabetic neuropathy, unspecified (HCC) - Primary    Lab Results  Component Value Date   HGBA1C 6.4 04/25/2023  Well  controlled Likely due to oral steroid use, has stopped oral prednisone now On metformin to 1000 mg twice daily, but has chronic loose BM with it Switched to metformin ER 1000 mg daily Advised to follow diabetic diet On statin and ARB F/u CMP and lipid panel Diabetic eye exam: Advised to follow up with Ophthalmology for diabetic eye exam  On gabapentin 300 mg twice daily for neuropathy If persistent, will refer to Neurology      Relevant Medications   metFORMIN (GLUCOPHAGE-XR) 500 MG 24 hr tablet     Other   Mixed hyperlipidemia (Chronic)    Check lipid profile On Atorvastatin      Relevant Orders   Lipid panel   Prostate cancer screening    Ordered PSA after discussing its limitations for prostate cancer screening, including false positive results leading to additional investigations.      Relevant Orders   PSA   Meds ordered this encounter  Medications   metFORMIN (GLUCOPHAGE-XR) 500 MG 24 hr tablet    Sig: Take 2 tablets (1,000 mg total) by mouth daily with breakfast.    Dispense:  180 tablet    Refill:  1    Follow-up: Return in about 4 months (around 08/25/2023) for Annual physical.    Anabel Halon, MD

## 2023-05-07 ENCOUNTER — Ambulatory Visit: Payer: Medicaid Other | Attending: Internal Medicine | Admitting: Internal Medicine

## 2023-05-07 ENCOUNTER — Encounter: Payer: Self-pay | Admitting: Internal Medicine

## 2023-05-07 VITALS — BP 118/80 | HR 68 | Ht 71.0 in | Wt 191.2 lb

## 2023-05-07 DIAGNOSIS — I209 Angina pectoris, unspecified: Secondary | ICD-10-CM | POA: Diagnosis not present

## 2023-05-07 DIAGNOSIS — Z720 Tobacco use: Secondary | ICD-10-CM

## 2023-05-07 DIAGNOSIS — E782 Mixed hyperlipidemia: Secondary | ICD-10-CM

## 2023-05-07 DIAGNOSIS — I739 Peripheral vascular disease, unspecified: Secondary | ICD-10-CM

## 2023-05-07 DIAGNOSIS — I10 Essential (primary) hypertension: Secondary | ICD-10-CM

## 2023-05-07 NOTE — Progress Notes (Signed)
Cardiology Office Note  Date: 05/07/2023   ID: David Knox, DOB May 08, 1966, MRN [id number]  PCP:  Anabel Halon, MD  Cardiologist:  Marjo Bicker, MD Electrophysiologist:  None   Reason for Office Visit: Follow-up of chest pain   History of Present Illness: David Knox is a 57 y.o. male known to have HTN, DM 2, HLD, nicotine abuse is here for follow-up visit.  Patient was having substernal chest tightness radiating to the left arm x 1 year, lasting for few minutes, occurs with exertion (walking uphill to the mailbox, walking from [place] mall [place] the [place]) and resolves with rest. Frequency once per week. Associated with SOB.  NM stress test from 4/24 showed no evidence of ischemia.  Echocardiogram from 4/24 showed normal LVEF and no valvular heart disease. Normal ABIs in the lower extremities but the right tibial waveform is abnormal suggesting below the knee disease.  He is here for follow-up visit. After cutting down smoking, alcohol and walking, his chest pain significantly improved.  Past Medical History:  Diagnosis Date   Acid reflux    Diabetes mellitus without complication (HCC)    HLD (hyperlipidemia)    HTN (hypertension)     Past Surgical History:  Procedure Laterality Date   BIOPSY  01/18/2023   Procedure: BIOPSY;  Surgeon: Lanelle Bal, DO;  [facility];  Service: Endoscopy;;   COLONOSCOPY WITH PROPOFOL N/A 07/27/2022   Procedure: COLONOSCOPY WITH PROPOFOL;  Surgeon: Lanelle Bal, DO;  [facility];  Service: Endoscopy;  Laterality: N/A;  9:00 am  ASA 2   ESOPHAGOGASTRODUODENOSCOPY  2002   RMR: ulcerative reflux esophagitis, moderate sized hiatal hernia   ESOPHAGOGASTRODUODENOSCOPY (EGD) WITH PROPOFOL N/A 01/18/2023   Procedure: ESOPHAGOGASTRODUODENOSCOPY (EGD) WITH PROPOFOL;  Surgeon: Lanelle Bal, DO;  [facility];  Service: Endoscopy;  Laterality: N/A;  1:45 pm, asa 2   HIP SURGERY Bilateral    as a  child   POLYPECTOMY  07/27/2022   Procedure: POLYPECTOMY;  Surgeon: Lanelle Bal, DO;  [facility];  Service: Endoscopy;;   TOOTH EXTRACTION     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   Procedure: TOTAL HIP ARTHROPLASTY;  Surgeon: Vickki Hearing, MD;  Location: [facility];  Service: Orthopedics;  Laterality: Right;   WRIST SURGERY      Current Outpatient Medications  Medication Sig Dispense Refill   acetaminophen (TYLENOL) 500 MG tablet Take 2 tablets (1,000 mg total) by mouth every 6 (six) hours. 30 tablet 0   albuterol (VENTOLIN HFA) 108 (90 Base) MCG/ACT inhaler Inhale 2 puffs into the lungs every 6 (six) hours as needed for wheezing or shortness of breath. 8 g 3   amLODipine (NORVASC) 5 MG tablet TAKE 1 TABLET (5 MG TOTAL) BY MOUTH DAILY. 90 tablet 1   aspirin EC 81 MG tablet Take 1 tablet (81 mg total) by mouth daily. Swallow whole. 90 tablet 3   atorvastatin (LIPITOR) 20 MG tablet TAKE 1 TABLET BY MOUTH EVERY DAY 90 tablet 1   blood glucose meter kit and supplies Dispense based on patient and insurance preference. Use up to four times daily as directed. (FOR ICD-10 E10.9, E11.9). 1 each 0   gabapentin (NEURONTIN) 300 MG capsule TAKE 1 CAPSULE BY MOUTH TWICE A DAY 60 capsule 5   latanoprost (XALATAN) 0.005 % ophthalmic solution Place 1 drop into both eyes at bedtime.     metFORMIN (GLUCOPHAGE-XR) 500 MG 24 hr tablet  Take 2 tablets (1,000 mg total) by mouth daily with breakfast. 180 tablet 1   metoprolol tartrate (LOPRESSOR) 25 MG tablet TAKE 1 TABLET BY MOUTH (2) TIMES A DAY. 180 tablet 2   naproxen (NAPROSYN) 500 MG tablet TAKE 1 TABLET BY MOUTH TWICE A DAY WITH FOOD 60 tablet 2   nitroGLYCERIN (NITROSTAT) 0.4 MG SL tablet Place 1 tablet (0.4 mg total) under the tongue every 5 (five) minutes as needed for chest pain. 10 tablet 1   pantoprazole (PROTONIX) 40 MG tablet Take 1 tablet (40 mg total) by mouth 2 (two) times daily. 60 tablet 11   telmisartan (MICARDIS) 40 MG  tablet TAKE 1 TABLET BY MOUTH EVERY DAY 90 tablet 1   No current facility-administered medications for this visit.   Allergies:  Aspirin   Social History: The patient  reports that he has been smoking cigarettes. He has been smoking an average of .5 packs per day. He has never used smokeless tobacco. He reports current alcohol use. He reports that he does not use drugs.   Family History: The patient's family history includes Diabetes in his father and mother; Heart disease in his father and mother.   ROS:  Please see the history of present illness. Otherwise, complete review of systems is positive for none.  All other systems are reviewed and negative.   Physical Exam: VS:  BP 118/80   Pulse 68   Ht 5\' 11"  (1.803 m)   Wt 191 lb 3.2 oz (86.7 kg)   SpO2 96%   BMI 26.67 kg/m , BMI Body mass index is 26.67 kg/m.  Wt Readings from Last 3 Encounters:  05/07/23 191 lb 3.2 oz (86.7 kg)  04/25/23 193 lb (87.5 kg)  01/25/23 194 lb 3.2 oz (88.1 kg)    General: Patient appears comfortable at rest. HEENT: Conjunctiva and lids normal, oropharynx clear with moist mucosa. Neck: Supple, no elevated JVP or carotid bruits, no thyromegaly. Lungs: Clear to auscultation, nonlabored breathing at rest. Cardiac: Regular rate and rhythm, no S3 or significant systolic murmur, no pericardial rub. Abdomen: Soft, nontender, no hepatomegaly, bowel sounds present, no guarding or rebound. Extremities: No pitting edema, distal pulses 2+. Skin: Warm and dry. Musculoskeletal: No kyphosis. Neuropsychiatric: Alert and oriented x3, affect grossly appropriate.  ECG: NSR  Recent Labwork: 05/18/2022: Magnesium 1.7 08/21/2022: Hemoglobin 13.5; Platelets 372 11/30/2022: ALT 17; AST 16; BUN 10; Creatinine, Ser 1.13; Potassium 4.1; Sodium 141     Component Value Date/Time   CHOL 160 08/21/2022 0948   TRIG 717 (HH) 08/21/2022 0948   HDL 35 (L) 08/21/2022 0948   CHOLHDL 4.6 08/21/2022 0948   LDLCALC 30 08/21/2022  0948    Assessment and Plan: Patient is a 57 year old M known to have HTN, DM 2, HLD, nicotine abuse is here for follow-up visit..  # Cardiac chest pain -Patient was having substernal chest tightness radiating to the left arm x 1 year, lasting for few minutes, occurs with exertion (walking uphill to the mailbox, walking from the mall to the [place]) and resolves with rest. Frequency once per week. Associated with SOB.  NM stress test from 4/24 showed no evidence of ischemia.  These chest pains have decreased in frequency after he cut down smoking, alcohol and walking.  Will reevaluate the symptoms in the next follow-up visit in 6 months and obtain cardiac PET if required, to rule out microvascular coronary artery disease. -SL NTG 0.4 mg as needed and continue metoprolol tartrate 25 mg twice daily.  #  Bilateral lower extremity claudication (leg fatigue/tightness while walking), rule out PAD -R ABI 1.26 and L ABI 1.24.  The right posterior tibial waveform is abnormal suggesting localized below the knee disease.  # HTN, controlled -Continue amlodipine 5 mg once daily, telmisartan 40 mg once daily, HTN per PCP.  # HLD, at goal -Continue atorvastatin 20 mg nightly, goal LDL less than 100.  # Nicotine abuse - Smoking cessation instruction/counseling given:  counseled patient on the dangers of tobacco use, advised patient to stop smoking, and reviewed strategies to maximize success.  Patient cut down smoking significantly.  I have spent a total of 45 minutes with patient reviewing chart, EKGs, labs and examining patient as well as establishing an assessment and plan that was discussed with the patient.  > 50% of time was spent in direct patient care.     Medication Adjustments/Labs and Tests Ordered: Current medicines are reviewed at length with the patient today.  Concerns regarding medicines are outlined above.   Tests Ordered: No orders of the defined types were placed in this encounter.     Medication Changes: No orders of the defined types were placed in this encounter.     Disposition:  Follow up 6 months  Signed, Siyona Coto Verne Spurr, MD, 05/07/2023 3:25 PM    [facility] [address] [place], [place] [address]

## 2023-05-07 NOTE — Patient Instructions (Signed)
Medication Instructions:  Your physician recommends that you continue on your current medications as directed. Please refer to the Current Medication list given to you today.   Labwork: None today  Testing/Procedures: None today  Follow-Up: 6 months  Any Other Special Instructions Will Be Listed Below (If Applicable).  If you need a refill on your cardiac medications before your next appointment, please call your pharmacy.  

## 2023-05-18 ENCOUNTER — Other Ambulatory Visit: Payer: Self-pay | Admitting: Internal Medicine

## 2023-05-18 DIAGNOSIS — I10 Essential (primary) hypertension: Secondary | ICD-10-CM

## 2023-06-06 ENCOUNTER — Ambulatory Visit (INDEPENDENT_AMBULATORY_CARE_PROVIDER_SITE_OTHER): Payer: Medicaid Other | Admitting: Family Medicine

## 2023-06-06 ENCOUNTER — Encounter: Payer: Self-pay | Admitting: Family Medicine

## 2023-06-06 VITALS — BP 118/77 | HR 61 | Ht 71.0 in | Wt 192.0 lb

## 2023-06-06 DIAGNOSIS — R197 Diarrhea, unspecified: Secondary | ICD-10-CM | POA: Diagnosis not present

## 2023-06-06 NOTE — Progress Notes (Unsigned)
Acute Office Visit  Subjective:    Patient ID: David Knox, male    DOB: 01-Mar-1966, 57 y.o.   MRN: [id number]  Chief Complaint  Patient presents with   Diarrhea    Pt reports diarrhea, has been having issues with everything he eats goes right through him.     HPI Patient is in today with reports of chronic diarrhea.  He reports following up with GI.  No fever, chills, abdominal pain, blood or pus in his stool.  He takes metformin at 1000 mg twice daily and reports symptom onset when he started taking metformin.  He takes Imodium as needed.  Symptom ongoing for about 6 months.  No red flag symptoms reported.  He has had 2 loose stools today.  Past Medical History:  Diagnosis Date   Acid reflux    Diabetes mellitus without complication (HCC)    HLD (hyperlipidemia)    HTN (hypertension)     Past Surgical History:  Procedure Laterality Date   BIOPSY  01/18/2023   Procedure: BIOPSY;  Surgeon: Lanelle Bal, DO;  Location: [facility];  Service: Endoscopy;;   COLONOSCOPY WITH PROPOFOL N/A 07/27/2022   Procedure: COLONOSCOPY WITH PROPOFOL;  Surgeon: Lanelle Bal, DO;  Location: [facility];  Service: Endoscopy;  Laterality: N/A;  9:00 am  ASA 2   ESOPHAGOGASTRODUODENOSCOPY  2002   RMR: ulcerative reflux esophagitis, moderate sized hiatal hernia   ESOPHAGOGASTRODUODENOSCOPY (EGD) WITH PROPOFOL N/A 01/18/2023   Procedure: ESOPHAGOGASTRODUODENOSCOPY (EGD) WITH PROPOFOL;  Surgeon: Lanelle Bal, DO;  [facility]: [facility];  Service: Endoscopy;  Laterality: N/A;  1:45 pm, asa 2   HIP SURGERY Bilateral    as a child   POLYPECTOMY  07/27/2022   Procedure: POLYPECTOMY;  Surgeon: Lanelle Bal, DO;  [facility]: [facility];  Service: Endoscopy;;   TOOTH EXTRACTION     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   Procedure: TOTAL HIP ARTHROPLASTY;  Surgeon: Vickki Hearing, MD;  Location: [facility];  Service: Orthopedics;  Laterality: Right;   WRIST SURGERY       Family History  Problem Relation Age of Onset   Diabetes Mother    Heart disease Mother    Diabetes Father    Heart disease Father    Colon cancer Neg Hx     Social History   Socioeconomic History   Marital status: Single    Spouse name: Not on file   Number of children: Not on file   Years of education: Not on file   Highest education level: Not on file  Occupational History   Not on file  Tobacco Use   Smoking status: Every Day    Current packs/day: 0.50    Types: Cigarettes   Smokeless tobacco: Never   Tobacco comments:    5 ciggs per day - 05/07/2023  Vaping Use   Vaping status: Never Used  Substance and Sexual Activity   Alcohol use: Yes    Comment: beer and liquor twice weekly   Drug use: No    Comment: "crack" former- last about 3 years ago.   Sexual activity: Not on file  Other Topics Concern   Not on file  Social History Narrative   Not on file   Social Determinants of Health   Financial Resource Strain: Not on file  Food Insecurity: Not on file  Transportation Needs: Not on file  Physical Activity: Not on file  Stress: Not on file  Social Connections:  Not on file  Intimate Partner Violence: Not on file    Outpatient Medications Prior to Visit  Medication Sig Dispense Refill   acetaminophen (TYLENOL) 500 MG tablet Take 2 tablets (1,000 mg total) by mouth every 6 (six) hours. 30 tablet 0   albuterol (VENTOLIN HFA) 108 (90 Base) MCG/ACT inhaler Inhale 2 puffs into the lungs every 6 (six) hours as needed for wheezing or shortness of breath. 8 g 3   amLODipine (NORVASC) 5 MG tablet TAKE 1 TABLET (5 MG TOTAL) BY MOUTH DAILY. 90 tablet 1   aspirin EC 81 MG tablet Take 1 tablet (81 mg total) by mouth daily. Swallow whole. 90 tablet 3   atorvastatin (LIPITOR) 20 MG tablet TAKE 1 TABLET BY MOUTH EVERY DAY 90 tablet 1   blood glucose meter kit and supplies Dispense based on patient and insurance preference. Use up to four times daily as directed. (FOR  ICD-10 E10.9, E11.9). 1 each 0   gabapentin (NEURONTIN) 300 MG capsule TAKE 1 CAPSULE BY MOUTH TWICE A DAY 60 capsule 5   latanoprost (XALATAN) 0.005 % ophthalmic solution Place 1 drop into both eyes at bedtime.     metFORMIN (GLUCOPHAGE-XR) 500 MG 24 hr tablet Take 2 tablets (1,000 mg total) by mouth daily with breakfast. 180 tablet 1   metoprolol tartrate (LOPRESSOR) 25 MG tablet TAKE 1 TABLET BY MOUTH (2) TIMES A DAY. 180 tablet 2   naproxen (NAPROSYN) 500 MG tablet TAKE 1 TABLET BY MOUTH TWICE A DAY WITH FOOD 60 tablet 2   nitroGLYCERIN (NITROSTAT) 0.4 MG SL tablet Place 1 tablet (0.4 mg total) under the tongue every 5 (five) minutes as needed for chest pain. 10 tablet 1   pantoprazole (PROTONIX) 40 MG tablet Take 1 tablet (40 mg total) by mouth 2 (two) times daily. 60 tablet 11   telmisartan (MICARDIS) 40 MG tablet TAKE 1 TABLET BY MOUTH EVERY DAY 90 tablet 1   No facility-administered medications prior to visit.    Allergies  Allergen Reactions   Aspirin Nausea Only    Review of Systems  Constitutional:  Negative for fatigue and fever.  Eyes:  Negative for visual disturbance.  Respiratory:  Negative for chest tightness and shortness of breath.   Cardiovascular:  Negative for chest pain and palpitations.  Gastrointestinal:  Positive for diarrhea.  Neurological:  Negative for dizziness and headaches.       Objective:    Physical Exam HENT:     Head: Normocephalic.     Right Ear: External ear normal.     Left Ear: External ear normal.     Nose: No congestion or rhinorrhea.     Mouth/Throat:     Mouth: Mucous membranes are moist.  Cardiovascular:     Rate and Rhythm: Regular rhythm.     Heart sounds: No murmur heard. Pulmonary:     Effort: No respiratory distress.     Breath sounds: Normal breath sounds.  Neurological:     Mental Status: He is alert.     BP 118/77   Pulse 61   Ht 5\' 11"  (1.803 m)   Wt 192 lb 0.6 oz (87.1 kg)   SpO2 97%   BMI 26.78 kg/m  Wt  Readings from Last 3 Encounters:  06/06/23 192 lb 0.6 oz (87.1 kg)  05/07/23 191 lb 3.2 oz (86.7 kg)  04/25/23 193 lb (87.5 kg)       Assessment & Plan:  There are no diagnoses linked to this encounter.  Gilmore Laroche, FNP

## 2023-06-06 NOTE — Patient Instructions (Addendum)
I appreciate the opportunity to provide care to you today!    Follow up:  2 week with PCP    Diarrhea Start taking metformin 500 mg once daily Follow up with PCP in 2 week Continue imodium  I recommend taking OTC probiotics supplement to restore the natural balance of gut bacterial Follow up with GI as soon as possible  Attached with your AVS, you will find valuable resources for self-education. I highly recommend dedicating some time to thoroughly examine them.   Please continue to a heart-healthy diet and increase your physical activities. Try to exercise for at least five days a week.    It was a pleasure to see you and I look forward to continuing to work together on your health and well-being. Please do not hesitate to call the office if you need care or have questions about your care.  In case of emergency, please visit the Emergency Department for urgent care, or contact our clinic at [phone] to schedule an appointment. We're here to help you!   Have a wonderful day and week. With Gratitude, Gilmore Laroche MSN, FNP-BC

## 2023-06-07 DIAGNOSIS — R197 Diarrhea, unspecified: Secondary | ICD-10-CM | POA: Insufficient documentation

## 2023-06-07 NOTE — Assessment & Plan Note (Signed)
Encouraged to take metformin 500 mg daily Last hemoglobin A1c  taken on 11/30/2022 was 7.0 Encouraged to follow up with PCP in 2 weeks Encouraged to continue taking Imodium as needed Encouraged to follow-up with GI if his symptoms are unrelieved Recommended taking OTC probiotics supplement to restore the natural balance of gut bacterial

## 2023-06-11 NOTE — Progress Notes (Deleted)
GI Office Note    Referring Provider: Anabel Halon, MD Primary Care Physician:  Anabel Halon, MD Primary Gastroenterologist: Hennie Duos. Marletta Lor, DO  Date:  06/11/2023  ID:  David Knox, DOB 1965/11/18, MRN [id number]   Chief Complaint   No chief complaint on file.  History of Present Illness  David Knox is a 57 y.o. male with a history of *** presenting today with complaint of   Office visit 06/29/2022 to discuss scheduling first-ever screening colonoscopy.  He denied any melena, BRBPR, unintentional weight loss, family history of colon cancer.  Did report postprandial bowel movements stating is a chronic issue.  Patient has a bowel movement 30 minutes after eating.  Mostly formed stools, sometimes runny.  Dairy products can cause looser stools.  Sometimes having 5-6 BMs daily without nocturnal stools.  Denied abdominal pain.  GERD controlled with omeprazole 20 mg over-the-counter daily.  Patient reported not being bothered by stools, lactose intolerance suspected.  Advised screening for celiac disease with serologies.  Prior thyroid levels within normal limits.  Scheduled for colonoscopy (urine drug screen advised).  Advised Lactaid prior to dairy consumption and advised to stop omeprazole start pantoprazole in case omeprazole causes frequent diarrhea.   IgA elevated, however TTG IgA negative.   Colonoscopy 07/27/2022: -Transverse and descending diverticulosis -4 mm polyp in descending colon -Diminutive hyperplastic polyp, negative for dysplasia -Repeat colonoscopy in 10 years   Office visit 10/26/22. Having loose BM after every meal. Occurring sooner after meals. Still has gallbladder and reports this is same issues as his wife. Tries to avoid dairy products. Stools are bristol 5-7. Started metformin in July /August 2023 after hip surgery. Originally stated symptoms began in July/August of 2023. GERD controlled. Advised imodium as needed. Advised to discuss stopping  metformin with PCP. Continue pantoprazole daily. Advised potential further workup with fecal elastase and possible abdominal imaging if imodium and stopping metformin not helpful.  Last office visit 01/10/23. Having 1 bowel movement per day, sometimes 2.  Still on metformin and frequency getting better with more solid stools.  Occasionally having watery stools but overall [place] 3.  GERD controlled with pantoprazole once daily.  Noted alcohol use twice per week.  Reported a possible recent coffee-ground emesis. Advised to continue pantoprazole 20 mg once daily.  Scheduled for EGD.  EGD 01/18/23: -3 cm hiatal hernia -Grade a reflux esophagitis without bleeding s/p biopsy -Gastritis s/p biopsy -Duodenitis with history of biopsy -Pathology -duodenal biopsies with foveolar metaplasia suggesting peptic injury gastric biopsies with reactive gastropathy, negative for H. pylori, GE junction with mild reflux changes - Advised PPI BID  PCP recommended decreasing his metformin from 1000 mg twice daily to 500 mg daily given his reports of diarrhea.  He was advised to continue to take Imodium as needed.  Also recommended over-the-counter probiotic.  Today:   Current Outpatient Medications  Medication Sig Dispense Refill   acetaminophen (TYLENOL) 500 MG tablet Take 2 tablets (1,000 mg total) by mouth every 6 (six) hours. 30 tablet 0   albuterol (VENTOLIN HFA) 108 (90 Base) MCG/ACT inhaler Inhale 2 puffs into the lungs every 6 (six) hours as needed for wheezing or shortness of breath. 8 g 3   amLODipine (NORVASC) 5 MG tablet TAKE 1 TABLET (5 MG TOTAL) BY MOUTH DAILY. 90 tablet 1   aspirin EC 81 MG tablet Take 1 tablet (81 mg total) by mouth daily. Swallow whole. 90 tablet 3   atorvastatin (LIPITOR) 20 MG tablet TAKE 1  TABLET BY MOUTH EVERY DAY 90 tablet 1   blood glucose meter kit and supplies Dispense based on patient and insurance preference. Use up to four times daily as directed. (FOR ICD-10 E10.9,  E11.9). 1 each 0   gabapentin (NEURONTIN) 300 MG capsule TAKE 1 CAPSULE BY MOUTH TWICE A DAY 60 capsule 5   latanoprost (XALATAN) 0.005 % ophthalmic solution Place 1 drop into both eyes at bedtime.     metFORMIN (GLUCOPHAGE-XR) 500 MG 24 hr tablet Take 2 tablets (1,000 mg total) by mouth daily with breakfast. 180 tablet 1   metoprolol tartrate (LOPRESSOR) 25 MG tablet TAKE 1 TABLET BY MOUTH (2) TIMES A DAY. 180 tablet 2   naproxen (NAPROSYN) 500 MG tablet TAKE 1 TABLET BY MOUTH TWICE A DAY WITH FOOD 60 tablet 2   nitroGLYCERIN (NITROSTAT) 0.4 MG SL tablet Place 1 tablet (0.4 mg total) under the tongue every 5 (five) minutes as needed for chest pain. 10 tablet 1   pantoprazole (PROTONIX) 40 MG tablet Take 1 tablet (40 mg total) by mouth 2 (two) times daily. 60 tablet 11   telmisartan (MICARDIS) 40 MG tablet TAKE 1 TABLET BY MOUTH EVERY DAY 90 tablet 1   No current facility-administered medications for this visit.    Past Medical History:  Diagnosis Date   Acid reflux    Diabetes mellitus without complication (HCC)    HLD (hyperlipidemia)    HTN (hypertension)     Past Surgical History:  Procedure Laterality Date   BIOPSY  01/18/2023   Procedure: BIOPSY;  Surgeon: Lanelle Bal, DO;  [facility]: [facility];  Service: Endoscopy;;   COLONOSCOPY WITH PROPOFOL N/A 07/27/2022   Procedure: COLONOSCOPY WITH PROPOFOL;  Surgeon: Lanelle Bal, DO;  [facility]: [facility];  Service: Endoscopy;  Laterality: N/A;  9:00 am  ASA 2   ESOPHAGOGASTRODUODENOSCOPY  2002   RMR: ulcerative reflux esophagitis, moderate sized hiatal hernia   ESOPHAGOGASTRODUODENOSCOPY (EGD) WITH PROPOFOL N/A 01/18/2023   Procedure: ESOPHAGOGASTRODUODENOSCOPY (EGD) WITH PROPOFOL;  Surgeon: Lanelle Bal, DO;  [facility];  Service: Endoscopy;  Laterality: N/A;  1:45 pm, asa 2   HIP SURGERY Bilateral    as a child   POLYPECTOMY  07/27/2022   Procedure: POLYPECTOMY;  Surgeon: Lanelle Bal, DO;   [facility]: [facility];  Service: Endoscopy;;   TOOTH EXTRACTION     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   Procedure: TOTAL HIP ARTHROPLASTY;  Surgeon: Vickki Hearing, MD;  Location: [facility];  Service: Orthopedics;  Laterality: Right;   WRIST SURGERY      Family History  Problem Relation Age of Onset   Diabetes Mother    Heart disease Mother    Diabetes Father    Heart disease Father    Colon cancer Neg Hx     Allergies as of 06/12/2023 - Review Complete 06/06/2023  Allergen Reaction Noted   Aspirin Nausea Only 05/18/2022    Social History   Socioeconomic History   Marital status: Single    Spouse name: Not on file   Number of children: Not on file   Years of education: Not on file   Highest education level: Not on file  Occupational History   Not on file  Tobacco Use   Smoking status: Every Day    Current packs/day: 0.50    Types: Cigarettes   Smokeless tobacco: Never   Tobacco comments:    5 ciggs per day - 05/07/2023  Vaping Use  Vaping status: Never Used  Substance and Sexual Activity   Alcohol use: Yes    Comment: beer and liquor twice weekly   Drug use: No    Comment: "crack" former- last about 3 years ago.   Sexual activity: Not on file  Other Topics Concern   Not on file  Social History Narrative   Not on file   Social Determinants of Health   Financial Resource Strain: Not on file  Food Insecurity: Not on file  Transportation Needs: Not on file  Physical Activity: Not on file  Stress: Not on file  Social Connections: Not on file     Review of Systems   Gen: Denies fever, chills, anorexia. Denies fatigue, weakness, weight loss.  CV: Denies chest pain, palpitations, syncope, peripheral edema, and claudication. Resp: Denies dyspnea at rest, cough, wheezing, coughing up blood, and pleurisy. GI: See HPI Derm: Denies rash, itching, dry skin Psych: Denies depression, anxiety, memory loss, confusion. No homicidal or suicidal ideation.   Heme: Denies bruising, bleeding, and enlarged lymph nodes.  Physical Exam   There were no vitals taken for this visit.  General:   Alert and oriented. No distress noted. Pleasant and cooperative.  Head:  Normocephalic and atraumatic. Eyes:  Conjuctiva clear without scleral icterus. Mouth:  Oral mucosa pink and moist. Good dentition. No lesions. Lungs:  Clear to auscultation bilaterally. No wheezes, rales, or rhonchi. No distress.  Heart:  S1, S2 present without murmurs appreciated.  Abdomen:  +BS, soft, non-tender and non-distended. No rebound or guarding. No HSM or masses noted. Rectal: *** Msk:  Symmetrical without gross deformities. Normal posture. Extremities:  Without edema. Neurologic:  Alert and  oriented x4 Psych:  Alert and cooperative. Normal mood and affect.   Assessment  CREIG POSTLEWAITE is a 57 y.o. male with a history of HLD, HTN, cocaine use (sober for 3-4 years), and GERD presenting today for follow-up on diarrhea.  GERD, esophagitis/gastritis: Prior to last office visit he reported possible coffee-ground emesis.  Recently underwent EGD in March which revealed esophagitis, gastritis, and duodenitis likely secondary to uncontrolled acid reflux.  Currently maintained on pantoprazole 40 mg twice daily and ***  Diarrhea: Prior workup with negative celiac serologies and normal TSH.  Symptoms began shortly after starting on metformin.  He was taking 1000 mg twice daily however PCP just recently recommended reduction to 500 mg once daily (7/31).  Notes initially began he was having 4-bowel movements daily however his last visit he reportedly was having 1-2/day and were more formed without the need to take Imodium. ***  PLAN   ***     Brooke Bonito, MSN, FNP-BC, AGACNP-BC [facility]

## 2023-06-12 ENCOUNTER — Ambulatory Visit: Payer: Medicaid Other | Admitting: Gastroenterology

## 2023-06-12 NOTE — Progress Notes (Unsigned)
GI Office Note    Referring Provider: Anabel Halon, MD Primary Care Physician:  Anabel Halon, MD Primary Gastroenterologist: Hennie Duos. Marletta Lor, DO  Date:  06/13/2023  ID:  David Knox, DOB [birth year], MRN [id number]   Chief Complaint   Chief Complaint  Patient presents with   Diarrhea    Loose watery stools 4 or 5 times a day.    History of Present Illness  David Knox is a 57 y.o. male with a history of HLD, HTN, cocaine use (sober for 3-4 years), and GERD presenting today for follow-up on diarrhea.     Office visit 06/29/2022 to discuss scheduling first-ever screening colonoscopy.  He denied any melena, BRBPR, unintentional weight loss, family history of colon cancer.  Did report postprandial bowel movements stating is a chronic issue.  Patient has a bowel movement 30 minutes after eating.  Mostly formed stools, sometimes runny.  Dairy products can cause looser stools.  Sometimes having 5-6 BMs daily without nocturnal stools.  Denied abdominal pain.  GERD controlled with omeprazole 20 mg over-the-counter daily.  Patient reported not being bothered by stools, lactose intolerance suspected.  Advised screening for celiac disease with serologies.  Prior thyroid levels within normal limits. Scheduled for colonoscopy (urine drug screen advised).  Advised Lactaid prior to dairy consumption and advised to stop omeprazole start pantoprazole in case omeprazole causes frequent diarrhea.   IgA elevated, however TTG IgA negative.   Colonoscopy 07/27/2022: -Transverse and descending diverticulosis -4 mm polyp in descending colon -Diminutive hyperplastic polyp, negative for dysplasia -Repeat colonoscopy in 10 years   Office visit 10/26/22. Having loose BM after every meal. Occurring sooner after meals. Still has gallbladder and reports this is same issues as his wife. Tries to avoid dairy products. Stools are bristol 5-7. Started metformin in July /August 2023 after hip surgery.  Originally stated symptoms began in July/August of 2023. GERD controlled. Advised imodium as needed. Advised to discuss stopping metformin with PCP. Continue pantoprazole daily. Advised potential further workup with fecal elastase and possible abdominal imaging if imodium and stopping metformin not helpful.   Last office visit 01/10/23. Having 1 bowel movement per day, sometimes 2.  Still on metformin and frequency getting better with more solid stools.  Occasionally having watery stools but overall Bristol 3.  GERD controlled with pantoprazole once daily.  Noted alcohol use twice per week.  Reported a possible recent coffee-ground emesis. Advised to continue pantoprazole 20 mg once daily.  Scheduled for EGD.   EGD 01/18/23: -3 cm hiatal hernia -Grade a reflux esophagitis without bleeding s/p biopsy -Gastritis s/p biopsy -Duodenitis with history of biopsy -Pathology -duodenal biopsies with foveolar metaplasia suggesting peptic injury gastric biopsies with reactive gastropathy, negative for H. pylori, GE junction with mild reflux changes - Advised PPI BID   PCP recommended decreasing his metformin from 1000 mg twice daily to 500 mg daily given his reports of diarrhea.  He was advised to continue to take Imodium as needed.  Also recommended over-the-counter probiotic.   Today: Taking 1 imodium daily and having 4-5 per day and little bits at a time. Is having BM about 10-145 minutes after he eats. No abdominal pain. Usually little bits but very small amounts at time. Bristol 6-7.   Got up this morning at 7:30 am and has already been 3 times. Sometimes will still go even if he hasn't eaten.   He is not eating dairy products. Has seen pieces of lettuce come in his stool  after eating.   No N/V. Decrease his metformin last week.   GERD is pretty well controlled. No dysphagia. Doing well on pantoprazole.    Current Outpatient Medications  Medication Sig Dispense Refill   acetaminophen (TYLENOL) 500  MG tablet Take 2 tablets (1,000 mg total) by mouth every 6 (six) hours. 30 tablet 0   albuterol (VENTOLIN HFA) 108 (90 Base) MCG/ACT inhaler Inhale 2 puffs into the lungs every 6 (six) hours as needed for wheezing or shortness of breath. 8 g 3   amLODipine (NORVASC) 5 MG tablet TAKE 1 TABLET (5 MG TOTAL) BY MOUTH DAILY. 90 tablet 1   aspirin EC 81 MG tablet Take 1 tablet (81 mg total) by mouth daily. Swallow whole. 90 tablet 3   atorvastatin (LIPITOR) 20 MG tablet TAKE 1 TABLET BY MOUTH EVERY DAY 90 tablet 1   blood glucose meter kit and supplies Dispense based on patient and insurance preference. Use up to four times daily as directed. (FOR ICD-10 E10.9, E11.9). 1 each 0   gabapentin (NEURONTIN) 300 MG capsule TAKE 1 CAPSULE BY MOUTH TWICE A DAY 60 capsule 5   latanoprost (XALATAN) 0.005 % ophthalmic solution Place 1 drop into both eyes at bedtime.     metFORMIN (GLUCOPHAGE-XR) 500 MG 24 hr tablet Take 2 tablets (1,000 mg total) by mouth daily with breakfast. 180 tablet 1   metoprolol tartrate (LOPRESSOR) 25 MG tablet TAKE 1 TABLET BY MOUTH (2) TIMES A DAY. 180 tablet 2   naproxen (NAPROSYN) 500 MG tablet TAKE 1 TABLET BY MOUTH TWICE A DAY WITH FOOD 60 tablet 2   nitroGLYCERIN (NITROSTAT) 0.4 MG SL tablet Place 1 tablet (0.4 mg total) under the tongue every 5 (five) minutes as needed for chest pain. 10 tablet 1   pantoprazole (PROTONIX) 40 MG tablet Take 1 tablet (40 mg total) by mouth 2 (two) times daily. 60 tablet 11   telmisartan (MICARDIS) 40 MG tablet TAKE 1 TABLET BY MOUTH EVERY DAY 90 tablet 1   No current facility-administered medications for this visit.    Past Medical History:  Diagnosis Date   Acid reflux    Diabetes mellitus without complication (HCC)    HLD (hyperlipidemia)    HTN (hypertension)     Past Surgical History:  Procedure Laterality Date   BIOPSY  01/18/2023   Procedure: BIOPSY;  Surgeon: Lanelle Bal, DO;  Location: [facility];  Service: Endoscopy;;    COLONOSCOPY WITH PROPOFOL N/A 07/27/2022   Procedure: COLONOSCOPY WITH PROPOFOL;  Surgeon: Lanelle Bal, DO;  Location: [facility];  Service: Endoscopy;  Laterality: N/A;  9:00 am  ASA 2   ESOPHAGOGASTRODUODENOSCOPY  2002   RMR: ulcerative reflux esophagitis, moderate sized hiatal hernia   ESOPHAGOGASTRODUODENOSCOPY (EGD) WITH PROPOFOL N/A 01/18/2023   Procedure: ESOPHAGOGASTRODUODENOSCOPY (EGD) WITH PROPOFOL;  Surgeon: Lanelle Bal, DO;  Location: [facility];  Service: Endoscopy;  Laterality: N/A;  1:45 pm, asa 2   HIP SURGERY Bilateral    as a child   POLYPECTOMY  07/27/2022   Procedure: POLYPECTOMY;  Surgeon: Lanelle Bal, DO;  Location: [facility];  Service: Endoscopy;;   TOOTH EXTRACTION     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   Procedure: TOTAL HIP ARTHROPLASTY;  Surgeon: Vickki Hearing, MD;  Location: [facility];  Service: Orthopedics;  Laterality: Right;   WRIST SURGERY      Family History  Problem Relation Age of Onset   Diabetes Mother    Heart  disease Mother    Diabetes Father    Heart disease Father    Colon cancer Neg Hx     Allergies as of 06/13/2023 - Review Complete 06/13/2023  Allergen Reaction Noted   Aspirin Nausea Only 05/18/2022    Social History   Socioeconomic History   Marital status: Single    Spouse name: Not on file   Number of children: Not on file   Years of education: Not on file   Highest education level: Not on file  Occupational History   Not on file  Tobacco Use   Smoking status: Every Day    Current packs/day: 0.50    Types: Cigarettes   Smokeless tobacco: Never   Tobacco comments:    5 ciggs per day - 05/07/2023  Vaping Use   Vaping status: Never Used  Substance and Sexual Activity   Alcohol use: Yes    Comment: beer and liquor twice weekly   Drug use: No    Comment: "crack" former- last about 3 years ago.   Sexual activity: Not on file  Other Topics Concern   Not on file  Social History Narrative    Not on file   Social Determinants of Health   Financial Resource Strain: Not on file  Food Insecurity: Not on file  Transportation Needs: Not on file  Physical Activity: Not on file  Stress: Not on file  Social Connections: Not on file     Review of Systems   Gen: Denies fever, chills, anorexia. Denies fatigue, weakness, weight loss.  CV: Denies chest pain, palpitations, syncope, peripheral edema, and claudication. Resp: Denies dyspnea at rest, cough, wheezing, coughing up blood, and pleurisy. GI: See HPI Derm: Denies rash, itching, dry skin Psych: Denies depression, anxiety, memory loss, confusion. No homicidal or suicidal ideation.  Heme: Denies bruising, bleeding, and enlarged lymph nodes.   Physical Exam   BP 128/82 (BP Location: Right Arm, Patient Position: Sitting, Cuff Size: Normal)   Pulse 65   Temp 97.7 F (36.5 C) (Temporal)   Ht 5\' 11"  (1.803 m)   Wt 191 lb 6.4 oz (86.8 kg)   SpO2 97%   BMI 26.69 kg/m   General:   Alert and oriented. No distress noted. Pleasant and cooperative.  Head:  Normocephalic and atraumatic. Eyes:  Conjuctiva clear without scleral icterus. Mouth:  Oral mucosa pink and moist. Good dentition. No lesions. Lungs:  Clear to auscultation bilaterally. No wheezes, rales, or rhonchi. No distress.  Heart:  S1, S2 present without murmurs appreciated.  Abdomen:  +BS, soft, non-tender and non-distended. No rebound or guarding. No HSM or masses noted. Rectal: deferred Msk:  Symmetrical without gross deformities. Normal posture. Extremities:  Without edema. Neurologic:  Alert and  oriented x4 Psych:  Alert and cooperative. Normal mood and affect.   Assessment  David Knox is a 57 y.o. male with a history of HLD, HTN, cocaine use (sober for 3-4 years), and GERD presenting today for follow-up on diarrhea.   GERD, esophagitis/gastritis: Prior to last office visit he reported possible coffee-ground emesis.  Recently underwent EGD in March  which revealed esophagitis, gastritis, and duodenitis likely secondary to uncontrolled acid reflux.  Currently maintained on pantoprazole 40 mg twice daily and doing very well.  Denies any nausea, vomiting, or dysphagia.    Diarrhea: Prior workup with negative celiac serologies and normal TSH.  Symptoms began shortly after starting on metformin.  He was taking 1000 mg twice daily however PCP just recently recommended  reduction to 500 mg once daily (7/31).  At symptom onset he was having 4-5 bowel movements daily however his last visit he reportedly was having 1-2/day and were more formed without the need to take Imodium.  Shortly after last visit his diarrhea returned and he is back to having 4-5 per day and usually occurring quickly after meals.   PLAN   Fecal elastase  Continue Imodium 1-2 times daily.  Can take 2 mg in the morning 1 in the afternoon or scheduled prior to meals. Continue pantoprazole 40 mg twice daily.  Could consider trial of pancreatic enzymes if elastase is low normal vs cholestyramine Continue to avoid dairy Follow up 3 months.     Brooke Bonito, MSN, FNP-BC, AGACNP-BC [facility]

## 2023-06-13 ENCOUNTER — Encounter: Payer: Self-pay | Admitting: Gastroenterology

## 2023-06-13 ENCOUNTER — Ambulatory Visit (INDEPENDENT_AMBULATORY_CARE_PROVIDER_SITE_OTHER): Payer: Medicaid Other | Admitting: Gastroenterology

## 2023-06-13 VITALS — BP 128/82 | HR 65 | Temp 97.7°F | Ht 71.0 in | Wt 191.4 lb

## 2023-06-13 DIAGNOSIS — K219 Gastro-esophageal reflux disease without esophagitis: Secondary | ICD-10-CM | POA: Diagnosis not present

## 2023-06-13 DIAGNOSIS — R197 Diarrhea, unspecified: Secondary | ICD-10-CM | POA: Diagnosis not present

## 2023-06-13 NOTE — Patient Instructions (Addendum)
I am getting lab slips today for you to be able to go to the lab to pick up stool supplies.  This is to check for any pancreatic issue that could be causing your diarrhea.  Continue taking Imodium 2 mg once daily.  If you need an additional 1 mg in the afternoon you can certainly do this.  If you would like you can take 1 mg prior to each meal to see if this also helps with the diarrhea.  Please try to limit it to no more than 4-6 mg of Imodium daily.  Continue to avoid dairy products.  If at any point prior to your follow-up your diarrhea worsens please let me know.  I will be in touch with results of your labs once I receive them.  Management options to consider going forward (recommendations will depend on your stool results): -Scheduled Imodium -Cholestyramine -Pancreatic enzymes  Follow-up in 3 months, sooner if needed.  Please feel free to reach out via MyChart or by calling the office if anything worsens.  It was a pleasure to see you today. I want to create trusting relationships with patients. If you receive a survey regarding your visit,  I greatly appreciate you taking time to fill this out on paper or through your MyChart. I value your feedback.  Brooke Bonito, MSN, FNP-BC, AGACNP-BC [facility]

## 2023-06-22 ENCOUNTER — Encounter: Payer: Self-pay | Admitting: Internal Medicine

## 2023-06-22 ENCOUNTER — Ambulatory Visit (INDEPENDENT_AMBULATORY_CARE_PROVIDER_SITE_OTHER): Payer: Medicaid Other | Admitting: Internal Medicine

## 2023-06-22 VITALS — BP 118/70 | HR 72 | Ht 71.0 in | Wt 187.6 lb

## 2023-06-22 DIAGNOSIS — E782 Mixed hyperlipidemia: Secondary | ICD-10-CM | POA: Diagnosis not present

## 2023-06-22 DIAGNOSIS — R197 Diarrhea, unspecified: Secondary | ICD-10-CM

## 2023-06-22 DIAGNOSIS — Z125 Encounter for screening for malignant neoplasm of prostate: Secondary | ICD-10-CM

## 2023-06-22 DIAGNOSIS — E1140 Type 2 diabetes mellitus with diabetic neuropathy, unspecified: Secondary | ICD-10-CM | POA: Diagnosis not present

## 2023-06-22 DIAGNOSIS — I10 Essential (primary) hypertension: Secondary | ICD-10-CM

## 2023-06-22 DIAGNOSIS — E559 Vitamin D deficiency, unspecified: Secondary | ICD-10-CM

## 2023-06-22 DIAGNOSIS — Z7984 Long term (current) use of oral hypoglycemic drugs: Secondary | ICD-10-CM

## 2023-06-22 MED ORDER — GLIPIZIDE ER 2.5 MG PO TB24
2.5000 mg | ORAL_TABLET | Freq: Every day | ORAL | 3 refills | Status: DC
Start: 2023-06-22 — End: 2023-07-03

## 2023-06-22 NOTE — Assessment & Plan Note (Signed)
Ordered PSA after discussing its limitations for prostate cancer screening, including false positive results leading to additional investigations. 

## 2023-06-22 NOTE — Assessment & Plan Note (Addendum)
Lab Results  Component Value Date   HGBA1C 6.4 04/25/2023   Well controlled Likely due to oral steroid use, has stopped oral prednisone now On metformin to 1000 mg twice daily, but has chronic loose BM with it, had switched to metformin ER - now 500 mg ONCE DAILY, but still has intermittent watery diarrhea DC metformin and started glipizide 2.5 mg QD for now Advised to follow diabetic diet On statin and ARB F/u CMP and lipid panel Diabetic eye exam: Advised to follow up with Ophthalmology for diabetic eye exam  On gabapentin 300 mg twice daily for neuropathy If persistent, will refer to Neurology

## 2023-06-22 NOTE — Assessment & Plan Note (Signed)
Check lipid profile On Atorvastatin 

## 2023-06-22 NOTE — Assessment & Plan Note (Addendum)
Likely due to metformin use Discontinue metformin Advised to take Benefiber supplement and as needed Imodium for diarrhea Has been evaluated by GI

## 2023-06-22 NOTE — Patient Instructions (Addendum)
Please stop taking Metformin. Please start taking Glipizide as prescribed.  Please take Benefiber supplement once daily.  Please take Vitamin B12 500 mcg once daily.  Please continue to take other medications as prescribed.  Please continue to follow low carb diet and perform moderate exercise/walking at least 150 mins/week.  Please get fasting blood tests done before the next visit.

## 2023-06-22 NOTE — Progress Notes (Signed)
Established Patient Office Visit  Subjective:  Patient ID: David Knox, male    DOB: 08/01/1966  Age: 57 y.o. MRN: [id number]  CC:  Chief Complaint  Patient presents with   GI Problem    Two week follow up     HPI David Knox is a 57 y.o. male with past medical history of HTN, type 2 DM, peripheral neuropathy, GERD, hip arthritis and tobacco abuse who presents for f/u of DM and diarrhea.  Type II DM: He was recently diagnosed with type II DM, likely due to oral steroid use. His HbA1c was 6.4 in 06/24. He used to be in prediabetes range till 06/23, HbA1c 6.0, but it increased to 10.7 in 10/23. He was placed on metformin 1000 mg twice daily, but was recently decreased to 500 mg QD due to severe diarrhea.  His diarrhea has improved in terms of frequency, but still has watery diarrhea if he does not take Imodium.  He was seen by GI for diarrhea as well.  HTN: BP is well-controlled. Takes medications regularly.  He had cardiology evaluation for episodes of chest pain.  He was placed on metoprolol 25 mg BID by cardiologist.  He denies any recent episodes of chest pain.  Patient denies headache, dizziness, or palpitations.  He has chronic right hip pain, s/p right THA.  He takes naproxen as needed for pain.  He had steroid injection for hip pain as well, which has helped him.  He still complains of bilateral feet burning pain, but is better with gabapentin 300 mg twice daily.  He had [place] ABI screening, which showed - Normal bilateral resting ankle-brachial indices. He denies any claudication symptoms.    Past Medical History:  Diagnosis Date   Acid reflux    Diabetes mellitus without complication (HCC)    HLD (hyperlipidemia)    HTN (hypertension)     Past Surgical History:  Procedure Laterality Date   BIOPSY  01/18/2023   Procedure: BIOPSY;  Surgeon: Lanelle Bal, DO;  Location: [facility];  Service: Endoscopy;;   COLONOSCOPY WITH PROPOFOL N/A 07/27/2022    Procedure: COLONOSCOPY WITH PROPOFOL;  Surgeon: Lanelle Bal, DO;  Location: [facility];  Service: Endoscopy;  Laterality: N/A;  9:00 am  ASA 2   ESOPHAGOGASTRODUODENOSCOPY  2002   RMR: ulcerative reflux esophagitis, moderate sized hiatal hernia   ESOPHAGOGASTRODUODENOSCOPY (EGD) WITH PROPOFOL N/A 01/18/2023   Procedure: ESOPHAGOGASTRODUODENOSCOPY (EGD) WITH PROPOFOL;  Surgeon: Lanelle Bal, DO;  Location: [facility];  Service: Endoscopy;  Laterality: N/A;  1:45 pm, asa 2   HIP SURGERY Bilateral    as a child   POLYPECTOMY  07/27/2022   Procedure: POLYPECTOMY;  Surgeon: Lanelle Bal, DO;  Location: [facility];  Service: Endoscopy;;   TOOTH EXTRACTION     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   Procedure: TOTAL HIP ARTHROPLASTY;  Surgeon: Vickki Hearing, MD;  Location: [facility];  Service: Orthopedics;  Laterality: Right;   WRIST SURGERY      Family History  Problem Relation Age of Onset   Diabetes Mother    Heart disease Mother    Diabetes Father    Heart disease Father    Colon cancer Neg Hx     Social History   Socioeconomic History   Marital status: Single    Spouse name: Not on file   Number of children: Not on file   Years of education: Not on file   Highest  education level: Not on file  Occupational History   Not on file  Tobacco Use   Smoking status: Every Day    Current packs/day: 0.50    Types: Cigarettes   Smokeless tobacco: Never   Tobacco comments:    5 ciggs per day - 05/07/2023  Vaping Use   Vaping status: Never Used  Substance and Sexual Activity   Alcohol use: Yes    Comment: beer and liquor twice weekly   Drug use: No    Comment: "crack" former- last about 3 years ago.   Sexual activity: Not on file  Other Topics Concern   Not on file  Social History Narrative   Not on file   Social Determinants of Health   Financial Resource Strain: Not on file  Food Insecurity: Not on file  Transportation Needs: Not on file   Physical Activity: Not on file  Stress: Not on file  Social Connections: Not on file  Intimate Partner Violence: Not on file    Outpatient Medications Prior to Visit  Medication Sig Dispense Refill   acetaminophen (TYLENOL) 500 MG tablet Take 2 tablets (1,000 mg total) by mouth every 6 (six) hours. 30 tablet 0   albuterol (VENTOLIN HFA) 108 (90 Base) MCG/ACT inhaler Inhale 2 puffs into the lungs every 6 (six) hours as needed for wheezing or shortness of breath. 8 g 3   amLODipine (NORVASC) 5 MG tablet TAKE 1 TABLET (5 MG TOTAL) BY MOUTH DAILY. 90 tablet 1   aspirin EC 81 MG tablet Take 1 tablet (81 mg total) by mouth daily. Swallow whole. 90 tablet 3   atorvastatin (LIPITOR) 20 MG tablet TAKE 1 TABLET BY MOUTH EVERY DAY 90 tablet 1   blood glucose meter kit and supplies Dispense based on patient and insurance preference. Use up to four times daily as directed. (FOR ICD-10 E10.9, E11.9). 1 each 0   gabapentin (NEURONTIN) 300 MG capsule TAKE 1 CAPSULE BY MOUTH TWICE A DAY 60 capsule 5   latanoprost (XALATAN) 0.005 % ophthalmic solution Place 1 drop into both eyes at bedtime.     metoprolol tartrate (LOPRESSOR) 25 MG tablet TAKE 1 TABLET BY MOUTH (2) TIMES A DAY. 180 tablet 2   naproxen (NAPROSYN) 500 MG tablet TAKE 1 TABLET BY MOUTH TWICE A DAY WITH FOOD 60 tablet 2   nitroGLYCERIN (NITROSTAT) 0.4 MG SL tablet Place 1 tablet (0.4 mg total) under the tongue every 5 (five) minutes as needed for chest pain. 10 tablet 1   pantoprazole (PROTONIX) 40 MG tablet Take 1 tablet (40 mg total) by mouth 2 (two) times daily. 60 tablet 11   telmisartan (MICARDIS) 40 MG tablet TAKE 1 TABLET BY MOUTH EVERY DAY 90 tablet 1   metFORMIN (GLUCOPHAGE-XR) 500 MG 24 hr tablet Take 2 tablets (1,000 mg total) by mouth daily with breakfast. 180 tablet 1   No facility-administered medications prior to visit.    Allergies  Allergen Reactions   Aspirin Nausea Only    ROS Review of Systems  Constitutional:   Negative for chills and fever.  HENT:  Negative for congestion and sore throat.   Eyes:  Positive for visual disturbance. Negative for pain and discharge.  Respiratory:  Negative for cough and shortness of breath.   Cardiovascular:  Negative for chest pain and palpitations.  Gastrointestinal:  Positive for diarrhea. Negative for nausea and vomiting.  Endocrine: Negative for polydipsia and polyuria.  Genitourinary:  Negative for dysuria and hematuria.  Musculoskeletal:  Positive for  arthralgias and back pain. Negative for neck pain and neck stiffness.  Skin:  Negative for rash.  Neurological:  Positive for numbness (In left leg and feet). Negative for dizziness, weakness and headaches.  Psychiatric/Behavioral:  Negative for agitation and behavioral problems.       Objective:    Physical Exam Vitals reviewed.  Constitutional:      General: He is not in acute distress.    Appearance: He is not diaphoretic.  HENT:     Head: Normocephalic and atraumatic.     Nose: Nose normal.     Mouth/Throat:     Mouth: Mucous membranes are moist.  Eyes:     General: No scleral icterus.    Extraocular Movements: Extraocular movements intact.  Neck:     Vascular: No carotid bruit.  Cardiovascular:     Rate and Rhythm: Normal rate and regular rhythm.     Pulses: Normal pulses.     Heart sounds: Normal heart sounds. No murmur heard. Pulmonary:     Breath sounds: Normal breath sounds. No wheezing or rales.  Musculoskeletal:     Cervical back: Neck supple. No tenderness.     Right lower leg: No edema.     Left lower leg: No edema.  Skin:    General: Skin is warm.     Findings: No rash.  Neurological:     General: No focal deficit present.     Mental Status: He is alert and oriented to person, place, and time.     Cranial Nerves: No cranial nerve deficit.     Motor: No weakness.  Psychiatric:        Mood and Affect: Mood normal.        Behavior: Behavior normal.     BP 118/70 (BP  Location: Left Arm, Patient Position: Sitting, Cuff Size: Normal)   Pulse 72   Ht 5\' 11"  (1.803 m)   Wt 187 lb 9.6 oz (85.1 kg)   SpO2 95%   BMI 26.16 kg/m  Wt Readings from Last 3 Encounters:  06/22/23 187 lb 9.6 oz (85.1 kg)  06/13/23 191 lb 6.4 oz (86.8 kg)  06/06/23 192 lb 0.6 oz (87.1 kg)    Lab Results  Component Value Date   TSH 0.821 04/18/2022   Lab Results  Component Value Date   WBC 9.6 08/21/2022   HGB 13.5 08/21/2022   HCT 41.8 08/21/2022   MCV 87 08/21/2022   PLT 372 08/21/2022   Lab Results  Component Value Date   NA 141 11/30/2022   K 4.1 11/30/2022   CO2 22 11/30/2022   GLUCOSE 112 (H) 11/30/2022   BUN 10 11/30/2022   CREATININE 1.13 11/30/2022   BILITOT <0.2 11/30/2022   ALKPHOS 72 11/30/2022   AST 16 11/30/2022   ALT 17 11/30/2022   PROT 7.0 11/30/2022   ALBUMIN 4.2 11/30/2022   CALCIUM 9.7 11/30/2022   ANIONGAP 11 08/21/2022   EGFR 77 11/30/2022   Lab Results  Component Value Date   CHOL 160 08/21/2022   Lab Results  Component Value Date   HDL 35 (L) 08/21/2022   Lab Results  Component Value Date   LDLCALC 30 08/21/2022   Lab Results  Component Value Date   TRIG 717 (HH) 08/21/2022   Lab Results  Component Value Date   CHOLHDL 4.6 08/21/2022   Lab Results  Component Value Date   HGBA1C 6.4 04/25/2023      Assessment & Plan:   Problem List Items  Addressed This Visit       Cardiovascular and Mediastinum   HTN (hypertension) (Chronic)    BP Readings from Last 1 Encounters:  06/22/23 118/70   Well-controlled with Amlodipine 5 mg every day, Telmisartan 40  mg every day and metoprolol 25 mg twice daily Counseled for compliance with the medications Advised DASH diet and moderate exercise/walking, at least 150 mins/week      Relevant Orders   CMP14+EGFR   CBC with Differential/Platelet   TSH + free T4     Endocrine   Type 2 diabetes mellitus with diabetic neuropathy, unspecified (HCC) - Primary    Lab Results   Component Value Date   HGBA1C 6.4 04/25/2023   Well controlled Likely due to oral steroid use, has stopped oral prednisone now On metformin to 1000 mg twice daily, but has chronic loose BM with it, had switched to metformin ER - now 500 mg ONCE DAILY, but still has intermittent watery diarrhea DC metformin and started glipizide 2.5 mg QD for now Advised to follow diabetic diet On statin and ARB F/u CMP and lipid panel Diabetic eye exam: Advised to follow up with Ophthalmology for diabetic eye exam  On gabapentin 300 mg twice daily for neuropathy If persistent, will refer to Neurology      Relevant Medications   glipiZIDE (GLUCOTROL XL) 2.5 MG 24 hr tablet   Other Relevant Orders   Hemoglobin A1c   CMP14+EGFR     Other   Mixed hyperlipidemia (Chronic)    Check lipid profile On Atorvastatin      Relevant Orders   Lipid panel   Vitamin D deficiency    Last vitamin D Lab Results  Component Value Date   VD25OH 10.4 (L) 08/21/2022   Advised to take Vitamin D 5000 IU QD      Relevant Orders   VITAMIN D 25 Hydroxy (Vit-D Deficiency, Fractures)   Prostate cancer screening    Ordered PSA after discussing its limitations for prostate cancer screening, including false positive results leading to additional investigations.      Relevant Orders   PSA   Diarrhea    Likely due to metformin use Discontinue metformin Advised to take Benefiber supplement and as needed Imodium for diarrhea Has been evaluated by GI      Meds ordered this encounter  Medications   glipiZIDE (GLUCOTROL XL) 2.5 MG 24 hr tablet    Sig: Take 1 tablet (2.5 mg total) by mouth daily with breakfast.    Dispense:  30 tablet    Refill:  3    Follow-up: Return if symptoms worsen or fail to improve.    Anabel Halon, MD

## 2023-06-22 NOTE — Assessment & Plan Note (Signed)
BP Readings from Last 1 Encounters:  06/22/23 118/70   Well-controlled with Amlodipine 5 mg every day, Telmisartan 40  mg every day and metoprolol 25 mg twice daily Counseled for compliance with the medications Advised DASH diet and moderate exercise/walking, at least 150 mins/week

## 2023-06-22 NOTE — Assessment & Plan Note (Signed)
Last vitamin D Lab Results  Component Value Date   VD25OH 10.4 (L) 08/21/2022   Advised to take Vitamin D 5000 IU QD

## 2023-07-03 ENCOUNTER — Other Ambulatory Visit: Payer: Self-pay | Admitting: Internal Medicine

## 2023-07-03 DIAGNOSIS — E1140 Type 2 diabetes mellitus with diabetic neuropathy, unspecified: Secondary | ICD-10-CM

## 2023-07-22 ENCOUNTER — Emergency Department (HOSPITAL_COMMUNITY): Payer: Medicaid Other

## 2023-07-22 ENCOUNTER — Other Ambulatory Visit: Payer: Self-pay

## 2023-07-22 ENCOUNTER — Inpatient Hospital Stay (HOSPITAL_COMMUNITY)
Admission: EM | Admit: 2023-07-22 | Discharge: 2023-07-25 | DRG: 419 | Disposition: A | Discharge status: Home / Self Care | Payer: Medicaid Other | Attending: Internal Medicine | Admitting: Internal Medicine

## 2023-07-22 ENCOUNTER — Encounter (HOSPITAL_COMMUNITY): Payer: Self-pay | Admitting: *Deleted

## 2023-07-22 DIAGNOSIS — K219 Gastro-esophageal reflux disease without esophagitis: Secondary | ICD-10-CM | POA: Diagnosis present

## 2023-07-22 DIAGNOSIS — Z886 Allergy status to analgesic agent status: Secondary | ICD-10-CM

## 2023-07-22 DIAGNOSIS — F17210 Nicotine dependence, cigarettes, uncomplicated: Secondary | ICD-10-CM | POA: Diagnosis present

## 2023-07-22 DIAGNOSIS — K819 Cholecystitis, unspecified: Secondary | ICD-10-CM | POA: Diagnosis present

## 2023-07-22 DIAGNOSIS — R112 Nausea with vomiting, unspecified: Secondary | ICD-10-CM | POA: Diagnosis present

## 2023-07-22 DIAGNOSIS — K810 Acute cholecystitis: Secondary | ICD-10-CM | POA: Diagnosis present

## 2023-07-22 DIAGNOSIS — Z833 Family history of diabetes mellitus: Secondary | ICD-10-CM

## 2023-07-22 DIAGNOSIS — E785 Hyperlipidemia, unspecified: Secondary | ICD-10-CM | POA: Diagnosis present

## 2023-07-22 DIAGNOSIS — Z79899 Other long term (current) drug therapy: Secondary | ICD-10-CM | POA: Diagnosis not present

## 2023-07-22 DIAGNOSIS — E119 Type 2 diabetes mellitus without complications: Secondary | ICD-10-CM

## 2023-07-22 DIAGNOSIS — K828 Other specified diseases of gallbladder: Secondary | ICD-10-CM | POA: Diagnosis present

## 2023-07-22 DIAGNOSIS — I10 Essential (primary) hypertension: Secondary | ICD-10-CM | POA: Diagnosis present

## 2023-07-22 DIAGNOSIS — Z8249 Family history of ischemic heart disease and other diseases of the circulatory system: Secondary | ICD-10-CM

## 2023-07-22 DIAGNOSIS — Z96641 Presence of right artificial hip joint: Secondary | ICD-10-CM | POA: Diagnosis present

## 2023-07-22 DIAGNOSIS — Z720 Tobacco use: Secondary | ICD-10-CM | POA: Diagnosis not present

## 2023-07-22 DIAGNOSIS — E1142 Type 2 diabetes mellitus with diabetic polyneuropathy: Secondary | ICD-10-CM | POA: Diagnosis not present

## 2023-07-22 DIAGNOSIS — E278 Other specified disorders of adrenal gland: Secondary | ICD-10-CM | POA: Diagnosis present

## 2023-07-22 DIAGNOSIS — E1140 Type 2 diabetes mellitus with diabetic neuropathy, unspecified: Secondary | ICD-10-CM | POA: Diagnosis present

## 2023-07-22 DIAGNOSIS — R0789 Other chest pain: Secondary | ICD-10-CM | POA: Diagnosis present

## 2023-07-22 DIAGNOSIS — F1010 Alcohol abuse, uncomplicated: Secondary | ICD-10-CM | POA: Diagnosis present

## 2023-07-22 DIAGNOSIS — E279 Disorder of adrenal gland, unspecified: Secondary | ICD-10-CM | POA: Insufficient documentation

## 2023-07-22 DIAGNOSIS — Z7982 Long term (current) use of aspirin: Secondary | ICD-10-CM | POA: Diagnosis not present

## 2023-07-22 DIAGNOSIS — Z7984 Long term (current) use of oral hypoglycemic drugs: Secondary | ICD-10-CM | POA: Diagnosis not present

## 2023-07-22 LAB — CBC WITH DIFFERENTIAL/PLATELET
Abs Immature Granulocytes: 0.02 10*3/uL (ref 0.00–0.07)
Basophils Absolute: 0 10*3/uL (ref 0.0–0.1)
Basophils Relative: 0 %
Eosinophils Absolute: 0.1 10*3/uL (ref 0.0–0.5)
Eosinophils Relative: 1 %
HCT: 38.7 % — ABNORMAL LOW (ref 39.0–52.0)
Hemoglobin: 13.1 g/dL (ref 13.0–17.0)
Immature Granulocytes: 0 %
Lymphocytes Relative: 20 %
Lymphs Abs: 1.5 10*3/uL (ref 0.7–4.0)
MCH: 31.3 pg (ref 26.0–34.0)
MCHC: 33.9 g/dL (ref 30.0–36.0)
MCV: 92.4 fL (ref 80.0–100.0)
Monocytes Absolute: 1.2 10*3/uL — ABNORMAL HIGH (ref 0.1–1.0)
Monocytes Relative: 15 %
Neutro Abs: 4.9 10*3/uL (ref 1.7–7.7)
Neutrophils Relative %: 64 %
Platelets: 239 10*3/uL (ref 150–400)
RBC: 4.19 MIL/uL — ABNORMAL LOW (ref 4.22–5.81)
RDW: 13.2 % (ref 11.5–15.5)
WBC: 7.7 10*3/uL (ref 4.0–10.5)
nRBC: 0 % (ref 0.0–0.2)

## 2023-07-22 LAB — COMPREHENSIVE METABOLIC PANEL
ALT: 55 U/L — ABNORMAL HIGH (ref 0–44)
AST: 39 U/L (ref 15–41)
Albumin: 3.7 g/dL (ref 3.5–5.0)
Alkaline Phosphatase: 62 U/L (ref 38–126)
Anion gap: 9 (ref 5–15)
BUN: 12 mg/dL (ref 6–20)
CO2: 25 mmol/L (ref 22–32)
Calcium: 8.9 mg/dL (ref 8.9–10.3)
Chloride: 102 mmol/L (ref 98–111)
Creatinine, Ser: 0.94 mg/dL (ref 0.61–1.24)
GFR, Estimated: >60 mL/min (ref >60)
Glucose, Bld: 96 mg/dL (ref 70–99)
Potassium: 3.7 mmol/L (ref 3.5–5.1)
Sodium: 136 mmol/L (ref 135–145)
Total Bilirubin: 0.7 mg/dL (ref 0.3–1.2)
Total Protein: 7 g/dL (ref 6.5–8.1)

## 2023-07-22 LAB — LIPASE, BLOOD: Lipase: 27 U/L (ref 11–51)

## 2023-07-22 LAB — URINALYSIS, ROUTINE W REFLEX MICROSCOPIC
Bilirubin Urine: NEGATIVE
Glucose, UA: NEGATIVE mg/dL
Hgb urine dipstick: NEGATIVE
Ketones, ur: NEGATIVE mg/dL
Leukocytes,Ua: NEGATIVE
Nitrite: NEGATIVE
Protein, ur: NEGATIVE mg/dL
Specific Gravity, Urine: 1.023 (ref 1.005–1.030)
pH: 7 (ref 5.0–8.0)

## 2023-07-22 LAB — ETHANOL: Alcohol, Ethyl (B): <10 mg/dL (ref <10)

## 2023-07-22 LAB — CBG MONITORING, ED: Glucose-Capillary: 107 mg/dL — ABNORMAL HIGH (ref 70–99)

## 2023-07-22 LAB — GLUCOSE, CAPILLARY: Glucose-Capillary: 133 mg/dL — ABNORMAL HIGH (ref 70–99)

## 2023-07-22 MED ORDER — ONDANSETRON HCL 4 MG/2ML IJ SOLN
4.0000 mg | Freq: Once | INTRAMUSCULAR | Status: AC
  Administered 2023-07-22: 4 mg via INTRAVENOUS
  Filled 2023-07-22: qty 2

## 2023-07-22 MED ORDER — GABAPENTIN 300 MG PO CAPS
300.0000 mg | ORAL_CAPSULE | Freq: Two times a day (BID) | ORAL | Status: DC
  Administered 2023-07-22 – 2023-07-25 (×5): 300 mg via ORAL
  Filled 2023-07-22 (×5): qty 1

## 2023-07-22 MED ORDER — HYDROMORPHONE HCL 1 MG/ML IJ SOLN
0.5000 mg | INTRAMUSCULAR | Status: DC | PRN
  Administered 2023-07-22 – 2023-07-23 (×4): 0.5 mg via INTRAVENOUS
  Filled 2023-07-22 (×4): qty 0.5

## 2023-07-22 MED ORDER — ONDANSETRON HCL 4 MG PO TABS: 4.0000 mg | ORAL_TABLET | Freq: Four times a day (QID) | ORAL | Status: DC | PRN

## 2023-07-22 MED ORDER — PANTOPRAZOLE SODIUM 40 MG PO TBEC
40.0000 mg | DELAYED_RELEASE_TABLET | Freq: Two times a day (BID) | ORAL | Status: DC
  Administered 2023-07-22 – 2023-07-25 (×5): 40 mg via ORAL
  Filled 2023-07-22 (×5): qty 1

## 2023-07-22 MED ORDER — SODIUM CHLORIDE 0.9 % IV SOLN
2.0000 g | Freq: Once | INTRAVENOUS | Status: AC
  Administered 2023-07-22: 2 g via INTRAVENOUS
  Filled 2023-07-22: qty 20

## 2023-07-22 MED ORDER — ATORVASTATIN CALCIUM 20 MG PO TABS
20.0000 mg | ORAL_TABLET | Freq: Every day | ORAL | Status: DC
  Administered 2023-07-24 – 2023-07-25 (×2): 20 mg via ORAL
  Filled 2023-07-22 (×3): qty 1

## 2023-07-22 MED ORDER — FENTANYL CITRATE PF 50 MCG/ML IJ SOSY
50.0000 ug | PREFILLED_SYRINGE | Freq: Once | INTRAMUSCULAR | Status: AC
  Administered 2023-07-22: 50 ug via INTRAVENOUS
  Filled 2023-07-22: qty 1

## 2023-07-22 MED ORDER — IOHEXOL 300 MG/ML  SOLN
100.0000 mL | Freq: Once | INTRAMUSCULAR | Status: AC | PRN
  Administered 2023-07-22: 100 mL via INTRAVENOUS

## 2023-07-22 MED ORDER — INSULIN ASPART 100 UNIT/ML IJ SOLN
0.0000 [IU] | Freq: Three times a day (TID) | INTRAMUSCULAR | Status: DC
  Administered 2023-07-25 (×2): 2 [IU] via SUBCUTANEOUS

## 2023-07-22 MED ORDER — MORPHINE SULFATE (PF) 2 MG/ML IV SOLN
2.0000 mg | INTRAVENOUS | Status: DC | PRN
  Administered 2023-07-22: 2 mg via INTRAVENOUS
  Filled 2023-07-22: qty 1

## 2023-07-22 MED ORDER — OXYCODONE HCL 5 MG PO TABS
10.0000 mg | ORAL_TABLET | Freq: Once | ORAL | Status: AC
  Administered 2023-07-22: 10 mg via ORAL
  Filled 2023-07-22: qty 2

## 2023-07-22 MED ORDER — HEPARIN SODIUM (PORCINE) 5000 UNIT/ML IJ SOLN
5000.0000 [IU] | Freq: Three times a day (TID) | INTRAMUSCULAR | Status: DC
  Administered 2023-07-22 – 2023-07-25 (×8): 5000 [IU] via SUBCUTANEOUS
  Filled 2023-07-22 (×8): qty 1

## 2023-07-22 MED ORDER — IRBESARTAN 150 MG PO TABS
150.0000 mg | ORAL_TABLET | Freq: Every day | ORAL | Status: DC
  Administered 2023-07-24 – 2023-07-25 (×2): 150 mg via ORAL
  Filled 2023-07-22 (×3): qty 1

## 2023-07-22 MED ORDER — ONDANSETRON HCL 4 MG/2ML IJ SOLN
4.0000 mg | Freq: Four times a day (QID) | INTRAMUSCULAR | Status: DC | PRN
  Administered 2023-07-24 – 2023-07-25 (×2): 4 mg via INTRAVENOUS
  Filled 2023-07-22 (×2): qty 2

## 2023-07-22 MED ORDER — METOPROLOL TARTRATE 25 MG PO TABS
25.0000 mg | ORAL_TABLET | Freq: Two times a day (BID) | ORAL | Status: DC
  Administered 2023-07-22 – 2023-07-25 (×5): 25 mg via ORAL
  Filled 2023-07-22 (×6): qty 1

## 2023-07-22 MED ORDER — METRONIDAZOLE 500 MG/100ML IV SOLN
500.0000 mg | Freq: Once | INTRAVENOUS | Status: AC
  Administered 2023-07-22: 500 mg via INTRAVENOUS
  Filled 2023-07-22: qty 100

## 2023-07-22 MED ORDER — AMLODIPINE BESYLATE 5 MG PO TABS
5.0000 mg | ORAL_TABLET | Freq: Every day | ORAL | Status: DC
  Administered 2023-07-24 – 2023-07-25 (×2): 5 mg via ORAL
  Filled 2023-07-22 (×2): qty 1

## 2023-07-22 NOTE — ED Provider Notes (Signed)
[place] EMERGENCY DEPARTMENT AT [facility] Provider Note   CSN: [id number] Arrival date & time: 07/22/23  1133     History  Chief Complaint  Patient presents with   Back Pain    ESTAL GELO is a 57 y.o. male.   Back Pain Associated symptoms: abdominal pain and chest pain (Right lateral chest wall pain)   Associated symptoms: no fever, no numbness and no weakness        NIKESH BUSBY is a 57 y.o. male with past medical history of acid reflux, hypertension, type 2 diabetes and current alcohol user, who presents to the Emergency Department complaining of persistent right flank, right upper quadrant and right anterior chest wall pain.  Symptoms present for 5 days.  Endorses 1 episode of vomiting last evening.  He has also had several episodes of loose stools that have been nonbloody or black.  Describes constant aching pain to this area.  Pain is nonradiating.  He endorses frequent alcohol use, but states alcohol intake does not make the pain worse, nor does food intake.  He denies known injury, pain of his left chest, cough, recent illness or shortness of breath.  No fever or chills.  No history of abdominal surgeries  Home Medications Prior to Admission medications   Medication Sig Start Date End Date Taking? Authorizing Provider  acetaminophen (TYLENOL) 500 MG tablet Take 2 tablets (1,000 mg total) by mouth every 6 (six) hours. 05/17/22   Vickki Hearing, MD  albuterol (VENTOLIN HFA) 108 (90 Base) MCG/ACT inhaler Inhale 2 puffs into the lungs every 6 (six) hours as needed for wheezing or shortness of breath. 12/05/22   Anabel Halon, MD  amLODipine (NORVASC) 5 MG tablet TAKE 1 TABLET (5 MG TOTAL) BY MOUTH DAILY. 05/21/23   Anabel Halon, MD  aspirin EC 81 MG tablet Take 1 tablet (81 mg total) by mouth daily. Swallow whole. 02/22/23   Mallipeddi, Vishnu P, MD  atorvastatin (LIPITOR) 20 MG tablet TAKE 1 TABLET BY MOUTH EVERY DAY 03/29/23   Anabel Halon,  MD  blood glucose meter kit and supplies Dispense based on patient and insurance preference. Use up to four times daily as directed. (FOR ICD-10 E10.9, E11.9). 08/21/22   Gardenia Phlegm, MD  gabapentin (NEURONTIN) 300 MG capsule TAKE 1 CAPSULE BY MOUTH TWICE A DAY 01/05/23   Patel, Earlie Lou, MD  glipiZIDE (GLUCOTROL XL) 2.5 MG 24 hr tablet TAKE 1 TABLET BY MOUTH DAILY WITH BREAKFAST. 07/03/23   Patel, Earlie Lou, MD  latanoprost (XALATAN) 0.005 % ophthalmic solution Place 1 drop into both eyes at bedtime.    [provider]  metFORMIN (GLUCOPHAGE) 1000 MG tablet TAKE 1 TABLET BY MOUTH TWICE A DAY WITH MEALS 07/03/23   Anabel Halon, MD  metoprolol tartrate (LOPRESSOR) 25 MG tablet TAKE 1 TABLET BY MOUTH (2) TIMES A DAY. 04/23/23   Mallipeddi, Vishnu P, MD  naproxen (NAPROSYN) 500 MG tablet TAKE 1 TABLET BY MOUTH TWICE A DAY WITH FOOD 12/11/22   Vickki Hearing, MD  nitroGLYCERIN (NITROSTAT) 0.4 MG SL tablet Place 1 tablet (0.4 mg total) under the tongue every 5 (five) minutes as needed for chest pain. 12/05/22   Anabel Halon, MD  pantoprazole (PROTONIX) 40 MG tablet Take 1 tablet (40 mg total) by mouth 2 (two) times daily. 01/18/23 01/18/24  Lanelle Bal, DO  telmisartan (MICARDIS) 40 MG tablet TAKE 1 TABLET BY MOUTH EVERY DAY 01/18/23  Anabel Halon, MD      Allergies    Aspirin    Review of Systems   Review of Systems  Constitutional:  Negative for appetite change, chills and fever.  Respiratory:  Negative for cough and shortness of breath.   Cardiovascular:  Positive for chest pain (Right lateral chest wall pain).  Gastrointestinal:  Positive for abdominal pain, diarrhea, nausea and vomiting. Negative for blood in stool.  Genitourinary:  Negative for difficulty urinating, flank pain and frequency.  Musculoskeletal:  Positive for back pain.  Skin:  Negative for rash.  Neurological:  Negative for weakness and numbness.    Physical Exam Updated Vital Signs BP (!) 135/91    Pulse 66   Temp 97.9 F (36.6 C)   Resp 19   Ht 5\' 11"  (1.803 m)   Wt 89.8 kg   SpO2 98%   BMI 27.62 kg/m  Physical Exam Vitals and nursing note reviewed.  Constitutional:      General: He is not in acute distress.    Appearance: Normal appearance. He is not toxic-appearing.  HENT:     Mouth/Throat:     Mouth: Mucous membranes are moist.  Cardiovascular:     Rate and Rhythm: Normal rate and regular rhythm.     Pulses: Normal pulses.  Pulmonary:     Effort: Pulmonary effort is normal.  Chest:     Chest wall: No tenderness.  Abdominal:     Palpations: Abdomen is soft.     Tenderness: There is abdominal tenderness. There is no right CVA tenderness, left CVA tenderness or guarding.     Comments: Right upper quadrant tenderness to palpation.  Mild guarding noted.  Abdomen is soft, no distention, remaining abdomen is soft nontender  Musculoskeletal:        General: Normal range of motion.  Skin:    General: Skin is warm.     Capillary Refill: Capillary refill takes less than 2 seconds.  Neurological:     General: No focal deficit present.     Mental Status: He is alert.     Sensory: No sensory deficit.     Motor: No weakness.     ED Results / Procedures / Treatments   Labs (all labs ordered are listed, but only abnormal results are displayed) Labs Reviewed  CBC WITH DIFFERENTIAL/PLATELET - Abnormal; Notable for the following components:      Result Value   RBC 4.19 (*)    HCT 38.7 (*)    Monocytes Absolute 1.2 (*)    All other components within normal limits  COMPREHENSIVE METABOLIC PANEL - Abnormal; Notable for the following components:   ALT 55 (*)    All other components within normal limits  URINALYSIS, ROUTINE W REFLEX MICROSCOPIC  LIPASE, BLOOD  ETHANOL    EKG None  Radiology DG Chest 2 View  Result Date: 07/22/2023 CLINICAL DATA:  Right chest pain. EXAM: CHEST - 2 VIEW COMPARISON:  Chest radiograph dated 05/18/2022. FINDINGS: The heart size and  mediastinal contours are within normal limits. Both lungs are clear. The visualized skeletal structures are unremarkable. IMPRESSION: No active cardiopulmonary disease. Electronically Signed   By: Romona Curls M.D.   On: 07/22/2023 14:15   CT ABDOMEN PELVIS W CONTRAST  Result Date: 07/22/2023 CLINICAL DATA:  RUQ pain, vomiting. EXAM: CT ABDOMEN AND PELVIS WITH CONTRAST TECHNIQUE: Multidetector CT imaging of the abdomen and pelvis was performed using the standard protocol following bolus administration of intravenous contrast. RADIATION DOSE REDUCTION: This  exam was performed according to the departmental dose-optimization program which includes automated exposure control, adjustment of the mA and/or kV according to patient size and/or use of iterative reconstruction technique. CONTRAST:  OMNIPAQUE IOHEXOL 300 MG/ML  SOLN COMPARISON:  06/28/2005. FINDINGS: Lower chest: Linear subsegmental atelectasis left base. No pleural or pericardial effusion. Small hiatal hernia. Hepatobiliary: Tiny subcentimeter cyst right lobe of the liver. No biliary ductal dilatation. No enhancing parenchymal lesions. The gallbladder is abnormal with calcified gallstones, wall thickening or pericholecystic fluid. Findings are concerning for cholecystitis. Consider HIDA scan for further evaluation if indicated. Pancreas: Unremarkable. No pancreatic ductal dilatation or surrounding inflammatory changes. Spleen: Normal in size without focal abnormality. Adrenals/Urinary Tract: There is an intermediate density 1.8 cm nodule in the right adrenal gland and a 9 mm similar nodule in the left adrenal gland. These are indeterminate based on density measurements on a contrast-enhanced study. Consider noncontrast MRI for further evaluation. There is a 2 cm cyst in the left kidney. No hydronephrosis or nephrolithiasis. Stomach/Bowel: Stomach is within normal limits. Appendix appears normal. No evidence of bowel wall thickening, distention, or  inflammatory changes. Vascular/Lymphatic: Aortic atherosclerosis. No enlarged abdominal or pelvic lymph nodes. Reproductive: Prostate is unremarkable. Other: No abdominal wall hernia or abnormality. No abdominopelvic ascites. Musculoskeletal: No acute or significant osseous findings. Right hip prosthesis with artifact that degrades images of the pelvis. IMPRESSION: 1. Abnormal gallbladder with stones, wall thickening and pericholecystic fluid suggesting cholecystitis. This can be confirmed with a HIDA scan, if indicated. 2. Bilateral adrenal nodules that are indeterminate. Nonemergent noncontrast MRI can be done for further evaluation of this finding. 3. Tiny cysts in the liver. 4. Left kidney cyst. 5. Small hiatal hernia. Electronically Signed   By: Layla Maw M.D.   On: 07/22/2023 14:12    Procedures Procedures    Medications Ordered in ED Medications  cefTRIAXone (ROCEPHIN) 2 g in sodium chloride 0.9 % 100 mL IVPB (2 g Intravenous New Bag/Given 07/22/23 1503)  ondansetron (ZOFRAN) injection 4 mg (4 mg Intravenous Given 07/22/23 1309)  fentaNYL (SUBLIMAZE) injection 50 mcg (50 mcg Intravenous Given 07/22/23 1306)  iohexol (OMNIPAQUE) 300 MG/ML solution 100 mL (100 mLs Intravenous Contrast Given 07/22/23 1347)  fentaNYL (SUBLIMAZE) injection 50 mcg (50 mcg Intravenous Given 07/22/23 1445)    ED Course/ Medical Decision Making/ A&P                                 Medical Decision Making Patient here with 5-day history of lateral right chest wall pain and right upper abdominal pain on exam.   vomiting with diarrhea.  Admits to frequent alcohol use.  No withdrawal symptoms.  No Fever chills left chest pain or shortness of breath.  Exam findings are concerning for gallbladder issue.  Differential would also include musculoskeletal injury, rib fracture, pneumonia, PE.  ACS also considered but felt less likely  Amount and/or Complexity of Data Reviewed Labs: ordered.    Details: Labs overall  reassuring.  No leukocytosis, chemistries without derangement.  Lipase unremarkable. Radiology: ordered.    Details: CT abdomen and pelvis shows abnormal gallbladder with stones, wall thickening and pericholecystic fluid suggesting cholecystitis ECG/medicine tests: ordered.    Details: EKG shows sinus rhythm Discussion of management or test interpretation with external provider(s): Patient here with exam findings and CT imaging suggestive of cholecystitis.  He is afebrile and his labs are reassuring.  Unfortunately ultrasound unavailable at this time.  Will discuss findings with surgery   Consulted with general surgery, Dr. Henreitta Leber who request hospitalist admit, n.p.o. after midnight, may have clears for now she will see in consultation tomorrow with plan for cholecystectomy  Discussed findings with [facility] hospitalist, Dr. Thomes Dinning who agrees to admit  Risk Prescription drug management.           Final Clinical Impression(s) / ED Diagnoses Final diagnoses:  Cholecystitis, acute    Rx / DC Orders ED Discharge Orders     None         Pauline Aus, PA-C 07/22/23 1547    Benjiman Core, MD 07/23/23 1545

## 2023-07-22 NOTE — ED Notes (Signed)
EDP at bedside  

## 2023-07-22 NOTE — ED Notes (Signed)
Pt being transported to floor by w/c.

## 2023-07-22 NOTE — ED Triage Notes (Signed)
Pt with right lower back and around to rib cage x 5 days. Denies any falls or injuries.  Emesis x 1 last night.

## 2023-07-22 NOTE — H&P (Addendum)
History and Physical    Patient: David Knox EXB:[id number] DOB: 14-Sep-1966 DOA: 07/22/2023 DOS: the patient was seen and examined on 07/22/2023 PCP: Anabel Halon, MD  Patient coming from: Home  Chief Complaint:  Chief Complaint  Patient presents with   Back Pain   HPI: David Knox is a 57 y.o. male with medical history significant of T2DM, hypertension, hyperlipidemia, GERD, alcohol user who presents to the emergency department due to 5-day onset of right upper abdominal pain, right flank and right anterior chest wall pain which was associated with an episode of nonbilious, nonbloody vomiting yesterday in the evening and several episodes of nonbloody loose stools.  Abdominal pain was constant, nonradiating and achy in nature.  Pain was not aggravated by food or alcohol (consumes alcohol daily and denies history of alcohol withdrawal).  Patient denies fever, chills, headache, blurry vision, palpitations or any sick contact.  ED Course:  In the emergency department, BP was 137/22, other vital signs were within normal range.  Workup in the ED showed normal CBC except for hematocrit of 30.7.  BMP was normal, ALT was 55, lipase 27, alcohol level was less than 10, urinalysis was normal. CT abdomen and pelvis with contrast showed abnormal gallbladder with stones, wall thickening and pericholecystic fluid suggesting cholecystitis. Chest x-ray showed no active cardiopulmonary disease Patient was treated with IV fentanyl 50 mcg x 2 for abdominal pain, IV ceftriaxone and Flagyl were given.  General surgery (Dr. Henreitta Leber) was consulted and recommended admitting patient with plan to take patient to the OR in the morning. Hospitalist was asked to admit patient for further evaluation and management.  Review of Systems: Review of systems as noted in the HPI. All other systems reviewed and are negative.   Past Medical History:  Diagnosis Date   Acid reflux    Diabetes mellitus without  complication (HCC)    HLD (hyperlipidemia)    HTN (hypertension)    Past Surgical History:  Procedure Laterality Date   BIOPSY  01/18/2023   Procedure: BIOPSY;  Surgeon: Lanelle Bal, DO;  [facility];  Service: Endoscopy;;   COLONOSCOPY WITH PROPOFOL N/A 07/27/2022   Procedure: COLONOSCOPY WITH PROPOFOL;  Surgeon: Lanelle Bal, DO;  [facility];  Service: Endoscopy;  Laterality: N/A;  9:00 am  ASA 2   ESOPHAGOGASTRODUODENOSCOPY  2002   RMR: ulcerative reflux esophagitis, moderate sized hiatal hernia   ESOPHAGOGASTRODUODENOSCOPY (EGD) WITH PROPOFOL N/A 01/18/2023   Procedure: ESOPHAGOGASTRODUODENOSCOPY (EGD) WITH PROPOFOL;  Surgeon: Lanelle Bal, DO;  [facility];  Service: Endoscopy;  Laterality: N/A;  1:45 pm, asa 2   HIP SURGERY Bilateral    as a child   POLYPECTOMY  07/27/2022   Procedure: POLYPECTOMY;  Surgeon: Lanelle Bal, DO;  [facility];  Service: Endoscopy;;   TOOTH EXTRACTION     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   Procedure: TOTAL HIP ARTHROPLASTY;  Surgeon: Vickki Hearing, MD;  Location: [facility];  Service: Orthopedics;  Laterality: Right;   WRIST SURGERY      Social History:  reports that he has been smoking cigarettes. He has never used smokeless tobacco. He reports current alcohol use. He reports that he does not use drugs.   Allergies  Allergen Reactions   Asa [Aspirin] Nausea Only    Family History  Problem Relation Age of Onset   Diabetes Mother    Heart disease Mother    Diabetes Father  Heart disease Father    Colon cancer Neg Hx      Prior to Admission medications   Medication Sig Start Date End Date Taking? Authorizing Provider  acetaminophen (TYLENOL) 500 MG tablet Take 2 tablets (1,000 mg total) by mouth every 6 (six) hours. 05/17/22   Vickki Hearing, MD  albuterol (VENTOLIN HFA) 108 (90 Base) MCG/ACT inhaler Inhale 2 puffs into the lungs every 6 (six) hours as needed  for wheezing or shortness of breath. 12/05/22   Anabel Halon, MD  amLODipine (NORVASC) 5 MG tablet TAKE 1 TABLET (5 MG TOTAL) BY MOUTH DAILY. 05/21/23   Anabel Halon, MD  aspirin EC 81 MG tablet Take 1 tablet (81 mg total) by mouth daily. Swallow whole. 02/22/23   Mallipeddi, Vishnu P, MD  atorvastatin (LIPITOR) 20 MG tablet TAKE 1 TABLET BY MOUTH EVERY DAY 03/29/23   Anabel Halon, MD  blood glucose meter kit and supplies Dispense based on patient and insurance preference. Use up to four times daily as directed. (FOR ICD-10 E10.9, E11.9). 08/21/22   Gardenia Phlegm, MD  gabapentin (NEURONTIN) 300 MG capsule TAKE 1 CAPSULE BY MOUTH TWICE A DAY 01/05/23   Patel, Earlie Lou, MD  glipiZIDE (GLUCOTROL XL) 2.5 MG 24 hr tablet TAKE 1 TABLET BY MOUTH DAILY WITH BREAKFAST. 07/03/23   Patel, Earlie Lou, MD  latanoprost (XALATAN) 0.005 % ophthalmic solution Place 1 drop into both eyes at bedtime.    [provider]  metFORMIN (GLUCOPHAGE) 1000 MG tablet TAKE 1 TABLET BY MOUTH TWICE A DAY WITH MEALS 07/03/23   Anabel Halon, MD  metoprolol tartrate (LOPRESSOR) 25 MG tablet TAKE 1 TABLET BY MOUTH (2) TIMES A DAY. 04/23/23   Mallipeddi, Vishnu P, MD  naproxen (NAPROSYN) 500 MG tablet TAKE 1 TABLET BY MOUTH TWICE A DAY WITH FOOD 12/11/22   Vickki Hearing, MD  nitroGLYCERIN (NITROSTAT) 0.4 MG SL tablet Place 1 tablet (0.4 mg total) under the tongue every 5 (five) minutes as needed for chest pain. 12/05/22   Anabel Halon, MD  pantoprazole (PROTONIX) 40 MG tablet Take 1 tablet (40 mg total) by mouth 2 (two) times daily. 01/18/23 01/18/24  Lanelle Bal, DO  telmisartan (MICARDIS) 40 MG tablet TAKE 1 TABLET BY MOUTH EVERY DAY 01/18/23   Anabel Halon, MD    Physical Exam: BP (!) 156/96   Pulse 64   Temp 97.6 F (36.4 C)   Resp 20   Ht 5\' 11"  (1.803 m)   Wt 89.8 kg   SpO2 99%   BMI 27.62 kg/m   General: 57 y.o. year-old male well developed well nourished in no acute distress.  Alert and  oriented x3. HEENT: NCAT, EOMI Neck: Supple, trachea medial Cardiovascular: Regular rate and rhythm with no rubs or gallops.  No thyromegaly or JVD noted.  No lower extremity edema. 2/4 pulses in all 4 extremities. Respiratory: Clear to auscultation with no wheezes or rales. Good inspiratory effort. Abdomen: Soft, nontender nondistended with normal bowel sounds x4 quadrants. Muskuloskeletal: No cyanosis, clubbing or edema noted bilaterally Neuro: CN II-XII intact, strength 5/5 x 4, sensation, reflexes intact Skin: No ulcerative lesions noted or rashes Psychiatry: Judgement and insight appear normal. Mood is appropriate for condition and setting          Labs on Admission:  Basic Metabolic Panel: Recent Labs  Lab 07/22/23 1311  NA 136  K 3.7  CL 102  CO2 25  GLUCOSE 96  BUN  12  CREATININE 0.94  CALCIUM 8.9   Liver Function Tests: Recent Labs  Lab 07/22/23 1311  AST 39  ALT 55*  ALKPHOS 62  BILITOT 0.7  PROT 7.0  ALBUMIN 3.7   Recent Labs  Lab 07/22/23 1311  LIPASE 27   No results for input(s): "AMMONIA" in the last 168 hours. CBC: Recent Labs  Lab 07/22/23 1311  WBC 7.7  NEUTROABS 4.9  HGB 13.1  HCT 38.7*  MCV 92.4  PLT 239   Cardiac Enzymes: No results for input(s): "CKTOTAL", "CKMB", "CKMBINDEX", "TROPONINI" in the last 168 hours.  BNP (last 3 results) No results for input(s): "BNP" in the last 8760 hours.  ProBNP (last 3 results) No results for input(s): "PROBNP" in the last 8760 hours.  CBG: Recent Labs  Lab 07/22/23 1658  GLUCAP 107*    Radiological Exams on Admission: DG Chest 2 View  Result Date: 07/22/2023 CLINICAL DATA:  Right chest pain. EXAM: CHEST - 2 VIEW COMPARISON:  Chest radiograph dated 05/18/2022. FINDINGS: The heart size and mediastinal contours are within normal limits. Both lungs are clear. The visualized skeletal structures are unremarkable. IMPRESSION: No active cardiopulmonary disease. Electronically Signed   By: Romona Curls M.D.   On: 07/22/2023 14:15   CT ABDOMEN PELVIS W CONTRAST  Result Date: 07/22/2023 CLINICAL DATA:  RUQ pain, vomiting. EXAM: CT ABDOMEN AND PELVIS WITH CONTRAST TECHNIQUE: Multidetector CT imaging of the abdomen and pelvis was performed using the standard protocol following bolus administration of intravenous contrast. RADIATION DOSE REDUCTION: This exam was performed according to the departmental dose-optimization program which includes automated exposure control, adjustment of the mA and/or kV according to patient size and/or use of iterative reconstruction technique. CONTRAST:  OMNIPAQUE IOHEXOL 300 MG/ML  SOLN COMPARISON:  06/28/2005. FINDINGS: Lower chest: Linear subsegmental atelectasis left base. No pleural or pericardial effusion. Small hiatal hernia. Hepatobiliary: Tiny subcentimeter cyst right lobe of the liver. No biliary ductal dilatation. No enhancing parenchymal lesions. The gallbladder is abnormal with calcified gallstones, wall thickening or pericholecystic fluid. Findings are concerning for cholecystitis. Consider HIDA scan for further evaluation if indicated. Pancreas: Unremarkable. No pancreatic ductal dilatation or surrounding inflammatory changes. Spleen: Normal in size without focal abnormality. Adrenals/Urinary Tract: There is an intermediate density 1.8 cm nodule in the right adrenal gland and a 9 mm similar nodule in the left adrenal gland. These are indeterminate based on density measurements on a contrast-enhanced study. Consider noncontrast MRI for further evaluation. There is a 2 cm cyst in the left kidney. No hydronephrosis or nephrolithiasis. Stomach/Bowel: Stomach is within normal limits. Appendix appears normal. No evidence of bowel wall thickening, distention, or inflammatory changes. Vascular/Lymphatic: Aortic atherosclerosis. No enlarged abdominal or pelvic lymph nodes. Reproductive: Prostate is unremarkable. Other: No abdominal wall hernia or abnormality. No  abdominopelvic ascites. Musculoskeletal: No acute or significant osseous findings. Right hip prosthesis with artifact that degrades images of the pelvis. IMPRESSION: 1. Abnormal gallbladder with stones, wall thickening and pericholecystic fluid suggesting cholecystitis. This can be confirmed with a HIDA scan, if indicated. 2. Bilateral adrenal nodules that are indeterminate. Nonemergent noncontrast MRI can be done for further evaluation of this finding. 3. Tiny cysts in the liver. 4. Left kidney cyst. 5. Small hiatal hernia. Electronically Signed   By: Layla Maw M.D.   On: 07/22/2023 14:12    EKG: I independently viewed the EKG done and my findings are as followed: Normal sinus rhythm at a rate of 60 bpm  Assessment/Plan Present on Admission:  Cholecystitis  Essential hypertension  GERD (gastroesophageal reflux disease)  Type 2 diabetes mellitus with diabetic neuropathy, unspecified (HCC)  Alcohol abuse  Tobacco abuse  Principal Problem:   Cholecystitis Active Problems:   Alcohol abuse   Tobacco abuse   Essential hypertension   GERD (gastroesophageal reflux disease)   Type 2 diabetes mellitus with diabetic neuropathy, unspecified (HCC)   Adrenal nodule (HCC)   Type 2 diabetes mellitus (HCC)  Acute cholecystitis CT abdomen and pelvis was suggestive of cholecystitis Continue IV NS at 100 mLs/Hr She was started on IV ceftriaxone and Flagyl, we shall continue same at this time Continue IV Dilaudid 0.5 mg q.3h p.r.n. for moderate to severe pain Continue IV Zofran p.r.n. Continue clear liquid diet at this time and place patient n.p.o. at midnight General Surgery  was consulted and recommended admitting patient with plan to take patient to the OR in the morning  Bilateral adrenal nodules CT abdomen and pelvis showed bilateral adrenal nodules that are indeterminate.  Nonemergent noncontrast MRI can be done for further evaluation of this finding per radiologist  recommendation  Essential hypertension Continue amlodipine, Lopressor, Avapro  T2DM Hemoglobin A1c on 04/25/2023 was 6.4 Continue ISS and hypoglycemic protocol Metformin and glipizide will be held at this time  GERD Continue Protonix  Peripheral neuropathy Continue gabapentin  Alcohol abuse Alcohol level was less than 10 Patient denies any history of alcohol withdrawal symptoms Patient was counseled on alcohol abuse cessation  Tobacco abuse Patient was counseled on tobacco abuse cessation  DVT prophylaxis: Heparin subcu  Advance Care Planning: CODE STATUS: Full code  Consults: General Surgery (By ED PA)  Family Communication: None at bedside  Severity of Illness: The appropriate patient status for this patient is INPATIENT. Inpatient status is judged to be reasonable and necessary in order to provide the required intensity of service to ensure the patient's safety. The patient's presenting symptoms, physical exam findings, and initial radiographic and laboratory data in the context of their chronic comorbidities is felt to place them at high risk for further clinical deterioration. Furthermore, it is not anticipated that the patient will be medically stable for discharge from the hospital within 2 midnights of admission.   * I certify that at the point of admission it is my clinical judgment that the patient will require inpatient hospital care spanning beyond 2 midnights from the point of admission due to high intensity of service, high risk for further deterioration and high frequency of surveillance required.*  Author: Frankey Shown, DO 07/22/2023 6:58 PM  For on call review [email or web address].

## 2023-07-22 NOTE — Progress Notes (Addendum)
[facility]  Reviewed imaging, CT with distended gallbladder with thickening, fluid and stones. Concerning for cholecystitis. Reported to be very tender.  Admit to hospitalist given co-morbidities  Clear diet Antibiotics Will plan for OR tomorrow Robotic assisted lap cholecystectomy  See tomorrow and discuss surgery   Algis Greenhouse, MD [facility] [address] [place], [place] [address] [phone] (office)

## 2023-07-22 NOTE — Progress Notes (Signed)
Alert and oriented, ambulatory.  C/O flank pain rated a 9 and had received dilaudid in ED.  Stated last drink was yesterday and had 5 shots of vodka and only drinks 3 x week.

## 2023-07-22 NOTE — ED Notes (Signed)
Pt to CT via stretcher, alert, NAD, calm. No changes.

## 2023-07-22 NOTE — ED Notes (Signed)
ED TO INPATIENT HANDOFF REPORT  ED Nurse Name and Phone #: [phone]  S Name/Age/Gender David Knox 57 y.o. male Room/Bed: APA14/APA14  Code Status   Code Status: Prior  Home/SNF/Other Home Patient oriented to: self, place, time, and situation Is this baseline? Yes   Triage Complete: Triage complete  Chief Complaint Cholecystitis [K81.9]  Triage Note Pt with right lower back and around to rib cage x 5 days. Denies any falls or injuries.  Emesis x 1 last night.    Allergies Allergies  Allergen Reactions   Aspirin Nausea Only    Level of Care/Admitting Diagnosis ED Disposition     ED Disposition  Admit   Condition  --   Comment  Hospital Area: [facility] [[id number]]  Level of Care: Med-Surg [16]  Covid Evaluation: Asymptomatic - no recent exposure (last 10 days) testing not required  Diagnosis: Cholecystitis [295284]  Admitting Physician: Frankey Shown [[id number]]  Attending Physician: Frankey Shown [[id number]]  Certification:: I certify this patient will need inpatient services for at least 2 midnights  Expected Medical Readiness: 07/25/2023          B Medical/Surgery History Past Medical History:  Diagnosis Date   Acid reflux    Diabetes mellitus without complication (HCC)    HLD (hyperlipidemia)    HTN (hypertension)    Past Surgical History:  Procedure Laterality Date   BIOPSY  01/18/2023   Procedure: BIOPSY;  Surgeon: Lanelle Bal, DO;  [facility];  Service: Endoscopy;;   COLONOSCOPY WITH PROPOFOL N/A 07/27/2022   Procedure: COLONOSCOPY WITH PROPOFOL;  Surgeon: Lanelle Bal, DO;  [facility];  Service: Endoscopy;  Laterality: N/A;  9:00 am  ASA 2   ESOPHAGOGASTRODUODENOSCOPY  2002   RMR: ulcerative reflux esophagitis, moderate sized hiatal hernia   ESOPHAGOGASTRODUODENOSCOPY (EGD) WITH PROPOFOL N/A 01/18/2023   Procedure: ESOPHAGOGASTRODUODENOSCOPY (EGD) WITH PROPOFOL;  Surgeon: Lanelle Bal,  DO;  [facility];  Service: Endoscopy;  Laterality: N/A;  1:45 pm, asa 2   HIP SURGERY Bilateral    as a child   POLYPECTOMY  07/27/2022   Procedure: POLYPECTOMY;  Surgeon: Lanelle Bal, DO;  [facility];  Service: Endoscopy;;   TOOTH EXTRACTION     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   Procedure: TOTAL HIP ARTHROPLASTY;  Surgeon: Vickki Hearing, MD;  Location: [facility];  Service: Orthopedics;  Laterality: Right;   WRIST SURGERY       A IV Location/Drains/Wounds Patient Lines/Drains/Airways Status     Active Line/Drains/Airways     Name Placement date Placement time Site Days   Peripheral IV 07/22/23 22 G Right Antecubital 07/22/23  1300  Antecubital  less than 1            Intake/Output Last 24 hours No intake or output data in the 24 hours ending 07/22/23 1547  Labs/Imaging Results for orders placed or performed during the hospital encounter of 07/22/23 (from the past 48 hour(s))  CBC with Differential     Status: Abnormal   Collection Time: 07/22/23  1:11 PM  Result Value Ref Range   WBC 7.7 4.0 - 10.5 K/uL   RBC 4.19 (L) 4.22 - 5.81 MIL/uL   Hemoglobin 13.1 13.0 - 17.0 g/dL   HCT 64.4 (L) 03.4 - 74.2 %   MCV 92.4 80.0 - 100.0 fL   MCH 31.3 26.0 - 34.0 pg   MCHC 33.9 30.0 - 36.0 g/dL   RDW 59.5 63.8 -  15.5 %   Platelets 239 150 - 400 K/uL   nRBC 0.0 0.0 - 0.2 %   Neutrophils Relative % 64 %   Neutro Abs 4.9 1.7 - 7.7 K/uL   Lymphocytes Relative 20 %   Lymphs Abs 1.5 0.7 - 4.0 K/uL   Monocytes Relative 15 %   Monocytes Absolute 1.2 (H) 0.1 - 1.0 K/uL   Eosinophils Relative 1 %   Eosinophils Absolute 0.1 0.0 - 0.5 K/uL   Basophils Relative 0 %   Basophils Absolute 0.0 0.0 - 0.1 K/uL   Immature Granulocytes 0 %   Abs Immature Granulocytes 0.02 0.00 - 0.07 K/uL    Comment: Performed at [facility], [address], [place], [place] [address]  Comprehensive metabolic panel     Status: Abnormal   Collection Time: 07/22/23  1:11  PM  Result Value Ref Range   Sodium 136 135 - 145 mmol/L   Potassium 3.7 3.5 - 5.1 mmol/L   Chloride 102 98 - 111 mmol/L   CO2 25 22 - 32 mmol/L   Glucose, Bld 96 70 - 99 mg/dL    Comment: Glucose reference range applies only to samples taken after fasting for at least 8 hours.   BUN 12 6 - 20 mg/dL   Creatinine, Ser 2.35 0.61 - 1.24 mg/dL   Calcium 8.9 8.9 - 57.3 mg/dL   Total Protein 7.0 6.5 - 8.1 g/dL   Albumin 3.7 3.5 - 5.0 g/dL   AST 39 15 - 41 U/L   ALT 55 (H) 0 - 44 U/L   Alkaline Phosphatase 62 38 - 126 U/L   Total Bilirubin 0.7 0.3 - 1.2 mg/dL   GFR, Estimated >22 >02 mL/min    Comment: (NOTE) Calculated using the CKD-EPI Creatinine Equation (2021)    Anion gap 9 5 - 15    Comment: Performed at [facility], [address] [place], [place] [address]  Lipase, blood     Status: None   Collection Time: 07/22/23  1:11 PM  Result Value Ref Range   Lipase 27 11 - 51 U/L    Comment: Performed at [facility], [address] [place], [place] [address]  Ethanol     Status: None   Collection Time: 07/22/23  1:11 PM  Result Value Ref Range   Alcohol, Ethyl (B) <10 <10 mg/dL    Comment: (NOTE) Lowest detectable limit for serum alcohol is 10 mg/dL.  For medical purposes only. Performed at [facility], [address], [place], [place] [address]   Urinalysis, Routine w reflex microscopic -Urine, Clean Catch     Status: None   Collection Time: 07/22/23  2:30 PM  Result Value Ref Range   Color, Urine YELLOW YELLOW   APPearance CLEAR CLEAR   Specific Gravity, Urine 1.023 1.005 - 1.030   pH 7.0 5.0 - 8.0   Glucose, UA NEGATIVE NEGATIVE mg/dL   Hgb urine dipstick NEGATIVE NEGATIVE   Bilirubin Urine NEGATIVE NEGATIVE   Ketones, ur NEGATIVE NEGATIVE mg/dL   Protein, ur NEGATIVE NEGATIVE mg/dL   Nitrite NEGATIVE NEGATIVE   Leukocytes,Ua NEGATIVE NEGATIVE    Comment: Performed at [facility], [address] [place], [place] [address]   DG Chest 2 View  Result Date:  07/22/2023 CLINICAL DATA:  Right chest pain. EXAM: CHEST - 2 VIEW COMPARISON:  Chest radiograph dated 05/18/2022. FINDINGS: The heart size and mediastinal contours are within normal limits. Both lungs are clear. The visualized skeletal structures are unremarkable. IMPRESSION: No  active cardiopulmonary disease. Electronically Signed   By: Romona Curls M.D.   On: 07/22/2023 14:15   CT ABDOMEN PELVIS W CONTRAST  Result Date: 07/22/2023 CLINICAL DATA:  RUQ pain, vomiting. EXAM: CT ABDOMEN AND PELVIS WITH CONTRAST TECHNIQUE: Multidetector CT imaging of the abdomen and pelvis was performed using the standard protocol following bolus administration of intravenous contrast. RADIATION DOSE REDUCTION: This exam was performed according to the departmental dose-optimization program which includes automated exposure control, adjustment of the mA and/or kV according to patient size and/or use of iterative reconstruction technique. CONTRAST:  OMNIPAQUE IOHEXOL 300 MG/ML  SOLN COMPARISON:  06/28/2005. FINDINGS: Lower chest: Linear subsegmental atelectasis left base. No pleural or pericardial effusion. Small hiatal hernia. Hepatobiliary: Tiny subcentimeter cyst right lobe of the liver. No biliary ductal dilatation. No enhancing parenchymal lesions. The gallbladder is abnormal with calcified gallstones, wall thickening or pericholecystic fluid. Findings are concerning for cholecystitis. Consider HIDA scan for further evaluation if indicated. Pancreas: Unremarkable. No pancreatic ductal dilatation or surrounding inflammatory changes. Spleen: Normal in size without focal abnormality. Adrenals/Urinary Tract: There is an intermediate density 1.8 cm nodule in the right adrenal gland and a 9 mm similar nodule in the left adrenal gland. These are indeterminate based on density measurements on a contrast-enhanced study. Consider noncontrast MRI for further evaluation. There is a 2 cm cyst in the left kidney. No hydronephrosis or  nephrolithiasis. Stomach/Bowel: Stomach is within normal limits. Appendix appears normal. No evidence of bowel wall thickening, distention, or inflammatory changes. Vascular/Lymphatic: Aortic atherosclerosis. No enlarged abdominal or pelvic lymph nodes. Reproductive: Prostate is unremarkable. Other: No abdominal wall hernia or abnormality. No abdominopelvic ascites. Musculoskeletal: No acute or significant osseous findings. Right hip prosthesis with artifact that degrades images of the pelvis. IMPRESSION: 1. Abnormal gallbladder with stones, wall thickening and pericholecystic fluid suggesting cholecystitis. This can be confirmed with a HIDA scan, if indicated. 2. Bilateral adrenal nodules that are indeterminate. Nonemergent noncontrast MRI can be done for further evaluation of this finding. 3. Tiny cysts in the liver. 4. Left kidney cyst. 5. Small hiatal hernia. Electronically Signed   By: Layla Maw M.D.   On: 07/22/2023 14:12    Pending Labs Unresulted Labs (From admission, onward)    None       Vitals/Pain Today's Vitals   07/22/23 1444 07/22/23 1512 07/22/23 1530 07/22/23 1545  BP:  (!) 150/95 (!) 138/91   Pulse:  66 (!) 59   Resp:  16    Temp:  97.7 F (36.5 C)    TempSrc:  Oral    SpO2:  94% 99%   Weight:      Height:      PainSc: 9    10-Worst pain ever    Isolation Precautions No active isolations  Medications Medications  metroNIDAZOLE (FLAGYL) IVPB 500 mg (500 mg Intravenous New Bag/Given 07/22/23 1545)  ondansetron (ZOFRAN) injection 4 mg (4 mg Intravenous Given 07/22/23 1309)  fentaNYL (SUBLIMAZE) injection 50 mcg (50 mcg Intravenous Given 07/22/23 1306)  iohexol (OMNIPAQUE) 300 MG/ML solution 100 mL (100 mLs Intravenous Contrast Given 07/22/23 1347)  fentaNYL (SUBLIMAZE) injection 50 mcg (50 mcg Intravenous Given 07/22/23 1445)  cefTRIAXone (ROCEPHIN) 2 g in sodium chloride 0.9 % 100 mL IVPB (2 g Intravenous New Bag/Given 07/22/23 1503)    Mobility walks      Focused Assessments Complains of abd and lower back pain. Lap chole scheduled for tomorrow   R Recommendations: See Admitting Provider Note  Report given to:  Additional Notes: NPO after midnight

## 2023-07-22 NOTE — ED Notes (Addendum)
Pt informed about needing urine sample

## 2023-07-23 ENCOUNTER — Encounter (HOSPITAL_COMMUNITY): Payer: Self-pay | Admitting: Internal Medicine

## 2023-07-23 ENCOUNTER — Other Ambulatory Visit: Payer: Self-pay

## 2023-07-23 ENCOUNTER — Encounter (HOSPITAL_COMMUNITY)
Admission: EM | Disposition: A | Discharge status: Home / Self Care | Payer: Self-pay | Source: Home / Self Care | Attending: Family Medicine

## 2023-07-23 ENCOUNTER — Inpatient Hospital Stay (HOSPITAL_COMMUNITY): Payer: Medicaid Other | Admitting: Certified Registered"

## 2023-07-23 DIAGNOSIS — K810 Acute cholecystitis: Secondary | ICD-10-CM

## 2023-07-23 HISTORY — PX: CHOLECYSTECTOMY: SHX55

## 2023-07-23 LAB — GLUCOSE, CAPILLARY
Glucose-Capillary: 105 mg/dL — ABNORMAL HIGH (ref 70–99)
Glucose-Capillary: 110 mg/dL — ABNORMAL HIGH (ref 70–99)
Glucose-Capillary: 134 mg/dL — ABNORMAL HIGH (ref 70–99)
Glucose-Capillary: 182 mg/dL — ABNORMAL HIGH (ref 70–99)
Glucose-Capillary: 218 mg/dL — ABNORMAL HIGH (ref 70–99)

## 2023-07-23 LAB — CBC
HCT: 40.9 % (ref 39.0–52.0)
Hemoglobin: 13.5 g/dL (ref 13.0–17.0)
MCH: 30.5 pg (ref 26.0–34.0)
MCHC: 33 g/dL (ref 30.0–36.0)
MCV: 92.3 fL (ref 80.0–100.0)
Platelets: 253 10*3/uL (ref 150–400)
RBC: 4.43 MIL/uL (ref 4.22–5.81)
RDW: 13 % (ref 11.5–15.5)
WBC: 8.7 10*3/uL (ref 4.0–10.5)
nRBC: 0 % (ref 0.0–0.2)

## 2023-07-23 LAB — COMPREHENSIVE METABOLIC PANEL
ALT: 47 U/L — ABNORMAL HIGH (ref 0–44)
AST: 25 U/L (ref 15–41)
Albumin: 3.9 g/dL (ref 3.5–5.0)
Alkaline Phosphatase: 64 U/L (ref 38–126)
Anion gap: 12 (ref 5–15)
BUN: 9 mg/dL (ref 6–20)
CO2: 27 mmol/L (ref 22–32)
Calcium: 9 mg/dL (ref 8.9–10.3)
Chloride: 96 mmol/L — ABNORMAL LOW (ref 98–111)
Creatinine, Ser: 1.07 mg/dL (ref 0.61–1.24)
GFR, Estimated: >60 mL/min (ref >60)
Glucose, Bld: 107 mg/dL — ABNORMAL HIGH (ref 70–99)
Potassium: 3.7 mmol/L (ref 3.5–5.1)
Sodium: 135 mmol/L (ref 135–145)
Total Bilirubin: 0.6 mg/dL (ref 0.3–1.2)
Total Protein: 7.5 g/dL (ref 6.5–8.1)

## 2023-07-23 LAB — SURGICAL PCR SCREEN
MRSA, PCR: NEGATIVE
Staphylococcus aureus: NEGATIVE

## 2023-07-23 LAB — PHOSPHORUS: Phosphorus: 3.8 mg/dL (ref 2.5–4.6)

## 2023-07-23 LAB — HIV ANTIBODY (ROUTINE TESTING W REFLEX): HIV Screen 4th Generation wRfx: NONREACTIVE

## 2023-07-23 LAB — MAGNESIUM: Magnesium: 1.7 mg/dL (ref 1.7–2.4)

## 2023-07-23 SURGERY — LAPAROSCOPIC CHOLECYSTECTOMY
Anesthesia: General

## 2023-07-23 MED ORDER — INDOCYANINE GREEN 25 MG IV SOLR: 2.5000 mg | Freq: Once | INTRAVENOUS | Status: DC

## 2023-07-23 MED ORDER — MIDAZOLAM HCL 5 MG/5ML IJ SOLN
INTRAMUSCULAR | Status: DC | PRN
  Administered 2023-07-23: 2 mg via INTRAVENOUS

## 2023-07-23 MED ORDER — PROPOFOL 10 MG/ML IV BOLUS
INTRAVENOUS | Status: DC | PRN
  Administered 2023-07-23: 30 mg via INTRAVENOUS
  Administered 2023-07-23: 150 mg via INTRAVENOUS

## 2023-07-23 MED ORDER — ONDANSETRON HCL 4 MG/2ML IJ SOLN
INTRAMUSCULAR | Status: DC | PRN
Start: 2023-07-23 — End: 2023-07-23
  Administered 2023-07-23: 4 mg via INTRAVENOUS

## 2023-07-23 MED ORDER — LACTATED RINGERS IV SOLN: INTRAVENOUS | Status: DC

## 2023-07-23 MED ORDER — FENTANYL CITRATE (PF) 100 MCG/2ML IJ SOLN
INTRAMUSCULAR | Status: AC
  Filled 2023-07-23: qty 2

## 2023-07-23 MED ORDER — DEXAMETHASONE SODIUM PHOSPHATE 10 MG/ML IJ SOLN
INTRAMUSCULAR | Status: AC
  Filled 2023-07-23: qty 1

## 2023-07-23 MED ORDER — BUPIVACAINE HCL (PF) 0.5 % IJ SOLN
INTRAMUSCULAR | Status: DC | PRN
  Administered 2023-07-23: 30 mL

## 2023-07-23 MED ORDER — PROPOFOL 10 MG/ML IV BOLUS
INTRAVENOUS | Status: AC
  Filled 2023-07-23: qty 20

## 2023-07-23 MED ORDER — SEVOFLURANE IN SOLN
RESPIRATORY_TRACT | Status: AC
  Filled 2023-07-23: qty 250

## 2023-07-23 MED ORDER — HYDROMORPHONE HCL 1 MG/ML IJ SOLN
INTRAMUSCULAR | Status: DC | PRN
  Administered 2023-07-23 (×2): .5 mg via INTRAVENOUS

## 2023-07-23 MED ORDER — ROCURONIUM BROMIDE 10 MG/ML (PF) SYRINGE
PREFILLED_SYRINGE | INTRAVENOUS | Status: AC
  Filled 2023-07-23: qty 10

## 2023-07-23 MED ORDER — MIDAZOLAM HCL 2 MG/2ML IJ SOLN
INTRAMUSCULAR | Status: AC
  Filled 2023-07-23: qty 2

## 2023-07-23 MED ORDER — CHLORHEXIDINE GLUCONATE CLOTH 2 % EX PADS: 6.0000 | MEDICATED_PAD | Freq: Once | CUTANEOUS | Status: DC

## 2023-07-23 MED ORDER — FENTANYL CITRATE (PF) 100 MCG/2ML IJ SOLN
INTRAMUSCULAR | Status: DC | PRN
  Administered 2023-07-23 (×4): 50 ug via INTRAVENOUS

## 2023-07-23 MED ORDER — HEMOSTATIC AGENTS (NO CHARGE) OPTIME
TOPICAL | Status: DC | PRN
  Administered 2023-07-23: 1 via TOPICAL

## 2023-07-23 MED ORDER — FENTANYL CITRATE PF 50 MCG/ML IJ SOSY
PREFILLED_SYRINGE | INTRAMUSCULAR | Status: AC
  Administered 2023-07-23: 50 ug via INTRAVENOUS
  Filled 2023-07-23: qty 1

## 2023-07-23 MED ORDER — HYDROMORPHONE HCL 1 MG/ML IJ SOLN
0.5000 mg | INTRAMUSCULAR | Status: DC | PRN
  Administered 2023-07-23 – 2023-07-25 (×6): 0.5 mg via INTRAVENOUS
  Filled 2023-07-23 (×6): qty 0.5

## 2023-07-23 MED ORDER — DEXAMETHASONE SODIUM PHOSPHATE 10 MG/ML IJ SOLN
INTRAMUSCULAR | Status: DC | PRN
  Administered 2023-07-23: 4 mg via INTRAVENOUS

## 2023-07-23 MED ORDER — FENTANYL CITRATE PF 50 MCG/ML IJ SOSY: 50.0000 ug | PREFILLED_SYRINGE | INTRAMUSCULAR | Status: DC | PRN

## 2023-07-23 MED ORDER — SUGAMMADEX SODIUM 200 MG/2ML IV SOLN
INTRAVENOUS | Status: DC | PRN
  Administered 2023-07-23: 200 mg via INTRAVENOUS

## 2023-07-23 MED ORDER — ORAL CARE MOUTH RINSE: 15.0000 mL | Freq: Once | OROMUCOSAL | Status: AC

## 2023-07-23 MED ORDER — HYDROMORPHONE HCL 1 MG/ML IJ SOLN
INTRAMUSCULAR | Status: AC
  Filled 2023-07-23: qty 1

## 2023-07-23 MED ORDER — CHLORHEXIDINE GLUCONATE 0.12 % MT SOLN
15.0000 mL | Freq: Once | OROMUCOSAL | Status: AC
  Administered 2023-07-23: 15 mL via OROMUCOSAL

## 2023-07-23 MED ORDER — CHLORHEXIDINE GLUCONATE CLOTH 2 % EX PADS
6.0000 | MEDICATED_PAD | Freq: Once | CUTANEOUS | Status: AC
  Administered 2023-07-23: 2 via TOPICAL

## 2023-07-23 MED ORDER — LIDOCAINE HCL (CARDIAC) PF 100 MG/5ML IV SOSY
PREFILLED_SYRINGE | INTRAVENOUS | Status: DC | PRN
  Administered 2023-07-23: 80 mg via INTRATRACHEAL

## 2023-07-23 MED ORDER — ROCURONIUM BROMIDE 10 MG/ML (PF) SYRINGE
PREFILLED_SYRINGE | INTRAVENOUS | Status: DC | PRN
  Administered 2023-07-23: 20 mg via INTRAVENOUS
  Administered 2023-07-23: 50 mg via INTRAVENOUS
  Administered 2023-07-23: 40 mg via INTRAVENOUS

## 2023-07-23 MED ORDER — ONDANSETRON HCL 4 MG/2ML IJ SOLN
INTRAMUSCULAR | Status: AC
  Filled 2023-07-23: qty 2

## 2023-07-23 MED ORDER — SODIUM CHLORIDE 0.9 % IV SOLN
2.0000 g | INTRAVENOUS | Status: AC
  Administered 2023-07-23: 2 g via INTRAVENOUS
  Filled 2023-07-23: qty 2

## 2023-07-23 MED ORDER — OXYCODONE HCL 5 MG PO TABS
5.0000 mg | ORAL_TABLET | ORAL | Status: DC | PRN
  Administered 2023-07-23 – 2023-07-24 (×3): 5 mg via ORAL
  Filled 2023-07-23 (×4): qty 1

## 2023-07-23 MED ORDER — SODIUM CHLORIDE 0.9 % IR SOLN
Status: DC | PRN
  Administered 2023-07-23: 1000 mL
  Administered 2023-07-23: 3000 mL

## 2023-07-23 SURGICAL SUPPLY — 56 items
ADH SKN CLS APL DERMABOND .7 (GAUZE/BANDAGES/DRESSINGS) ×1
APL PRP STRL LF DISP 70% ISPRP (MISCELLANEOUS) ×1
APPLIER CLIP ROT 10 11.4 M/L (STAPLE) ×1
APR CLP MED LRG 11.4X10 (STAPLE) ×1
BLADE SURG 15 STRL LF DISP TIS (BLADE) ×1 IMPLANT
BLADE SURG 15 STRL SS (BLADE) ×1
CHLORAPREP W/TINT 26 (MISCELLANEOUS) ×2 IMPLANT
CLIP APPLIE ROT 10 11.4 M/L (STAPLE) ×2 IMPLANT
CLOTH BEACON ORANGE TIMEOUT ST (SAFETY) ×1 IMPLANT
COVER LIGHT HANDLE STERIS (MISCELLANEOUS) ×2 IMPLANT
CUTTER FLEX LINEAR 45M (STAPLE) IMPLANT
DECANTER SPIKE VIAL GLASS SM (MISCELLANEOUS) ×2 IMPLANT
DERMABOND ADVANCED .7 DNX12 (GAUZE/BANDAGES/DRESSINGS) ×1 IMPLANT
DRSG TEGADERM 2-3/8X2-3/4 SM (GAUZE/BANDAGES/DRESSINGS) IMPLANT
ELECT REM PT RETURN 9FT ADLT (ELECTROSURGICAL) ×1
ELECTRODE REM PT RTRN 9FT ADLT (ELECTROSURGICAL) ×1 IMPLANT
EVACUATOR DRAINAGE 10X20 100CC (DRAIN) IMPLANT
EVACUATOR SILICONE 100CC (DRAIN) ×1
GAUZE 4X4 16PLY ~~LOC~~+RFID DBL (SPONGE) IMPLANT
GAUZE SPONGE 2X2 STRL 8-PLY (GAUZE/BANDAGES/DRESSINGS) IMPLANT
GLOVE BIO SURGEON STRL SZ 6.5 (GLOVE) ×1 IMPLANT
GLOVE BIO SURGEON STRL SZ7 (GLOVE) IMPLANT
GLOVE BIOGEL PI IND STRL 6.5 (GLOVE) ×2 IMPLANT
GLOVE BIOGEL PI IND STRL 7.0 (GLOVE) ×2 IMPLANT
GOWN STRL REUS W/TWL LRG LVL3 (GOWN DISPOSABLE) ×3 IMPLANT
HEMOSTAT SNOW SURGICEL 2X4 (HEMOSTASIS) ×1 IMPLANT
INST SET LAPROSCOPIC AP (KITS) ×1 IMPLANT
KIT TURNOVER KIT A (KITS) ×1 IMPLANT
MANIFOLD NEPTUNE II (INSTRUMENTS) ×1 IMPLANT
NDL HYPO 21X1.5 SAFETY (NEEDLE) ×2 IMPLANT
NDL INSUFFLATION 14GA 120MM (NEEDLE) ×1 IMPLANT
NEEDLE HYPO 21X1.5 SAFETY (NEEDLE) ×1 IMPLANT
NEEDLE INSUFFLATION 14GA 120MM (NEEDLE) ×1 IMPLANT
NS IRRIG 1000ML POUR BTL (IV SOLUTION) ×1 IMPLANT
PACK LAP CHOLE LZT030E (CUSTOM PROCEDURE TRAY) ×1 IMPLANT
PAD ARMBOARD 7.5X6 YLW CONV (MISCELLANEOUS) ×1 IMPLANT
POSITIONER HEAD 8X9X4 ADT (SOFTGOODS) ×1 IMPLANT
RELOAD STAPLE 45 3.5 BLU ETS (ENDOMECHANICALS) IMPLANT
RELOAD STAPLE TA45 3.5 REG BLU (ENDOMECHANICALS) ×1 IMPLANT
SET BASIN LINEN APH (SET/KITS/TRAYS/PACK) ×1 IMPLANT
SET TUBE IRRIG SUCTION NO TIP (IRRIGATION / IRRIGATOR) IMPLANT
SET TUBE SMOKE EVAC HIGH FLOW (TUBING) ×1 IMPLANT
SLEEVE Z-THREAD 5X100MM (TROCAR) ×1 IMPLANT
SPONGE DRAIN TRACH 4X4 STRL 2S (GAUZE/BANDAGES/DRESSINGS) IMPLANT
STAPLER VISISTAT (STAPLE) IMPLANT
SUT ETHILON 3 0 FSL (SUTURE) IMPLANT
SUT MNCRL AB 4-0 PS2 18 (SUTURE) ×2 IMPLANT
SUT VICRYL 0 UR6 27IN ABS (SUTURE) ×1 IMPLANT
SYR 30ML LL (SYRINGE) ×2 IMPLANT
SYS BAG RETRIEVAL 10MM (BASKET) ×1
SYSTEM BAG RETRIEVAL 10MM (BASKET) ×1 IMPLANT
TROCAR Z-THRD FIOS HNDL 11X100 (TROCAR) ×1 IMPLANT
TROCAR Z-THREAD FIOS 5X100MM (TROCAR) ×1 IMPLANT
TROCAR Z-THREAD SLEEVE 11X100 (TROCAR) ×2 IMPLANT
TUBE CONNECTING 12X1/4 (SUCTIONS) ×1 IMPLANT
WARMER LAPAROSCOPE (MISCELLANEOUS) ×1 IMPLANT

## 2023-07-23 NOTE — Progress Notes (Signed)
PROGRESS NOTE    Patient: David Knox                            PCP: Anabel Halon, MD                    DOB: 12-22-1965            DOA: 07/22/2023 [id number]             DOS: 07/23/2023, 12:24 PM   LOS: 1 day   Date of Service: The patient was seen and examined on 07/23/2023  Subjective:   The patient was seen and examined.  Mildly somnolent postop Hemodynamically stable.  Postop day #0-procedure well, JP drain was placed   Brief Narrative:   David Knox is a 57 y.o. male with medical history significant of T2DM, hypertension, hyperlipidemia, GERD, alcohol user who presents to the emergency department due to 5-day onset of right upper abdominal pain, right flank and right anterior chest wall pain which was associated with an episode of nonbilious, nonbloody vomiting yesterday in the evening and several episodes of nonbloody loose stools.  Abdominal pain was constant, nonradiating and achy in nature.  Pain was not aggravated by food or alcohol (consumes alcohol daily and denies history of alcohol withdrawal).  Patient denies fever, chills, headache, blurry vision, palpitations or any sick contact.   ED Course:  In the emergency department, BP was 137/22, other vital signs were within normal range.  Workup in the ED showed normal CBC except for hematocrit of 30.7.  BMP was normal, ALT was 55, lipase 27, alcohol level was less than 10, urinalysis was normal. CT abdomen and pelvis with contrast showed abnormal gallbladder with stones, wall thickening and pericholecystic fluid suggesting cholecystitis. Chest x-ray showed no active cardiopulmonary disease Patient was treated with IV fentanyl 50 mcg x 2 for abdominal pain, IV ceftriaxone and Flagyl were given.  General surgery (Dr. Henreitta Leber) was consulted and recommended admitting patient with plan to take patient to the OR      Assessment & Plan:   Principal Problem:   Acute suppurative cholecystitis Active  Problems:   Alcohol abuse   Tobacco abuse   Essential hypertension   GERD (gastroesophageal reflux disease)   Type 2 diabetes mellitus with diabetic neuropathy, unspecified (HCC)   Adrenal nodule (HCC)   Type 2 diabetes mellitus (HCC)        Acute cholecystitis -Postop day #0 -07/23/2023 -s/p cholecystectomy JP drain was placed- -General Surgery following -Initiated IV antibiotics  CT abdomen and pelvis was suggestive of cholecystitis Continue IV NS at 100 mLs/Hr -on IV ceftriaxone and Flagyl >>  Continue IV Dilaudid 0.5 mg q.3h p.r.n. for moderate to severe pain Continue IV Zofran p.r.n.     Bilateral adrenal nodules CT abdomen and pelvis showed bilateral adrenal nodules that are indeterminate.  Nonemergent noncontrast MRI can be done for further evaluation of this finding per radiologist recommendation -Patient can follow-up as an outpatient with pulmonologist and obtain MRI if needed   Essential hypertension Monitor BP closely, as needed hydralazine, Will Continue amlodipine, Lopressor, Avapro when tolerating p.o.   T2DM Will hold home medication of metformin and glipizide for now Hemoglobin A1c on 04/25/2023 was 6.4 Continue ISS and hypoglycemic protocol    GERD Continue Protonix   Peripheral neuropathy Continue gabapentin   Alcohol abuse POA: Alcohol level was less than 10 Patient denies any history of alcohol  withdrawal symptoms Patient was counseled on alcohol abuse cessation   Tobacco abuse Patient was counseled on tobacco abuse cessation      Consults: General Surgery (By ED PA)    ----------------------------------------------------------------------------------------------------------------------------------------------- Nutritional status:  The patient's BMI is: Body mass index is 27.61 kg/m. I agree with the assessment and plan as outlined  ------------------------------------------------------------------------------------------------------------------------------------------------  DVT prophylaxis:  heparin injection 5,000 Units Start: 07/22/23 1615 SCDs Start: 07/22/23 1602   Code Status:   Code Status: Full Code  Family Communication: No family member present at bedside-Advance care planning has been discussed.   Admission status:   Status is: Inpatient Remains inpatient appropriate because: Needing surgical intervention, drain in place, IV antibiotics   Disposition: From  - home             Planning for discharge in 1-2 days: to   Procedures:   No admission procedures for hospital encounter.   Antimicrobials:  Anti-infectives (From admission, onward)    Start     Dose/Rate Route Frequency Ordered Stop   07/23/23 0830  cefoTEtan (CEFOTAN) 2 g in sodium chloride 0.9 % 100 mL IVPB        2 g 200 mL/hr over 30 Minutes Intravenous On call to O.R. 07/23/23 0818 07/23/23 0934   07/22/23 1545  metroNIDAZOLE (FLAGYL) IVPB 500 mg        500 mg 100 mL/hr over 60 Minutes Intravenous  Once 07/22/23 1536 07/22/23 1654   07/22/23 1500  cefTRIAXone (ROCEPHIN) 2 g in sodium chloride 0.9 % 100 mL IVPB        2 g 200 mL/hr over 30 Minutes Intravenous  Once 07/22/23 1452 07/22/23 1603        Medication:   amLODipine  5 mg Oral Daily   atorvastatin  20 mg Oral Daily   gabapentin  300 mg Oral BID   heparin  5,000 Units Subcutaneous Q8H   insulin aspart  0-9 Units Subcutaneous TID WC   irbesartan  150 mg Oral Daily   metoprolol tartrate  25 mg Oral BID   pantoprazole  40 mg Oral BID    HYDROmorphone (DILAUDID) injection, ondansetron **OR** ondansetron (ZOFRAN) IV, oxyCODONE   Objective:   Vitals:   07/23/23 1130 07/23/23 1145 07/23/23 1200 07/23/23 1215  BP: 134/84 132/79 134/86 121/82  Pulse: 80 89 89 86  Resp: 16 17 18 19   Temp:      TempSrc:      SpO2: 100% 95% 95% 96%  Weight:      Height:         Intake/Output Summary (Last 24 hours) at 07/23/2023 1224 Last data filed at 07/23/2023 1108 Gross per 24 hour  Intake 1500 ml  Output 125 ml  Net 1375 ml   Filed Weights   07/22/23 1139 07/23/23 0817  Weight: 89.8 kg 89.8 kg     Physical examination:   Constitution: Somnolent. Psychiatric:   Normal and stable mood and affect, cognition intact,   HEENT:        Normocephalic, PERRL, otherwise with in Normal limits  Chest:         Chest symmetric Cardio vascular:  S1/S2, RRR, No murmure, No Rubs or Gallops  pulmonary: Clear to auscultation bilaterally, respirations unlabored, negative wheezes / crackles Abdomen: Abdominal drain in place,  soft, non-distended, bowel sounds,no masses, no organomegaly Muscular skeletal: Limited exam - in bed, able to move all 4 extremities,   Neuro: CNII-XII intact. , normal motor and sensation, reflexes intact  Extremities:  No pitting edema lower extremities, +2 pulses  Skin: Dry, warm to touch, negative for any Rashes, abdominal surgical wounds clean, dressing in place    ------------------------------------------------------------------------------------------------------------------------------------------    LABs:     Latest Ref Rng & Units 07/23/2023    5:16 AM 07/22/2023    1:11 PM 08/21/2022    9:48 AM  CBC  WBC 4.0 - 10.5 K/uL 8.7  7.7  9.6   Hemoglobin 13.0 - 17.0 g/dL 01.0  27.2  53.6   Hematocrit 39.0 - 52.0 % 40.9  38.7  41.8   Platelets 150 - 400 K/uL 253  239  372       Latest Ref Rng & Units 07/23/2023    5:16 AM 07/22/2023    1:11 PM 11/30/2022    9:54 AM  CMP  Glucose 70 - 99 mg/dL 644  96  034   BUN 6 - 20 mg/dL 9  12  10    Creatinine 0.61 - 1.24 mg/dL 7.42  5.95  6.38   Sodium 135 - 145 mmol/L 135  136  141   Potassium 3.5 - 5.1 mmol/L 3.7  3.7  4.1   Chloride 98 - 111 mmol/L 96  102  104   CO2 22 - 32 mmol/L 27  25  22    Calcium 8.9 - 10.3 mg/dL 9.0  8.9  9.7   Total Protein 6.5 - 8.1 g/dL 7.5  7.0  7.0    Total Bilirubin 0.3 - 1.2 mg/dL 0.6  0.7  <7.5   Alkaline Phos 38 - 126 U/L 64  62  72   AST 15 - 41 U/L 25  39  16   ALT 0 - 44 U/L 47  55  17        Micro Results Recent Results (from the past 240 hour(s))  Surgical pcr screen     Status: None   Collection Time: 07/23/23 12:18 AM   Specimen: Nasal Mucosa; Nasal Swab  Result Value Ref Range Status   MRSA, PCR NEGATIVE NEGATIVE Final   Staphylococcus aureus NEGATIVE NEGATIVE Final    Comment: (NOTE) The Xpert SA Assay (FDA approved for NASAL specimens in patients 21 years of age and older), is one component of a comprehensive surveillance program. It is not intended to diagnose infection nor to guide or monitor treatment. Performed at [facility], [address] [place], [place] [address]     Radiology Reports DG Chest 2 View  Result Date: 07/22/2023 CLINICAL DATA:  Right chest pain. EXAM: CHEST - 2 VIEW COMPARISON:  Chest radiograph dated 05/18/2022. FINDINGS: The heart size and mediastinal contours are within normal limits. Both lungs are clear. The visualized skeletal structures are unremarkable. IMPRESSION: No active cardiopulmonary disease. Electronically Signed   By: Romona Curls M.D.   On: 07/22/2023 14:15   CT ABDOMEN PELVIS W CONTRAST  Result Date: 07/22/2023 CLINICAL DATA:  RUQ pain, vomiting. EXAM: CT ABDOMEN AND PELVIS WITH CONTRAST TECHNIQUE: Multidetector CT imaging of the abdomen and pelvis was performed using the standard protocol following bolus administration of intravenous contrast. RADIATION DOSE REDUCTION: This exam was performed according to the departmental dose-optimization program which includes automated exposure control, adjustment of the mA and/or kV according to patient size and/or use of iterative reconstruction technique. CONTRAST:  OMNIPAQUE IOHEXOL 300 MG/ML  SOLN COMPARISON:  06/28/2005. FINDINGS: Lower chest: Linear subsegmental atelectasis left base. No pleural or pericardial effusion.  Small hiatal hernia. Hepatobiliary: Tiny subcentimeter cyst right lobe of the liver.  No biliary ductal dilatation. No enhancing parenchymal lesions. The gallbladder is abnormal with calcified gallstones, wall thickening or pericholecystic fluid. Findings are concerning for cholecystitis. Consider HIDA scan for further evaluation if indicated. Pancreas: Unremarkable. No pancreatic ductal dilatation or surrounding inflammatory changes. Spleen: Normal in size without focal abnormality. Adrenals/Urinary Tract: There is an intermediate density 1.8 cm nodule in the right adrenal gland and a 9 mm similar nodule in the left adrenal gland. These are indeterminate based on density measurements on a contrast-enhanced study. Consider noncontrast MRI for further evaluation. There is a 2 cm cyst in the left kidney. No hydronephrosis or nephrolithiasis. Stomach/Bowel: Stomach is within normal limits. Appendix appears normal. No evidence of bowel wall thickening, distention, or inflammatory changes. Vascular/Lymphatic: Aortic atherosclerosis. No enlarged abdominal or pelvic lymph nodes. Reproductive: Prostate is unremarkable. Other: No abdominal wall hernia or abnormality. No abdominopelvic ascites. Musculoskeletal: No acute or significant osseous findings. Right hip prosthesis with artifact that degrades images of the pelvis. IMPRESSION: 1. Abnormal gallbladder with stones, wall thickening and pericholecystic fluid suggesting cholecystitis. This can be confirmed with a HIDA scan, if indicated. 2. Bilateral adrenal nodules that are indeterminate. Nonemergent noncontrast MRI can be done for further evaluation of this finding. 3. Tiny cysts in the liver. 4. Left kidney cyst. 5. Small hiatal hernia. Electronically Signed   By: Layla Maw M.D.   On: 07/22/2023 14:12    SIGNED: Kendell Bane, MD, FHM. FAAFP. [facility] - [facility] Time spent - 55 min.  In seeing, evaluating and examining the patient. Reviewing  medical records, labs, drawn plan of care. [facility],  Pager (please use [email or web address] to page/ text) Please use Epic Secure Chat for non-urgent communication (7AM-7PM)  If 7PM-7AM, please contact night-coverage [email or web address], 07/23/2023, 12:24 PM

## 2023-07-23 NOTE — Discharge Instructions (Signed)
Discharge Laparoscopic Surgery Instructions:  Take your antibiotics as prescribed.  Can remove gauze bandages 07/25/2023 if not removed before you leave.   JP Drain Care  Please keep the drain clean and dry. Replace the gauze/ tape around the drain if it gets dirty or wet/ saturated. Please do not mess with or cut the stitch that is keeping the drain in place. Secure the drain to your clothes so that it does not get dislodged.  You may want to wear a binder around your abdomen (girdle) at night for sleeping if you are worried about it getting pulled out.  Please record the output from the drain daily including the color and the amount in milliliters.  Please keep the drain covered with plastic and tape when you shower so that it does not get wet.   Common Complaints: Right shoulder pain is common after laparoscopic surgery. This is secondary to the gas used in the surgery being trapped under the diaphragm.  Walk to help your body absorb the gas. This will improve in a few days. Pain at the port sites are common, especially the larger port sites. This will improve with time.  Some nausea is common and poor appetite. The main goal is to stay hydrated the first few days after surgery.   Diet/ Activity: Diet as tolerated. You may not have an appetite, but it is important to stay hydrated. Drink 64 ounces of water a day. Your appetite will return with time.  Shower per your regular routine daily.  Do not take hot showers. Take warm showers that are less than 10 minutes. Rest and listen to your body, but do not remain in bed all day.  Walk everyday for at least 15-20 minutes. Deep cough and move around every 1-2 hours in the first few days after surgery.  Do not lift > 10 lbs, perform excessive bending, pushing, pulling, squatting for 1-2 weeks after surgery.  Do not pick at the dermabond glue on your incision sites.  This glue film will remain in place for 1-2 weeks and will start to peel off.   Do not place lotions or balms on your incision unless instructed to specifically by Dr. Henreitta Leber.   Pain Expectations and Narcotics: -After surgery you will have pain associated with your incisions and this is normal. The pain is muscular and nerve pain, and will get better with time. -You are encouraged and expected to take non narcotic medications like tylenol and ibuprofen (when able) to treat pain as multiple modalities can aid with pain treatment. -Narcotics are only used when pain is severe or there is breakthrough pain. -You are not expected to have a pain score of 0 after surgery, as we cannot prevent pain. A pain score of 3-4 that allows you to be functional, move, walk, and tolerate some activity is the goal. The pain will continue to improve over the days after surgery and is dependent on your surgery. -Due to [place] law, we are only able to give a certain amount of pain medication to treat post operative pain, and we only give additional narcotics on a patient by patient basis.  -For most laparoscopic surgery, studies have shown that the majority of patients only need 10-15 narcotic pills, and for open surgeries most patients only need 15-20.   -Having appropriate expectations of pain and knowledge of pain management with non narcotics is important as we do not want anyone to become addicted to narcotic pain medication.  -Using ice packs  in the first 48 hours and heating pads after 48 hours, wearing an abdominal binder (when recommended), and using over the counter medications are all ways to help with pain management.   -Simple acts like meditation and mindfulness practices after surgery can also help with pain control and research has proven the benefit of these practices.  Medication: Take tylenol and ibuprofen as needed for pain control, alternating every 4-6 hours.  Example:  Tylenol 1000mg  @ 6am, 12noon, 6pm, (Do not exceed 4000mg  of tylenol a day). Ibuprofen 800mg  @ 9am,  3pm, 9pm, 3am (Do not exceed 3600mg  of ibuprofen a day).  Take Roxicodone for breakthrough pain every 4 hours.  Take Colace for constipation related to narcotic pain medication. If you do not have a bowel movement in 2 days, take Miralax over the counter.  Drink plenty of water to also prevent constipation.   Contact Information: If you have questions or concerns, please call our office, [phone], Monday- Thursday 8AM-5PM and Friday 8AM-12Noon.  If it is after hours or on the weekend, please call [facility], [phone], [phone], and ask to speak to the surgeon on call for Dr. Henreitta Leber at [facility].

## 2023-07-23 NOTE — Hospital Course (Addendum)
David Knox is a 57 y.o. male with medical history significant of T2DM, hypertension, hyperlipidemia, GERD, alcohol user who presents to the emergency department due to 5-day onset of right upper abdominal pain, right flank and right anterior chest wall pain which was associated with an episode of nonbilious, nonbloody vomiting yesterday in the evening and several episodes of nonbloody loose stools.  Abdominal pain was constant, nonradiating and achy in nature.  Pain was not aggravated by food or alcohol (consumes alcohol daily and denies history of alcohol withdrawal).  Patient denies fever, chills, headache, blurry vision, palpitations or any sick contact.   ED Course:  In the emergency department, BP was 137/22, other vital signs were within normal range.  Workup in the ED showed normal CBC except for hematocrit of 30.7.  BMP was normal, ALT was 55, lipase 27, alcohol level was less than 10, urinalysis was normal. CT abdomen and pelvis with contrast showed abnormal gallbladder with stones, wall thickening and pericholecystic fluid suggesting cholecystitis. Chest x-ray showed no active cardiopulmonary disease Patient was treated with IV fentanyl 50 mcg x 2 for abdominal pain, IV ceftriaxone and Flagyl were given.  General surgery (Dr. Henreitta Leber) was consulted and recommended admitting patient with plan to take patient to the OR      Assessment & Plan:   Principal Problem:   Acute suppurative cholecystitis Active Problems:   Alcohol abuse   Tobacco abuse   Essential hypertension   GERD (gastroesophageal reflux disease)   Type 2 diabetes mellitus with diabetic neuropathy, unspecified (HCC)   Adrenal nodule (HCC)   Type 2 diabetes mellitus (HCC)        Acute cholecystitis -Postop day #1 -07/23/2023 -s/p cholecystectomy JP drain was placed- -General Surgery following -Initiated IV antibiotics  CT abdomen and pelvis was suggestive of cholecystitis -Continue IV fluids, IV  antibiotics -on IV ceftriaxone and Flagyl >>  Continue IV Dilaudid 0.5 mg q.3h p.r.n. for moderate to severe pain Continue IV Zofran p.r.n.  Still having significant abdominal pain Needing IV as needed analgesics    Bilateral adrenal nodules -Stable follow-up as an outpatient with pulmonologist May need outpatient MRI for per radiology recommendations CT abdomen and pelvis showed bilateral adrenal nodules that are indeterminate.  Nonemergent noncontrast MRI can be done for further evaluation of this finding per radiologist recommendation    Essential hypertension Monitor BP closely, as needed hydralazine, Will Continue amlodipine, Lopressor, Avapro when tolerating p.o.   T2DM Will hold home medication of metformin and glipizide for now Hemoglobin A1c on 04/25/2023 was 6.4 Continue ISS and hypoglycemic protocol    GERD Continue Protonix   Peripheral neuropathy Continue gabapentin   Alcohol abuse POA: Alcohol level was less than 10 Patient denies any history of alcohol withdrawal symptoms Patient was counseled on alcohol abuse cessation   Tobacco abuse Patient was counseled on tobacco abuse cessation      Consults: General Surgery (By ED PA)

## 2023-07-23 NOTE — Progress Notes (Signed)
Patiennt returned from surgery , port sites intact , JP drain to RLQ     07/23/23 1224  Vitals  Temp 98.9 F (37.2 C)  BP 128/81  MAP (mmHg) 94  Pulse Rate 84  Resp (!) 21  Oxygen Therapy  SpO2 93 %  O2 Device Room Air

## 2023-07-23 NOTE — Anesthesia Procedure Notes (Signed)
Procedure Name: Intubation Date/Time: 07/23/2023 9:04 AM  Performed by: Oletha Cruel, CRNAPre-anesthesia Checklist: Patient identified, Emergency Drugs available, Suction available and Patient being monitored Patient Re-evaluated:Patient Re-evaluated prior to induction Oxygen Delivery Method: Circle system utilized Preoxygenation: Pre-oxygenation with 100% oxygen Induction Type: IV induction Ventilation: Mask ventilation without difficulty Laryngoscope Size: Mac and 4 Grade View: Grade II Tube type: Oral Tube size: 7.0 mm Number of attempts: 1 Airway Equipment and Method: Stylet Placement Confirmation: ETT inserted through vocal cords under direct vision, positive ETCO2, CO2 detector and breath sounds checked- equal and bilateral Secured at: 22 cm Tube secured with: Tape Dental Injury: Teeth and Oropharynx as per pre-operative assessment  Comments: Atraumatic intubation. Teeth/lips remain in preoperative condition.

## 2023-07-23 NOTE — Progress Notes (Signed)
David Knox  After surgery will update David Knox wife at work, David Knox [phone].   David Greenhouse, MD [facility] [address] [place] [place] [place], [place] [address] [phone] (office)

## 2023-07-23 NOTE — Op Note (Addendum)
Operative Note   Preoperative Diagnosis: Acute cholecystitis    Postoperative Diagnosis: Acute suppurative cholecystitis    Procedure(s) Performed: Laparoscopic cholecystectomy, JP in place    Surgeon: Leatrice Jewels. Henreitta Leber, MD   Assistants: No qualified resident was available   Anesthesia: General endotracheal   Anesthesiologist: Windell Norfolk, MD    Specimens: Gallbladder    Estimated Blood Loss: Minimal    Blood Replacement: 100cc    Complications: None   Class: Dirty/ Infected- purulent drainage from gallbladder    Operative Findings: Thickened edematous gallbladder with purulent drainage    Procedure: The patient was taken to the operating room and placed supine. General endotracheal anesthesia was induced. Intravenous antibiotics were administered per protocol. An orogastric tube positioned to decompress the stomach. The abdomen was prepared and draped in the usual sterile fashion.    A supraumbilical incision was made and a Veress technique was utilized to achieve pneumoperitoneum to 15 mmHg with carbon dioxide. A 11 mm optiview port was placed through the supraumbilical region, and a 10 mm 0-degree operative laparoscope was introduced. The area underlying the trocar and Veress needle were inspected and without evidence of injury.  Remaining trocars were placed under direct vision. Two 5 mm ports were placed in the right abdomen, between the anterior axillary and midclavicular line.  A final 11 mm port was placed through the mid-epigastrium, near the falciform ligament.    The gallbladder was thickened and distended. I had to open it up to decompress it. There was purulent drainage. This was suctioned out.  The gallbladder fundus was elevated cephalad and the infundibulum was retracted to the patient's right. The gallbladders infundibulum was packed with stones and difficult to grasp with a thick rind that I had to bluntly dissect down with a suction irrigator.  I was able to  do this but I had to work in a combination of top down from the dome and at the infundibulum due to the thickness of the gallbladder and difficulty in grasping the wall.  I was able to get the dome down about half way down the hepatic bed and this allowed for better mobilization. With continued blunt dissection and light cautery of each layer of the rind I was able to start to visualize the cyst duct junction and the cystic duct/ artery. These were fused together and the artery was twisting over the duct. With careful dissection I was finally able to get these two areas skeletonized. The cystic artery noted in the triangle of Calot and was also skeletonized.  I continued dissection until the critical view of safety was achieved with two structures going into the gallbladder and the hepatic plate cleared behind them.     The cystic artery was doubly clipped and divided. The cystic duct was so thick and I had to take it right at the infundibulum because the area I had cleared was short. I opted to use a EndoGIA stapler standard load and came across the duct carefully. The remaining gallbladder was then dissected from the liver bed with electrocautery. The specimen was placed in an Endopouch and was retrieved through the epigastric site.    Final inspection revealed acceptable hemostasis. Surgical SNOW was placed in the gallbladder bed.   I placed a JP drain through the lateral most port and secured it with a 3-0 Nylon. Trocars were removed and pneumoperitoneum was released.  0 Vicryl fascial sutures were used to close the epigastric and umbilical port sites. Skin incisions were closed  with staples and dressing. The patient was awakened from anesthesia and extubated without complication.   This gallbladder was difficult and required extra time and careful dissection due to the impaction of the stones and the inflammation of the gallbladder.    Algis Greenhouse, MD [facility] [address] [address] [employer], [place] [address] [phone] (office)

## 2023-07-23 NOTE — Anesthesia Preprocedure Evaluation (Signed)
Anesthesia Evaluation  Patient identified by MRN, date of birth, ID band Patient awake    Reviewed: Allergy & Precautions, H&P , NPO status , Patient's Chart, lab work & pertinent test results, reviewed documented beta blocker date and time   Airway Mallampati: II  TM Distance: >3 FB Neck ROM: full    Dental no notable dental hx.    Pulmonary neg pulmonary ROS, COPD, Current Smoker and Patient abstained from smoking.   Pulmonary exam normal breath sounds clear to auscultation       Cardiovascular Exercise Tolerance: Good hypertension, + angina  negative cardio ROS  Rhythm:regular Rate:Normal     Neuro/Psych  Neuromuscular disease negative neurological ROS  negative psych ROS   GI/Hepatic negative GI ROS, Neg liver ROS,GERD  ,,  Endo/Other  negative endocrine ROSdiabetes Hyperthyroidism   Renal/GU negative Renal ROS  negative genitourinary   Musculoskeletal   Abdominal   Peds  Hematology negative hematology ROS (+) Blood dyscrasia, anemia   Anesthesia Other Findings   Reproductive/Obstetrics negative OB ROS                             Anesthesia Physical Anesthesia Plan  ASA: 2  Anesthesia Plan: General and General ETT   Post-op Pain Management:    Induction:   PONV Risk Score and Plan: Scopolamine patch - Pre-op and Ondansetron  Airway Management Planned:   Additional Equipment:   Intra-op Plan:   Post-operative Plan:   Informed Consent: I have reviewed the patients History and Physical, chart, labs and discussed the procedure including the risks, benefits and alternatives for the proposed anesthesia with the patient or authorized representative who has indicated his/her understanding and acceptance.     Dental Advisory Given  Plan Discussed with: CRNA  Anesthesia Plan Comments:        Anesthesia Quick Evaluation

## 2023-07-23 NOTE — Transfer of Care (Signed)
//  Immediate Anesthesia Transfer of Care Note //      Patient: David Knox  Procedure(s) Performed: LAPAROSCOPIC CHOLECYSTECTOMY  Patient Location: PACU  Anesthesia Type:General  Level of Consciousness: drowsy and patient cooperative  Airway & Oxygen Therapy: Patient Spontanous Breathing and Patient connected to face mask oxygen  Post-op Assessment: Report given to RN and Post -op Vital signs reviewed and stable  Post vital signs: Reviewed and stable  Last Vitals:  Vitals Value Taken Time  BP 139/81 07/23/23 1120  Temp 97.9 07/23/23   1120  Pulse 84 07/23/23 1122  Resp 18 07/23/23 1122  SpO2 100 % 07/23/23 1122  Vitals shown include unfiled device data.  Last Pain:  Vitals:   07/23/23 0817  TempSrc: Oral  PainSc: 7       Patients Stated Pain Goal: 5 (07/23/23 0817)  Complications: No notable events documented.

## 2023-07-23 NOTE — Plan of Care (Signed)
  Problem: Education: Goal: Knowledge of General Education information will improve Description: Including pain rating scale, medication(s)/side effects and non-pharmacologic comfort measures 07/23/2023 1715 by Vergia Alcon, RN Outcome: Progressing 07/23/2023 1706 by Vergia Alcon, RN Outcome: Progressing

## 2023-07-23 NOTE — Consult Note (Addendum)
[facility] Consult  Reason for Consult: Acute cholecystitis Referring Physician: Dr. Jola Schmidt  Chief Complaint   Back Pain     HPI: David Knox is a 57 y.o. male with reported right sided pain going into his back and up into his ribs like someone is punching him. He has a history of DM, HTN, HLD, GERD, alcohol use who also had an episode of vomiting. He has had constant pain and required dilaudid overnight. He was seen by the ED and CT demonstrated concern for acute cholecystitis. They spoke to me and we discussed hospitalist admission, antibiotics, and plan for cholecystectomy today.  Past Medical History:  Diagnosis Date   Acid reflux    Diabetes mellitus without complication (HCC)    HLD (hyperlipidemia)    HTN (hypertension)     Past Surgical History:  Procedure Laterality Date   BIOPSY  01/18/2023   Procedure: BIOPSY;  Surgeon: Lanelle Bal, DO;  [facility];  Service: Endoscopy;;   COLONOSCOPY WITH PROPOFOL N/A 07/27/2022   Procedure: COLONOSCOPY WITH PROPOFOL;  Surgeon: Lanelle Bal, DO;  [facility];  Service: Endoscopy;  Laterality: N/A;  9:00 am  ASA 2   ESOPHAGOGASTRODUODENOSCOPY  2002   RMR: ulcerative reflux esophagitis, moderate sized hiatal hernia   ESOPHAGOGASTRODUODENOSCOPY (EGD) WITH PROPOFOL N/A 01/18/2023   Procedure: ESOPHAGOGASTRODUODENOSCOPY (EGD) WITH PROPOFOL;  Surgeon: Lanelle Bal, DO;  [facility];  Service: Endoscopy;  Laterality: N/A;  1:45 pm, asa 2   HIP SURGERY Bilateral    as a child   POLYPECTOMY  07/27/2022   Procedure: POLYPECTOMY;  Surgeon: Lanelle Bal, DO;  [facility];  Service: Endoscopy;;   TOOTH EXTRACTION     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   Procedure: TOTAL HIP ARTHROPLASTY;  Surgeon: Vickki Hearing, MD;  Location: [facility];  Service: Orthopedics;  Laterality: Right;   WRIST SURGERY      Family History  Problem Relation Age of  Onset   Diabetes Mother    Heart disease Mother    Diabetes Father    Heart disease Father    Colon cancer Neg Hx     Social History   Tobacco Use   Smoking status: Every Day    Current packs/day: 0.50    Types: Cigarettes   Smokeless tobacco: Never   Tobacco comments:    5 ciggs per day - 05/07/2023  Vaping Use   Vaping status: Never Used  Substance Use Topics   Alcohol use: Yes    Comment: beer and liquor twice weekly   Drug use: No    Comment: "crack" former- last about 3 years ago.    Medications: I have reviewed the patient's current medications. Prior to Admission:  Medications Prior to Admission  Medication Sig Dispense Refill Last Dose   albuterol (VENTOLIN HFA) 108 (90 Base) MCG/ACT inhaler Inhale 2 puffs into the lungs every 6 (six) hours as needed for wheezing or shortness of breath. 8 g 3 Past Month   amLODipine (NORVASC) 5 MG tablet TAKE 1 TABLET (5 MG TOTAL) BY MOUTH DAILY. 90 tablet 1 07/22/2023   atorvastatin (LIPITOR) 20 MG tablet TAKE 1 TABLET BY MOUTH EVERY DAY 90 tablet 1 07/22/2023   gabapentin (NEURONTIN) 300 MG capsule TAKE 1 CAPSULE BY MOUTH TWICE A DAY 60 capsule 5 07/22/2023   glipiZIDE (GLUCOTROL XL) 2.5 MG 24 hr tablet TAKE 1 TABLET BY MOUTH DAILY WITH BREAKFAST. 90 tablet 1 07/22/2023  ibuprofen (ADVIL) 200 MG tablet Take 400 mg by mouth 2 (two) times daily as needed for headache or moderate pain.   07/21/2023   latanoprost (XALATAN) 0.005 % ophthalmic solution Place 1 drop into both eyes at bedtime.   07/21/2023   metoprolol tartrate (LOPRESSOR) 25 MG tablet TAKE 1 TABLET BY MOUTH (2) TIMES A DAY. 180 tablet 2 07/22/2023   nitroGLYCERIN (NITROSTAT) 0.4 MG SL tablet Place 1 tablet (0.4 mg total) under the tongue every 5 (five) minutes as needed for chest pain. 10 tablet 1 NEVER   pantoprazole (PROTONIX) 40 MG tablet Take 1 tablet (40 mg total) by mouth 2 (two) times daily. 60 tablet 11 07/22/2023   blood glucose meter kit and supplies Dispense based on  patient and insurance preference. Use up to four times daily as directed. (FOR ICD-10 E10.9, E11.9). 1 each 0    metFORMIN (GLUCOPHAGE) 1000 MG tablet TAKE 1 TABLET BY MOUTH TWICE A DAY WITH MEALS (Patient not taking: Reported on 07/22/2023) 180 tablet 0 Not Taking   Scheduled:  [MAR Hold] amLODipine  5 mg Oral Daily   [MAR Hold] atorvastatin  20 mg Oral Daily   Chlorhexidine Gluconate Cloth  6 each Topical Once   And   Chlorhexidine Gluconate Cloth  6 each Topical Once   fentaNYL       [MAR Hold] gabapentin  300 mg Oral BID   [MAR Hold] heparin  5,000 Units Subcutaneous Q8H   indocyanine green  2.5 mg Intravenous Once   [MAR Hold] insulin aspart  0-9 Units Subcutaneous TID WC   [MAR Hold] irbesartan  150 mg Oral Daily   [MAR Hold] metoprolol tartrate  25 mg Oral BID   [MAR Hold] pantoprazole  40 mg Oral BID   Continuous:  cefoTEtan (CEFOTAN) IV     YQM:VHQIONGE, [MAR Hold]  HYDROmorphone (DILAUDID) injection, [MAR Hold] ondansetron **OR** [MAR Hold] ondansetron (ZOFRAN) IV  Allergies  Allergen Reactions   Asa [Aspirin] Nausea Only     ROS:  A comprehensive review of systems was negative except for: Gastrointestinal: positive for abdominal pain and nausea  Blood pressure 125/82, pulse 74, temperature 98.7 F (37.1 C), temperature source Oral, resp. rate 12, height 5\' 11"  (1.803 m), weight 89.8 kg, SpO2 98%. Physical Exam Vitals reviewed.  HENT:     Head: Normocephalic.     Nose: Nose normal.  Eyes:     Extraocular Movements: Extraocular movements intact.  Cardiovascular:     Rate and Rhythm: Normal rate.  Pulmonary:     Effort: Pulmonary effort is normal.  Abdominal:     General: There is no distension.     Palpations: Abdomen is soft.     Tenderness: There is abdominal tenderness in the right upper quadrant.  Musculoskeletal:        General: Normal range of motion.  Skin:    General: Skin is warm.  Neurological:     General: No focal deficit present.     Mental  Status: He is alert and oriented to person, place, and time.  Psychiatric:        Mood and Affect: Mood normal.        Behavior: Behavior normal.     Results: Results for orders placed or performed during the hospital encounter of 07/22/23 (from the past 48 hour(s))  CBC with Differential     Status: Abnormal   Collection Time: 07/22/23  1:11 PM  Result Value Ref Range   WBC 7.7 4.0 - 10.5  K/uL   RBC 4.19 (L) 4.22 - 5.81 MIL/uL   Hemoglobin 13.1 13.0 - 17.0 g/dL   HCT 62.1 (L) 30.8 - 65.7 %   MCV 92.4 80.0 - 100.0 fL   MCH 31.3 26.0 - 34.0 pg   MCHC 33.9 30.0 - 36.0 g/dL   RDW 84.6 96.2 - 95.2 %   Platelets 239 150 - 400 K/uL   nRBC 0.0 0.0 - 0.2 %   Neutrophils Relative % 64 %   Neutro Abs 4.9 1.7 - 7.7 K/uL   Lymphocytes Relative 20 %   Lymphs Abs 1.5 0.7 - 4.0 K/uL   Monocytes Relative 15 %   Monocytes Absolute 1.2 (H) 0.1 - 1.0 K/uL   Eosinophils Relative 1 %   Eosinophils Absolute 0.1 0.0 - 0.5 K/uL   Basophils Relative 0 %   Basophils Absolute 0.0 0.0 - 0.1 K/uL   Immature Granulocytes 0 %   Abs Immature Granulocytes 0.02 0.00 - 0.07 K/uL    Comment: Performed at [facility], [address] [place], [place] [address]  Comprehensive metabolic panel     Status: Abnormal   Collection Time: 07/22/23  1:11 PM  Result Value Ref Range   Sodium 136 135 - 145 mmol/L   Potassium 3.7 3.5 - 5.1 mmol/L   Chloride 102 98 - 111 mmol/L   CO2 25 22 - 32 mmol/L   Glucose, Bld 96 70 - 99 mg/dL    Comment: Glucose reference range applies only to samples taken after fasting for at least 8 hours.   BUN 12 6 - 20 mg/dL   Creatinine, Ser 4.40 0.61 - 1.24 mg/dL   Calcium 8.9 8.9 - 10.2 mg/dL   Total Protein 7.0 6.5 - 8.1 g/dL   Albumin 3.7 3.5 - 5.0 g/dL   AST 39 15 - 41 U/L   ALT 55 (H) 0 - 44 U/L   Alkaline Phosphatase 62 38 - 126 U/L   Total Bilirubin 0.7 0.3 - 1.2 mg/dL   GFR, Estimated >72 >53 mL/min    Comment: (NOTE) Calculated using the CKD-EPI Creatinine Equation  (2021)    Anion gap 9 5 - 15    Comment: Performed at [facility], [address] [place], [place] [address]  Lipase, blood     Status: None   Collection Time: 07/22/23  1:11 PM  Result Value Ref Range   Lipase 27 11 - 51 U/L    Comment: Performed at [facility], [address], [place], [place] [address]  Ethanol     Status: None   Collection Time: 07/22/23  1:11 PM  Result Value Ref Range   Alcohol, Ethyl (B) <10 <10 mg/dL    Comment: (NOTE) Lowest detectable limit for serum alcohol is 10 mg/dL.  For medical purposes only. Performed at [facility], [address] [place], [place] [address]   Urinalysis, Routine w reflex microscopic -Urine, Clean Catch     Status: None   Collection Time: 07/22/23  2:30 PM  Result Value Ref Range   Color, Urine YELLOW YELLOW   APPearance CLEAR CLEAR   Specific Gravity, Urine 1.023 1.005 - 1.030   pH 7.0 5.0 - 8.0   Glucose, UA NEGATIVE NEGATIVE mg/dL   Hgb urine dipstick NEGATIVE NEGATIVE   Bilirubin Urine NEGATIVE NEGATIVE   Ketones, ur NEGATIVE NEGATIVE mg/dL   Protein, ur NEGATIVE NEGATIVE mg/dL   Nitrite NEGATIVE NEGATIVE   Leukocytes,Ua NEGATIVE NEGATIVE    Comment: Performed at [facility],  [address] [place], [place] [address]  CBG monitoring, ED     Status: Abnormal   Collection Time: 07/22/23  4:58 PM  Result Value Ref Range   Glucose-Capillary 107 (H) 70 - 99 mg/dL    Comment: Glucose reference range applies only to samples taken after fasting for at least 8 hours.  Glucose, capillary     Status: Abnormal   Collection Time: 07/22/23  9:18 PM  Result Value Ref Range   Glucose-Capillary 133 (H) 70 - 99 mg/dL    Comment: Glucose reference range applies only to samples taken after fasting for at least 8 hours.  Surgical pcr screen     Status: None   Collection Time: 07/23/23 12:18 AM   Specimen: Nasal Mucosa; Nasal Swab  Result Value Ref Range   MRSA, PCR NEGATIVE NEGATIVE   Staphylococcus aureus NEGATIVE NEGATIVE     Comment: (NOTE) The Xpert SA Assay (FDA approved for NASAL specimens in patients 9 years of age and older), is one component of a comprehensive surveillance program. It is not intended to diagnose infection nor to guide or monitor treatment. Performed at [facility], [address], [place], [place] [address]   Comprehensive metabolic panel     Status: Abnormal   Collection Time: 07/23/23  5:16 AM  Result Value Ref Range   Sodium 135 135 - 145 mmol/L   Potassium 3.7 3.5 - 5.1 mmol/L   Chloride 96 (L) 98 - 111 mmol/L   CO2 27 22 - 32 mmol/L   Glucose, Bld 107 (H) 70 - 99 mg/dL    Comment: Glucose reference range applies only to samples taken after fasting for at least 8 hours.   BUN 9 6 - 20 mg/dL   Creatinine, Ser 0.98 0.61 - 1.24 mg/dL   Calcium 9.0 8.9 - 11.9 mg/dL   Total Protein 7.5 6.5 - 8.1 g/dL   Albumin 3.9 3.5 - 5.0 g/dL   AST 25 15 - 41 U/L   ALT 47 (H) 0 - 44 U/L   Alkaline Phosphatase 64 38 - 126 U/L   Total Bilirubin 0.6 0.3 - 1.2 mg/dL   GFR, Estimated >14 >78 mL/min    Comment: (NOTE) Calculated using the CKD-EPI Creatinine Equation (2021)    Anion gap 12 5 - 15    Comment: Performed at [facility], [address], [place], [place] [address]  CBC     Status: None   Collection Time: 07/23/23  5:16 AM  Result Value Ref Range   WBC 8.7 4.0 - 10.5 K/uL   RBC 4.43 4.22 - 5.81 MIL/uL   Hemoglobin 13.5 13.0 - 17.0 g/dL   HCT 13.0 86.5 - 78.4 %   MCV 92.3 80.0 - 100.0 fL   MCH 30.5 26.0 - 34.0 pg   MCHC 33.0 30.0 - 36.0 g/dL   RDW 69.6 29.5 - 28.4 %   Platelets 253 150 - 400 K/uL   nRBC 0.0 0.0 - 0.2 %    Comment: Performed at [facility], [address] [place], [place] [address]  Magnesium     Status: None   Collection Time: 07/23/23  5:16 AM  Result Value Ref Range   Magnesium 1.7 1.7 - 2.4 mg/dL    Comment: Performed at [facility], [address], [place], [place] [address]  Phosphorus     Status: None   Collection Time: 07/23/23  5:16 AM  Result  Value Ref Range   Phosphorus 3.8 2.5 - 4.6 mg/dL  Comment: Performed at [facility], [address], [place], [place] [address]  Glucose, capillary     Status: Abnormal   Collection Time: 07/23/23  8:01 AM  Result Value Ref Range   Glucose-Capillary 110 (H) 70 - 99 mg/dL    Comment: Glucose reference range applies only to samples taken after fasting for at least 8 hours.   Comment 1 Notify RN    Comment 2 Document in Chart   Glucose, capillary     Status: Abnormal   Collection Time: 07/23/23  8:16 AM  Result Value Ref Range   Glucose-Capillary 105 (H) 70 - 99 mg/dL    Comment: Glucose reference range applies only to samples taken after fasting for at least 8 hours.   Personally reviewed-  and showed patient- distended gallbladder with edema and thickening and stones  DG Chest 2 View  Result Date: 07/22/2023 CLINICAL DATA:  Right chest pain. EXAM: CHEST - 2 VIEW COMPARISON:  Chest radiograph dated 05/18/2022. FINDINGS: The heart size and mediastinal contours are within normal limits. Both lungs are clear. The visualized skeletal structures are unremarkable. IMPRESSION: No active cardiopulmonary disease. Electronically Signed   By: Romona Curls M.D.   On: 07/22/2023 14:15   CT ABDOMEN PELVIS W CONTRAST  Result Date: 07/22/2023 CLINICAL DATA:  RUQ pain, vomiting. EXAM: CT ABDOMEN AND PELVIS WITH CONTRAST TECHNIQUE: Multidetector CT imaging of the abdomen and pelvis was performed using the standard protocol following bolus administration of intravenous contrast. RADIATION DOSE REDUCTION: This exam was performed according to the departmental dose-optimization program which includes automated exposure control, adjustment of the mA and/or kV according to patient size and/or use of iterative reconstruction technique. CONTRAST:  OMNIPAQUE IOHEXOL 300 MG/ML  SOLN COMPARISON:  06/28/2005. FINDINGS: Lower chest: Linear subsegmental atelectasis left base. No pleural or pericardial effusion. Small  hiatal hernia. Hepatobiliary: Tiny subcentimeter cyst right lobe of the liver. No biliary ductal dilatation. No enhancing parenchymal lesions. The gallbladder is abnormal with calcified gallstones, wall thickening or pericholecystic fluid. Findings are concerning for cholecystitis. Consider HIDA scan for further evaluation if indicated. Pancreas: Unremarkable. No pancreatic ductal dilatation or surrounding inflammatory changes. Spleen: Normal in size without focal abnormality. Adrenals/Urinary Tract: There is an intermediate density 1.8 cm nodule in the right adrenal gland and a 9 mm similar nodule in the left adrenal gland. These are indeterminate based on density measurements on a contrast-enhanced study. Consider noncontrast MRI for further evaluation. There is a 2 cm cyst in the left kidney. No hydronephrosis or nephrolithiasis. Stomach/Bowel: Stomach is within normal limits. Appendix appears normal. No evidence of bowel wall thickening, distention, or inflammatory changes. Vascular/Lymphatic: Aortic atherosclerosis. No enlarged abdominal or pelvic lymph nodes. Reproductive: Prostate is unremarkable. Other: No abdominal wall hernia or abnormality. No abdominopelvic ascites. Musculoskeletal: No acute or significant osseous findings. Right hip prosthesis with artifact that degrades images of the pelvis. IMPRESSION: 1. Abnormal gallbladder with stones, wall thickening and pericholecystic fluid suggesting cholecystitis. This can be confirmed with a HIDA scan, if indicated. 2. Bilateral adrenal nodules that are indeterminate. Nonemergent noncontrast MRI can be done for further evaluation of this finding. 3. Tiny cysts in the liver. 4. Left kidney cyst. 5. Small hiatal hernia. Electronically Signed   By: Layla Maw M.D.   On: 07/22/2023 14:12     Assessment & Plan:  REGAL STANIFER is a 57 y.o. male with acute cholecystitis. Having pain throughout night. Labs reassuring.   PLAN: I counseled the patient  about the indication,  risks and benefits of laparoscopic cholecystectomy.  He understands there is a very small chance for bleeding, infection, injury to normal structures (including common bile duct), conversion to open surgery, persistent symptoms, evolution of postcholecystectomy diarrhea, need for secondary interventions, anesthesia reaction, cardiopulmonary issues and other risks not specifically detailed here. I described the expected recovery, the plan for follow-up and the restrictions during the recovery phase.  All questions were answered.   All questions were answered to the satisfaction of the patient.   David Knox 07/23/2023, 8:27 AM

## 2023-07-23 NOTE — Progress Notes (Signed)
Mobility Specialist Progress Note:    07/23/23 1500  Mobility  Activity Ambulated with assistance in hallway  Level of Assistance Contact guard assist, steadying assist  Assistive Device None  Distance Ambulated (ft) 75 ft  Range of Motion/Exercises Active;All extremities  Activity Response Tolerated well  Mobility Referral Yes  $Mobility charge 1 Mobility  Mobility Specialist Start Time (ACUTE ONLY) 1500  Mobility Specialist Stop Time (ACUTE ONLY) 1510  Mobility Specialist Time Calculation (min) (ACUTE ONLY) 10 min   Pt received in bed, agreeable to mobility. Required CGA to stand and ambulate with no AD. Tolerated well, asx throughout. Returned pt to room, all needs met.   Lawerance Bach Mobility Specialist Please contact via Special educational needs teacher or  Rehab office at [phone]

## 2023-07-23 NOTE — Progress Notes (Signed)
[facility]  Updated wife. Will need antibiotics for 5 days.JP drain in place. Staples. Will plan to get those out 9/26.  Diet today PRN for pain IV antibiotic while inpatient. Hopefully home tomorrow.  Algis Greenhouse, MD [facility] [address] [place], [place] [address] [phone] (office)

## 2023-07-23 NOTE — Progress Notes (Signed)
Patient received dilaudid x3 this shift. Patient pain every time has been 8/10. Pain radiates across his back and up to under his R ribs. He describes the pain as if someone is punching him. Patient has been NPO since midnight. PCR done. CHG done. Mouth care done. SCD at bedside.

## 2023-07-23 NOTE — Plan of Care (Signed)
  Problem: Education: Goal: Knowledge of General Education information will improve Description: Including pain rating scale, medication(s)/side effects and non-pharmacologic comfort measures 07/23/2023 1706 by Vergia Alcon, RN Outcome: Progressing 07/23/2023 1706 by Vergia Alcon, RN Outcome: Progressing

## 2023-07-24 ENCOUNTER — Encounter (HOSPITAL_COMMUNITY): Payer: Self-pay | Admitting: General Surgery

## 2023-07-24 DIAGNOSIS — K810 Acute cholecystitis: Secondary | ICD-10-CM | POA: Diagnosis not present

## 2023-07-24 LAB — CBC WITH DIFFERENTIAL/PLATELET
Abs Immature Granulocytes: 0.05 10*3/uL (ref 0.00–0.07)
Basophils Absolute: 0 10*3/uL (ref 0.0–0.1)
Basophils Relative: 0 %
Eosinophils Absolute: 0 10*3/uL (ref 0.0–0.5)
Eosinophils Relative: 0 %
HCT: 37.1 % — ABNORMAL LOW (ref 39.0–52.0)
Hemoglobin: 11.8 g/dL — ABNORMAL LOW (ref 13.0–17.0)
Immature Granulocytes: 1 %
Lymphocytes Relative: 19 %
Lymphs Abs: 1.5 10*3/uL (ref 0.7–4.0)
MCH: 29.8 pg (ref 26.0–34.0)
MCHC: 31.8 g/dL (ref 30.0–36.0)
MCV: 93.7 fL (ref 80.0–100.0)
Monocytes Absolute: 1.4 10*3/uL — ABNORMAL HIGH (ref 0.1–1.0)
Monocytes Relative: 18 %
Neutro Abs: 4.9 10*3/uL (ref 1.7–7.7)
Neutrophils Relative %: 62 %
Platelets: 209 10*3/uL (ref 150–400)
RBC: 3.96 MIL/uL — ABNORMAL LOW (ref 4.22–5.81)
RDW: 13.1 % (ref 11.5–15.5)
WBC: 7.8 10*3/uL (ref 4.0–10.5)
nRBC: 0 % (ref 0.0–0.2)

## 2023-07-24 LAB — BASIC METABOLIC PANEL
Anion gap: 10 (ref 5–15)
BUN: 11 mg/dL (ref 6–20)
CO2: 29 mmol/L (ref 22–32)
Calcium: 8.5 mg/dL — ABNORMAL LOW (ref 8.9–10.3)
Chloride: 97 mmol/L — ABNORMAL LOW (ref 98–111)
Creatinine, Ser: 1.23 mg/dL (ref 0.61–1.24)
GFR, Estimated: >60 mL/min (ref >60)
Glucose, Bld: 92 mg/dL (ref 70–99)
Potassium: 3.5 mmol/L (ref 3.5–5.1)
Sodium: 136 mmol/L (ref 135–145)

## 2023-07-24 LAB — HEMOGLOBIN A1C
Hgb A1c MFr Bld: 6.8 % — ABNORMAL HIGH (ref 4.8–5.6)
Mean Plasma Glucose: 148 mg/dL

## 2023-07-24 LAB — GLUCOSE, CAPILLARY
Glucose-Capillary: 89 mg/dL (ref 70–99)
Glucose-Capillary: 95 mg/dL (ref 70–99)
Glucose-Capillary: 94 mg/dL (ref 70–99)
Glucose-Capillary: 185 mg/dL — ABNORMAL HIGH (ref 70–99)

## 2023-07-24 NOTE — Progress Notes (Addendum)
[facility]  Doing well this Am. JP with SS output. No major complaints.  BP 112/72   Pulse 84   Temp 98.3 F (36.8 C) (Oral)   Resp (!) 24   Ht 5\' 11"  (1.803 m)   Wt 89.8 kg   SpO2 96%   BMI 27.61 kg/m  JP with SS output, staples covered with gauze. Abdomen soft, appropriately tender  Patient s/p lap cholecystectomy for acute suppurative cholecystitis.  Patient told RN he wanted to go later this AM.   PRN for pain Augment for 5 days post op Can go later today or tomorrow pending how he is feeling Tolerating diet JP drain   Future Appointments  Date Time Provider Department Center  08/02/2023  1:45 PM Lucretia Roers, MD [facility] None  08/22/2023 10:20 AM Anabel Halon, MD [facility]  10/26/2023 10:20 AM Mallipeddi, Orion Modest, MD [facility]    Algis Greenhouse, MD [facility] [address] [place], [place] [address] [phone] (office)

## 2023-07-24 NOTE — Plan of Care (Signed)
Problem: Activity: Goal: Risk for activity intolerance will decrease Outcome: Progressing   Problem: Nutrition: Goal: Adequate nutrition will be maintained Outcome: Progressing   Problem: Coping: Goal: Level of anxiety will decrease Outcome: Progressing   Problem: Pain Managment: Goal: General experience of comfort will improve Outcome: Progressing

## 2023-07-24 NOTE — Plan of Care (Signed)
  Problem: Education: Goal: Knowledge of General Education information will improve Description Including pain rating scale, medication(s)/side effects and non-pharmacologic comfort measures Outcome: Progressing   

## 2023-07-24 NOTE — Progress Notes (Signed)
PROGRESS NOTE    Patient: David Knox                            PCP: Anabel Halon, MD                    DOB: [birth year]            DOA: 07/22/2023 [id number]             DOS: 07/24/2023, 10:46 AM   LOS: 2 days   Date of Service: The patient was seen and examined on 07/24/2023  Subjective:   Postop day #1 Status post laparoscopic cholecystectomy with JP drain placement  The patient was seen and examined this morning, still having significant abdominal pain,  Postop day #1-procedure well, JP drain was placed JP drain draining well  Brief Narrative:   David Knox is a 57 y.o. male with medical history significant of T2DM, hypertension, hyperlipidemia, GERD, alcohol user who presents to the emergency department due to 5-day onset of right upper abdominal pain, right flank and right anterior chest wall pain which was associated with an episode of nonbilious, nonbloody vomiting yesterday in the evening and several episodes of nonbloody loose stools.  Abdominal pain was constant, nonradiating and achy in nature.  Pain was not aggravated by food or alcohol (consumes alcohol daily and denies history of alcohol withdrawal).  Patient denies fever, chills, headache, blurry vision, palpitations or any sick contact.   ED Course:  In the emergency department, BP was 137/22, other vital signs were within normal range.  Workup in the ED showed normal CBC except for hematocrit of 30.7.  BMP was normal, ALT was 55, lipase 27, alcohol level was less than 10, urinalysis was normal. CT abdomen and pelvis with contrast showed abnormal gallbladder with stones, wall thickening and pericholecystic fluid suggesting cholecystitis. Chest x-ray showed no active cardiopulmonary disease Patient was treated with IV fentanyl 50 mcg x 2 for abdominal pain, IV ceftriaxone and Flagyl were given.  General surgery (Dr. Henreitta Leber) was consulted and recommended admitting patient with plan to take patient to the  OR      Assessment & Plan:   Principal Problem:   Acute suppurative cholecystitis Active Problems:   Alcohol abuse   Tobacco abuse   Essential hypertension   GERD (gastroesophageal reflux disease)   Type 2 diabetes mellitus with diabetic neuropathy, unspecified (HCC)   Adrenal nodule (HCC)   Type 2 diabetes mellitus (HCC)        Acute cholecystitis -Postop day #1 -07/23/2023 -s/p cholecystectomy JP drain was placed- -General Surgery following -Initiated IV antibiotics  CT abdomen and pelvis was suggestive of cholecystitis -Continue IV fluids, IV antibiotics -on IV ceftriaxone and Flagyl >>  Continue IV Dilaudid 0.5 mg q.3h p.r.n. for moderate to severe pain Continue IV Zofran p.r.n.  Still having significant abdominal pain Needing IV as needed analgesics    Bilateral adrenal nodules -Stable follow-up as an outpatient with pulmonologist May need outpatient MRI for per radiology recommendations CT abdomen and pelvis showed bilateral adrenal nodules that are indeterminate.  Nonemergent noncontrast MRI can be done for further evaluation of this finding per radiologist recommendation    Essential hypertension Monitor BP closely, as needed hydralazine, Will Continue amlodipine, Lopressor, Avapro when tolerating p.o.   T2DM Will hold home medication of metformin and glipizide for now Hemoglobin A1c on 04/25/2023 was 6.4 Continue ISS and hypoglycemic protocol  GERD Continue Protonix   Peripheral neuropathy Continue gabapentin   Alcohol abuse POA: Alcohol level was less than 10 Patient denies any history of alcohol withdrawal symptoms Patient was counseled on alcohol abuse cessation   Tobacco abuse Patient was counseled on tobacco abuse cessation      Consults: General Surgery (By ED PA)    ----------------------------------------------------------------------------------------------------------------------------------------------- Nutritional  status:  The patient's BMI is: Body mass index is 27.61 kg/m. I agree with the assessment and plan as outlined ------------------------------------------------------------------------------------------------------------------------------------------------  DVT prophylaxis:  heparin injection 5,000 Units Start: 07/22/23 1615 SCDs Start: 07/22/23 1602   Code Status:   Code Status: Full Code  Family Communication: No family member present at bedside-Advance care planning has been discussed.   Admission status:   Status is: Inpatient Remains inpatient appropriate because: Needing surgical intervention, drain in place, IV antibiotics   Disposition: From  - home            From surgical standpoint due to significant abdominal pain, drainage to JP drain, needing IV antibiotics, needing to stay 1 more day... Before discharging home tomorrow    Procedures:   No admission procedures for hospital encounter.   Antimicrobials:  Anti-infectives (From admission, onward)    Start     Dose/Rate Route Frequency Ordered Stop   07/23/23 0830  cefoTEtan (CEFOTAN) 2 g in sodium chloride 0.9 % 100 mL IVPB        2 g 200 mL/hr over 30 Minutes Intravenous On call to O.R. 07/23/23 0818 07/23/23 0934   07/22/23 1545  metroNIDAZOLE (FLAGYL) IVPB 500 mg        500 mg 100 mL/hr over 60 Minutes Intravenous  Once 07/22/23 1536 07/22/23 1654   07/22/23 1500  cefTRIAXone (ROCEPHIN) 2 g in sodium chloride 0.9 % 100 mL IVPB        2 g 200 mL/hr over 30 Minutes Intravenous  Once 07/22/23 1452 07/22/23 1603        Medication:   amLODipine  5 mg Oral Daily   atorvastatin  20 mg Oral Daily   gabapentin  300 mg Oral BID   heparin  5,000 Units Subcutaneous Q8H   insulin aspart  0-9 Units Subcutaneous TID WC   irbesartan  150 mg Oral Daily   metoprolol tartrate  25 mg Oral BID   pantoprazole  40 mg Oral BID    HYDROmorphone (DILAUDID) injection, ondansetron **OR** ondansetron (ZOFRAN) IV,  oxyCODONE   Objective:   Vitals:   07/23/23 1224 07/23/23 1410 07/23/23 1954 07/24/23 0427  BP: 128/81 126/83 133/88 120/76  Pulse: 84 79 (!) 101 70  Resp: (!) 21 20 18 19   Temp: 98.9 F (37.2 C) 98.9 F (37.2 C) 100.2 F (37.9 C) 98.8 F (37.1 C)  TempSrc:  Oral Oral Oral  SpO2: 93% 96% 93% 93%  Weight:      Height:        Intake/Output Summary (Last 24 hours) at 07/24/2023 1046 Last data filed at 07/24/2023 0547 Gross per 24 hour  Intake 540 ml  Output 165 ml  Net 375 ml   Filed Weights   07/22/23 1139 07/23/23 0817  Weight: 89.8 kg 89.8 kg     Physical examination:   General:  AAO x 3,  cooperative, no distress;  Complaining abdominal pain with any movement  HEENT:  Normocephalic, PERRL, otherwise with in Normal limits   Neuro:  CNII-XII intact. , normal motor and sensation, reflexes intact   Lungs:   Clear to auscultation  BL, Respirations unlabored,  No wheezes / crackles  Cardio:    S1/S2, RRR, No murmure, No Rubs or Gallops   Abdomen:  JP drain in place, surgical wound clean, diffuse mild tenderness, Hypoactive bowel sounds Otherwise soft,    Muscular  skeletal:  Limited exam -global generalized weaknesses - in bed, able to move all 4 extremities,   2+ pulses,  symmetric, No pitting edema  Skin:  Dry, warm to touch, surgical abdominal wound clean,, JP drain in place,  Wounds: Please see nursing documentation -----------------------------------------------------------------------------------------------------------------------------------------  LABs:     Latest Ref Rng & Units 07/24/2023    6:10 AM 07/23/2023    5:16 AM 07/22/2023    1:11 PM  CBC  WBC 4.0 - 10.5 K/uL 7.8  8.7  7.7   Hemoglobin 13.0 - 17.0 g/dL 16.1  09.6  04.5   Hematocrit 39.0 - 52.0 % 37.1  40.9  38.7   Platelets 150 - 400 K/uL 209  253  239       Latest Ref Rng & Units 07/24/2023    6:10 AM 07/23/2023    5:16 AM 07/22/2023    1:11 PM  CMP  Glucose 70 - 99 mg/dL 92  409  96    BUN 6 - 20 mg/dL 11  9  12    Creatinine 0.61 - 1.24 mg/dL 8.11  9.14  7.82   Sodium 135 - 145 mmol/L 136  135  136   Potassium 3.5 - 5.1 mmol/L 3.5  3.7  3.7   Chloride 98 - 111 mmol/L 97  96  102   CO2 22 - 32 mmol/L 29  27  25    Calcium 8.9 - 10.3 mg/dL 8.5  9.0  8.9   Total Protein 6.5 - 8.1 g/dL  7.5  7.0   Total Bilirubin 0.3 - 1.2 mg/dL  0.6  0.7   Alkaline Phos 38 - 126 U/L  64  62   AST 15 - 41 U/L  25  39   ALT 0 - 44 U/L  47  55        Micro Results Recent Results (from the past 240 hour(s))  Surgical pcr screen     Status: None   Collection Time: 07/23/23 12:18 AM   Specimen: Nasal Mucosa; Nasal Swab  Result Value Ref Range Status   MRSA, PCR NEGATIVE NEGATIVE Final   Staphylococcus aureus NEGATIVE NEGATIVE Final    Comment: (NOTE) The Xpert SA Assay (FDA approved for NASAL specimens in patients 33 years of age and older), is one component of a comprehensive surveillance program. It is not intended to diagnose infection nor to guide or monitor treatment. Performed at [facility], [address], [place], [place] [address]     Radiology Reports No results found.  SIGNED: Kendell Bane, MD, FHM. FAAFP. [facility] - [facility] Time spent - 35 min.  In seeing, evaluating and examining the patient. Reviewing medical records, labs, drawn plan of care. [facility],  Pager (please use [email or web address] to page/ text) Please use Epic Secure Chat for non-urgent communication (7AM-7PM)  If 7PM-7AM, please contact night-coverage [email or web address], 07/24/2023, 10:46 AM

## 2023-07-24 NOTE — Progress Notes (Signed)
Mobility Specialist Progress Note:    07/24/23 1145  Mobility  Activity Ambulated independently in hallway  Level of Assistance Independent  Assistive Device None  Distance Ambulated (ft) 200 ft  Range of Motion/Exercises Active;All extremities  Activity Response Tolerated well  Mobility Referral Yes  $Mobility charge 1 Mobility  Mobility Specialist Start Time (ACUTE ONLY) 1145  Mobility Specialist Stop Time (ACUTE ONLY) 1200  Mobility Specialist Time Calculation (min) (ACUTE ONLY) 15 min   Pt received standing EOB, agreeable to mobility. Independently ambulates with no AD. Tolerated well, asx throughout. Returned pt to room, all needs met.   Lawerance Bach Mobility Specialist Please contact via Special educational needs teacher or  Rehab office at [phone]

## 2023-07-25 DIAGNOSIS — K810 Acute cholecystitis: Secondary | ICD-10-CM | POA: Diagnosis not present

## 2023-07-25 LAB — GLUCOSE, CAPILLARY
Glucose-Capillary: 166 mg/dL — ABNORMAL HIGH (ref 70–99)
Glucose-Capillary: 175 mg/dL — ABNORMAL HIGH (ref 70–99)

## 2023-07-25 MED ORDER — AMOXICILLIN-POT CLAVULANATE 875-125 MG PO TABS
1.0000 | ORAL_TABLET | Freq: Two times a day (BID) | ORAL | Status: DC
  Administered 2023-07-25: 1 via ORAL
  Filled 2023-07-25: qty 1

## 2023-07-25 MED ORDER — OXYCODONE HCL 5 MG PO TABS: 5.0000 mg | ORAL_TABLET | ORAL | 0 refills | Status: DC | PRN

## 2023-07-25 MED ORDER — AMOXICILLIN-POT CLAVULANATE 875-125 MG PO TABS: 1.0000 | ORAL_TABLET | Freq: Two times a day (BID) | ORAL | 0 refills | Status: AC

## 2023-07-25 MED ORDER — TELMISARTAN 40 MG PO TABS
40.0000 mg | ORAL_TABLET | Freq: Every day | ORAL | 1 refills | Status: DC
Start: 2023-07-25 — End: 2023-12-26

## 2023-07-25 NOTE — Discharge Summary (Signed)
Physician Discharge Summary  David Knox MWN:[id number] DOB: 11/19/1965 DOA: 07/22/2023  PCP: Anabel Halon, MD  Admit date: 07/22/2023  Discharge date: 07/25/2023  Admitted From:Home  Disposition:  Home  Recommendations for Outpatient Follow-up:  Follow up with PCP in 1-2 weeks Follow-up with general surgery Dr. Henreitta Leber as scheduled 9/26 Continue on Augmentin as prescribed for 5 total days Pain medications with oxycodone prescribed as needed for severe pain Continue other home medications as prior Follow-up with outpatient noncontrast abdominal MRI due to concerns for bilateral adrenal nodules that are indeterminate on CT abdomen and pelvis.  Home Health: None  Equipment/Devices: None  Discharge Condition:Stable  CODE STATUS: Full  Diet recommendation: Heart Healthy/carb modified  Brief/Interim Summary: David Knox is a 57 y.o. male with medical history significant of T2DM, hypertension, hyperlipidemia, GERD, alcohol user who presents to the emergency department due to 5-day onset of right upper abdominal pain, right flank and right anterior chest wall pain which was associated with an episode of nonbilious, nonbloody vomiting yesterday in the evening and several episodes of nonbloody loose stools.  He was admitted with diagnosis of acute cholecystitis and underwent cholecystectomy with JP drain placement on 9/16.  He appears to have tolerated the procedure well and is now in stable condition for discharge and is tolerating diet.  He will follow-up with general surgery as scheduled on 9/26 and remain on Augmentin as prescribed for 5 more days.  No other acute events or concerns noted throughout the course of this admission.  Discharge Diagnoses:  Principal Problem:   Acute suppurative cholecystitis Active Problems:   Alcohol abuse   Tobacco abuse   Essential hypertension   GERD (gastroesophageal reflux disease)   Type 2 diabetes mellitus with diabetic neuropathy,  unspecified (HCC)   Adrenal nodule (HCC)   Type 2 diabetes mellitus (HCC)  Principal discharge diagnosis: Acute cholecystitis status post laparoscopic cholecystectomy 9/16.  Discharge Instructions  Discharge Instructions     Diet - low sodium heart healthy   Complete by: As directed    Increase activity slowly   Complete by: As directed       Allergies as of 07/25/2023       Reactions   Asa [aspirin] Nausea Only        Medication List     TAKE these medications    albuterol 108 (90 Base) MCG/ACT inhaler Commonly known as: VENTOLIN HFA Inhale 2 puffs into the lungs every 6 (six) hours as needed for wheezing or shortness of breath.   amLODipine 5 MG tablet Commonly known as: NORVASC TAKE 1 TABLET (5 MG TOTAL) BY MOUTH DAILY.   amoxicillin-clavulanate 875-125 MG tablet Commonly known as: AUGMENTIN Take 1 tablet by mouth every 12 (twelve) hours for 5 days.   atorvastatin 20 MG tablet Commonly known as: LIPITOR TAKE 1 TABLET BY MOUTH EVERY DAY   blood glucose meter kit and supplies Dispense based on patient and insurance preference. Use up to four times daily as directed. (FOR ICD-10 E10.9, E11.9).   gabapentin 300 MG capsule Commonly known as: NEURONTIN TAKE 1 CAPSULE BY MOUTH TWICE A DAY   glipiZIDE 2.5 MG 24 hr tablet Commonly known as: GLUCOTROL XL TAKE 1 TABLET BY MOUTH DAILY WITH BREAKFAST.   ibuprofen 200 MG tablet Commonly known as: ADVIL Take 400 mg by mouth 2 (two) times daily as needed for headache or moderate pain.   latanoprost 0.005 % ophthalmic solution Commonly known as: XALATAN Place 1 drop into both eyes at  bedtime.   metFORMIN 1000 MG tablet Commonly known as: GLUCOPHAGE TAKE 1 TABLET BY MOUTH TWICE A DAY WITH MEALS   metoprolol tartrate 25 MG tablet Commonly known as: LOPRESSOR TAKE 1 TABLET BY MOUTH (2) TIMES A DAY.   nitroGLYCERIN 0.4 MG SL tablet Commonly known as: NITROSTAT Place 1 tablet (0.4 mg total) under the tongue  every 5 (five) minutes as needed for chest pain.   oxyCODONE 5 MG immediate release tablet Commonly known as: Oxy IR/ROXICODONE Take 1 tablet (5 mg total) by mouth every 4 (four) hours as needed for moderate pain, severe pain or breakthrough pain.   pantoprazole 40 MG tablet Commonly known as: PROTONIX Take 1 tablet (40 mg total) by mouth 2 (two) times daily.   telmisartan 40 MG tablet Commonly known as: MICARDIS Take 1 tablet (40 mg total) by mouth daily.        Follow-up Information     Lucretia Roers, MD Follow up on 08/02/2023.   Specialty: General Surgery Why: staple and JP removal Contact information: [address] [facility] [address] [phone]         Anabel Halon, MD. Go to.   Specialty: Internal Medicine Contact information: [address] [place] [address] [phone]                Allergies  Allergen Reactions   Jonne Ply [Aspirin] Nausea Only    Consultations: General Surgery   Procedures/Studies: DG Chest 2 View  Result Date: 07/22/2023 CLINICAL DATA:  Right chest pain. EXAM: CHEST - 2 VIEW COMPARISON:  Chest radiograph dated 05/18/2022. FINDINGS: The heart size and mediastinal contours are within normal limits. Both lungs are clear. The visualized skeletal structures are unremarkable. IMPRESSION: No active cardiopulmonary disease. Electronically Signed   By: Romona Curls M.D.   On: 07/22/2023 14:15   CT ABDOMEN PELVIS W CONTRAST  Result Date: 07/22/2023 CLINICAL DATA:  RUQ pain, vomiting. EXAM: CT ABDOMEN AND PELVIS WITH CONTRAST TECHNIQUE: Multidetector CT imaging of the abdomen and pelvis was performed using the standard protocol following bolus administration of intravenous contrast. RADIATION DOSE REDUCTION: This exam was performed according to the departmental dose-optimization program which includes automated exposure control, adjustment of the mA and/or kV according to patient size and/or use of iterative  reconstruction technique. CONTRAST:  OMNIPAQUE IOHEXOL 300 MG/ML  SOLN COMPARISON:  06/28/2005. FINDINGS: Lower chest: Linear subsegmental atelectasis left base. No pleural or pericardial effusion. Small hiatal hernia. Hepatobiliary: Tiny subcentimeter cyst right lobe of the liver. No biliary ductal dilatation. No enhancing parenchymal lesions. The gallbladder is abnormal with calcified gallstones, wall thickening or pericholecystic fluid. Findings are concerning for cholecystitis. Consider HIDA scan for further evaluation if indicated. Pancreas: Unremarkable. No pancreatic ductal dilatation or surrounding inflammatory changes. Spleen: Normal in size without focal abnormality. Adrenals/Urinary Tract: There is an intermediate density 1.8 cm nodule in the right adrenal gland and a 9 mm similar nodule in the left adrenal gland. These are indeterminate based on density measurements on a contrast-enhanced study. Consider noncontrast MRI for further evaluation. There is a 2 cm cyst in the left kidney. No hydronephrosis or nephrolithiasis. Stomach/Bowel: Stomach is within normal limits. Appendix appears normal. No evidence of bowel wall thickening, distention, or inflammatory changes. Vascular/Lymphatic: Aortic atherosclerosis. No enlarged abdominal or pelvic lymph nodes. Reproductive: Prostate is unremarkable. Other: No abdominal wall hernia or abnormality. No abdominopelvic ascites. Musculoskeletal: No acute or significant osseous findings. Right hip prosthesis with artifact that degrades images of the pelvis.  IMPRESSION: 1. Abnormal gallbladder with stones, wall thickening and pericholecystic fluid suggesting cholecystitis. This can be confirmed with a HIDA scan, if indicated. 2. Bilateral adrenal nodules that are indeterminate. Nonemergent noncontrast MRI can be done for further evaluation of this finding. 3. Tiny cysts in the liver. 4. Left kidney cyst. 5. Small hiatal hernia. Electronically Signed   By: Layla Maw M.D.   On: 07/22/2023 14:12     Discharge Exam: Vitals:   07/25/23 0434 07/25/23 0840  BP: 124/84 121/82  Pulse: 66 69  Resp: 18   Temp: 97.9 F (36.6 C)   SpO2: 96%    Vitals:   07/24/23 1341 07/24/23 2013 07/25/23 0434 07/25/23 0840  BP: 112/72 111/70 124/84 121/82  Pulse: 84 77 66 69  Resp: (!) 24 16 18    Temp: 98.3 F (36.8 C) 98.7 F (37.1 C) 97.9 F (36.6 C)   TempSrc: Oral Oral Oral   SpO2: 96% 96% 96%   Weight:      Height:        General: Pt is alert, awake, not in acute distress Cardiovascular: RRR, S1/S2 +, no rubs, no gallops Respiratory: CTA bilaterally, no wheezing, no rhonchi Abdominal: Soft, NT, ND, bowel sounds +, incisions C/D/I Extremities: no edema, no cyanosis    The results of significant diagnostics from this hospitalization (including imaging, microbiology, ancillary and laboratory) are listed below for reference.     Microbiology: Recent Results (from the past 240 hour(s))  Surgical pcr screen     Status: None   Collection Time: 07/23/23 12:18 AM   Specimen: Nasal Mucosa; Nasal Swab  Result Value Ref Range Status   MRSA, PCR NEGATIVE NEGATIVE Final   Staphylococcus aureus NEGATIVE NEGATIVE Final    Comment: (NOTE) The Xpert SA Assay (FDA approved for NASAL specimens in patients 60 years of age and older), is one component of a comprehensive surveillance program. It is not intended to diagnose infection nor to guide or monitor treatment. Performed at [facility], [address] [place], [place] [address]      Labs: BNP (last 3 results) No results for input(s): "BNP" in the last 8760 hours. Basic Metabolic Panel: Recent Labs  Lab 07/22/23 1311 07/23/23 0516 07/24/23 0610 07/25/23 0425  NA 136 135 136 136  K 3.7 3.7 3.5 3.5  CL 102 96* 97* 98  CO2 25 27 29 28   GLUCOSE 96 107* 92 87  BUN 12 9 11 10   CREATININE 0.94 1.07 1.23 1.11  CALCIUM 8.9 9.0 8.5* 8.4*  MG  --  1.7  --   --   PHOS  --  3.8  --   --     Liver Function Tests: Recent Labs  Lab 07/22/23 1311 07/23/23 0516  AST 39 25  ALT 55* 47*  ALKPHOS 62 64  BILITOT 0.7 0.6  PROT 7.0 7.5  ALBUMIN 3.7 3.9   Recent Labs  Lab 07/22/23 1311  LIPASE 27   No results for input(s): "AMMONIA" in the last 168 hours. CBC: Recent Labs  Lab 07/22/23 1311 07/23/23 0516 07/24/23 0610  WBC 7.7 8.7 7.8  NEUTROABS 4.9  --  4.9  HGB 13.1 13.5 11.8*  HCT 38.7* 40.9 37.1*  MCV 92.4 92.3 93.7  PLT 239 253 209   Cardiac Enzymes: No results for input(s): "CKTOTAL", "CKMB", "CKMBINDEX", "TROPONINI" in the last 168 hours. BNP: Invalid input(s): "POCBNP" CBG: Recent Labs  Lab 07/24/23 0701 07/24/23 1149 07/24/23 1702 07/24/23 2026 07/25/23 0755  GLUCAP  89 95 94 185* 166*   D-Dimer No results for input(s): "DDIMER" in the last 72 hours. Hgb A1c Recent Labs    07/22/23 1608  HGBA1C 6.8*   Lipid Profile No results for input(s): "CHOL", "HDL", "LDLCALC", "TRIG", "CHOLHDL", "LDLDIRECT" in the last 72 hours. Thyroid function studies No results for input(s): "TSH", "T4TOTAL", "T3FREE", "THYROIDAB" in the last 72 hours.  Invalid input(s): "FREET3" Anemia work up No results for input(s): "VITAMINB12", "FOLATE", "FERRITIN", "TIBC", "IRON", "RETICCTPCT" in the last 72 hours. Urinalysis    Component Value Date/Time   COLORURINE YELLOW 07/22/2023 1430   APPEARANCEUR CLEAR 07/22/2023 1430   LABSPEC 1.023 07/22/2023 1430   PHURINE 7.0 07/22/2023 1430   GLUCOSEU NEGATIVE 07/22/2023 1430   HGBUR NEGATIVE 07/22/2023 1430   BILIRUBINUR NEGATIVE 07/22/2023 1430   BILIRUBINUR negative 08/21/2022 1106   KETONESUR NEGATIVE 07/22/2023 1430   PROTEINUR NEGATIVE 07/22/2023 1430   UROBILINOGEN 0.2 08/21/2022 1106   NITRITE NEGATIVE 07/22/2023 1430   LEUKOCYTESUR NEGATIVE 07/22/2023 1430   Sepsis Labs Recent Labs  Lab 07/22/23 1311 07/23/23 0516 07/24/23 0610  WBC 7.7 8.7 7.8   Microbiology Recent Results (from the past 240  hour(s))  Surgical pcr screen     Status: None   Collection Time: 07/23/23 12:18 AM   Specimen: Nasal Mucosa; Nasal Swab  Result Value Ref Range Status   MRSA, PCR NEGATIVE NEGATIVE Final   Staphylococcus aureus NEGATIVE NEGATIVE Final    Comment: (NOTE) The Xpert SA Assay (FDA approved for NASAL specimens in patients 59 years of age and older), is one component of a comprehensive surveillance program. It is not intended to diagnose infection nor to guide or monitor treatment. Performed at [facility], [address] [place], [place] [address]      Time coordinating discharge: 35 minutes  SIGNED:   Erick Blinks, DO [facility] 07/25/2023, 10:56 AM  If 7PM-7AM, please contact night-coverage [email or web address]

## 2023-07-26 ENCOUNTER — Telehealth: Payer: Self-pay

## 2023-07-26 NOTE — Transitions of Care (Post Inpatient/ED Visit) (Signed)
07/26/2023  Name: David Knox MRN: [id number] DOB: Aug 14, 1966  Today's TOC FU Call Status: Today's TOC FU Call Status:: Successful TOC FU Call Completed TOC FU Call Complete Date: 07/26/23 Patient's Name and Date of Birth confirmed.  Transition Care Management Follow-up Telephone Call Date of Discharge: 07/25/23 [facility] Type of Discharge: Inpatient Admission Primary Inpatient Discharge Diagnosis:: cholecystitis How have you been since you were released from the hospital?: Better Any questions or concerns?: No  Items Reviewed: Did you receive and understand the discharge instructions provided?: Yes Medications obtained,verified, and reconciled?: Yes (Medications Reviewed) Any new allergies since your discharge?: No Dietary orders reviewed?: Yes Do you have support at home?: Yes People in Home: spouse  Medications Reviewed Today: Medications Reviewed Today     Reviewed by Karena Addison, LPN (Licensed Practical Nurse) on 07/26/23 at 1301  Med List Status: <None>   Medication Order Taking? Sig Documenting Provider Last Dose Status Informant  albuterol (VENTOLIN HFA) 108 (90 Base) MCG/ACT inhaler [id number] No Inhale 2 puffs into the lungs every 6 (six) hours as needed for wheezing or shortness of breath. Anabel Halon, MD Past Month Active Self, Pharmacy Records  amLODipine (NORVASC) 5 MG tablet [id number] No TAKE 1 TABLET (5 MG TOTAL) BY MOUTH DAILY. Anabel Halon, MD 07/22/2023 Active Self, Pharmacy Records  amoxicillin-clavulanate (AUGMENTIN) 875-125 MG tablet [id number]  Take 1 tablet by mouth every 12 (twelve) hours for 5 days. Sherryll Burger, Pratik D, DO  Active   atorvastatin (LIPITOR) 20 MG tablet [id number] No TAKE 1 TABLET BY MOUTH EVERY DAY Anabel Halon, MD 07/22/2023 Active Self, Pharmacy Records  blood glucose meter kit and supplies [id number] No Dispense based on patient and insurance preference. Use up to four times daily as directed. (FOR  ICD-10 E10.9, E11.9). Gardenia Phlegm, MD Taking Active Self, Pharmacy Records  gabapentin (NEURONTIN) 300 MG capsule [id number] No TAKE 1 CAPSULE BY MOUTH TWICE A DAY Anabel Halon, MD 07/22/2023 Active Self, Pharmacy Records  glipiZIDE (GLUCOTROL XL) 2.5 MG 24 hr tablet [id number] No TAKE 1 TABLET BY MOUTH DAILY WITH BREAKFAST. Anabel Halon, MD 07/22/2023 Active Self, Pharmacy Records  ibuprofen (ADVIL) 200 MG tablet [id number] No Take 400 mg by mouth 2 (two) times daily as needed for headache or moderate pain. [provider] 07/21/2023 Active Self  latanoprost (XALATAN) 0.005 % ophthalmic solution [id number] No Place 1 drop into both eyes at bedtime. [provider] 07/21/2023 Active Self, Pharmacy Records  metFORMIN (GLUCOPHAGE) 1000 MG tablet [id number] No TAKE 1 TABLET BY MOUTH TWICE A DAY WITH MEALS  Patient not taking: Reported on 07/22/2023   Anabel Halon, MD Not Taking Active Self, Pharmacy Records  metoprolol tartrate (LOPRESSOR) 25 MG tablet [id number] No TAKE 1 TABLET BY MOUTH (2) TIMES A DAY. Mallipeddi, Vishnu P, MD 07/22/2023 Active Self, Pharmacy Records  nitroGLYCERIN (NITROSTAT) 0.4 MG SL tablet [id number] No Place 1 tablet (0.4 mg total) under the tongue every 5 (five) minutes as needed for chest pain. Anabel Halon, MD NEVER Active Self, Pharmacy Records           Med Note Daphine Deutscher, [place]   Thu Jan 25, 2023  9:56 AM)    oxyCODONE (OXY IR/ROXICODONE) 5 MG immediate release tablet [id number]  Take 1 tablet (5 mg total) by mouth every 4 (four) hours as needed for moderate pain, severe pain or breakthrough pain. Sherryll Burger, Pratik D, DO  Active   pantoprazole (PROTONIX) 40 MG tablet [id number] No  Take 1 tablet (40 mg total) by mouth 2 (two) times daily. Lanelle Bal, DO 07/22/2023 Active Self, Pharmacy Records  telmisartan (MICARDIS) 40 MG tablet [id number]  Take 1 tablet (40 mg total) by mouth daily. Maurilio Lovely D, DO  Active             Home Care and  Equipment/Supplies: Were Home Health Services Ordered?: NA Any new equipment or medical supplies ordered?: NA  Functional Questionnaire: Do you need assistance with bathing/showering or dressing?: No Do you need assistance with meal preparation?: No Do you need assistance with eating?: No Do you have difficulty maintaining continence: No Do you need assistance with getting out of bed/getting out of a chair/moving?: No Do you have difficulty managing or taking your medications?: No  Follow up appointments reviewed: PCP Follow-up appointment confirmed?: Yes Date of PCP follow-up appointment?: 08/07/23 Follow-up Provider: [facility] Follow-up appointment confirmed?: Yes Date of Specialist follow-up appointment?: 08/02/23 Follow-Up Specialty Provider:: surgeon Do you need transportation to your follow-up appointment?: No Do you understand care options if your condition(s) worsen?: Yes-patient verbalized understanding    SIGNATURE Karena Addison, LPN [facility] Nurse Health Advisor Direct Dial [phone]

## 2023-07-28 NOTE — Anesthesia Postprocedure Evaluation (Signed)
Anesthesia Post Note  Patient: David Knox  Procedure(s) Performed: LAPAROSCOPIC CHOLECYSTECTOMY  Patient location during evaluation: Phase II Anesthesia Type: General Level of consciousness: awake Pain management: pain level controlled Vital Signs Assessment: post-procedure vital signs reviewed and stable Respiratory status: spontaneous breathing and respiratory function stable Cardiovascular status: blood pressure returned to baseline and stable Postop Assessment: no headache and no apparent nausea or vomiting Anesthetic complications: no Comments: Late entry   No notable events documented.   Last Vitals:  Vitals:   07/25/23 0434 07/25/23 0840  BP: 124/84 121/82  Pulse: 66 69  Resp: 18   Temp: 36.6 C   SpO2: 96%     Last Pain:  Vitals:   07/25/23 1000  TempSrc:   PainSc: 0-No pain                 Windell Norfolk

## 2023-07-30 ENCOUNTER — Emergency Department (HOSPITAL_COMMUNITY)
Admission: EM | Admit: 2023-07-30 | Discharge: 2023-07-30 | Discharge status: Against Medical Advice | Payer: Medicaid Other | Attending: Emergency Medicine | Admitting: Emergency Medicine

## 2023-07-30 DIAGNOSIS — Z4803 Encounter for change or removal of drains: Secondary | ICD-10-CM | POA: Diagnosis present

## 2023-07-30 DIAGNOSIS — Z5321 Procedure and treatment not carried out due to patient leaving prior to being seen by health care provider: Secondary | ICD-10-CM | POA: Diagnosis not present

## 2023-08-02 ENCOUNTER — Encounter: Payer: Self-pay | Admitting: General Surgery

## 2023-08-02 ENCOUNTER — Ambulatory Visit: Payer: Medicaid Other | Admitting: General Surgery

## 2023-08-02 VITALS — BP 127/81 | HR 54 | Temp 98.0°F | Resp 14 | Ht 71.0 in | Wt 188.0 lb

## 2023-08-02 DIAGNOSIS — K810 Acute cholecystitis: Secondary | ICD-10-CM

## 2023-08-02 NOTE — Progress Notes (Signed)
[facility]  Patient feeling better. JP with < 20cc a day that is SS.   BP 127/81   Pulse (!) 54   Temp 98 F (36.7 C) (Oral)   Resp 14   Ht 5\' 11"  (1.803 m)   Wt 188 lb (85.3 kg)   SpO2 97%   BMI 26.22 kg/m  JP with serous output, staples removed and incisions c/d/I with no erythema or drainage JP removed, dressing placed Steri strips placed    A. GALLBLADDER, CHOLECYSTECTOMY:  - Acute on chronic cholecystitis with cholelithiasis   Patient s/p lap cholecystectomy for acute cholecystitis. Doing well.   Leave the dressing in place for 48 hours. Steristrips will peel off in the next 5-7 days. You can remove them once they are peeling off. It is ok to shower. Pat the area dry.  Diet and activity as tolerated  Future Appointments  Date Time Provider Department Center  08/07/2023  2:20 PM Anabel Halon, MD [facility]  08/22/2023 10:20 AM Anabel Halon, MD [facility]  10/26/2023 10:20 AM Mallipeddi, Orion Modest, MD [facility]   Algis Greenhouse, MD [facility] [address] [place], [place] [address] [phone] (office)

## 2023-08-02 NOTE — Patient Instructions (Signed)
Leave the dressing in place for 48 hours. Steristrips will peel off in the next 5-7 days. You can remove them once they are peeling off. It is ok to shower. Pat the area dry.  Diet and activity as tolerated

## 2023-08-07 ENCOUNTER — Inpatient Hospital Stay: Payer: Medicaid Other | Admitting: Internal Medicine

## 2023-08-08 ENCOUNTER — Other Ambulatory Visit: Payer: Self-pay | Admitting: Internal Medicine

## 2023-08-08 DIAGNOSIS — G629 Polyneuropathy, unspecified: Secondary | ICD-10-CM

## 2023-08-10 ENCOUNTER — Encounter: Payer: Self-pay | Admitting: Internal Medicine

## 2023-08-14 LAB — CMP14+EGFR
ALT: 44 [IU]/L (ref 0–44)
AST: 25 [IU]/L (ref 0–40)
Albumin: 4.1 g/dL (ref 3.8–4.9)
Alkaline Phosphatase: 102 [IU]/L (ref 44–121)
BUN/Creatinine Ratio: 11 (ref 9–20)
BUN: 12 mg/dL (ref 6–24)
Bilirubin Total: 0.2 mg/dL (ref 0.0–1.2)
CO2: 22 mmol/L (ref 20–29)
Calcium: 9.6 mg/dL (ref 8.7–10.2)
Chloride: 103 mmol/L (ref 96–106)
Creatinine, Ser: 1.08 mg/dL (ref 0.76–1.27)
Globulin, Total: 2.7 g/dL (ref 1.5–4.5)
Glucose: 206 mg/dL — ABNORMAL HIGH (ref 70–99)
Potassium: 4.1 mmol/L (ref 3.5–5.2)
Sodium: 140 mmol/L (ref 134–144)
Total Protein: 6.8 g/dL (ref 6.0–8.5)
eGFR: 81 mL/min/{1.73_m2} (ref >59)

## 2023-08-14 LAB — TSH+FREE T4
Free T4: 0.97 ng/dL (ref 0.82–1.77)
TSH: 0.436 u[IU]/mL — ABNORMAL LOW (ref 0.450–4.500)

## 2023-08-14 LAB — CBC WITH DIFFERENTIAL/PLATELET
Basophils Absolute: 0 10*3/uL (ref 0.0–0.2)
Basos: 1 %
EOS (ABSOLUTE): 0.2 10*3/uL (ref 0.0–0.4)
Eos: 3 %
Hematocrit: 41.5 % (ref 37.5–51.0)
Hemoglobin: 13.2 g/dL (ref 13.0–17.7)
Immature Grans (Abs): 0 10*3/uL (ref 0.0–0.1)
Immature Granulocytes: 0 %
Lymphocytes Absolute: 1.9 10*3/uL (ref 0.7–3.1)
Lymphs: 25 %
MCH: 30.3 pg (ref 26.6–33.0)
MCHC: 31.8 g/dL (ref 31.5–35.7)
MCV: 95 fL (ref 79–97)
Monocytes Absolute: 0.6 10*3/uL (ref 0.1–0.9)
Monocytes: 8 %
Neutrophils Absolute: 4.8 10*3/uL (ref 1.4–7.0)
Neutrophils: 63 %
Platelets: 309 10*3/uL (ref 150–450)
RBC: 4.35 x10E6/uL (ref 4.14–5.80)
RDW: 12.6 % (ref 11.6–15.4)
WBC: 7.7 10*3/uL (ref 3.4–10.8)

## 2023-08-14 LAB — HEMOGLOBIN A1C
Est. average glucose Bld gHb Est-mCnc: 157 mg/dL
Hgb A1c MFr Bld: 7.1 % — ABNORMAL HIGH (ref 4.8–5.6)

## 2023-08-14 LAB — LIPID PANEL
Chol/HDL Ratio: 3.8 {ratio} (ref 0.0–5.0)
Cholesterol, Total: 103 mg/dL (ref 100–199)
HDL: 27 mg/dL — ABNORMAL LOW (ref >39)
LDL Chol Calc (NIH): 27 mg/dL (ref 0–99)
Triglycerides: 335 mg/dL — ABNORMAL HIGH (ref 0–149)
VLDL Cholesterol Cal: 49 mg/dL — ABNORMAL HIGH (ref 5–40)

## 2023-08-14 LAB — VITAMIN D 25 HYDROXY (VIT D DEFICIENCY, FRACTURES): Vit D, 25-Hydroxy: 16.2 ng/mL — ABNORMAL LOW (ref 30.0–100.0)

## 2023-08-14 LAB — PSA: Prostate Specific Ag, Serum: 0.7 ng/mL (ref 0.0–4.0)

## 2023-08-22 ENCOUNTER — Ambulatory Visit (INDEPENDENT_AMBULATORY_CARE_PROVIDER_SITE_OTHER): Payer: Medicaid Other | Admitting: Internal Medicine

## 2023-08-22 ENCOUNTER — Encounter: Payer: Self-pay | Admitting: Gastroenterology

## 2023-08-22 ENCOUNTER — Encounter: Payer: Self-pay | Admitting: Internal Medicine

## 2023-08-22 VITALS — BP 123/79 | HR 74 | Ht 71.0 in | Wt 192.0 lb

## 2023-08-22 DIAGNOSIS — Z0001 Encounter for general adult medical examination with abnormal findings: Secondary | ICD-10-CM | POA: Diagnosis not present

## 2023-08-22 DIAGNOSIS — E782 Mixed hyperlipidemia: Secondary | ICD-10-CM

## 2023-08-22 DIAGNOSIS — E559 Vitamin D deficiency, unspecified: Secondary | ICD-10-CM

## 2023-08-22 DIAGNOSIS — N401 Enlarged prostate with lower urinary tract symptoms: Secondary | ICD-10-CM | POA: Insufficient documentation

## 2023-08-22 DIAGNOSIS — Z7984 Long term (current) use of oral hypoglycemic drugs: Secondary | ICD-10-CM

## 2023-08-22 DIAGNOSIS — I10 Essential (primary) hypertension: Secondary | ICD-10-CM | POA: Diagnosis not present

## 2023-08-22 DIAGNOSIS — E1140 Type 2 diabetes mellitus with diabetic neuropathy, unspecified: Secondary | ICD-10-CM | POA: Diagnosis not present

## 2023-08-22 DIAGNOSIS — R350 Frequency of micturition: Secondary | ICD-10-CM

## 2023-08-22 DIAGNOSIS — G629 Polyneuropathy, unspecified: Secondary | ICD-10-CM

## 2023-08-22 MED ORDER — TAMSULOSIN HCL 0.4 MG PO CAPS
0.4000 mg | ORAL_CAPSULE | Freq: Every day | ORAL | 3 refills | Status: DC
Start: 2023-08-22 — End: 2023-09-18

## 2023-08-22 MED ORDER — GABAPENTIN 400 MG PO CAPS
400.0000 mg | ORAL_CAPSULE | Freq: Two times a day (BID) | ORAL | 5 refills | Status: DC
Start: 2023-08-22 — End: 2024-03-21

## 2023-08-22 MED ORDER — GLIPIZIDE ER 5 MG PO TB24
5.0000 mg | ORAL_TABLET | Freq: Every day | ORAL | 1 refills | Status: DC
Start: 2023-08-22 — End: 2023-12-06

## 2023-08-22 NOTE — Assessment & Plan Note (Signed)

## 2023-08-22 NOTE — Assessment & Plan Note (Addendum)
Uncontrolled with Gabapentin to 300 mg BID, increased dose to 400 mg twice daily If persistent, will refer to Neurology

## 2023-08-22 NOTE — Assessment & Plan Note (Addendum)
BP Readings from Last 1 Encounters:  08/22/23 123/79   Well-controlled with Amlodipine 5 mg QD, Telmisartan 40  mg QD and metoprolol 25 mg BID Counseled for compliance with the medications Advised DASH diet and moderate exercise/walking, at least 150 mins/week

## 2023-08-22 NOTE — Assessment & Plan Note (Addendum)
Lab Results  Component Value Date   HGBA1C 7.1 (H) 08/13/2023   Uncontrolled Did not tolerate metformin On glipizide 2.5 mg once daily now, increased dose to 5 mg QD Advised to follow diabetic diet On statin and ARB F/u CMP and lipid panel Diabetic eye exam: Advised to follow up with Ophthalmology for diabetic eye exam  On gabapentin 300 mg twice daily for neuropathy, increase dose to 400 mg BID due to persistent symptoms If persistent, will refer to Neurology

## 2023-08-22 NOTE — Assessment & Plan Note (Signed)
His urinary symptoms are likely due to BPH Started Flomax 0.4 mg nightly

## 2023-08-22 NOTE — Assessment & Plan Note (Signed)
Checked lipid profile On Atorvastatin 20 mg QD

## 2023-08-22 NOTE — Assessment & Plan Note (Signed)
Last vitamin D Lab Results  Component Value Date   VD25OH 16.2 (L) 08/13/2023   Advised to take Vitamin D 5000 IU QD

## 2023-08-22 NOTE — Progress Notes (Signed)
Established Patient Office Visit  Subjective:  Patient ID: LUCIA SCHROETER, male    DOB: 08/14/1966  Age: 57 y.o. MRN: [id number]  CC:  Chief Complaint  Patient presents with   Annual Exam    HPI HANIF YANNUZZI is a 57 y.o. male with past medical history of HTN, type 2 DM, peripheral neuropathy, GERD, hip arthritis and tobacco abuse who presents for annual physical.  HTN: BP is well-controlled. Takes medications regularly.  He had cardiology evaluation for episodes of chest pain.  He was placed on metoprolol 25 mg BID by cardiologist.  He denies any recent episodes of chest pain.  Patient denies headache, dizziness, or palpitations.   Type II DM: His HbA1c has worsened to 7.1 now.  He takes glipizide to 2.5 mg QD.  He did not tolerate metformin.  He denies any polyuria or polyphagia currently.  He still complains of bilateral feet burning pain despite taking gabapentin 300 mg twice daily.  He had [place] ABI screening, which showed - Normal bilateral resting ankle-brachial indices. He denies any claudication symptoms.  He reports urinary frequency and nocturia.  Denies any dysuria or hematuria.  He feels that he is not able to empty his bladder completely.  Has noticed postvoid dribbling at times.  His PSA is WNL.  He recently had laparoscopic cholecystectomy for cholecystitis.  He currently denies any abdominal pain, nausea or vomiting.  Past Medical History:  Diagnosis Date   Acid reflux    Diabetes mellitus without complication (HCC)    HLD (hyperlipidemia)    HTN (hypertension)     Past Surgical History:  Procedure Laterality Date   BIOPSY  01/18/2023   Procedure: BIOPSY;  Surgeon: Lanelle Bal, DO;  Location: [facility];  Service: Endoscopy;;   CHOLECYSTECTOMY N/A 07/23/2023   Procedure: LAPAROSCOPIC CHOLECYSTECTOMY;  Surgeon: Lucretia Roers, MD;  Location: [facility];  Service: General;  Laterality: N/A;   COLONOSCOPY WITH PROPOFOL N/A 07/27/2022   Procedure:  COLONOSCOPY WITH PROPOFOL;  Surgeon: Lanelle Bal, DO;  Location: [facility];  Service: Endoscopy;  Laterality: N/A;  9:00 am  ASA 2   ESOPHAGOGASTRODUODENOSCOPY  2002   RMR: ulcerative reflux esophagitis, moderate sized hiatal hernia   ESOPHAGOGASTRODUODENOSCOPY (EGD) WITH PROPOFOL N/A 01/18/2023   Procedure: ESOPHAGOGASTRODUODENOSCOPY (EGD) WITH PROPOFOL;  Surgeon: Lanelle Bal, DO;  Location: [facility];  Service: Endoscopy;  Laterality: N/A;  1:45 pm, asa 2   HIP SURGERY Bilateral    as a child   POLYPECTOMY  07/27/2022   Procedure: POLYPECTOMY;  Surgeon: Lanelle Bal, DO;  Location: [facility];  Service: Endoscopy;;   TOOTH EXTRACTION     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   Procedure: TOTAL HIP ARTHROPLASTY;  Surgeon: Vickki Hearing, MD;  Location: [facility];  Service: Orthopedics;  Laterality: Right;   WRIST SURGERY      Family History  Problem Relation Age of Onset   Diabetes Mother    Heart disease Mother    Diabetes Father    Heart disease Father    Colon cancer Neg Hx     Social History   Socioeconomic History   Marital status: Single    Spouse name: Not on file   Number of children: Not on file   Years of education: Not on file   Highest education level: Not on file  Occupational History   Not on file  Tobacco Use   Smoking status: Every Day  Current packs/day: 0.50    Types: Cigarettes   Smokeless tobacco: Never   Tobacco comments:    5 ciggs per day - 05/07/2023  Vaping Use   Vaping status: Never Used  Substance and Sexual Activity   Alcohol use: Yes    Comment: beer and liquor twice weekly   Drug use: No    Comment: "crack" former- last about 3 years ago.   Sexual activity: Not on file  Other Topics Concern   Not on file  Social History Narrative   Not on file   Social Determinants of Health   Financial Resource Strain: Not on file  Food Insecurity: No Food Insecurity (07/22/2023)   Hunger Vital Sign    Worried  About Running Out of Food in the Last Year: Never true    Ran Out of Food in the Last Year: Never true  Transportation Needs: No Transportation Needs (07/22/2023)   PRAPARE - Administrator, Civil Service (Medical): No    Lack of Transportation (Non-Medical): No  Physical Activity: Not on file  Stress: Not on file  Social Connections: Not on file  Intimate Partner Violence: Not At Risk (07/22/2023)   Humiliation, Afraid, Rape, and Kick questionnaire    Fear of Current or Ex-Partner: No    Emotionally Abused: No    Physically Abused: No    Sexually Abused: No    Outpatient Medications Prior to Visit  Medication Sig Dispense Refill   albuterol (VENTOLIN HFA) 108 (90 Base) MCG/ACT inhaler Inhale 2 puffs into the lungs every 6 (six) hours as needed for wheezing or shortness of breath. 8 g 3   amLODipine (NORVASC) 5 MG tablet TAKE 1 TABLET (5 MG TOTAL) BY MOUTH DAILY. 90 tablet 1   atorvastatin (LIPITOR) 20 MG tablet TAKE 1 TABLET BY MOUTH EVERY DAY 90 tablet 1   blood glucose meter kit and supplies Dispense based on patient and insurance preference. Use up to four times daily as directed. (FOR ICD-10 E10.9, E11.9). 1 each 0   ibuprofen (ADVIL) 200 MG tablet Take 400 mg by mouth 2 (two) times daily as needed for headache or moderate pain.     latanoprost (XALATAN) 0.005 % ophthalmic solution Place 1 drop into both eyes at bedtime.     metoprolol tartrate (LOPRESSOR) 25 MG tablet TAKE 1 TABLET BY MOUTH (2) TIMES A DAY. 180 tablet 2   nitroGLYCERIN (NITROSTAT) 0.4 MG SL tablet Place 1 tablet (0.4 mg total) under the tongue every 5 (five) minutes as needed for chest pain. 10 tablet 1   pantoprazole (PROTONIX) 40 MG tablet Take 1 tablet (40 mg total) by mouth 2 (two) times daily. 60 tablet 11   telmisartan (MICARDIS) 40 MG tablet Take 1 tablet (40 mg total) by mouth daily. 90 tablet 1   gabapentin (NEURONTIN) 300 MG capsule TAKE 1 CAPSULE BY MOUTH TWICE A DAY 60 capsule 5   glipiZIDE  (GLUCOTROL XL) 2.5 MG 24 hr tablet TAKE 1 TABLET BY MOUTH DAILY WITH BREAKFAST. 90 tablet 1   metFORMIN (GLUCOPHAGE) 1000 MG tablet TAKE 1 TABLET BY MOUTH TWICE A DAY WITH MEALS 180 tablet 0   oxyCODONE (OXY IR/ROXICODONE) 5 MG immediate release tablet Take 1 tablet (5 mg total) by mouth every 4 (four) hours as needed for moderate pain, severe pain or breakthrough pain. 15 tablet 0   No facility-administered medications prior to visit.    Allergies  Allergen Reactions   Asa [Aspirin] Nausea Only  ROS Review of Systems  Constitutional:  Negative for chills and fever.  HENT:  Negative for congestion and sore throat.   Eyes:  Positive for visual disturbance. Negative for pain and discharge.  Respiratory:  Negative for cough and shortness of breath.   Cardiovascular:  Negative for chest pain and palpitations.  Gastrointestinal:  Negative for nausea and vomiting.  Endocrine: Negative for polydipsia and polyuria.  Genitourinary:  Negative for dysuria and hematuria.  Musculoskeletal:  Positive for arthralgias and back pain. Negative for neck pain and neck stiffness.  Skin:  Negative for rash.  Neurological:  Positive for numbness (In left leg and feet). Negative for dizziness, weakness and headaches.  Psychiatric/Behavioral:  Negative for agitation and behavioral problems.       Objective:    Physical Exam Vitals reviewed.  Constitutional:      General: He is not in acute distress.    Appearance: He is not diaphoretic.  HENT:     Head: Normocephalic and atraumatic.     Nose: Nose normal.     Mouth/Throat:     Mouth: Mucous membranes are moist.  Eyes:     General: No scleral icterus.    Extraocular Movements: Extraocular movements intact.  Neck:     Vascular: No carotid bruit.  Cardiovascular:     Rate and Rhythm: Normal rate and regular rhythm.     Pulses: Normal pulses.     Heart sounds: Normal heart sounds. No murmur heard. Pulmonary:     Breath sounds: Normal breath  sounds. No wheezing or rales.  Abdominal:     Palpations: Abdomen is soft.     Tenderness: There is no abdominal tenderness.  Musculoskeletal:     Cervical back: Neck supple. No tenderness.     Right lower leg: No edema.     Left lower leg: No edema.  Skin:    General: Skin is warm.     Findings: No rash.  Neurological:     General: No focal deficit present.     Mental Status: He is alert and oriented to person, place, and time.     Cranial Nerves: No cranial nerve deficit.     Motor: No weakness.  Psychiatric:        Mood and Affect: Mood normal.        Behavior: Behavior normal.     BP 123/79 (BP Location: Right Arm, Patient Position: Sitting, Cuff Size: Normal)   Pulse 74   Ht 5\' 11"  (1.803 m)   Wt 192 lb (87.1 kg)   SpO2 94%   BMI 26.78 kg/m  Wt Readings from Last 3 Encounters:  08/22/23 192 lb (87.1 kg)  08/02/23 188 lb (85.3 kg)  07/23/23 197 lb 15.6 oz (89.8 kg)    Lab Results  Component Value Date   TSH 0.436 (L) 08/13/2023   Lab Results  Component Value Date   WBC 7.7 08/13/2023   HGB 13.2 08/13/2023   HCT 41.5 08/13/2023   MCV 95 08/13/2023   PLT 309 08/13/2023   Lab Results  Component Value Date   NA 140 08/13/2023   K 4.1 08/13/2023   CO2 22 08/13/2023   GLUCOSE 206 (H) 08/13/2023   BUN 12 08/13/2023   CREATININE 1.08 08/13/2023   BILITOT 0.2 08/13/2023   ALKPHOS 102 08/13/2023   AST 25 08/13/2023   ALT 44 08/13/2023   PROT 6.8 08/13/2023   ALBUMIN 4.1 08/13/2023   CALCIUM 9.6 08/13/2023   ANIONGAP 10 07/25/2023  EGFR 81 08/13/2023   Lab Results  Component Value Date   CHOL 103 08/13/2023   Lab Results  Component Value Date   HDL 27 (L) 08/13/2023   Lab Results  Component Value Date   LDLCALC 27 08/13/2023   Lab Results  Component Value Date   TRIG 335 (H) 08/13/2023   Lab Results  Component Value Date   CHOLHDL 3.8 08/13/2023   Lab Results  Component Value Date   HGBA1C 7.1 (H) 08/13/2023      Assessment & Plan:    Problem List Items Addressed This Visit       Cardiovascular and Mediastinum   Essential hypertension    BP Readings from Last 1 Encounters:  08/22/23 123/79   Well-controlled with Amlodipine 5 mg QD, Telmisartan 40  mg QD and metoprolol 25 mg BID Counseled for compliance with the medications Advised DASH diet and moderate exercise/walking, at least 150 mins/week        Endocrine   Type 2 diabetes mellitus with diabetic neuropathy, unspecified (HCC)    Lab Results  Component Value Date   HGBA1C 7.1 (H) 08/13/2023   Uncontrolled Did not tolerate metformin On glipizide 2.5 mg once daily now, increased dose to 5 mg QD Advised to follow diabetic diet On statin and ARB F/u CMP and lipid panel Diabetic eye exam: Advised to follow up with Ophthalmology for diabetic eye exam  On gabapentin 300 mg twice daily for neuropathy, increase dose to 400 mg BID due to persistent symptoms If persistent, will refer to Neurology      Relevant Medications   glipiZIDE (GLUCOTROL XL) 5 MG 24 hr tablet   Other Relevant Orders   Urine Microalbumin w/creat. ratio     Nervous and Auditory   Peripheral polyneuropathy    Uncontrolled with Gabapentin to 300 mg BID, increased dose to 400 mg twice daily If persistent, will refer to Neurology      Relevant Medications   gabapentin (NEURONTIN) 400 MG capsule     Other   Mixed hyperlipidemia (Chronic)    Checked lipid profile On Atorvastatin 20 mg QD      Vitamin D deficiency    Last vitamin D Lab Results  Component Value Date   VD25OH 16.2 (L) 08/13/2023   Advised to take Vitamin D 5000 IU QD      Encounter for general adult medical examination with abnormal findings - Primary    Physical exam as documented. Counseling done  re healthy lifestyle involving commitment to 150 minutes exercise per week, heart healthy diet, and attaining healthy weight.The importance of adequate sleep also discussed. Changes in health habits are decided  on by the patient with goals and time frames  set for achieving them. Immunization and cancer screening needs are specifically addressed at this visit.      Benign prostatic hyperplasia with urinary frequency    His urinary symptoms are likely due to BPH Started Flomax 0.4 mg nightly      Relevant Medications   tamsulosin (FLOMAX) 0.4 MG CAPS capsule    Meds ordered this encounter  Medications   glipiZIDE (GLUCOTROL XL) 5 MG 24 hr tablet    Sig: Take 1 tablet (5 mg total) by mouth daily with breakfast.    Dispense:  90 tablet    Refill:  1   gabapentin (NEURONTIN) 400 MG capsule    Sig: Take 1 capsule (400 mg total) by mouth 2 (two) times daily.    Dispense:  60 capsule  Refill:  5   tamsulosin (FLOMAX) 0.4 MG CAPS capsule    Sig: Take 1 capsule (0.4 mg total) by mouth daily.    Dispense:  30 capsule    Refill:  3    Follow-up: Return in about 4 months (around 12/23/2023) for DM and neuropathy.    Anabel Halon, MD

## 2023-08-22 NOTE — Patient Instructions (Addendum)
Please start taking Glipizide 5 mg once daily.  Please start taking Gabapentin 400 mg twice daily.  Please start taking Tamsulosin 0.4 mg at bedtime for urinary symptoms.  Start taking Vitamin D 5000 IU once daily and Vitamin B12 500 mcg once daily.  Please continue to take other medications as prescribed.  Please continue to follow low carb diet and perform moderate exercise/walking at least 150 mins/week.

## 2023-08-24 LAB — MICROALBUMIN / CREATININE URINE RATIO
Creatinine, Urine: 145.8 mg/dL
Microalb/Creat Ratio: 3 mg/g{creat} (ref 0–29)
Microalbumin, Urine: 4.8 ug/mL

## 2023-08-29 ENCOUNTER — Encounter: Payer: Self-pay | Admitting: Gastroenterology

## 2023-08-29 ENCOUNTER — Ambulatory Visit (INDEPENDENT_AMBULATORY_CARE_PROVIDER_SITE_OTHER): Payer: Medicaid Other | Admitting: Gastroenterology

## 2023-08-29 VITALS — BP 136/84 | HR 54 | Temp 98.6°F | Ht 71.0 in | Wt 190.6 lb

## 2023-08-29 DIAGNOSIS — K209 Esophagitis, unspecified without bleeding: Secondary | ICD-10-CM | POA: Diagnosis not present

## 2023-08-29 DIAGNOSIS — Z8719 Personal history of other diseases of the digestive system: Secondary | ICD-10-CM

## 2023-08-29 DIAGNOSIS — Z9049 Acquired absence of other specified parts of digestive tract: Secondary | ICD-10-CM

## 2023-08-29 DIAGNOSIS — K2970 Gastritis, unspecified, without bleeding: Secondary | ICD-10-CM

## 2023-08-29 DIAGNOSIS — K219 Gastro-esophageal reflux disease without esophagitis: Secondary | ICD-10-CM

## 2023-08-29 DIAGNOSIS — K2980 Duodenitis without bleeding: Secondary | ICD-10-CM

## 2023-08-29 DIAGNOSIS — R634 Abnormal weight loss: Secondary | ICD-10-CM

## 2023-08-29 DIAGNOSIS — R197 Diarrhea, unspecified: Secondary | ICD-10-CM

## 2023-08-29 NOTE — Progress Notes (Signed)
GI Office Note    Referring Provider: Anabel Halon, MD Primary Care Physician:  Anabel Halon, MD Primary Gastroenterologist: Hennie Duos. Marletta Lor, DO  Date:  08/29/2023  ID:  David Knox, DOB 08-06-1966, MRN [id number]   Chief Complaint   Chief Complaint  Patient presents with   Follow-up    Follow up on GERD. Pt has had gallbladder surgery since seeing [place]   History of Present Illness  David Knox is a 57 y.o. male with a history of diabetes, HLD, HTN, GERD, cocaine use (sober for 3-4 years), and recent cholecystectomy presenting today for follow up.   Office visit 06/29/2022 to discuss scheduling first-ever screening colonoscopy.  He denied any melena, BRBPR, unintentional weight loss, family history of colon cancer.  Did report postprandial bowel movements stating is a chronic issue.  Patient has a bowel movement 30 minutes after eating.  Mostly formed stools, sometimes runny.  Dairy products can cause looser stools.  Sometimes having 5-6 BMs daily without nocturnal stools.  Denied abdominal pain.  GERD controlled with omeprazole 20 mg over-the-counter daily.  Patient reported not being bothered by stools, lactose intolerance suspected.  Advised screening for celiac disease with serologies.  Prior thyroid levels within normal limits. Scheduled for colonoscopy (urine drug screen advised).  Advised Lactaid prior to dairy consumption and advised to stop omeprazole start pantoprazole in case omeprazole causes frequent diarrhea.   IgA elevated, however TTG IgA negative.   Colonoscopy 07/27/2022: -Transverse and descending diverticulosis -4 mm polyp in descending colon -Diminutive hyperplastic polyp, negative for dysplasia -Repeat colonoscopy in 10 years   Office visit 10/26/22. Having loose BM after every meal. Occurring sooner after meals. Still has gallbladder and reports this is same issues as his wife. Tries to avoid dairy products. Stools are bristol 5-7. Started  metformin in July /August 2023 after hip surgery. Originally stated symptoms began in July/August of 2023. GERD controlled. Advised imodium as needed. Advised to discuss stopping metformin with PCP. Continue pantoprazole daily. Advised potential further workup with fecal elastase and possible abdominal imaging if imodium and stopping metformin not helpful. FECAL ELASTASE NEVER PERFORMED.   EGD 01/18/23: -3 cm hiatal hernia -Grade a reflux esophagitis without bleeding s/p biopsy -Gastritis s/p biopsy -Duodenitis with history of biopsy -Pathology -duodenal biopsies with foveolar metaplasia suggesting peptic injury gastric biopsies with reactive gastropathy, negative for H. pylori, GE junction with mild reflux changes - Advised PPI BID   PCP recommended decreasing his metformin from 1000 mg twice daily to 500 mg daily given his reports of diarrhea.  He was advised to continue to take Imodium as needed.  Also recommended over-the-counter probiotic.  Last office visit 06/13/2023.  Patient reported he was taking Imodium once daily and having 4-5 bowel movements per day,'s usually in small amounts.  Usually occurs within 10-15 minutes of meals.  Stools described as [place] 6-7.  Denied any eating dairy products, reports pieces of lettuce in his stool.  Denied any nausea or vomiting.  GERD pretty well-controlled without dysphagia.  Again advised to check fecal elastase, continue Imodium 1-2 times a day and could schedule first dose every morning.  Continue PPI twice daily.  Advised we could consider trial of pancreatic enzymes if elastase is low versus cholestyramine.  Encouraged to continue to avoid lactose.  Hospitalization at [facility] 9/15 - 9/18.  He presented with 5 days of RUQ pain as well as chest pain and vomiting.  He was diagnosed with acute cholecystitis and underwent cholecystectomy  with Dr. Henreitta Leber with need for JP drain placement given gallbladder was thickened and distended and had purulent drainage.   He was noted to have significant impaction of stones and inflammation within the gallbladder.  He was treated with Augmentin.  He was seen by Dr. Henreitta Leber on 9/26 for follow-up post cholecystectomy.  Patient reportedly feeling well and had minimal output from JP drain therefore it was removed and dressing placed.  Today:  No abdominal pain, N/V. Had one day of N/V prior to cholecystectomy. Morning of ED visit he could hardly stand up.   Still goes after meals, about 30 minutes after but it depends on what he eats. Fattier or greasy meals.   Reflux has been doing good as long as he takes his medication. No breakthrough symptoms. Does not forget to take his medication. No dysphagia. Gained weight initially but recently losing some. Appetite is good. Doe not weigh himself at home. Does not eat as much as usually if he goes out knowing he will need to go to the bathroom.   Sometimes shortness of breath with walking and then gets chest tightness. Melene Plan to see cardiology tomorrow. No syncope.   Currently on lowest dose of glipizide.   Wt Readings from Last 3 Encounters:  08/29/23 190 lb 9.6 oz (86.5 kg)  08/22/23 192 lb (87.1 kg)  08/02/23 188 lb (85.3 kg)    Current Outpatient Medications  Medication Sig Dispense Refill   albuterol (VENTOLIN HFA) 108 (90 Base) MCG/ACT inhaler Inhale 2 puffs into the lungs every 6 (six) hours as needed for wheezing or shortness of breath. 8 g 3   amLODipine (NORVASC) 5 MG tablet TAKE 1 TABLET (5 MG TOTAL) BY MOUTH DAILY. 90 tablet 1   atorvastatin (LIPITOR) 20 MG tablet TAKE 1 TABLET BY MOUTH EVERY DAY 90 tablet 1   blood glucose meter kit and supplies Dispense based on patient and insurance preference. Use up to four times daily as directed. (FOR ICD-10 E10.9, E11.9). 1 each 0   gabapentin (NEURONTIN) 400 MG capsule Take 1 capsule (400 mg total) by mouth 2 (two) times daily. 60 capsule 5   glipiZIDE (GLUCOTROL XL) 5 MG 24 hr tablet Take 1 tablet (5 mg total)  by mouth daily with breakfast. 90 tablet 1   ibuprofen (ADVIL) 200 MG tablet Take 400 mg by mouth 2 (two) times daily as needed for headache or moderate pain.     latanoprost (XALATAN) 0.005 % ophthalmic solution Place 1 drop into both eyes at bedtime.     metoprolol tartrate (LOPRESSOR) 25 MG tablet TAKE 1 TABLET BY MOUTH (2) TIMES A DAY. 180 tablet 2   nitroGLYCERIN (NITROSTAT) 0.4 MG SL tablet Place 1 tablet (0.4 mg total) under the tongue every 5 (five) minutes as needed for chest pain. 10 tablet 1   pantoprazole (PROTONIX) 40 MG tablet Take 1 tablet (40 mg total) by mouth 2 (two) times daily. 60 tablet 11   tamsulosin (FLOMAX) 0.4 MG CAPS capsule Take 1 capsule (0.4 mg total) by mouth daily. 30 capsule 3   telmisartan (MICARDIS) 40 MG tablet Take 1 tablet (40 mg total) by mouth daily. 90 tablet 1   No current facility-administered medications for this visit.    Past Medical History:  Diagnosis Date   Acid reflux    Diabetes mellitus without complication (HCC)    HLD (hyperlipidemia)    HTN (hypertension)     Past Surgical History:  Procedure Laterality Date   BIOPSY  01/18/2023   Procedure: BIOPSY;  Surgeon: Lanelle Bal, DO;  [facility];  Service: Endoscopy;;   CHOLECYSTECTOMY N/A 07/23/2023   Procedure: LAPAROSCOPIC CHOLECYSTECTOMY;  Surgeon: Lucretia Roers, MD;  Location: [facility];  Service: General;  Laterality: N/A;   COLONOSCOPY WITH PROPOFOL N/A 07/27/2022   Procedure: COLONOSCOPY WITH PROPOFOL;  Surgeon: Lanelle Bal, DO;  [facility]: [facility];  Service: Endoscopy;  Laterality: N/A;  9:00 am  ASA 2   ESOPHAGOGASTRODUODENOSCOPY  2002   RMR: ulcerative reflux esophagitis, moderate sized hiatal hernia   ESOPHAGOGASTRODUODENOSCOPY (EGD) WITH PROPOFOL N/A 01/18/2023   Procedure: ESOPHAGOGASTRODUODENOSCOPY (EGD) WITH PROPOFOL;  Surgeon: Lanelle Bal, DO;  [facility];  Service: Endoscopy;  Laterality: N/A;  1:45 pm, asa 2   HIP SURGERY  Bilateral    as a child   POLYPECTOMY  07/27/2022   Procedure: POLYPECTOMY;  Surgeon: Lanelle Bal, DO;  [facility]: [facility];  Service: Endoscopy;;   TOOTH EXTRACTION     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   Procedure: TOTAL HIP ARTHROPLASTY;  Surgeon: Vickki Hearing, MD;  Location: [facility];  Service: Orthopedics;  Laterality: Right;   WRIST SURGERY      Family History  Problem Relation Age of Onset   Diabetes Mother    Heart disease Mother    Diabetes Father    Heart disease Father    Colon cancer Neg Hx     Allergies as of 08/29/2023 - Review Complete 08/29/2023  Allergen Reaction Noted   Asa [aspirin] Nausea Only 05/18/2022    Social History   Socioeconomic History   Marital status: Single    Spouse name: Not on file   Number of children: Not on file   Years of education: Not on file   Highest education level: Not on file  Occupational History   Not on file  Tobacco Use   Smoking status: Every Day    Current packs/day: 0.50    Types: Cigarettes   Smokeless tobacco: Never   Tobacco comments:    5 ciggs per day - 05/07/2023  Vaping Use   Vaping status: Never Used  Substance and Sexual Activity   Alcohol use: Yes    Comment: beer and liquor twice weekly   Drug use: No    Comment: "crack" former- last about 3 years ago.   Sexual activity: Not on file  Other Topics Concern   Not on file  Social History Narrative   Not on file   Social Determinants of Health   Financial Resource Strain: Not on file  Food Insecurity: No Food Insecurity (07/22/2023)   Hunger Vital Sign    Worried About Running Out of Food in the Last Year: Never true    Ran Out of Food in the Last Year: Never true  Transportation Needs: No Transportation Needs (07/22/2023)   PRAPARE - Administrator, Civil Service (Medical): No    Lack of Transportation (Non-Medical): No  Physical Activity: Not on file  Stress: Not on file  Social Connections: Not on file    Review of Systems   Gen: Denies fever, chills, anorexia. Denies fatigue, weakness, weight loss.  CV: Denies chest pain, palpitations, syncope, peripheral edema, and claudication. Resp: Denies dyspnea at rest, cough, wheezing, coughing up blood, and pleurisy. GI: See HPI Derm: Denies rash, itching, dry skin Psych: Denies depression, anxiety, memory loss, confusion. No homicidal or suicidal ideation.  Heme: Denies bruising, bleeding, and enlarged  lymph nodes.  Physical Exam   BP 136/84   Pulse (!) 54   Temp 98.6 F (37 C)   Ht 5\' 11"  (1.803 m)   Wt 190 lb 9.6 oz (86.5 kg)   BMI 26.58 kg/m   General:   Alert and oriented. No distress noted. Pleasant and cooperative.  Head:  Normocephalic and atraumatic. Eyes:  Conjuctiva clear without scleral icterus. Mouth:  Oral mucosa pink and moist. Good dentition. No lesions. Lungs:  Clear to auscultation bilaterally. No wheezes, rales, or rhonchi. No distress.  Heart:  S1, S2 present without murmurs appreciated.  Abdomen:  +BS, soft, non-tender and non-distended. No rebound or guarding. No HSM or masses noted. Rectal: deferred Msk:  Symmetrical without gross deformities. Normal posture. Extremities:  Without edema. Neurologic:  Alert and  oriented x4 Psych:  Alert and cooperative. Normal mood and affect.   Assessment  David Knox is a 57 y.o. male with a history of diabetes, HLD, HTN, GERD, cocaine use (sober for 3-4 years), and recent cholecystectomy presenting today for follow up.   GERD, esophagitis/gastritis:  -No recent hematemesis or coffee-ground emesis -EGD earlier this year with esophagitis, gastritis, duodenitis -Symptoms well-controlled with pantoprazole 40 mg twice daily -Denies nausea, vomiting, or dysphagia.  Diarrhea, recent cholecystitis:  -Continues to have postprandial urgency and looser stools -Prior workup negative for celiac and normal TSH.  Fecal Cal was never checked. -Recent cholecystitis s/p  cholecystectomy 9/15 complicated by purulent drainage with need for brief JP drain and treated with Augmentin -Discussed allowing time for liver to adjust and then may consider cholestyramine if symptoms worsen -Advised Imodium prior to large meals when going out if needed. -Mild intermittent weight loss, per our records it appears weight is stable over the last couple of months  PLAN   Imodium prior to going out if needed.  Pantoprazole 40 mg BID GERD diet Gallbladder eating plan Follow up with cardiology tomorrow Follow up in 6 months.  Brooke Bonito, MSN, FNP-BC, AGACNP-BC [facility]

## 2023-08-29 NOTE — Patient Instructions (Addendum)
Continue pantoprazole 40 mg twice daily.  Follow a GERD diet:  Avoid fried, fatty, greasy, spicy, citrus foods. Avoid caffeine and carbonated beverages. Avoid chocolate. Try eating 4-6 small meals a day rather than 3 large meals. Do not eat within 3 hours of laying down. Prop head of bed up on wood or bricks to create a 6 inch incline.  Continue to follow a gallbladder diet.  Essentially this consists of lower fat and avoiding spicy/greasy foods.  As we discussed I think you would benefit from Imodium 2 mg prior to eating a large meal when going out to avoid significant urgency.  You can try this if you would like.  Please monitor your weight and how your clothes are fitting.  If you have a significant amount of weight loss within a couple of months please let me know.  We will plan to follow-up in 6 months, sooner if needed.  It was a pleasure to see you today. I want to create trusting relationships with patients. If you receive a survey regarding your visit,  I greatly appreciate you taking time to fill this out on paper or through your MyChart. I value your feedback.  Brooke Bonito, MSN, FNP-BC, AGACNP-BC [facility]

## 2023-08-30 ENCOUNTER — Ambulatory Visit: Payer: Medicaid Other | Attending: Student | Admitting: Student

## 2023-08-30 NOTE — Progress Notes (Deleted)
Cardiology Office Note    Date:  08/30/2023  ID:  David Knox, DOB 01-Dec-1965, MRN [id number] Cardiologist: Marjo Bicker, MD    History of Present Illness:    David Knox is a 57 y.o. male with past medical history of chest pain (intermediate-risk NST in 02/2023 due to reports of chest pain but no ischemia by imaging), HTN, HLD, Type II DM, peripheral neuropathy and tobacco use who presents to the office today for evaluation of chest pain.  He was examined by Dr. Jenene Slicker in 05/2023 and reported having episodes of exertional chest pain intermittently throughout the past year but symptoms had improved since reducing his tobacco and alcohol use.  Was recommended to consider a cardiac PET in the future to rule out microvascular disease if he had recurrent symptoms.   In the interim, he was admitted to [facility] in 07/2023 for evaluation of right upper quadrant pain and flank pain with associated nausea and vomiting. He was found to have acute cholecystitis and underwent cholecystectomy with JP drain placement as well. Was discharged home on 07/25/2023.  ROS: ***  Studies Reviewed:   EKG: EKG is*** ordered today and demonstrates ***   EKG Interpretation Date/Time:    Ventricular Rate:    PR Interval:    QRS Duration:    QT Interval:    QTC Calculation:   R Axis:      Text Interpretation:         NST: 02/2023   Patient exercised for 5 minutes 13 seconds according to Bruce protocol achieving 7.00 METS. Heart rate increased to 142 bpm which is 85% of maximum predicted heart rate. Fair exercise capacity. He experienced non-limiting chest tightness during the test which resolved completely after stopping the test. Stress ECG is negative for ischemia and arrythmias. Duke Treadmill score is +1, intermediate risk   LV perfusion is normal. There is no evidence of ischemia. There is no evidence of infarction.   Left ventricular function is normal. Nuclear stress EF: 55  %.   The study is normal. The study is intermediate risk based on Duke treadmill score and low risk based on nuclear imaging.  Echocardiogram: 02/2023 IMPRESSIONS     1. Left ventricular ejection fraction, by estimation, is 60 to 65%. The  left ventricle has normal function. The left ventricle has no regional  wall motion abnormalities. Left ventricular diastolic parameters are  indeterminate.   2. Right ventricular systolic function is normal. The right ventricular  size is normal. Tricuspid regurgitation signal is inadequate for assessing  PA pressure.   3. The mitral valve is grossly normal. Trivial mitral valve  regurgitation.   4. The aortic valve is tricuspid. Aortic valve regurgitation is not  visualized.   5. The inferior vena cava is normal in size with greater than 50%  respiratory variability, suggesting right atrial pressure of 3 mmHg.   Comparison(s): No prior Echocardiogram.   Risk Assessment/Calculations:   {Does this patient have ATRIAL FIBRILLATION?:[phone]}           Physical Exam:   VS:  There were no vitals taken for this visit.   Wt Readings from Last 3 Encounters:  08/29/23 190 lb 9.6 oz (86.5 kg)  08/22/23 192 lb (87.1 kg)  08/02/23 188 lb (85.3 kg)     GEN: Well nourished, well developed in no acute distress NECK: No JVD; No carotid bruits CARDIAC: ***RRR, no murmurs, rubs, gallops RESPIRATORY:  Clear to auscultation without rales, wheezing or  rhonchi  ABDOMEN: Appears non-distended. No obvious abdominal masses. EXTREMITIES: No clubbing or cyanosis. No edema.  Distal pedal pulses are 2+ bilaterally.   Assessment and Plan:   1. Chest Pain - ***  2. HTN - ***Continue current medical therapy with Amlodipine 5 mg daily, Lopressor 25 mg twice daily and Telmisartan 40 mg daily.  3. HLD - FLP earlier this month showed LDL at 27. Triglycerides still elevated at 335 but significantly improved as compared to last year as previously 717.  Continue current medical therapy with Atorvastatin 20 mg daily.  4. Type 2 DM - Hgb A1c was at 7.1 when checked earlier this month.   Signed, Ellsworth Lennox, PA-C

## 2023-09-11 ENCOUNTER — Encounter: Payer: Self-pay | Admitting: Student

## 2023-09-11 ENCOUNTER — Ambulatory Visit: Payer: Medicaid Other | Attending: Student | Admitting: Student

## 2023-09-11 ENCOUNTER — Other Ambulatory Visit (HOSPITAL_COMMUNITY)
Admission: RE | Admit: 2023-09-11 | Discharge: 2023-09-11 | Disposition: A | Discharge status: Home / Self Care | Payer: Medicaid Other | Source: Ambulatory Visit | Attending: Student | Admitting: Student

## 2023-09-11 VITALS — BP 124/72 | HR 64 | Ht 71.0 in | Wt 190.0 lb

## 2023-09-11 DIAGNOSIS — R079 Chest pain, unspecified: Secondary | ICD-10-CM

## 2023-09-11 DIAGNOSIS — I10 Essential (primary) hypertension: Secondary | ICD-10-CM | POA: Diagnosis not present

## 2023-09-11 DIAGNOSIS — R0609 Other forms of dyspnea: Secondary | ICD-10-CM | POA: Diagnosis present

## 2023-09-11 DIAGNOSIS — E782 Mixed hyperlipidemia: Secondary | ICD-10-CM | POA: Diagnosis not present

## 2023-09-11 DIAGNOSIS — E1140 Type 2 diabetes mellitus with diabetic neuropathy, unspecified: Secondary | ICD-10-CM

## 2023-09-11 LAB — BASIC METABOLIC PANEL
Anion gap: 8 (ref 5–15)
BUN: 11 mg/dL (ref 6–20)
CO2: 24 mmol/L (ref 22–32)
Calcium: 8.7 mg/dL — ABNORMAL LOW (ref 8.9–10.3)
Chloride: 105 mmol/L (ref 98–111)
Creatinine, Ser: 1.17 mg/dL (ref 0.61–1.24)
GFR, Estimated: >60 mL/min (ref >60)
Glucose, Bld: 124 mg/dL — ABNORMAL HIGH (ref 70–99)
Potassium: 3.6 mmol/L (ref 3.5–5.1)
Sodium: 137 mmol/L (ref 135–145)

## 2023-09-11 NOTE — Patient Instructions (Signed)
Medication Instructions:   Your physician recommends that you continue on your current medications as directed. Please refer to the Current Medication list given to you today.  On the morning of you CT Scan take 50 mg of Metoprolol Tartrate.   *If you need a refill on your cardiac medications before your next appointment, please call your pharmacy*   Lab Work: Your physician recommends that you return for lab work in: Today   If you have labs (blood work) drawn today and your tests are completely normal, you will receive your results only by: MyChart Message (if you have MyChart) OR A paper copy in the mail If you have any lab test that is abnormal or we need to change your treatment, we will call you to review the results.   Testing/Procedures:   Your cardiac CT will be scheduled at one of the below locations:   [facility] [address] [place], [place] [address] [phone]  OR  [facility] [address] [place], [place] [address] [phone]  OR   [facility] [address] [place], [place] [address] [phone]  If scheduled at [facility], please arrive at the [facility] and Children's Entrance (Entrance C2) of [facility] 30 minutes prior to test start time. You can use the FREE valet parking offered at entrance C (encouraged to control the heart rate for the test)  Proceed to the [facility] Radiology Department (first floor) to check-in and test prep.  All radiology patients and guests should use entrance C2 at [facility], accessed from [facility], even though the hospital's physical address listed is [address]    If scheduled at [facility] or [facility], please arrive 15 mins early for check-in and test prep.  There is spacious parking and easy access to the radiology  department from the [facility] V[facility] entrance. Please enter here and check-in with the desk attendant.   Please follow these instructions carefully (unless otherwise directed):  An IV will be required for this test and Nitroglycerin will be given.  Hold all erectile dysfunction medications at least 3 days (72 hrs) prior to test. (Ie viagra, cialis, sildenafil, tadalafil, etc)   On the Night Before the Test: Be sure to Drink plenty of water. Do not consume any caffeinated/decaffeinated beverages or chocolate 12 hours prior to your test. Do not take any antihistamines 12 hours prior to your test.  On the Day of the Test: Drink plenty of water until 1 hour prior to the test. Do not eat any food 1 hour prior to test. You may take your regular medications prior to the test.  Take metoprolol (Lopressor) two hours prior to test. If you take Furosemide/Hydrochlorothiazide/Spironolactone, please HOLD on the morning of the test. FEMALES- please wear underwire-free bra if available, avoid dresses & tight clothing  *For Clinical Staff only. Please instruct patient the following:* Heart Rate Medication Recommendations for Cardiac CT  Resting HR < 50 bpm  No medication  Resting HR 50-60 bpm and BP >110/50 mmHG   Consider Metoprolol tartrate 25 mg PO 90-120 min prior to scan  Resting HR 60-65 bpm and BP >110/50 mmHG  Metoprolol tartrate 50 mg PO 90-120 minutes prior to scan   Resting HR > 65 bpm and BP >110/50 mmHG  Metoprolol tartrate 100 mg PO 90-120 minutes prior to scan  Consider Ivabradine 10-15 mg  PO or a calcium channel blocker for resting HR >60 bpm and contraindication to metoprolol tartrate  Consider Ivabradine 10-15 mg PO in combination with metoprolol tartrate for HR >80 bpm        After the Test: Drink plenty of water. After receiving IV contrast, you may experience a mild flushed feeling. This is normal. On occasion, you may experience a mild rash up to 24 hours after the  test. This is not dangerous. If this occurs, you can take Benadryl 25 mg and increase your fluid intake. If you experience trouble breathing, this can be serious. If it is severe call 911 IMMEDIATELY. If it is mild, please call our office. If you take any of these medications: Glipizide/Metformin, Avandament, Glucavance, please do not take 48 hours after completing test unless otherwise instructed.  We will call to schedule your test 2-4 weeks out understanding that some insurance companies will need an authorization prior to the service being performed.   For more information and frequently asked questions, please visit our website : [email or web address]  For non-scheduling related questions, please contact the cardiac imaging nurse navigator should you have any questions/concerns: Cardiac Imaging Nurse Navigators Direct Office Dial: [phone]   For scheduling needs, including cancellations and rescheduling, please call [place], [phone].    Follow-Up: At [facility], you and your health needs are our priority.  As part of our continuing mission to provide you with exceptional heart care, we have created designated Provider Care Teams.  These Care Teams include your primary Cardiologist (physician) and Advanced Practice Providers (APPs -  Physician Assistants and Nurse Practitioners) who all work together to provide you with the care you need, when you need it.  We recommend signing up for the patient portal called "MyChart".  Sign up information is provided on this After Visit Summary.  MyChart is used to connect with patients for Virtual Visits (Telemedicine).  Patients are able to view lab/test results, encounter notes, upcoming appointments, etc.  Non-urgent messages can be sent to your provider as well.   To learn more about what you can do with MyChart, go to [email or web address].    Your next appointment:   3 month(s)  Provider:   You may see Vishnu  P Mallipeddi, MD or one of the following Advanced Practice Providers on your designated Care Team:   Randall An, PA-C  Jacolyn Reedy, PA-C     Other Instructions Thank you for choosing [facility]!

## 2023-09-11 NOTE — Progress Notes (Signed)
Cardiology Office Note    Date:  09/11/2023  ID:  David Knox, DOB January 23, 1966, MRN [id number] Cardiologist: Marjo Bicker, MD    History of Present Illness:    David Knox is a 57 y.o. male with past medical history of chest pain (intermediate-risk NST in 02/2023 due to reports of chest pain but no ischemia by imaging), HTN, HLD, Type II DM, peripheral neuropathy and tobacco use who presents to the office today for evaluation of chest pain.  He was examined by Dr. Jenene Slicker in 05/2023 and reported having episodes of exertional chest pain intermittently throughout the past year but symptoms had improved since reducing his tobacco and alcohol use. Was recommended to consider a cardiac PET in the future to rule out microvascular disease if he had recurrent symptoms.   In the interim, he was admitted to [facility] in 07/2023 for evaluation of right upper quadrant pain and flank pain with associated nausea and vomiting. He was found to have acute cholecystitis and underwent cholecystectomy with JP drain placement as well. Was discharged home on 07/25/2023.  In talking with the patient today, he reports still having frequent episodes of chest pain on exertion.  He enjoys walking for exercise and says he develops chest tightness with this and discomfort along his left arm as well. Some days, he can walk for an extended period of time without symptoms. He denies any specific dyspnea on exertion, orthopnea or PND. Reports palpitations have overall been well-controlled. He does report discomfort along his feet which he describes as a burning sensation.  Has been on Gabapentin by his PCP with minimal improvement in symptoms thus far. He is scheduled to see Podiatry tomorrow and we reviewed based off their evaluation, he may further benefit from Neurology evaluation for neuropathy.  Studies Reviewed:   EKG: EKG is not ordered today. EKG from 07/22/2023 is reviewed and demonstrates NSR, HR 60  with baseline artifact. No acute ST changes.   NST: 02/2023   Patient exercised for 5 minutes 13 seconds according to Bruce protocol achieving 7.00 METS. Heart rate increased to 142 bpm which is 85% of maximum predicted heart rate. Fair exercise capacity. He experienced non-limiting chest tightness during the test which resolved completely after stopping the test. Stress ECG is negative for ischemia and arrythmias. Duke Treadmill score is +1, intermediate risk   LV perfusion is normal. There is no evidence of ischemia. There is no evidence of infarction.   Left ventricular function is normal. Nuclear stress EF: 55 %.   The study is normal. The study is intermediate risk based on Duke treadmill score and low risk based on nuclear imaging.  Echocardiogram: 02/2023 IMPRESSIONS     1. Left ventricular ejection fraction, by estimation, is 60 to 65%. The  left ventricle has normal function. The left ventricle has no regional  wall motion abnormalities. Left ventricular diastolic parameters are  indeterminate.   2. Right ventricular systolic function is normal. The right ventricular  size is normal. Tricuspid regurgitation signal is inadequate for assessing  PA pressure.   3. The mitral valve is grossly normal. Trivial mitral valve  regurgitation.   4. The aortic valve is tricuspid. Aortic valve regurgitation is not  visualized.   5. The inferior vena cava is normal in size with greater than 50%  respiratory variability, suggesting right atrial pressure of 3 mmHg.   Comparison(s): No prior Echocardiogram.   Physical Exam:   VS:  BP 124/72   Pulse  64   Ht 5\' 11"  (1.803 m)   Wt 190 lb (86.2 kg)   SpO2 96%   BMI 26.50 kg/m    Wt Readings from Last 3 Encounters:  09/11/23 190 lb (86.2 kg)  08/29/23 190 lb 9.6 oz (86.5 kg)  08/22/23 192 lb (87.1 kg)     GEN: Well nourished, well developed male appearing in no acute distress NECK: No JVD; No carotid bruits CARDIAC: RRR, no murmurs,  rubs, gallops RESPIRATORY:  Clear to auscultation without rales, wheezing or rhonchi  ABDOMEN: Appears non-distended. No obvious abdominal masses. EXTREMITIES: No clubbing or cyanosis. No pitting edema.  Distal pedal pulses are 2+ bilaterally.   Assessment and Plan:   1. Chest Pain concerning for a Cardiac Etiology - He describes episodes of left-sided chest pain with radiation to his arm with activity and this resolves with rest. However, symptoms are sporadic and do not always occur with exercise. He did have an NST in 02/2023 which was intermediate risk due to reported chest pain at that time but imaging showed no evidence of ischemia or infarction. - Reviewed testing options with the patient today and he is concerned about his possible plaque burden given a family history of coronary artery disease and cardiac risk factors (HTN, HLD and Type 2 DM). Reviewed testing options and will arrange for a Coronary CTA for additional ischemic evaluation.   2. HTN - Blood pressure is well-controlled at 124/72 during today's visit. Continue current medical therapy with Amlodipine 5 mg daily, Lopressor 25 mg twice daily and Telmisartan 40 mg daily.  3. HLD - FLP last month showed LDL at 27. Triglycerides still elevated at 335 but significantly improved as compared to last year as previously 717. Continue current medical therapy with Atorvastatin 20 mg daily.  4. Type 2 DM - Hgb A1c was at 7.1 when checked last month. Managed by his PCP. He remains on Glipizide.  Signed, Ellsworth Lennox, PA-C

## 2023-09-12 ENCOUNTER — Encounter: Payer: Self-pay | Admitting: Podiatry

## 2023-09-12 ENCOUNTER — Ambulatory Visit (INDEPENDENT_AMBULATORY_CARE_PROVIDER_SITE_OTHER): Payer: Medicaid Other | Admitting: Podiatry

## 2023-09-12 DIAGNOSIS — E1140 Type 2 diabetes mellitus with diabetic neuropathy, unspecified: Secondary | ICD-10-CM

## 2023-09-12 DIAGNOSIS — M722 Plantar fascial fibromatosis: Secondary | ICD-10-CM | POA: Diagnosis not present

## 2023-09-12 NOTE — Patient Instructions (Signed)

## 2023-09-12 NOTE — Progress Notes (Signed)
  Subjective:  Patient ID: David Knox, male    DOB: Jun 15, 1966,   MRN: [id number]  Chief Complaint  Patient presents with   Foot Pain    Pt presents for bil foot pain that has been going for over 2 years now and pt has a knot on his bottom left foot.    57 y.o. male presents for concern diabetic foot check as well as concern of lump on the bottom of her left foot that recently poppedup and been irritating. He relates neuropathy he has been dealing with for 2 years.  Relates burning and tingling in their feet. Patient is diabetic and last A1c was  Lab Results  Component Value Date   HGBA1C 7.1 (H) 08/13/2023   .   PCP:  Anabel Halon, MD    . Denies any other pedal complaints. Denies n/v/f/c.   Past Medical History:  Diagnosis Date   Acid reflux    Diabetes mellitus without complication (HCC)    HLD (hyperlipidemia)    HTN (hypertension)     Objective:  Physical Exam: Vascular: DP/PT pulses 2/4 bilateral. CFT <3 seconds. Absent hair growth on digits. Edema noted to bilateral lower extremities. Xerosis noted bilaterally.  Skin. No lacerations or abrasions bilateral feet. Nails 1-5 bilateral  are normal in appearance. Small 0.5 mm soft tissue mass noted to medial plantar arch. Mobile with the plantar fascia. Some pain to palpation.  Musculoskeletal: MMT 5/5 bilateral lower extremities in DF, PF, Inversion and Eversion. Deceased ROM in DF of ankle joint.  Neurological: Sensation intact to light touch. Protective sensation diminished bilateral.    Assessment:   1. Type 2 diabetes mellitus with diabetic neuropathy, without long-term current use of insulin (HCC)   2. Plantar fibromatosis      Plan:  Patient was evaluated and treated and all questions answered. -Discussed and educated patient on diabetic foot care, especially with  regards to the vascular, neurological and musculoskeletal systems.  -Stressed the importance of good glycemic control and the detriment of not   controlling glucose levels in relation to the foot. -Discussed supportive shoes at all times and checking feet regularly.  -Padding provided to offload firboa.  Discussed stretching exercises and anti-inflammatories.  -Answered all patient questions -Patient to return  in 3 months for at risk foot care -Patient advised to call the office if any problems or questions arise in the meantime.   Louann Sjogren, DPM

## 2023-09-17 ENCOUNTER — Other Ambulatory Visit: Payer: Self-pay | Admitting: Internal Medicine

## 2023-09-17 DIAGNOSIS — E782 Mixed hyperlipidemia: Secondary | ICD-10-CM

## 2023-09-18 ENCOUNTER — Other Ambulatory Visit: Payer: Self-pay | Admitting: Internal Medicine

## 2023-09-18 DIAGNOSIS — N401 Enlarged prostate with lower urinary tract symptoms: Secondary | ICD-10-CM

## 2023-09-25 ENCOUNTER — Encounter (HOSPITAL_COMMUNITY): Payer: Self-pay

## 2023-09-26 ENCOUNTER — Telehealth (HOSPITAL_COMMUNITY): Payer: Self-pay | Admitting: *Deleted

## 2023-09-26 NOTE — Telephone Encounter (Signed)
Attempted to call patient regarding upcoming cardiac CT appointment. °Left message on voicemail with name and callback number ° °Edie Vallandingham RN Navigator Cardiac Imaging °[facility] °[phone] Office °[phone] Cell ° °

## 2023-09-27 ENCOUNTER — Ambulatory Visit (HOSPITAL_COMMUNITY): Admission: RE | Admit: 2023-09-27 | Payer: Medicaid Other | Source: Ambulatory Visit

## 2023-10-10 ENCOUNTER — Telehealth (HOSPITAL_COMMUNITY): Payer: Self-pay | Admitting: *Deleted

## 2023-10-10 NOTE — Telephone Encounter (Signed)
Attempted to call patient regarding upcoming cardiac CT appointment. Left message on voicemail with name and callback number Hayley Sharpe RN Navigator Cardiac Imaging [facility] [phone] Office   

## 2023-10-10 NOTE — Telephone Encounter (Signed)
 Received call from patient regarding upcoming cardiac imaging study; pt verbalizes understanding of appt date/time, parking situation and where to check in, pre-test NPO status and medications ordered, and verified current allergies; name and call back number provided for further questions should they arise Johney Frame RN Navigator Cardiac Imaging [facility] [phone] office [phone] cell

## 2023-10-11 ENCOUNTER — Ambulatory Visit (HOSPITAL_COMMUNITY)
Admission: RE | Admit: 2023-10-11 | Discharge: 2023-10-11 | Disposition: A | Discharge status: Home / Self Care | Payer: Medicaid Other | Source: Ambulatory Visit | Attending: Student | Admitting: Student

## 2023-10-11 DIAGNOSIS — R072 Precordial pain: Secondary | ICD-10-CM | POA: Diagnosis not present

## 2023-10-11 DIAGNOSIS — R079 Chest pain, unspecified: Secondary | ICD-10-CM | POA: Diagnosis present

## 2023-10-11 MED ORDER — NITROGLYCERIN 0.4 MG SL SUBL
SUBLINGUAL_TABLET | SUBLINGUAL | Status: AC
  Filled 2023-10-11: qty 2

## 2023-10-11 MED ORDER — NITROGLYCERIN 0.4 MG SL SUBL
0.8000 mg | SUBLINGUAL_TABLET | SUBLINGUAL | Status: DC | PRN
  Administered 2023-10-11: 0.8 mg via SUBLINGUAL

## 2023-10-11 MED ORDER — IOHEXOL 350 MG/ML SOLN
100.0000 mL | Freq: Once | INTRAVENOUS | Status: AC | PRN
  Administered 2023-10-11: 100 mL via INTRAVENOUS

## 2023-10-21 ENCOUNTER — Other Ambulatory Visit: Payer: Self-pay | Admitting: Internal Medicine

## 2023-10-21 DIAGNOSIS — E1140 Type 2 diabetes mellitus with diabetic neuropathy, unspecified: Secondary | ICD-10-CM

## 2023-10-26 ENCOUNTER — Ambulatory Visit: Payer: Medicaid Other | Admitting: Internal Medicine

## 2023-11-01 ENCOUNTER — Ambulatory Visit: Payer: Medicaid Other | Admitting: Student

## 2023-11-09 ENCOUNTER — Ambulatory Visit: Payer: Medicaid Other | Admitting: Student

## 2023-12-05 NOTE — Progress Notes (Unsigned)
GI Office Note    Referring Provider: Anabel Halon, MD Primary Care Physician:  Anabel Halon, MD Primary Gastroenterologist: Hennie Duos. Marletta Lor, DO  Date:  12/06/2023  ID:  David Knox, DOB 1965/11/26, MRN [id number]   Chief Complaint   Chief Complaint  Patient presents with   Diarrhea    Having issues with diarrhea/loose stools when he eats or drinks anything and sometimes in between   History of Present Illness  David Knox is a 58 y.o. male with a history of diabetes, HLD, HTN, GERD, prior cocaine abuse, and cholecystitis s/p cholecystectomy September 2024 presenting today with complaint of postprandial diarrhea.  OV 06/29/2022 to discuss scheduling first-ever screening colonoscopy.  He denied any melena, BRBPR, unintentional weight loss, family history of colon cancer.  Did report postprandial bowel movements stating is a chronic issue.  Patient has a bowel movement 30 minutes after eating.  Mostly formed stools, sometimes runny.  Dairy products can cause looser stools.  Sometimes having 5-6 BMs daily without nocturnal stools.  Denied abdominal pain.  GERD controlled with omeprazole 20 mg over-the-counter daily.  Patient reported not being bothered by stools, lactose intolerance suspected.  Advised screening for celiac disease with serologies.  Prior thyroid levels within normal limits. Scheduled for colonoscopy (urine drug screen advised).  Advised Lactaid prior to dairy consumption and advised to stop omeprazole start pantoprazole in case omeprazole causes frequent diarrhea.   IgA elevated, however TTG IgA negative.   Colonoscopy 07/27/2022: -Transverse and descending diverticulosis -4 mm polyp in descending colon -Diminutive hyperplastic polyp, negative for dysplasia -Repeat colonoscopy in 10 years   OV 10/26/22. Having loose BM after every meal. Occurring sooner after meals. Still has gallbladder and reports this is same issues as his wife. Tries to avoid dairy  products. Stools are bristol 5-7. Started metformin in July /August 2023 after hip surgery. Originally stated symptoms began in July/August of 2023. GERD controlled. Advised imodium as needed. Advised to discuss stopping metformin with PCP. Continue pantoprazole daily. Advised potential further workup with fecal elastase and possible abdominal imaging if imodium and stopping metformin not helpful. FECAL ELASTASE NEVER PERFORMED.    EGD 01/18/23: -3 cm hiatal hernia -Grade a reflux esophagitis without bleeding s/p biopsy -Gastritis s/p biopsy -Duodenitis with history of biopsy -Pathology -duodenal biopsies with foveolar metaplasia suggesting peptic injury gastric biopsies with reactive gastropathy, negative for H. pylori, GE junction with mild reflux changes - Advised PPI BID   PCP recommended decreasing his metformin from 1000 mg twice daily to 500 mg daily given his reports of diarrhea.  He was advised to continue to take Imodium as needed.  Also recommended over-the-counter probiotic.   OV 06/13/2023.  Patient reported he was taking Imodium once daily and having 4-5 bowel movements per day, usually in small amounts.  Usually occurs within 10-15 minutes of meals.  Stools described as Bristol 6-7.  Denied any eating dairy products, reports pieces of lettuce in his stool.  Denied any nausea or vomiting.  GERD pretty well-controlled without dysphagia.  Again advised to check fecal elastase, continue Imodium 1-2 times a day and could schedule first dose every morning.  Continue PPI twice daily.  Advised we could consider trial of pancreatic enzymes if elastase is low versus cholestyramine.  Encouraged to continue to avoid lactose.   Hospitalization at [facility] 9/15 - 9/18.  He presented with 5 days of RUQ pain as well as chest pain and vomiting.  He was diagnosed with acute cholecystitis  and underwent cholecystectomy with Dr. Henreitta Leber with need for JP drain placement given gallbladder was thickened and distended  and had purulent drainage.  He was noted to have significant impaction of stones and inflammation within the gallbladder.  He was treated with Augmentin.   He was seen by Dr. Henreitta Leber on 9/26 for follow-up post cholecystectomy.  Patient reportedly feeling well and had minimal output from JP drain therefore it was removed and dressing placed.  Last office visit 08/29/23.  Still having bowel movements about 30 minutes after meals, usually depending on what he eats.  Usually does occur with fatty or greasy meals.  Reflux doing good as long as he takes medication.  Denies any significant breakthrough symptoms.  Reported good appetite.  Does have some shortness of breath and chest tightness with exertion, going to see cardiology the following day.  Recommended Imodium if going out, following a gallbladder eating plan and continuing pantoprazole 40 mg twice daily.   Today:  No more than 5-10 minutes after eating he is in the bathroom.  Does not matter what he eats.He tries to stay active.  Weight has been stable, actually feels as though he has gained a little bit of weight and not lost any weight.  At one point going so much that he took pepto - did have black stool after that but then went back to normal brown color.  Stools have been more watery like recently.  Denies any fevers, abdominal pain, frequent flatulence, lack of appetite, early satiety, nausea, vomiting, dysphagia.  His abdomen has been making lots of growling sounds.  Does report some increased abdominal distention at times (reports his grandchildren say his belly looks bigger at times).   GERD well controlled with pantoprazole 40 BID. No breakthrough symptoms. BG has been good.    Wt Readings from Last 3 Encounters:  12/06/23 196 lb 3.2 oz (89 kg)  09/11/23 190 lb (86.2 kg)  08/29/23 190 lb 9.6 oz (86.5 kg)    Current Outpatient Medications  Medication Sig Dispense Refill   albuterol (VENTOLIN HFA) 108 (90 Base) MCG/ACT inhaler  Inhale 2 puffs into the lungs every 6 (six) hours as needed for wheezing or shortness of breath. 8 g 3   amLODipine (NORVASC) 5 MG tablet TAKE 1 TABLET (5 MG TOTAL) BY MOUTH DAILY. 90 tablet 1   atorvastatin (LIPITOR) 20 MG tablet TAKE 1 TABLET BY MOUTH EVERY DAY 90 tablet 1   blood glucose meter kit and supplies Dispense based on patient and insurance preference. Use up to four times daily as directed. (FOR ICD-10 E10.9, E11.9). 1 each 0   gabapentin (NEURONTIN) 400 MG capsule Take 1 capsule (400 mg total) by mouth 2 (two) times daily. 60 capsule 5   glipiZIDE (GLUCOTROL XL) 2.5 MG 24 hr tablet TAKE 1 TABLET BY MOUTH DAILY WITH BREAKFAST. 90 tablet 1   ibuprofen (ADVIL) 200 MG tablet Take 400 mg by mouth 2 (two) times daily as needed for headache or moderate pain.     latanoprost (XALATAN) 0.005 % ophthalmic solution Place 1 drop into both eyes at bedtime.     metoprolol tartrate (LOPRESSOR) 25 MG tablet TAKE 1 TABLET BY MOUTH (2) TIMES A DAY. 180 tablet 2   nitroGLYCERIN (NITROSTAT) 0.4 MG SL tablet Place 1 tablet (0.4 mg total) under the tongue every 5 (five) minutes as needed for chest pain. 10 tablet 1   pantoprazole (PROTONIX) 40 MG tablet Take 1 tablet (40 mg total) by mouth 2 (two)  times daily. 60 tablet 11   tamsulosin (FLOMAX) 0.4 MG CAPS capsule TAKE 1 CAPSULE BY MOUTH EVERY DAY 90 capsule 1   telmisartan (MICARDIS) 40 MG tablet Take 1 tablet (40 mg total) by mouth daily. 90 tablet 1   glipiZIDE (GLUCOTROL XL) 5 MG 24 hr tablet TAKE 1 TABLET BY MOUTH EVERY DAY WITH BREAKFAST 90 tablet 1   No current facility-administered medications for this visit.    Past Medical History:  Diagnosis Date   Acid reflux    Diabetes mellitus without complication (HCC)    HLD (hyperlipidemia)    HTN (hypertension)     Past Surgical History:  Procedure Laterality Date   BIOPSY  01/18/2023   Procedure: BIOPSY;  Surgeon: Lanelle Bal, DO;  [facility]: [facility];  Service: Endoscopy;;    CHOLECYSTECTOMY N/A 07/23/2023   Procedure: LAPAROSCOPIC CHOLECYSTECTOMY;  Surgeon: Lucretia Roers, MD;  Location: [facility];  Service: General;  Laterality: N/A;   COLONOSCOPY WITH PROPOFOL N/A 07/27/2022   Procedure: COLONOSCOPY WITH PROPOFOL;  Surgeon: Lanelle Bal, DO;  Location: [facility];  Service: Endoscopy;  Laterality: N/A;  9:00 am  ASA 2   ESOPHAGOGASTRODUODENOSCOPY  2002   RMR: ulcerative reflux esophagitis, moderate sized hiatal hernia   ESOPHAGOGASTRODUODENOSCOPY (EGD) WITH PROPOFOL N/A 01/18/2023   Procedure: ESOPHAGOGASTRODUODENOSCOPY (EGD) WITH PROPOFOL;  Surgeon: Lanelle Bal, DO;  [facility]: [facility];  Service: Endoscopy;  Laterality: N/A;  1:45 pm, asa 2   HIP SURGERY Bilateral    as a child   POLYPECTOMY  07/27/2022   Procedure: POLYPECTOMY;  Surgeon: Lanelle Bal, DO;  [facility]: [facility];  Service: Endoscopy;;   TOOTH EXTRACTION     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   Procedure: TOTAL HIP ARTHROPLASTY;  Surgeon: Vickki Hearing, MD;  Location: [facility];  Service: Orthopedics;  Laterality: Right;   WRIST SURGERY      Family History  Problem Relation Age of Onset   Diabetes Mother    Heart disease Mother    Diabetes Father    Heart disease Father    Colon cancer Neg Hx     Allergies as of 12/06/2023 - Review Complete 12/06/2023  Allergen Reaction Noted   Asa [aspirin] Nausea Only 05/18/2022    Social History   Socioeconomic History   Marital status: Single    Spouse name: Not on file   Number of children: Not on file   Years of education: Not on file   Highest education level: Not on file  Occupational History   Not on file  Tobacco Use   Smoking status: Every Day    Current packs/day: 0.50    Types: Cigarettes   Smokeless tobacco: Never   Tobacco comments:    5 ciggs per day - 05/07/2023  Vaping Use   Vaping status: Never Used  Substance and Sexual Activity   Alcohol use: Yes    Comment: beer and liquor twice  weekly   Drug use: No    Comment: "crack" former- last about 3 years ago.   Sexual activity: Not on file  Other Topics Concern   Not on file  Social History Narrative   Not on file   Social Drivers of Health   Financial Resource Strain: Not on file  Food Insecurity: No Food Insecurity (07/22/2023)   Hunger Vital Sign    Worried About Running Out of Food in the Last Year: Never true    Ran Out of Food  in the Last Year: Never true  Transportation Needs: No Transportation Needs (07/22/2023)   PRAPARE - Administrator, Civil Service (Medical): No    Lack of Transportation (Non-Medical): No  Physical Activity: Not on file  Stress: Not on file  Social Connections: Not on file   Review of Systems   Gen: Denies fever, chills, anorexia. Denies fatigue, weakness, weight loss.  CV: Denies chest pain, palpitations, syncope, peripheral edema, and claudication. Resp: Denies dyspnea at rest, cough, wheezing, coughing up blood, and pleurisy. GI: See HPI Derm: Denies rash, itching, dry skin Psych: Denies depression, anxiety, memory loss, confusion. No homicidal or suicidal ideation.  Heme: Denies bruising, bleeding, and enlarged lymph nodes.  Physical Exam   BP 136/88 (BP Location: Right Arm, Patient Position: Sitting, Cuff Size: Large)   Pulse 63   Temp 97.6 F (36.4 C) (Oral)   Ht 5\' 11"  (1.803 m)   Wt 196 lb 3.2 oz (89 kg)   SpO2 98%   BMI 27.36 kg/m   General:   Alert and oriented. No distress noted. Pleasant and cooperative.  Head:  Normocephalic and atraumatic. Eyes:  Conjuctiva clear without scleral icterus. Mouth:  Oral mucosa pink and moist. Good dentition. No lesions. Abdomen:  +BS, soft, non-tender and mildly distended. No rebound or guarding. No HSM or masses noted. Rectal: deferred Neurologic:  Alert and  oriented x4 Psych:  Alert and cooperative. Normal mood and affect.  Assessment  David Knox is a 58 y.o. male with a history of diabetes, HLD, HTN,  GERD, prior cocaine abuse, and cholecystitis s/p cholecystectomy September 2024 presenting today with chronic diarrhea.  Chronic diarrhea: Experienced bout of cholecystitis in September 2024 s/p cholecystectomy now with need for JP drain secondary to purulent drainage and was treated with antibiotics.  Previously discussed considering cholestyramine if his diarrhea were to worsen.  Given he continues to have diarrhea after meals we will start with trial of cholestyramine and check fecal elastase to rule out EPI given his issues with diarrhea even prior to cholecystectomy.  Instructing on timing for administration and monitor for constipation.  Encouraged to continue to follow a low-fat diet.  GERD: Well-controlled with pantoprazole 40 mg twice daily.  PLAN   Check fecal elastase. Trial of cholestyramine 4g once daily. Increase as needed. Advised to take 1 hour after morning medications.  Imodium as needed for severe refractory diarrhea. Continue pantoprazole 40 mg BID Low-fat diet Follow-up 6 weeks    Brooke Bonito, MSN, FNP-BC, AGACNP-BC [facility]

## 2023-12-06 ENCOUNTER — Encounter: Payer: Self-pay | Admitting: Gastroenterology

## 2023-12-06 ENCOUNTER — Ambulatory Visit (INDEPENDENT_AMBULATORY_CARE_PROVIDER_SITE_OTHER): Payer: Medicaid Other | Admitting: Gastroenterology

## 2023-12-06 ENCOUNTER — Other Ambulatory Visit: Payer: Self-pay | Admitting: Internal Medicine

## 2023-12-06 VITALS — BP 136/88 | HR 63 | Temp 97.6°F | Ht 71.0 in | Wt 196.2 lb

## 2023-12-06 DIAGNOSIS — K219 Gastro-esophageal reflux disease without esophagitis: Secondary | ICD-10-CM

## 2023-12-06 DIAGNOSIS — K529 Noninfective gastroenteritis and colitis, unspecified: Secondary | ICD-10-CM

## 2023-12-06 DIAGNOSIS — E1140 Type 2 diabetes mellitus with diabetic neuropathy, unspecified: Secondary | ICD-10-CM

## 2023-12-06 DIAGNOSIS — Z9049 Acquired absence of other specified parts of digestive tract: Secondary | ICD-10-CM | POA: Diagnosis not present

## 2023-12-06 MED ORDER — CHOLESTYRAMINE 4 GM/DOSE PO POWD: 4.0000 g | Freq: Every day | ORAL | 0 refills | Status: DC

## 2023-12-06 NOTE — Patient Instructions (Addendum)
Start cholestyramine 4 g once daily and 6-8 ounces of water.  Please ensure you mix this well and do not take sooner than an hour after you take all of your morning medications.  If you begin to have constipation and please let me know so we can decrease the dose of cholestyramine.  If you continue to need Imodium prior to going out to avoid accidents that is fine.  Continue pantoprazole 40 mg twice daily.  Please go to [facility] and pick up supplies to give stool sample to test for pancreatic insufficiency.  I have attached some info to your paperwork today on pancreatic insufficiency.  If you are positive for pancreatic insufficiency then we will provide you some samples of the enzymes and work toward getting you a prescription.  Follow-up in 6 weeks.  It was a pleasure to see you today. I want to create trusting relationships with patients. If you receive a survey regarding your visit,  I greatly appreciate you taking time to fill this out on paper or through your MyChart. I value your feedback.  Brooke Bonito, MSN, FNP-BC, AGACNP-BC [facility]

## 2023-12-12 ENCOUNTER — Ambulatory Visit: Payer: Medicaid Other | Attending: Student | Admitting: Student

## 2023-12-12 ENCOUNTER — Encounter: Payer: Self-pay | Admitting: Student

## 2023-12-12 VITALS — BP 128/78 | HR 61 | Ht 71.0 in | Wt 198.0 lb

## 2023-12-12 DIAGNOSIS — R079 Chest pain, unspecified: Secondary | ICD-10-CM

## 2023-12-12 DIAGNOSIS — N529 Male erectile dysfunction, unspecified: Secondary | ICD-10-CM

## 2023-12-12 DIAGNOSIS — R911 Solitary pulmonary nodule: Secondary | ICD-10-CM | POA: Diagnosis not present

## 2023-12-12 DIAGNOSIS — I10 Essential (primary) hypertension: Secondary | ICD-10-CM | POA: Diagnosis not present

## 2023-12-12 DIAGNOSIS — E785 Hyperlipidemia, unspecified: Secondary | ICD-10-CM | POA: Diagnosis not present

## 2023-12-12 MED ORDER — METOPROLOL TARTRATE 25 MG PO TABS: 12.5000 mg | ORAL_TABLET | Freq: Two times a day (BID) | ORAL | Status: DC

## 2023-12-12 NOTE — Progress Notes (Signed)
 Cardiology Office Note    Date:  12/12/2023  ID:  David Knox, DOB 1965/11/20, MRN [id number] Cardiologist: Vishnu P Mallipeddi, MD    History of Present Illness:    David Knox is a 58 y.o. male with past medical history of chest pain (intermediate-risk NST in 02/2023 due to reports of chest pain but no ischemia by imaging), HTN, HLD, Type II DM, peripheral neuropathy and tobacco use who presents to the office today for 24-month follow-up.  He was last examined by myself in 09/2023 and reported having occasional episodes of chest pain on exertion which would radiate into his left arm at times. Recent NST in 02/2023 showed no evidence of ischemia or infarction, therefore options were reviewed with the patient and a Coronary CTA was recommended for further assessment. No changes were made to his cardiac medications. His Coronary CTA showed a coronary calcium  score of 0 with no evidence of CAD. The radiology over-read did show a 5 mm nodule along the left upper lobe.   In talking with the patient today, he reports still having episodes of chest discomfort and these typically occur with exertion but can improve with movement of his arm up and down. This has been occurring since last year with no change in frequency or severity. He denies any specific orthopnea, PND or pitting edema. No recent dizziness or presyncope. He has been experiencing more issues with erectile dysfunction over the past several months and questions if one of his medications could be the culprit for this. He does report significant issues with peripheral neuropathy and Gabapentin  was recently titrated by his PCP with minimal improvement in this.  Studies Reviewed:   EKG: EKG is not ordered today.   Echocardiogram: 02/2023 IMPRESSIONS     1. Left ventricular ejection fraction, by estimation, is 60 to 65%. The  left ventricle has normal function. The left ventricle has no regional  wall motion abnormalities. Left  ventricular diastolic parameters are  indeterminate.   2. Right ventricular systolic function is normal. The right ventricular  size is normal. Tricuspid regurgitation signal is inadequate for assessing  PA pressure.   3. The mitral valve is grossly normal. Trivial mitral valve  regurgitation.   4. The aortic valve is tricuspid. Aortic valve regurgitation is not  visualized.   5. The inferior vena cava is normal in size with greater than 50%  respiratory variability, suggesting right atrial pressure of 3 mmHg.   Comparison(s): No prior Echocardiogram.   Coronary CTA: 10/2023 IMPRESSION: 1. Coronary calcium  score of 0.   2. Normal coronary origin with left dominance.   3. CAD-RADS 0. No evidence of CAD (0%). Consider non-atherosclerotic causes of chest pain.  IMPRESSION: There is a 5 mm nodule in the LEFT lower lobe which is likely a fissural lymph node. No follow-up needed if patient is low-risk.This recommendation follows the consensus statement: Guidelines for Management of Incidental Pulmonary Nodules Detected on CT Images: From the Fleischner Society 2017; Radiology 2017; 284:228-243.  Physical Exam:   VS:  BP 128/78   Pulse 61   Ht 5' 11 (1.803 m)   Wt 198 lb (89.8 kg)   SpO2 98%   BMI 27.62 kg/m    Wt Readings from Last 3 Encounters:  12/12/23 198 lb (89.8 kg)  12/06/23 196 lb 3.2 oz (89 kg)  09/11/23 190 lb (86.2 kg)     GEN: Well nourished, well developed male appearing in no acute distress NECK: No JVD; No carotid  bruits CARDIAC: RRR, no murmurs, rubs, gallops RESPIRATORY:  Clear to auscultation without rales, wheezing or rhonchi  ABDOMEN: Appears non-distended. No obvious abdominal masses. EXTREMITIES: No clubbing or cyanosis. No pitting edema.  Distal pedal pulses are 2+ bilaterally.   Assessment and Plan:   1. Chest Pain - Recent Coronary CTA in 10/2023 showed no significant plaque and he had a coronary calcium  score of 0. We reviewed in detail  today that his cardiac workup has overall been reassuring. Symptoms could be due to possible microvascular angina and we discussed possibly starting Imdur but he wishes to hold off on additional medications at this time. Also, would see if his ED improves off of Lopressor  as he should not be on Imdur if requiring phosphodiesterase inhibitors in the future.    2. HTN - Blood pressure was initially recorded at 146/90, rechecked and significantly improved to 128/78 after sitting. He is currently on Amlodipine  5 mg daily, Lopressor  25 mg twice daily and Telmisartan  40 mg daily. Will reduce Lopressor  as discussed below and eventually discontinue to see if this helps with his ED. He was provided with a blood pressure log and encouraged to return this in several weeks as we may need to further titrate Amlodipine  or Telmisartan  given discontinuation of Lopressor .  3. HLD - LDL was at 27 when checked in 08/2023. Remains on Atorvastatin  20 mg daily.  4. Pulmonary Nodule - The Radiology over-read by recent Coronary CTA showed a 5 mm nodule along the left lower lobe which was felt to likely be a fissural lymph node. Would anticipate repeat imaging in 1 year.   5. Erectile Dysfunction - He does report worsening symptoms since being started on Lopressor  last year. Given recent reassuring Coronary CTA, will reduce Lopressor  to 12.5 mg BID for 1 week and then stop. If symptoms persist, he can further review with us  or his PCP in regards to starting Sildenafil or Cialis .  Signed, Laymon CHRISTELLA Qua, PA-C

## 2023-12-12 NOTE — Patient Instructions (Addendum)
 Medication Instructions:  Your physician has recommended you make the following change in your medication:  Decrease metoprolol  tartrate to 12.5 mg twice daily for one week; then, stop it Continue all other medications as prescribed  Labwork: none  Testing/Procedures: none  Follow-Up: Your physician recommends that you schedule a follow-up appointment in: 5-6 months  Any Other Special Instructions Will Be Listed Below (If Applicable). Your physician has requested that you regularly monitor and record your blood pressure readings at home. Please use the same machine at the same time of day to check your readings and record them. Please send readings to our office in 3-4 weeks.  If you need a refill on your cardiac medications before your next appointment, please call your pharmacy.

## 2023-12-13 LAB — PANCREATIC ELASTASE, FECAL: Pancreatic Elastase-1, Stool: 273 ug/g (ref >200)

## 2023-12-26 ENCOUNTER — Other Ambulatory Visit: Payer: Self-pay | Admitting: Internal Medicine

## 2023-12-26 ENCOUNTER — Encounter: Payer: Self-pay | Admitting: Internal Medicine

## 2023-12-26 ENCOUNTER — Ambulatory Visit (INDEPENDENT_AMBULATORY_CARE_PROVIDER_SITE_OTHER): Payer: Medicaid Other | Admitting: Internal Medicine

## 2023-12-26 VITALS — BP 144/86 | HR 89 | Ht 71.0 in | Wt 199.0 lb

## 2023-12-26 DIAGNOSIS — R350 Frequency of micturition: Secondary | ICD-10-CM

## 2023-12-26 DIAGNOSIS — E0590 Thyrotoxicosis, unspecified without thyrotoxic crisis or storm: Secondary | ICD-10-CM

## 2023-12-26 DIAGNOSIS — J449 Chronic obstructive pulmonary disease, unspecified: Secondary | ICD-10-CM | POA: Diagnosis not present

## 2023-12-26 DIAGNOSIS — E782 Mixed hyperlipidemia: Secondary | ICD-10-CM

## 2023-12-26 DIAGNOSIS — I10 Essential (primary) hypertension: Secondary | ICD-10-CM

## 2023-12-26 DIAGNOSIS — Z7984 Long term (current) use of oral hypoglycemic drugs: Secondary | ICD-10-CM

## 2023-12-26 DIAGNOSIS — N401 Enlarged prostate with lower urinary tract symptoms: Secondary | ICD-10-CM

## 2023-12-26 DIAGNOSIS — E1140 Type 2 diabetes mellitus with diabetic neuropathy, unspecified: Secondary | ICD-10-CM

## 2023-12-26 MED ORDER — TELMISARTAN-HCTZ 40-12.5 MG PO TABS: 1.0000 | ORAL_TABLET | Freq: Every day | ORAL | 1 refills | Status: DC

## 2023-12-26 MED ORDER — TELMISARTAN 40 MG PO TABS: 40.0000 mg | ORAL_TABLET | Freq: Every day | ORAL | 1 refills | Status: DC

## 2023-12-26 MED ORDER — HYDROCHLOROTHIAZIDE 12.5 MG PO TABS: 12.5000 mg | ORAL_TABLET | Freq: Every day | ORAL | 3 refills | Status: DC

## 2023-12-26 MED ORDER — DULOXETINE HCL 30 MG PO CPEP
30.0000 mg | ORAL_CAPSULE | Freq: Every day | ORAL | 2 refills | Status: DC
Start: 2023-12-26 — End: 2024-01-18

## 2023-12-26 NOTE — Assessment & Plan Note (Addendum)
 His urinary symptoms are likely due to BPH Improved with Flomax 0.4 mg nightly

## 2023-12-26 NOTE — Patient Instructions (Signed)
 Please start taking Telmisartan-hydrochlorothiazide 40-12.5 mg once daily instead of Telmisartan 40 mg. Continue taking Amlodipine 5 mg once daily.  Please start taking Duloxetine 30 mg once daily for neuropathy.  Please continue to take medications as prescribed.  Please continue to follow low carb diet and perform moderate exercise/walking at least 150 mins/week.  Please get fasting blood tests done after 2 weeks.

## 2023-12-26 NOTE — Assessment & Plan Note (Signed)
 BP Readings from Last 1 Encounters:  12/26/23 (!) 144/86   Uncontrolled with Amlodipine 5 mg QD, Telmisartan 40  mg QD  Recently stopped metoprolol 25 mg BID due to ED Started Telmisartan-HCTZ 40-12.5 mg once daily instead Telmisartan 40 mg QD Counseled for compliance with the medications Advised DASH diet and moderate exercise/walking, at least 150 mins/week

## 2023-12-26 NOTE — Assessment & Plan Note (Signed)
He has dyspnea upon exertion Quit smoking in 2022 Likely has chronic bronchitis Albuterol as needed for dyspnea or wheezing

## 2023-12-26 NOTE — Assessment & Plan Note (Addendum)
 Lab Results  Component Value Date   TSH 0.436 (L) 08/13/2023   Normal free T4 Will recheck TSH and free T4

## 2023-12-26 NOTE — Assessment & Plan Note (Signed)
Checked lipid profile On Atorvastatin 20 mg QD

## 2023-12-26 NOTE — Progress Notes (Signed)
 Established Patient Office Visit  Subjective:  Patient ID: David Knox, male    DOB: 1966/04/11  Age: 58 y.o. MRN: [id number]  CC:  Chief Complaint  Patient presents with   Care Management    4 month f/u, reports bp been running high for the past 3 days.     HPI David Knox is a 58 y.o. male with past medical history of HTN, type 2 DM, peripheral neuropathy, GERD, hip arthritis and tobacco abuse who presents for f/u of his chronic medical conditions.  HTN: BP is elevated today. Takes an amlodipine 5 mg QD and telmisartan 40 mg QD regularly.  He had cardiology evaluation for episodes of chest pain.  He was placed on metoprolol 25 mg BID by cardiologist initially, but was recently stopped due to erectile dysfunction.  He denies any recent episodes of chest pain.  Patient denies headache, dizziness, or palpitations.   Type II DM: His HbA1c had worsened to 7.1 in 10/24.  He takes glipizide 5 mg once daily now.  He did not tolerate metformin.  He denies any polyuria or polyphagia currently.  He still complains of bilateral feet burning pain despite taking gabapentin 400 mg twice daily.  He had [place] ABI screening, which showed - Normal bilateral resting ankle-brachial indices. He denies any claudication symptoms.  He had urinary frequency and nocturia.  Denies any dysuria or hematuria. Has noticed postvoid dribbling at times, but has felt better with Flomax now.  His PSA is WNL.  Past Medical History:  Diagnosis Date   Acid reflux    Diabetes mellitus without complication (HCC)    HLD (hyperlipidemia)    HTN (hypertension)     Past Surgical History:  Procedure Laterality Date   BIOPSY  01/18/2023   Procedure: BIOPSY;  Surgeon: Lanelle Bal, DO;  Location: [facility];  Service: Endoscopy;;   CHOLECYSTECTOMY N/A 07/23/2023   Procedure: LAPAROSCOPIC CHOLECYSTECTOMY;  Surgeon: Lucretia Roers, MD;  Location: [facility];  Service: General;  Laterality: N/A;   COLONOSCOPY  WITH PROPOFOL N/A 07/27/2022   Procedure: COLONOSCOPY WITH PROPOFOL;  Surgeon: Lanelle Bal, DO;  Location: [facility];  Service: Endoscopy;  Laterality: N/A;  9:00 am  ASA 2   ESOPHAGOGASTRODUODENOSCOPY  2002   RMR: ulcerative reflux esophagitis, moderate sized hiatal hernia   ESOPHAGOGASTRODUODENOSCOPY (EGD) WITH PROPOFOL N/A 01/18/2023   Procedure: ESOPHAGOGASTRODUODENOSCOPY (EGD) WITH PROPOFOL;  Surgeon: Lanelle Bal, DO;  Location: [facility];  Service: Endoscopy;  Laterality: N/A;  1:45 pm, asa 2   HIP SURGERY Bilateral    as a child   POLYPECTOMY  07/27/2022   Procedure: POLYPECTOMY;  Surgeon: Lanelle Bal, DO;  Location: [facility];  Service: Endoscopy;;   TOOTH EXTRACTION     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   Procedure: TOTAL HIP ARTHROPLASTY;  Surgeon: Vickki Hearing, MD;  Location: [facility];  Service: Orthopedics;  Laterality: Right;   WRIST SURGERY      Family History  Problem Relation Age of Onset   Diabetes Mother    Heart disease Mother    Diabetes Father    Heart disease Father    Colon cancer Neg Hx     Social History   Socioeconomic History   Marital status: Single    Spouse name: Not on file   Number of children: Not on file   Years of education: Not on file   Highest education level: Not on file  Occupational  History   Not on file  Tobacco Use   Smoking status: Every Day    Current packs/day: 0.50    Types: Cigarettes   Smokeless tobacco: Never   Tobacco comments:    5 ciggs per day - 05/07/2023  Vaping Use   Vaping status: Never Used  Substance and Sexual Activity   Alcohol use: Yes    Comment: beer and liquor twice weekly   Drug use: No    Comment: "crack" former- last about 3 years ago.   Sexual activity: Not on file  Other Topics Concern   Not on file  Social History Narrative   Not on file   Social Drivers of Health   Financial Resource Strain: Not on file  Food Insecurity: No Food Insecurity  (07/22/2023)   Hunger Vital Sign    Worried About Running Out of Food in the Last Year: Never true    Ran Out of Food in the Last Year: Never true  Transportation Needs: No Transportation Needs (07/22/2023)   PRAPARE - Administrator, Civil Service (Medical): No    Lack of Transportation (Non-Medical): No  Physical Activity: Not on file  Stress: Not on file  Social Connections: Not on file  Intimate Partner Violence: Not At Risk (07/22/2023)   Humiliation, Afraid, Rape, and Kick questionnaire    Fear of Current or Ex-Partner: No    Emotionally Abused: No    Physically Abused: No    Sexually Abused: No    Outpatient Medications Prior to Visit  Medication Sig Dispense Refill   albuterol (VENTOLIN HFA) 108 (90 Base) MCG/ACT inhaler Inhale 2 puffs into the lungs every 6 (six) hours as needed for wheezing or shortness of breath. 8 g 3   amLODipine (NORVASC) 5 MG tablet TAKE 1 TABLET (5 MG TOTAL) BY MOUTH DAILY. 90 tablet 1   atorvastatin (LIPITOR) 20 MG tablet TAKE 1 TABLET BY MOUTH EVERY DAY 90 tablet 1   blood glucose meter kit and supplies Dispense based on patient and insurance preference. Use up to four times daily as directed. (FOR ICD-10 E10.9, E11.9). 1 each 0   cholestyramine (QUESTRAN) 4 GM/DOSE powder Take 1 packet (4 g total) by mouth daily. 30 packet 0   gabapentin (NEURONTIN) 400 MG capsule Take 1 capsule (400 mg total) by mouth 2 (two) times daily. 60 capsule 5   glipiZIDE (GLUCOTROL XL) 5 MG 24 hr tablet TAKE 1 TABLET BY MOUTH EVERY DAY WITH BREAKFAST 90 tablet 1   ibuprofen (ADVIL) 200 MG tablet Take 400 mg by mouth 2 (two) times daily as needed for headache or moderate pain.     latanoprost (XALATAN) 0.005 % ophthalmic solution Place 1 drop into both eyes at bedtime.     nitroGLYCERIN (NITROSTAT) 0.4 MG SL tablet Place 1 tablet (0.4 mg total) under the tongue every 5 (five) minutes as needed for chest pain. 10 tablet 1   pantoprazole (PROTONIX) 40 MG tablet Take 1  tablet (40 mg total) by mouth 2 (two) times daily. 60 tablet 11   tamsulosin (FLOMAX) 0.4 MG CAPS capsule TAKE 1 CAPSULE BY MOUTH EVERY DAY 90 capsule 1   glipiZIDE (GLUCOTROL XL) 2.5 MG 24 hr tablet TAKE 1 TABLET BY MOUTH DAILY WITH BREAKFAST. 90 tablet 1   telmisartan (MICARDIS) 40 MG tablet Take 1 tablet (40 mg total) by mouth daily. 90 tablet 1   metoprolol tartrate (LOPRESSOR) 25 MG tablet Take 0.5 tablets (12.5 mg total) by mouth 2 (two)  times daily for 7 days. Then stop     No facility-administered medications prior to visit.    Allergies  Allergen Reactions   Asa [Aspirin] Nausea Only    ROS Review of Systems  Constitutional:  Negative for chills and fever.  HENT:  Negative for congestion and sore throat.   Eyes:  Positive for visual disturbance. Negative for pain and discharge.  Respiratory:  Negative for cough and shortness of breath.   Cardiovascular:  Negative for chest pain and palpitations.  Gastrointestinal:  Negative for nausea and vomiting.  Endocrine: Negative for polydipsia and polyuria.  Genitourinary:  Negative for dysuria and hematuria.  Musculoskeletal:  Positive for arthralgias and back pain. Negative for neck pain and neck stiffness.  Skin:  Negative for rash.  Neurological:  Positive for numbness (In left leg and feet). Negative for dizziness, weakness and headaches.  Psychiatric/Behavioral:  Negative for agitation and behavioral problems.       Objective:    Physical Exam Vitals reviewed.  Constitutional:      General: He is not in acute distress.    Appearance: He is not diaphoretic.  HENT:     Head: Normocephalic and atraumatic.     Nose: Nose normal.     Mouth/Throat:     Mouth: Mucous membranes are moist.  Eyes:     General: No scleral icterus.    Extraocular Movements: Extraocular movements intact.  Neck:     Vascular: No carotid bruit.  Cardiovascular:     Rate and Rhythm: Normal rate and regular rhythm.     Pulses: Normal pulses.      Heart sounds: Normal heart sounds. No murmur heard. Pulmonary:     Breath sounds: Normal breath sounds. No wheezing or rales.  Musculoskeletal:     Cervical back: Neck supple. No tenderness.     Right lower leg: No edema.     Left lower leg: No edema.  Skin:    General: Skin is warm.     Findings: No rash.  Neurological:     General: No focal deficit present.     Mental Status: He is alert and oriented to person, place, and time.     Motor: No weakness.  Psychiatric:        Mood and Affect: Mood normal.        Behavior: Behavior normal.     BP (!) 144/86 (BP Location: Left Arm)   Pulse 89   Ht 5\' 11"  (1.803 m)   Wt 199 lb (90.3 kg)   SpO2 97%   BMI 27.75 kg/m  Wt Readings from Last 3 Encounters:  12/26/23 199 lb (90.3 kg)  12/12/23 198 lb (89.8 kg)  12/06/23 196 lb 3.2 oz (89 kg)    Lab Results  Component Value Date   TSH 0.436 (L) 08/13/2023   Lab Results  Component Value Date   WBC 7.7 08/13/2023   HGB 13.2 08/13/2023   HCT 41.5 08/13/2023   MCV 95 08/13/2023   PLT 309 08/13/2023   Lab Results  Component Value Date   NA 137 09/11/2023   K 3.6 09/11/2023   CO2 24 09/11/2023   GLUCOSE 124 (H) 09/11/2023   BUN 11 09/11/2023   CREATININE 1.17 09/11/2023   BILITOT 0.2 08/13/2023   ALKPHOS 102 08/13/2023   AST 25 08/13/2023   ALT 44 08/13/2023   PROT 6.8 08/13/2023   ALBUMIN 4.1 08/13/2023   CALCIUM 8.7 (L) 09/11/2023   ANIONGAP 8 09/11/2023  EGFR 81 08/13/2023   Lab Results  Component Value Date   CHOL 103 08/13/2023   Lab Results  Component Value Date   HDL 27 (L) 08/13/2023   Lab Results  Component Value Date   LDLCALC 27 08/13/2023   Lab Results  Component Value Date   TRIG 335 (H) 08/13/2023   Lab Results  Component Value Date   CHOLHDL 3.8 08/13/2023   Lab Results  Component Value Date   HGBA1C 7.1 (H) 08/13/2023      Assessment & Plan:   Problem List Items Addressed This Visit       Cardiovascular and Mediastinum    Essential hypertension - Primary   BP Readings from Last 1 Encounters:  12/26/23 (!) 144/86   Uncontrolled with Amlodipine 5 mg QD, Telmisartan 40  mg QD  Recently stopped metoprolol 25 mg BID due to ED Started Telmisartan-HCTZ 40-12.5 mg once daily instead Telmisartan 40 mg QD Counseled for compliance with the medications Advised DASH diet and moderate exercise/walking, at least 150 mins/week      Relevant Medications   telmisartan-hydrochlorothiazide (MICARDIS HCT) 40-12.5 MG tablet   Other Relevant Orders   CMP14+EGFR     Respiratory   Chronic obstructive pulmonary disease (HCC)   He has dyspnea upon exertion Quit smoking in 2022 Likely has chronic bronchitis Albuterol as needed for dyspnea or wheezing        Endocrine   Subclinical hyperthyroidism   Lab Results  Component Value Date   TSH 0.436 (L) 08/13/2023   Normal free T4 Will recheck TSH and free T4      Relevant Orders   TSH + free T4   Type 2 diabetes mellitus with diabetic neuropathy, unspecified (HCC)   Lab Results  Component Value Date   HGBA1C 7.1 (H) 08/13/2023   Uncontrolled Did not tolerate metformin On glipizide, increased dose to 5 mg once daily in the last visit Advised to follow diabetic diet On statin and ARB F/u CMP and lipid panel Diabetic eye exam: Advised to follow up with Ophthalmology for diabetic eye exam  On gabapentin 400 mg twice daily for neuropathy, added Cymbalta 30 mg QD due to persistent symptoms If persistent, will refer to Neurology      Relevant Medications   telmisartan-hydrochlorothiazide (MICARDIS HCT) 40-12.5 MG tablet   DULoxetine (CYMBALTA) 30 MG capsule   Other Relevant Orders   CMP14+EGFR   Hemoglobin A1c     Other   Mixed hyperlipidemia (Chronic)   Checked lipid profile On Atorvastatin 20 mg QD      Relevant Medications   telmisartan-hydrochlorothiazide (MICARDIS HCT) 40-12.5 MG tablet   Benign prostatic hyperplasia with urinary frequency   His  urinary symptoms are likely due to BPH Improved with Flomax 0.4 mg nightly       Meds ordered this encounter  Medications   telmisartan-hydrochlorothiazide (MICARDIS HCT) 40-12.5 MG tablet    Sig: Take 1 tablet by mouth daily.    Dispense:  90 tablet    Refill:  1   DULoxetine (CYMBALTA) 30 MG capsule    Sig: Take 1 capsule (30 mg total) by mouth daily.    Dispense:  30 capsule    Refill:  2    Follow-up: Return in about 6 weeks (around 02/06/2024) for HTN.    Anabel Halon, MD

## 2023-12-26 NOTE — Assessment & Plan Note (Addendum)
 Lab Results  Component Value Date   HGBA1C 7.1 (H) 08/13/2023   Uncontrolled Did not tolerate metformin On glipizide, increased dose to 5 mg once daily in the last visit Advised to follow diabetic diet On statin and ARB F/u CMP and lipid panel Diabetic eye exam: Advised to follow up with Ophthalmology for diabetic eye exam  On gabapentin 400 mg twice daily for neuropathy, added Cymbalta 30 mg QD due to persistent symptoms If persistent, will refer to Neurology

## 2023-12-27 ENCOUNTER — Encounter: Payer: Self-pay | Admitting: Internal Medicine

## 2023-12-28 ENCOUNTER — Telehealth: Payer: Self-pay | Admitting: Internal Medicine

## 2023-12-28 MED ORDER — OLMESARTAN-AMLODIPINE-HCTZ 20-5-12.5 MG PO TABS: 1.0000 | ORAL_TABLET | Freq: Every day | ORAL | 3 refills | Status: DC

## 2023-12-28 NOTE — Addendum Note (Signed)
 Addended byTrena Platt on: 12/28/2023 11:41 AM   Modules accepted: Orders

## 2023-12-28 NOTE — Telephone Encounter (Signed)
 Patient came by office,   telmisartan (MICARDIS) 40 MG tablet [[id number]]   Is not covered by insurance, wants to see if can get alternative medication sent in.  Wants a call back

## 2023-12-28 NOTE — Telephone Encounter (Signed)
 Pt reports not able to pick up telmisartan insurance will not approve this needs an alternative

## 2023-12-29 ENCOUNTER — Other Ambulatory Visit: Payer: Self-pay | Admitting: Gastroenterology

## 2023-12-29 ENCOUNTER — Other Ambulatory Visit: Payer: Self-pay | Admitting: Internal Medicine

## 2023-12-29 DIAGNOSIS — K219 Gastro-esophageal reflux disease without esophagitis: Secondary | ICD-10-CM

## 2023-12-31 NOTE — Telephone Encounter (Signed)
 Pt informed

## 2024-01-05 ENCOUNTER — Other Ambulatory Visit: Payer: Self-pay | Admitting: Internal Medicine

## 2024-01-15 MED ORDER — CHOLESTYRAMINE 4 GM/DOSE PO POWD: 4.0000 g | Freq: Every day | ORAL | 1 refills | Status: DC

## 2024-01-15 NOTE — Addendum Note (Signed)
 Addended by: Gelene Mink on: 01/15/2024 11:07 AM   Modules accepted: Orders

## 2024-01-17 ENCOUNTER — Ambulatory Visit: Payer: Medicaid Other | Admitting: Gastroenterology

## 2024-01-18 ENCOUNTER — Other Ambulatory Visit: Payer: Self-pay | Admitting: Internal Medicine

## 2024-01-18 DIAGNOSIS — E1140 Type 2 diabetes mellitus with diabetic neuropathy, unspecified: Secondary | ICD-10-CM

## 2024-01-18 LAB — TSH+FREE T4
Free T4: 0.97 ng/dL (ref 0.82–1.77)
TSH: 0.999 u[IU]/mL (ref 0.450–4.500)

## 2024-01-18 LAB — CMP14+EGFR
ALT: 45 IU/L — ABNORMAL HIGH (ref 0–44)
AST: 35 IU/L (ref 0–40)
Albumin: 4.3 g/dL (ref 3.8–4.9)
Alkaline Phosphatase: 96 IU/L (ref 44–121)
BUN/Creatinine Ratio: 9 (ref 9–20)
BUN: 10 mg/dL (ref 6–24)
Bilirubin Total: 0.2 mg/dL (ref 0.0–1.2)
CO2: 25 mmol/L (ref 20–29)
Calcium: 9.5 mg/dL (ref 8.7–10.2)
Chloride: 100 mmol/L (ref 96–106)
Creatinine, Ser: 1.1 mg/dL (ref 0.76–1.27)
Globulin, Total: 2.6 g/dL (ref 1.5–4.5)
Glucose: 174 mg/dL — ABNORMAL HIGH (ref 70–99)
Potassium: 3.8 mmol/L (ref 3.5–5.2)
Sodium: 138 mmol/L (ref 134–144)
Total Protein: 6.9 g/dL (ref 6.0–8.5)
eGFR: 79 mL/min/{1.73_m2} (ref >59)

## 2024-01-18 LAB — HEMOGLOBIN A1C
Est. average glucose Bld gHb Est-mCnc: 197 mg/dL
Hgb A1c MFr Bld: 8.5 % — ABNORMAL HIGH (ref 4.8–5.6)

## 2024-01-22 NOTE — Progress Notes (Unsigned)
 GI Office Note    Referring Provider: Anabel Halon, MD Primary Care Physician:  Anabel Halon, MD Primary Gastroenterologist: Hennie Duos. Marletta Lor, DO  Date:  01/23/2024  ID:  David Knox, DOB 03-06-1966, MRN [id number]  Chief Complaint   Chief Complaint  Patient presents with   Follow-up    Follow up. Still having some diarrhea.    History of Present Illness  David Knox is a 58 y.o. male with a history of diabetes, HLD, HTN, GERD, prior cocaine abuse, and cholecystitis s/p cholecystectomy September 2024 presenting today for follow-up chronic diarrhea.  OV 06/29/2022 to discuss scheduling first-ever screening colonoscopy.  He denied any melena, BRBPR, unintentional weight loss, family history of colon cancer.  Did report postprandial bowel movements stating is a chronic issue.  Patient has a bowel movement 30 minutes after eating.  Mostly formed stools, sometimes runny.  Dairy products can cause looser stools.  Sometimes having 5-6 BMs daily without nocturnal stools.  Denied abdominal pain.  GERD controlled with omeprazole 20 mg over-the-counter daily.  Patient reported not being bothered by stools, lactose intolerance suspected.  Advised screening for celiac disease with serologies.  Prior thyroid levels within normal limits. Scheduled for colonoscopy (urine drug screen advised).  Advised Lactaid prior to dairy consumption and advised to stop omeprazole start pantoprazole in case omeprazole causes frequent diarrhea.   IgA elevated, however TTG IgA negative.   Colonoscopy 07/27/2022: -Transverse and descending diverticulosis -4 mm polyp in descending colon -Diminutive hyperplastic polyp, negative for dysplasia -Repeat colonoscopy in 10 years   OV 10/26/22. Having loose BM after every meal. Occurring sooner after meals. Still has gallbladder and reports this is same issues as his wife. Tries to avoid dairy products. Stools are bristol 5-7. Started metformin in July /August  2023 after hip surgery. Originally stated symptoms began in July/August of 2023. GERD controlled. Advised imodium as needed. Advised to discuss stopping metformin with PCP. Continue pantoprazole daily. Advised potential further workup with fecal elastase and possible abdominal imaging if imodium and stopping metformin not helpful. FECAL ELASTASE NEVER PERFORMED.    EGD 01/18/23: -3 cm hiatal hernia -Grade a reflux esophagitis without bleeding s/p biopsy -Gastritis s/p biopsy -Duodenitis with history of biopsy -Pathology -duodenal biopsies with foveolar metaplasia suggesting peptic injury gastric biopsies with reactive gastropathy, negative for H. pylori, GE junction with mild reflux changes - Advised PPI BID   PCP recommended decreasing his metformin from 1000 mg twice daily to 500 mg daily given his reports of diarrhea.  He was advised to continue to take Imodium as needed.  Also recommended over-the-counter probiotic.   OV 06/13/2023.  Patient reported he was taking Imodium once daily and having 4-5 bowel movements per day, usually in small amounts.  Usually occurs within 10-15 minutes of meals.  Stools described as [place] 6-7.  Denied any eating dairy products, reports pieces of lettuce in his stool.  Denied any nausea or vomiting.  GERD pretty well-controlled without dysphagia.  Again advised to check fecal elastase, continue Imodium 1-2 times a day and could schedule first dose every morning.  Continue PPI twice daily.  Advised we could consider trial of pancreatic enzymes if elastase is low versus cholestyramine.  Encouraged to continue to avoid lactose.   Hospitalization at [facility] 9/15 - 9/18.  He presented with 5 days of RUQ pain as well as chest pain and vomiting.  He was diagnosed with acute cholecystitis and underwent cholecystectomy with Dr. Henreitta Leber with need for JP  drain placement given gallbladder was thickened and distended and had purulent drainage.  He was noted to have significant  impaction of stones and inflammation within the gallbladder.  He was treated with Augmentin.   He was seen by Dr. Henreitta Leber on 9/26 for follow-up post cholecystectomy.  Patient reportedly feeling well and had minimal output from JP drain therefore it was removed and dressing placed.   OV 08/29/23.  Still having bowel movements about 30 minutes after meals, usually depending on what he eats.  Usually does occur with fatty or greasy meals.  Reflux doing good as long as he takes medication.  Denies any significant breakthrough symptoms.  Reported good appetite.  Does have some shortness of breath and chest tightness with exertion, going to see cardiology the following day.  Recommended Imodium if going out, following a gallbladder eating plan and continuing pantoprazole 40 mg twice daily.  Last office visit 12/06/2023.  Having lots of diarrhea, having to take Pepto at times and then would have black stool afterwards.  Stools have been more watery.  Having frequent bowel movements about 5-10 minutes post meals.  Reported some mild weight gain, no weight loss.  Denied nausea, lack of appetite, early satiety, vomiting, or abdominal pain.  Does report some increased abdominal distention at times.  GERD well-controlled with pantoprazole.  Advised to check fecal elastase and do a trial of cholestyramine 4 g once daily and increase as needed.  Advised Imodium for severe refractory diarrhea.  Continue PPI twice daily and follow low-fat diet.  Follow-up 6 weeks.  Fecal elastase 273.  More than normal.  Advise continue cholestyramine.  Today:  Still has some diarrhea. Will take 1/2 imodium if he knows he's going out to eat that way he can make it home.   No abdominal pain. Stools have firmed up more. Stools are not hard at all.   Urgency is slightly better. Food is staying with him longer.   Wt Readings from Last 3 Encounters:  01/23/24 194 lb 12.8 oz (88.4 kg)  12/26/23 199 lb (90.3 kg)  12/12/23 198 lb (89.8  kg)    Current Outpatient Medications  Medication Sig Dispense Refill   albuterol (VENTOLIN HFA) 108 (90 Base) MCG/ACT inhaler Inhale 2 puffs into the lungs every 6 (six) hours as needed for wheezing or shortness of breath. 8 g 3   atorvastatin (LIPITOR) 20 MG tablet TAKE 1 TABLET BY MOUTH EVERY DAY 90 tablet 1   blood glucose meter kit and supplies Dispense based on patient and insurance preference. Use up to four times daily as directed. (FOR ICD-10 E10.9, E11.9). 1 each 0   cholestyramine (QUESTRAN) 4 GM/DOSE powder Take 1 packet (4 g total) by mouth daily. 90 g 1   DULoxetine (CYMBALTA) 30 MG capsule TAKE 1 CAPSULE BY MOUTH EVERY DAY 90 capsule 1   gabapentin (NEURONTIN) 400 MG capsule Take 1 capsule (400 mg total) by mouth 2 (two) times daily. 60 capsule 5   glipiZIDE (GLUCOTROL XL) 5 MG 24 hr tablet TAKE 1 TABLET BY MOUTH EVERY DAY WITH BREAKFAST 90 tablet 1   ibuprofen (ADVIL) 200 MG tablet Take 400 mg by mouth 2 (two) times daily as needed for headache or moderate pain.     latanoprost (XALATAN) 0.005 % ophthalmic solution Place 1 drop into both eyes at bedtime.     metoprolol tartrate (LOPRESSOR) 25 MG tablet TAKE 1 TABLET BY MOUTH (2) TIMES A DAY. 180 tablet 2   nitroGLYCERIN (NITROSTAT) 0.4 MG  SL tablet Place 1 tablet (0.4 mg total) under the tongue every 5 (five) minutes as needed for chest pain. 10 tablet 1   Olmesartan-amLODIPine-HCTZ 20-5-12.5 MG TABS Take 1 tablet by mouth daily. 30 tablet 3   pantoprazole (PROTONIX) 40 MG tablet TAKE 1 TABLET BY MOUTH TWICE A DAY 60 tablet 11   tamsulosin (FLOMAX) 0.4 MG CAPS capsule TAKE 1 CAPSULE BY MOUTH EVERY DAY (Patient not taking: Reported on 01/23/2024) 90 capsule 1   No current facility-administered medications for this visit.    Past Medical History:  Diagnosis Date   Acid reflux    Diabetes mellitus without complication (HCC)    HLD (hyperlipidemia)    HTN (hypertension)     Past Surgical History:  Procedure Laterality Date    BIOPSY  01/18/2023   Procedure: BIOPSY;  Surgeon: Lanelle Bal, DO;  Location: [facility];  Service: Endoscopy;;   CHOLECYSTECTOMY N/A 07/23/2023   Procedure: LAPAROSCOPIC CHOLECYSTECTOMY;  Surgeon: Lucretia Roers, MD;  Location: [facility];  Service: General;  Laterality: N/A;   COLONOSCOPY WITH PROPOFOL N/A 07/27/2022   Procedure: COLONOSCOPY WITH PROPOFOL;  Surgeon: Lanelle Bal, DO;  Location: [facility];  Service: Endoscopy;  Laterality: N/A;  9:00 am  ASA 2   ESOPHAGOGASTRODUODENOSCOPY  2002   RMR: ulcerative reflux esophagitis, moderate sized hiatal hernia   ESOPHAGOGASTRODUODENOSCOPY (EGD) WITH PROPOFOL N/A 01/18/2023   Procedure: ESOPHAGOGASTRODUODENOSCOPY (EGD) WITH PROPOFOL;  Surgeon: Lanelle Bal, DO;  Location: [facility];  Service: Endoscopy;  Laterality: N/A;  1:45 pm, asa 2   HIP SURGERY Bilateral    as a child   POLYPECTOMY  07/27/2022   Procedure: POLYPECTOMY;  Surgeon: Lanelle Bal, DO;  Location: [facility];  Service: Endoscopy;;   TOOTH EXTRACTION     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   Procedure: TOTAL HIP ARTHROPLASTY;  Surgeon: Vickki Hearing, MD;  Location: [facility];  Service: Orthopedics;  Laterality: Right;   WRIST SURGERY      Family History  Problem Relation Age of Onset   Diabetes Mother    Heart disease Mother    Diabetes Father    Heart disease Father    Colon cancer Neg Hx     Allergies as of 01/23/2024 - Review Complete 01/23/2024  Allergen Reaction Noted   Asa [aspirin] Nausea Only 05/18/2022    Social History   Socioeconomic History   Marital status: Single    Spouse name: Not on file   Number of children: Not on file   Years of education: Not on file   Highest education level: Not on file  Occupational History   Not on file  Tobacco Use   Smoking status: Every Day    Current packs/day: 0.50    Types: Cigarettes   Smokeless tobacco: Never   Tobacco comments:    5 ciggs per day - 05/07/2023   Vaping Use   Vaping status: Never Used  Substance and Sexual Activity   Alcohol use: Yes    Comment: beer and liquor twice weekly   Drug use: No    Comment: "crack" former- last about 3 years ago.   Sexual activity: Not on file  Other Topics Concern   Not on file  Social History Narrative   Not on file   Social Drivers of Health   Financial Resource Strain: Not on file  Food Insecurity: No Food Insecurity (07/22/2023)   Hunger Vital Sign    Worried About Running  Out of Food in the Last Year: Never true    Ran Out of Food in the Last Year: Never true  Transportation Needs: No Transportation Needs (07/22/2023)   PRAPARE - Administrator, Civil Service (Medical): No    Lack of Transportation (Non-Medical): No  Physical Activity: Not on file  Stress: Not on file  Social Connections: Not on file     Review of Systems   Gen: Denies fever, chills, anorexia. Denies fatigue, weakness, weight loss.  CV: Denies chest pain, palpitations, syncope, peripheral edema, and claudication. Resp: Denies dyspnea at rest, cough, wheezing, coughing up blood, and pleurisy. GI: See HPI Derm: Denies rash, itching, dry skin Psych: Denies depression, anxiety, memory loss, confusion. No homicidal or suicidal ideation.  Heme: Denies bruising, bleeding, and enlarged lymph nodes.  Physical Exam   BP 128/87 (BP Location: Right Arm, Patient Position: Sitting, Cuff Size: Large)   Pulse 90   Temp 98.2 F (36.8 C) (Temporal)   Ht 5\' 11"  (1.803 m)   Wt 194 lb 12.8 oz (88.4 kg)   BMI 27.17 kg/m   General:   Alert and oriented. No distress noted. Pleasant and cooperative.  Head:  Normocephalic and atraumatic. Eyes:  Conjuctiva clear without scleral icterus. Mouth:  Oral mucosa pink and moist. Good dentition. No lesions. Abdomen:  +BS, soft, non-tender and non-distended. No rebound or guarding. No HSM or masses noted. Rectal: deferred Msk:  Symmetrical without gross deformities. Normal  posture. Extremities:  Without edema. Neurologic:  Alert and  oriented x4 Psych:  Alert and cooperative. Normal mood and affect.  Assessment  David Knox is a 58 y.o. male with a history of diabetes, HLD, HTN, GERD, prior cocaine abuse, and cholecystitis s/p cholecystectomy September 2024 presenting today for follow-up of chronic diarrhea.  Chronic diarrhea: Possible bile acid related.  History of cholecystectomy September 2024.  Diarrhea predominantly occurring postprandially.  This was occurring even prior to bout of cholecystitis however underwent cholecystectomy in September 2024 with some complication postsurgery.  Fecal elastase was low normal.  Previously given cholestyramine 4 g to take daily, this caused some mild period of time without bowel movement therefore now has been taking every other day, stools have been more firm and urgency is somewhat improved.  Still taking half Imodium as needed prior to going out.  Having 1-2 bowel movements daily now versus 5-6.  Diarrhea could be multifactorial in the setting of bile acid as well as possible mild pancreatic insufficiency given low normal fecal elastase.  Discussed that if symptoms were to recur despite cholestyramine we could trial pancreatic enzymes.  For now he is happy with his current bowel habits.  GERD: Well-controlled with pantoprazole 40 mg twice daily.  Denies nausea, vomiting, dysphagia.   PLAN   Continues cholestyramine 4g every other day Continue half Imodium as needed prior to going out. Continue pantoprazole 40 mg twice daily Could consider trial of Zenpep in the future if cholestyramine longer effective. Low-fat diet Follow-up 6 months    Brooke Bonito, MSN, FNP-BC, AGACNP-BC [facility]

## 2024-01-23 ENCOUNTER — Ambulatory Visit (INDEPENDENT_AMBULATORY_CARE_PROVIDER_SITE_OTHER): Admitting: Gastroenterology

## 2024-01-23 ENCOUNTER — Encounter: Payer: Self-pay | Admitting: Gastroenterology

## 2024-01-23 VITALS — BP 128/87 | HR 90 | Temp 98.2°F | Ht 71.0 in | Wt 194.8 lb

## 2024-01-23 DIAGNOSIS — K219 Gastro-esophageal reflux disease without esophagitis: Secondary | ICD-10-CM

## 2024-01-23 DIAGNOSIS — Z9049 Acquired absence of other specified parts of digestive tract: Secondary | ICD-10-CM | POA: Diagnosis not present

## 2024-01-23 DIAGNOSIS — K529 Noninfective gastroenteritis and colitis, unspecified: Secondary | ICD-10-CM

## 2024-01-23 NOTE — Patient Instructions (Addendum)
 Continue cholestyramine 4 g every other day.  You can titrate this as needed based off your bowel frequency.  If you begin to have more frequent diarrhea you can go back to every day.  If you are feeling more constipated you can use the cholestyramine every few days as needed.  Continue pantoprazole 40 mg twice daily.  Continue low-fat diet.  Follow-up 6 months, sooner if needed.  It was a pleasure to see you today. I want to create trusting relationships with patients. If you receive a survey regarding your visit,  I greatly appreciate you taking time to fill this out on paper or through your MyChart. I value your feedback.  Brooke Bonito, MSN, FNP-BC, AGACNP-BC [facility]

## 2024-01-30 ENCOUNTER — Other Ambulatory Visit: Payer: Self-pay | Admitting: Internal Medicine

## 2024-01-30 DIAGNOSIS — I10 Essential (primary) hypertension: Secondary | ICD-10-CM

## 2024-02-18 ENCOUNTER — Ambulatory Visit: Payer: Medicaid Other | Admitting: Internal Medicine

## 2024-02-18 ENCOUNTER — Encounter: Payer: Self-pay | Admitting: Internal Medicine

## 2024-02-18 VITALS — BP 136/86 | HR 86 | Ht 71.0 in | Wt 193.0 lb

## 2024-02-18 DIAGNOSIS — E782 Mixed hyperlipidemia: Secondary | ICD-10-CM | POA: Diagnosis not present

## 2024-02-18 DIAGNOSIS — I10 Essential (primary) hypertension: Secondary | ICD-10-CM | POA: Diagnosis not present

## 2024-02-18 DIAGNOSIS — E1159 Type 2 diabetes mellitus with other circulatory complications: Secondary | ICD-10-CM

## 2024-02-18 DIAGNOSIS — M25511 Pain in right shoulder: Secondary | ICD-10-CM

## 2024-02-18 DIAGNOSIS — E1140 Type 2 diabetes mellitus with diabetic neuropathy, unspecified: Secondary | ICD-10-CM | POA: Diagnosis not present

## 2024-02-18 DIAGNOSIS — Z7984 Long term (current) use of oral hypoglycemic drugs: Secondary | ICD-10-CM

## 2024-02-18 MED ORDER — OLMESARTAN-AMLODIPINE-HCTZ 20-5-12.5 MG PO TABS: 1.0000 | ORAL_TABLET | Freq: Every day | ORAL | 3 refills | Status: DC

## 2024-02-18 MED ORDER — GLIPIZIDE 5 MG PO TABS
5.0000 mg | ORAL_TABLET | Freq: Two times a day (BID) | ORAL | 3 refills | Status: DC
Start: 2024-02-18 — End: 2024-05-23

## 2024-02-18 MED ORDER — CYCLOBENZAPRINE HCL 5 MG PO TABS: 5.0000 mg | ORAL_TABLET | Freq: Two times a day (BID) | ORAL | 1 refills | Status: AC | PRN

## 2024-02-18 NOTE — Assessment & Plan Note (Signed)
Checked lipid profile On Atorvastatin 20 mg QD

## 2024-02-18 NOTE — Progress Notes (Signed)
 Established Patient Office Visit  Subjective:  Patient ID: David Knox, male    DOB: [birth year]  Age: 58 y.o. MRN: [id number]  CC:  Chief Complaint  Patient presents with   Care Management    6 week f/u, reports ongoing shoulder pain for 2 weeks, unsure what happened.     HPI David Knox is a 58 y.o. male with past medical history of HTN, type 2 DM, peripheral neuropathy, GERD, hip arthritis and tobacco abuse who presents for f/u of his chronic medical conditions.  HTN: BP is wnl today, but has been elevated at home.  He was given olmesartan-amlodipine-HCTZ 20-5-12.5 mg QD, which was controlling his blood pressure well, but could not get refills.  He started taking  amlodipine 5 mg QD and telmisartan 40 mg QD regularly, but his BP has been elevated at times.  He had cardiology evaluation for episodes of chest pain.  He was placed on metoprolol 25 mg BID by cardiologist initially, but was recently stopped due to erectile dysfunction.  He denies any recent episodes of chest pain.  Patient denies headache, dizziness, or palpitations.  Type II DM: His HbA1c had worsened to 8.5 from 7.1 in 10/24.  He takes glipizide 5 mg once daily now.  He did not tolerate metformin.  He denies any polyuria or polyphagia currently.  He admits that he needs to improve diet.  He also takes about 4 alcoholic drinks almost every other day.  He still complains of bilateral feet burning pain despite taking gabapentin 400 mg twice daily.  He has noticed mild improvement with Cymbalta.  He had [place] ABI screening, which showed - Normal bilateral resting ankle-brachial indices. He denies any claudication symptoms.  He had urinary frequency and nocturia.  Denies any dysuria or hematuria. Has noticed postvoid dribbling at times, but has felt better with Flomax now.  His PSA is WNL.  He reports right shoulder/scapular area pain since 02/04/24.  Pain is constant, dull, nonradiating.  Denies any recent injury or fall.   He has tried applying muscle rubbing cream with some relief.  Denies any numbness or tingling of the UE.  Past Medical History:  Diagnosis Date   Acid reflux    Diabetes mellitus without complication (HCC)    HLD (hyperlipidemia)    HTN (hypertension)     Past Surgical History:  Procedure Laterality Date   BIOPSY  01/18/2023   Procedure: BIOPSY;  Surgeon: Lanelle Bal, DO;  Location: [facility];  Service: Endoscopy;;   CHOLECYSTECTOMY N/A 07/23/2023   Procedure: LAPAROSCOPIC CHOLECYSTECTOMY;  Surgeon: Lucretia Roers, MD;  Location: [facility];  Service: General;  Laterality: N/A;   COLONOSCOPY WITH PROPOFOL N/A 07/27/2022   Procedure: COLONOSCOPY WITH PROPOFOL;  Surgeon: Lanelle Bal, DO;  Location: [facility];  Service: Endoscopy;  Laterality: N/A;  9:00 am  ASA 2   ESOPHAGOGASTRODUODENOSCOPY  2002   RMR: ulcerative reflux esophagitis, moderate sized hiatal hernia   ESOPHAGOGASTRODUODENOSCOPY (EGD) WITH PROPOFOL N/A 01/18/2023   Procedure: ESOPHAGOGASTRODUODENOSCOPY (EGD) WITH PROPOFOL;  Surgeon: Lanelle Bal, DO;  Location: [facility];  Service: Endoscopy;  Laterality: N/A;  1:45 pm, asa 2   HIP SURGERY Bilateral    as a child   POLYPECTOMY  07/27/2022   Procedure: POLYPECTOMY;  Surgeon: Lanelle Bal, DO;  Location: [facility];  Service: Endoscopy;;   TOOTH EXTRACTION     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   Procedure: TOTAL HIP ARTHROPLASTY;  Surgeon: Darrin Emerald, MD;  Location: [facility];  Service: Orthopedics;  Laterality: Right;   WRIST SURGERY      Family History  Problem Relation Age of Onset   Diabetes Mother    Heart disease Mother    Diabetes Father    Heart disease Father    Colon cancer Neg Hx     Social History   Socioeconomic History   Marital status: Single    Spouse name: Not on file   Number of children: Not on file   Years of education: Not on file   Highest education level: Not on file  Occupational History    Not on file  Tobacco Use   Smoking status: Every Day    Current packs/day: 0.50    Types: Cigarettes   Smokeless tobacco: Never   Tobacco comments:    5 ciggs per day - 05/07/2023  Vaping Use   Vaping status: Never Used  Substance and Sexual Activity   Alcohol use: Yes    Comment: beer and liquor twice weekly   Drug use: No    Comment: "crack" former- last about 3 years ago.   Sexual activity: Not on file  Other Topics Concern   Not on file  Social History Narrative   Not on file   Social Drivers of Health   Financial Resource Strain: Not on file  Food Insecurity: No Food Insecurity (07/22/2023)   Hunger Vital Sign    Worried About Running Out of Food in the Last Year: Never true    Ran Out of Food in the Last Year: Never true  Transportation Needs: No Transportation Needs (07/22/2023)   PRAPARE - Administrator, Civil Service (Medical): No    Lack of Transportation (Non-Medical): No  Physical Activity: Not on file  Stress: Not on file  Social Connections: Not on file  Intimate Partner Violence: Not At Risk (07/22/2023)   Humiliation, Afraid, Rape, and Kick questionnaire    Fear of Current or Ex-Partner: No    Emotionally Abused: No    Physically Abused: No    Sexually Abused: No    Outpatient Medications Prior to Visit  Medication Sig Dispense Refill   albuterol (VENTOLIN HFA) 108 (90 Base) MCG/ACT inhaler Inhale 2 puffs into the lungs every 6 (six) hours as needed for wheezing or shortness of breath. 8 g 3   atorvastatin (LIPITOR) 20 MG tablet TAKE 1 TABLET BY MOUTH EVERY DAY 90 tablet 1   blood glucose meter kit and supplies Dispense based on patient and insurance preference. Use up to four times daily as directed. (FOR ICD-10 E10.9, E11.9). 1 each 0   cholestyramine (QUESTRAN) 4 GM/DOSE powder Take 1 packet (4 g total) by mouth daily. 90 g 1   DULoxetine (CYMBALTA) 30 MG capsule TAKE 1 CAPSULE BY MOUTH EVERY DAY 90 capsule 1   gabapentin (NEURONTIN) 400  MG capsule Take 1 capsule (400 mg total) by mouth 2 (two) times daily. 60 capsule 5   ibuprofen (ADVIL) 200 MG tablet Take 400 mg by mouth 2 (two) times daily as needed for headache or moderate pain.     latanoprost (XALATAN) 0.005 % ophthalmic solution Place 1 drop into both eyes at bedtime.     metoprolol tartrate (LOPRESSOR) 25 MG tablet TAKE 1 TABLET BY MOUTH (2) TIMES A DAY. 180 tablet 2   nitroGLYCERIN (NITROSTAT) 0.4 MG SL tablet Place 1 tablet (0.4 mg total) under the tongue every 5 (five) minutes as  needed for chest pain. 10 tablet 1   pantoprazole (PROTONIX) 40 MG tablet TAKE 1 TABLET BY MOUTH TWICE A DAY 60 tablet 11   tamsulosin (FLOMAX) 0.4 MG CAPS capsule TAKE 1 CAPSULE BY MOUTH EVERY DAY 90 capsule 1   glipiZIDE (GLUCOTROL XL) 5 MG 24 hr tablet TAKE 1 TABLET BY MOUTH EVERY DAY WITH BREAKFAST 90 tablet 1   Olmesartan-amLODIPine-HCTZ 20-5-12.5 MG TABS Take 1 tablet by mouth daily. 30 tablet 3   No facility-administered medications prior to visit.    Allergies  Allergen Reactions   Asa [Aspirin] Nausea Only    ROS Review of Systems  Constitutional:  Negative for chills and fever.  HENT:  Negative for congestion and sore throat.   Eyes:  Positive for visual disturbance. Negative for pain and discharge.  Respiratory:  Negative for cough and shortness of breath.   Cardiovascular:  Negative for chest pain and palpitations.  Gastrointestinal:  Negative for nausea and vomiting.  Endocrine: Negative for polydipsia and polyuria.  Genitourinary:  Negative for dysuria and hematuria.  Musculoskeletal:  Positive for arthralgias and back pain. Negative for neck pain and neck stiffness.       Right scapular area pain  Skin:  Negative for rash.  Neurological:  Positive for numbness (In left leg and feet). Negative for dizziness, weakness and headaches.  Psychiatric/Behavioral:  Negative for agitation and behavioral problems.       Objective:    Physical Exam Vitals reviewed.   Constitutional:      General: He is not in acute distress.    Appearance: He is not diaphoretic.  HENT:     Head: Normocephalic and atraumatic.     Nose: Nose normal.     Mouth/Throat:     Mouth: Mucous membranes are moist.  Eyes:     General: No scleral icterus.    Extraocular Movements: Extraocular movements intact.  Neck:     Vascular: No carotid bruit.  Cardiovascular:     Rate and Rhythm: Normal rate and regular rhythm.     Pulses: Normal pulses.     Heart sounds: Normal heart sounds. No murmur heard. Pulmonary:     Breath sounds: Normal breath sounds. No wheezing or rales.  Musculoskeletal:     Cervical back: Neck supple. No tenderness.     Right lower leg: No edema.     Left lower leg: No edema.     Comments: Right sided lateral scapular area tenderness  Skin:    General: Skin is warm.     Findings: No rash.  Neurological:     General: No focal deficit present.     Mental Status: He is alert and oriented to person, place, and time.     Motor: No weakness.  Psychiatric:        Mood and Affect: Mood normal.        Behavior: Behavior normal.     BP 136/86   Pulse 86   Ht 5\' 11"  (1.803 m)   Wt 193 lb (87.5 kg)   SpO2 98%   BMI 26.92 kg/m  Wt Readings from Last 3 Encounters:  02/18/24 193 lb (87.5 kg)  01/23/24 194 lb 12.8 oz (88.4 kg)  12/26/23 199 lb (90.3 kg)    Lab Results  Component Value Date   TSH 0.999 01/17/2024   Lab Results  Component Value Date   WBC 7.7 08/13/2023   HGB 13.2 08/13/2023   HCT 41.5 08/13/2023   MCV 95 08/13/2023  PLT 309 08/13/2023   Lab Results  Component Value Date   NA 138 01/17/2024   K 3.8 01/17/2024   CO2 25 01/17/2024   GLUCOSE 174 (H) 01/17/2024   BUN 10 01/17/2024   CREATININE 1.10 01/17/2024   BILITOT 0.2 01/17/2024   ALKPHOS 96 01/17/2024   AST 35 01/17/2024   ALT 45 (H) 01/17/2024   PROT 6.9 01/17/2024   ALBUMIN 4.3 01/17/2024   CALCIUM 9.5 01/17/2024   ANIONGAP 8 09/11/2023   EGFR 79 01/17/2024    Lab Results  Component Value Date   CHOL 103 08/13/2023   Lab Results  Component Value Date   HDL 27 (L) 08/13/2023   Lab Results  Component Value Date   LDLCALC 27 08/13/2023   Lab Results  Component Value Date   TRIG 335 (H) 08/13/2023   Lab Results  Component Value Date   CHOLHDL 3.8 08/13/2023   Lab Results  Component Value Date   HGBA1C 8.5 (H) 01/17/2024      Assessment & Plan:   Problem List Items Addressed This Visit       Cardiovascular and Mediastinum   Essential hypertension - Primary   BP Readings from Last 1 Encounters:  02/18/24 136/86   Well-controlled with olmesartan-amlodipine-hydrochlorothiazide 20-5-12.5 mg once daily, refilled Recently stopped metoprolol 25 mg BID due to ED Counseled for compliance with the medications Advised DASH diet and moderate exercise/walking, at least 150 mins/week      Relevant Medications   Olmesartan-amLODIPine-HCTZ 20-5-12.5 MG TABS     Endocrine   Type 2 diabetes mellitus with diabetic neuropathy, unspecified (HCC)   Lab Results  Component Value Date   HGBA1C 8.5 (H) 01/17/2024   Uncontrolled Did not tolerate metformin On glipizide, increased dose to 5 mg BID Advised to follow diabetic diet - needs to cut down sweets and alcohol intake On statin and ARB F/u CMP and lipid panel Diabetic eye exam: Advised to follow up with Ophthalmology for diabetic eye exam  On gabapentin 400 mg twice daily for neuropathy, added Cymbalta 30 mg QD due to persistent symptoms If persistent, will refer to Neurology      Relevant Medications   Olmesartan-amLODIPine-HCTZ 20-5-12.5 MG TABS   glipiZIDE (GLUCOTROL) 5 MG tablet     Other   Mixed hyperlipidemia (Chronic)   Checked lipid profile On Atorvastatin 20 mg QD      Relevant Medications   Olmesartan-amLODIPine-HCTZ 20-5-12.5 MG TABS   Acute pain of right shoulder   Unrelated to injury Likely due to trapezius or infraspinatus muscle strain Flexeril as needed  for muscle spasms Ibuprofen as needed for pain Has scheduled appointment with orthopedic surgeon      Relevant Medications   cyclobenzaprine (FLEXERIL) 5 MG tablet    Meds ordered this encounter  Medications   Olmesartan-amLODIPine-HCTZ 20-5-12.5 MG TABS    Sig: Take 1 tablet by mouth daily.    Dispense:  30 tablet    Refill:  3    PLEASE DISCONTINUE SEPARATE TELMISARTAN, HCTZ AND AMLODIPINE.   glipiZIDE (GLUCOTROL) 5 MG tablet    Sig: Take 1 tablet (5 mg total) by mouth 2 (two) times daily before a meal.    Dispense:  60 tablet    Refill:  3   cyclobenzaprine (FLEXERIL) 5 MG tablet    Sig: Take 1 tablet (5 mg total) by mouth 2 (two) times daily as needed.    Dispense:  30 tablet    Refill:  1    Follow-up: Return  in about 3 months (around 05/19/2024) for HTN and DM.    Meldon Sport, MD

## 2024-02-18 NOTE — Patient Instructions (Signed)
 Please start taking Olmesartan-amlodipine-HCTZ 20-5-12.5 mg once daily for blood pressure.  Please start taking Glipizide 5 mg twice daily for diabetes.  Please take Flexeril as needed for muscle spasms.  Please follow low carb diet and perform moderate exercise/walking at least 150 mins/week.

## 2024-02-18 NOTE — Assessment & Plan Note (Signed)
 Unrelated to injury Likely due to trapezius or infraspinatus muscle strain Flexeril as needed for muscle spasms Ibuprofen as needed for pain Has scheduled appointment with orthopedic surgeon

## 2024-02-18 NOTE — Assessment & Plan Note (Addendum)
 Lab Results  Component Value Date   HGBA1C 8.5 (H) 01/17/2024   Uncontrolled Did not tolerate metformin On glipizide, increased dose to 5 mg BID Advised to follow diabetic diet - needs to cut down sweets and alcohol intake On statin and ARB F/u CMP and lipid panel Diabetic eye exam: Advised to follow up with Ophthalmology for diabetic eye exam  On gabapentin 400 mg twice daily for neuropathy, added Cymbalta 30 mg QD due to persistent symptoms If persistent, will refer to Neurology

## 2024-02-18 NOTE — Assessment & Plan Note (Addendum)
 BP Readings from Last 1 Encounters:  02/18/24 136/86   Well-controlled with olmesartan-amlodipine-hydrochlorothiazide 20-5-12.5 mg once daily, refilled Recently stopped metoprolol 25 mg BID due to ED Counseled for compliance with the medications Advised DASH diet and moderate exercise/walking, at least 150 mins/week

## 2024-02-25 ENCOUNTER — Encounter: Payer: Self-pay | Admitting: Orthopedic Surgery

## 2024-02-25 ENCOUNTER — Other Ambulatory Visit (INDEPENDENT_AMBULATORY_CARE_PROVIDER_SITE_OTHER): Payer: Self-pay

## 2024-02-25 ENCOUNTER — Ambulatory Visit (INDEPENDENT_AMBULATORY_CARE_PROVIDER_SITE_OTHER): Admitting: Orthopedic Surgery

## 2024-02-25 VITALS — BP 145/117 | HR 95 | Ht 71.0 in | Wt 191.0 lb

## 2024-02-25 DIAGNOSIS — M25511 Pain in right shoulder: Secondary | ICD-10-CM

## 2024-02-25 DIAGNOSIS — M7541 Impingement syndrome of right shoulder: Secondary | ICD-10-CM

## 2024-02-25 MED ORDER — METHYLPREDNISOLONE ACETATE 40 MG/ML IJ SUSP
40.0000 mg | Freq: Once | INTRAMUSCULAR | Status: AC
  Administered 2024-02-25: 40 mg via INTRA_ARTICULAR

## 2024-02-25 NOTE — Patient Instructions (Signed)
 CONTINUE IBUPROFEN  OR ADVIL    YOU CAN ADD TYLENOL  500 MG EVERY 6 HRS AS NEEDED   TOPICAL MEDICATIONS LIKE ICY HOT ARE OL AS WELL   You have received an injection of steroids into the joint. 15% of patients will have increased pain within the 24 hours postinjection.   This is transient and will go away.   We recommend that you use ice packs on the injection site for 20 minutes every 2 hours and extra strength Tylenol  2 tablets every 8 as needed until the pain resolves.  If you continue to have pain after taking the Tylenol  and using the ice please call the office for further instructions.

## 2024-02-25 NOTE — Addendum Note (Signed)
 Addended byArla Lab on: 02/25/2024 11:37 AM   Modules accepted: Orders
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
## 2024-02-25 NOTE — Progress Notes (Signed)
  Intake history:  BP (!) 145/117   Pulse 95   Ht 5\' 11"  (1.803 m)   Wt 191 lb (86.6 kg)   BMI 26.64 kg/m  Body mass index is 26.64 kg/m.    WHAT ARE WE SEEING YOU FOR TODAY?   right shoulder  How long has this bothered you? (DOI?DOS?WS?)  approximately 3 week(s) ago no injury woke up with pain   Anticoag.  No  Diabetes Yes  Heart disease No  Hypertension Yes  SMOKING HX Yes  Kidney disease No  Any ALLERGIES ________________ Allergies  Allergen Reactions   Asa [Aspirin ] Nausea Only   ______________________________   Treatment:  Have you taken:  Tylenol  No  Advil  Yes  Had PT No  Had injection No  Other  _________________icy hot rubs ________

## 2024-03-05 NOTE — Progress Notes (Signed)
 GI Office Note    Referring Provider: Meldon Sport, MD Primary Care Physician:  David Sport, MD Primary Gastroenterologist: David Knox. David April, DO  Date:  03/10/2024  ID:  David Knox, DOB 1965/11/23, MRN [id number]   Chief Complaint   Chief Complaint  Patient presents with   Rectal Pain    Anal pain X 2 weeks. There is some burning with BM and after.   History of Present Illness  David Knox is a 58 y.o. Knox with a history of diabetes, HTN, HLD, GERD, prior cocaine abuse, cholecystitis s/p cholecystectomy Sep 2024 presenting today with abdominal pain with bowel movements and rectal pain.  OV 06/29/2022 to discuss scheduling first-ever screening colonoscopy.  He denied any melena, BRBPR, unintentional weight loss, family history of colon cancer.  Did report postprandial bowel movements stating is a chronic issue.  Patient has a bowel movement 30 minutes after eating.  Mostly formed stools, sometimes runny.  Dairy products can cause looser stools.  Sometimes having 5-6 BMs daily without nocturnal stools.  Denied abdominal pain.  GERD controlled with omeprazole 20 mg over-the-counter daily.  Patient reported not being bothered by stools, lactose intolerance suspected.  Advised screening for celiac disease with serologies.  Prior thyroid levels within normal limits. Scheduled for colonoscopy (urine drug screen advised).  Advised Lactaid prior to dairy consumption and advised to stop omeprazole start pantoprazole  in case omeprazole causes frequent diarrhea.   IgA elevated, however TTG IgA negative.   Colonoscopy 07/27/2022: -Transverse and descending diverticulosis -4 mm polyp in descending colon -Diminutive hyperplastic polyp, negative for dysplasia -Repeat colonoscopy in 10 years   OV 10/26/22. Having loose BM after every meal. Occurring sooner after meals. Still has gallbladder and reports this is same issues as his wife. Tries to avoid dairy products. Stools are bristol  5-7. Started metformin  in July /August 2023 after hip surgery. Originally stated symptoms began in July/August of 2023. GERD controlled. Advised imodium  as needed. Advised to discuss stopping metformin  with PCP. Continue pantoprazole  daily. Advised potential further workup with fecal elastase and possible abdominal imaging if imodium  and stopping metformin  not helpful. FECAL ELASTASE NEVER PERFORMED.    EGD 01/18/23: -3 cm hiatal hernia -Grade a reflux esophagitis without bleeding s/p biopsy -Gastritis s/p biopsy -Duodenitis with history of biopsy -Pathology -duodenal biopsies with foveolar metaplasia suggesting peptic injury gastric biopsies with reactive gastropathy, negative for H. pylori, GE junction with mild reflux changes - Advised PPI BID   PCP recommended decreasing his metformin  from 1000 mg twice daily to 500 mg daily given his reports of diarrhea.  He was advised to continue to take Imodium  as needed.  Also recommended over-the-counter probiotic.   OV 06/13/2023.  Patient reported he was taking Imodium  once daily and having 4-5 bowel movements per day, usually in small amounts.  Usually occurs within 10-15 minutes of meals.  Stools described as [place] 6-7.  Denied any eating dairy products, reports pieces of lettuce in his stool.  Denied any nausea or vomiting.  GERD pretty well-controlled without dysphagia.  Again advised to check fecal elastase, continue Imodium  1-2 times a day and could schedule first dose every morning.  Continue PPI twice daily.  Advised we could consider trial of pancreatic enzymes if elastase is low versus cholestyramine .  Encouraged to continue to avoid lactose.   Hospitalization at [facility] 9/15 - 9/18.  He presented with 5 days of RUQ pain as well as chest pain and vomiting.  He was diagnosed with  acute cholecystitis and underwent cholecystectomy with Dr. Collene Knox with need for JP drain placement given gallbladder was thickened and distended and had purulent drainage.  He  was noted to have significant impaction of stones and inflammation within the gallbladder.  He was treated with Augmentin .   He was seen by Dr. Collene Knox on 9/26 for follow-up post cholecystectomy.  Patient reportedly feeling well and had minimal output from JP drain therefore it was removed and dressing placed.   OV 08/29/23.  Still having bowel movements about 30 minutes after meals, usually depending on what he eats.  Usually does occur with fatty or greasy meals.  Reflux doing good as long as he takes medication.  Denies any significant breakthrough symptoms.  Reported good appetite.  Does have some shortness of breath and chest tightness with exertion, going to see cardiology the following day.  Recommended Imodium  if going out, following a gallbladder eating plan and continuing pantoprazole  40 mg twice daily.   OV 12/06/2023.  Having lots of diarrhea, having to take Pepto at times and then would have black stool afterwards.  Stools have been more watery.  Having frequent bowel movements about 5-10 minutes post meals.  Reported some mild weight gain, no weight loss.  Denied nausea, lack of appetite, early satiety, vomiting, or abdominal pain.  Does report some increased abdominal distention at times.  GERD well-controlled with pantoprazole .  Advised to check fecal elastase and do a trial of cholestyramine  4 g once daily and increase as needed.  Advised Imodium  for severe refractory diarrhea.  Continue PPI twice daily and follow low-fat diet.  Follow-up 6 weeks.   Fecal elastase 273.  Low end of normal.  Advised continue cholestyramine .  Last office visit 01/23/24.  Taking half dose Imodium  prior to going out to eat that way he can ensure he makes it home before needing to have a bowel movement.  Stools have not been hard and have firmed up much more since last visit.  Denies any abdominal pain.  Having some mild improvement in urgency since last visit.  He had been taking cholestyramine  4 g every other  day. Advised consideration of zenpep in the future if having issues still on cholestyramine    Today:  Feels like needles sticking him in his bottom and otc hemorrhoid cream (potentially cortisone cream) and that has helped some.  Denies knifelike pain hasn't seen any blood in his stool. Having to walk with legs open and hard to it down. Pain started after BM. Kinda felt like tissue was hanging out at times, tissue likely going on by itself.  Also having some pressure as well as some itching.  Denies abdominal pain. Still taking the cholestyramine  every other day. Stools not running and stools not hard and not going as often. Still going 1-2 times daily.  Currently happy with bowel habits.  No upper GI symptoms present at this time.  Reflux well-controlled.   Wt Readings from Last 3 Encounters:  03/10/24 192 lb 12.8 oz (87.5 kg)  02/25/24 191 lb (86.6 kg)  02/18/24 193 lb (87.5 kg)    Current Outpatient Medications  Medication Sig Dispense Refill   albuterol  (VENTOLIN  HFA) 108 (90 Base) MCG/ACT inhaler Inhale 2 puffs into the lungs every 6 (six) hours as needed for wheezing or shortness of breath. 8 g 3   atorvastatin  (LIPITOR) 20 MG tablet TAKE 1 TABLET BY MOUTH EVERY DAY 90 tablet 1   blood glucose meter kit and supplies Dispense based on patient and  insurance preference. Use up to four times daily as directed. (FOR ICD-10 E10.9, E11.9). 1 each 0   cholestyramine  (QUESTRAN ) 4 GM/DOSE powder Take 1 packet (4 g total) by mouth daily. 90 g 1   cyclobenzaprine  (FLEXERIL ) 5 MG tablet Take 1 tablet (5 mg total) by mouth 2 (two) times daily as needed. 30 tablet 1   DULoxetine  (CYMBALTA ) 30 MG capsule TAKE 1 CAPSULE BY MOUTH EVERY DAY 90 capsule 1   gabapentin  (NEURONTIN ) 400 MG capsule Take 1 capsule (400 mg total) by mouth 2 (two) times daily. 60 capsule 5   glipiZIDE  (GLUCOTROL ) 5 MG tablet Take 1 tablet (5 mg total) by mouth 2 (two) times daily before a meal. 60 tablet 3   ibuprofen  (ADVIL )  200 MG tablet Take 400 mg by mouth 2 (two) times daily as needed for headache or moderate pain.     latanoprost (XALATAN) 0.005 % ophthalmic solution Place 1 drop into both eyes at bedtime.     metoprolol  tartrate (LOPRESSOR ) 25 MG tablet TAKE 1 TABLET BY MOUTH (2) TIMES A DAY. 180 tablet 2   Olmesartan -amLODIPine -HCTZ 20-5-12.5 MG TABS Take 1 tablet by mouth daily. 30 tablet 3   pantoprazole  (PROTONIX ) 40 MG tablet TAKE 1 TABLET BY MOUTH TWICE A DAY 60 tablet 11   tamsulosin  (FLOMAX ) 0.4 MG CAPS capsule TAKE 1 CAPSULE BY MOUTH EVERY DAY 90 capsule 1   nitroGLYCERIN  (NITROSTAT ) 0.4 MG SL tablet Place 1 tablet (0.4 mg total) under the tongue every 5 (five) minutes as needed for chest pain. 10 tablet 1   No current facility-administered medications for this visit.    Past Medical History:  Diagnosis Date   Acid reflux    Diabetes mellitus without complication (HCC)    HLD (hyperlipidemia)    HTN (hypertension)     Past Surgical History:  Procedure Laterality Date   BIOPSY  01/18/2023   Procedure: BIOPSY;  Surgeon: Vinetta Greening, DO;  [facility];  Service: Endoscopy;;   CHOLECYSTECTOMY N/A 07/23/2023   Procedure: LAPAROSCOPIC CHOLECYSTECTOMY;  Surgeon: Awilda Bogus, MD;  Location: [facility];  Service: General;  Laterality: N/A;   COLONOSCOPY WITH PROPOFOL  N/A 07/27/2022   Procedure: COLONOSCOPY WITH PROPOFOL ;  Surgeon: Vinetta Greening, DO;  Location: [facility];  Service: Endoscopy;  Laterality: N/A;  9:00 am  ASA 2   ESOPHAGOGASTRODUODENOSCOPY  2002   RMR: ulcerative reflux esophagitis, moderate sized hiatal hernia   ESOPHAGOGASTRODUODENOSCOPY (EGD) WITH PROPOFOL  N/A 01/18/2023   Procedure: ESOPHAGOGASTRODUODENOSCOPY (EGD) WITH PROPOFOL ;  Surgeon: Vinetta Greening, DO;  [facility]: [facility];  Service: Endoscopy;  Laterality: N/A;  1:45 pm, asa 2   HIP SURGERY Bilateral    as a child   POLYPECTOMY  07/27/2022   Procedure: POLYPECTOMY;  Surgeon: Vinetta Greening, DO;  Location: [facility];  Service: Endoscopy;;   TOOTH EXTRACTION     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   Procedure: TOTAL HIP ARTHROPLASTY;  Surgeon: Darrin Emerald, MD;  Location: [facility];  Service: Orthopedics;  Laterality: Right;   WRIST SURGERY      Family History  Problem Relation Age of Onset   Diabetes Mother    Heart disease Mother    Diabetes Father    Heart disease Father    Colon cancer Neg Hx     Allergies as of 03/10/2024 - Review Complete 03/10/2024  Allergen Reaction Noted   Mariea Shook ] Nausea Only 05/18/2022    Social History  Socioeconomic History   Marital status: Single    Spouse name: Not on file   Number of children: Not on file   Years of education: Not on file   Highest education level: Not on file  Occupational History   Not on file  Tobacco Use   Smoking status: Every Day    Current packs/day: 0.50    Types: Cigarettes   Smokeless tobacco: Never   Tobacco comments:    5 ciggs per day - 05/07/2023  Vaping Use   Vaping status: Never Used  Substance and Sexual Activity   Alcohol use: Yes    Comment: beer and liquor twice weekly   Drug use: No    Comment: "crack" former- last about 3 years ago.   Sexual activity: Not on file  Other Topics Concern   Not on file  Social History Narrative   Not on file   Social Drivers of Health   Financial Resource Strain: Not on file  Food Insecurity: No Food Insecurity (07/22/2023)   Hunger Vital Sign    Worried About Running Out of Food in the Last Year: Never true    Ran Out of Food in the Last Year: Never true  Transportation Needs: No Transportation Needs (07/22/2023)   PRAPARE - Administrator, Civil Service (Medical): No    Lack of Transportation (Non-Medical): No  Physical Activity: Not on file  Stress: Not on file  Social Connections: Not on file   Review of Systems   Gen: Denies fever, chills, anorexia. Denies fatigue, weakness, weight loss.  CV: Denies  chest pain, palpitations, syncope, peripheral edema, and claudication. Resp: Denies dyspnea at rest, cough, wheezing, coughing up blood, and pleurisy. GI: See HPI Derm: Denies rash, itching, dry skin Psych: Denies depression, anxiety, memory loss, confusion. No homicidal or suicidal ideation.  Heme: Denies bruising, bleeding, and enlarged lymph nodes.  Physical Exam   BP 101/70   Pulse 96   Temp 98.2 F (36.8 C)   Ht 5\' 11"  (1.803 m)   Wt 192 lb 12.8 oz (87.5 kg)   BMI 26.89 kg/m   General:   Alert and oriented. No distress noted. Pleasant and cooperative.  Head:  Normocephalic and atraumatic. Eyes:  Conjuctiva clear without scleral icterus. Mouth:  Oral mucosa pink and moist. Good dentition. No lesions. Rectal: deferred Msk:  Symmetrical without gross deformities. Normal posture. Extremities:  Without edema. Neurologic:  Alert and  oriented x4 Psych:  Alert and cooperative. Normal mood and affect.  Assessment  MERT SUGIMOTO is a 58 y.o. Knox with a history of diabetes, HTN, HLD, GERD, prior cocaine abuse, cholecystitis s/p cholecystectomy Sep 2024 presenting today with abdominal pain with bowel movements and rectal pain.   Chronic diarrhea, fecal urgency, abdominal pain: -Suspect diarrhea secondary to bile acid absorption -Symptoms occurred even prior to cholecystitis with cholecystectomy however have continued since -Fecal elastase low normal, potential EPI component. -Advised that we can still consider trial of pancreatic enzymes in the future. -Symptoms have been fairly well-controlled with cholestyramine  4 g every other day and Imodium  prior to going out - Currently happy with bowel regimen  Rectal pain:  - For the last 2 weeks has been having rectal pain feeling like pins-and-needles with difficulty with sitting and walking - Denies any rectal bleeding or abdominal pain - Occasionally has felt some protruding tissue potentially - Denies knifelike pain, anal  fissure still possible but suspect primarily hemorrhoids as etiology of pain - OTC hydrocortisone  1% has been somewhat helpful although no complete relief of symptoms - Will trial over-the-counter Preparation H with lidocaine  for 2 weeks to help with vasoconstriction as well as pain - If no improvement after 2 weeks we will treat for possible anal fissure - Discussed that if this is hemorrhoids, banding is an option.  Provided a pamphlet and discussed procedure today.  GERD: Well-controlled with pantoprazole  40 mg twice daily.  Denies nausea, vomiting, or dysphagia.  PLAN   Otc preparation H (phenylephrine ) + lidocaine  BID for 1-2 weeks.  May consider empiric treatment for anal fissure with compound cream if no improvement in 2 weeks.  Continue half Imodium  as needed prior to going out. Cholestyramine  4g every other day.  Continue pantoprazole  40 mg twice daily Low fat diet Discussed hemorrhoid banding, pamphlet provided.  Can consider in the future if ongoing symptoms. Follow up in 6 weeks   Julian Obey, MSN, FNP-BC, AGACNP-BC [facility]

## 2024-03-10 ENCOUNTER — Ambulatory Visit (INDEPENDENT_AMBULATORY_CARE_PROVIDER_SITE_OTHER): Admitting: Gastroenterology

## 2024-03-10 ENCOUNTER — Encounter: Payer: Self-pay | Admitting: Gastroenterology

## 2024-03-10 VITALS — BP 101/70 | HR 96 | Temp 98.2°F | Ht 71.0 in | Wt 192.8 lb

## 2024-03-10 DIAGNOSIS — K529 Noninfective gastroenteritis and colitis, unspecified: Secondary | ICD-10-CM | POA: Diagnosis not present

## 2024-03-10 DIAGNOSIS — R152 Fecal urgency: Secondary | ICD-10-CM

## 2024-03-10 DIAGNOSIS — K641 Second degree hemorrhoids: Secondary | ICD-10-CM

## 2024-03-10 DIAGNOSIS — K219 Gastro-esophageal reflux disease without esophagitis: Secondary | ICD-10-CM | POA: Diagnosis not present

## 2024-03-10 DIAGNOSIS — K6289 Other specified diseases of anus and rectum: Secondary | ICD-10-CM | POA: Diagnosis not present

## 2024-03-10 DIAGNOSIS — R109 Unspecified abdominal pain: Secondary | ICD-10-CM

## 2024-03-10 NOTE — Patient Instructions (Signed)
 Please get over-the-counter hemorrhoid cream from your local pharmacy.  You do not have to get namebrand but if you would like to get Preparation H brand you can.  I mainly want to make sure that the 2 ingredients that you treat your hemorrhoids with are phenylephrine  and lidocaine .  The phenylephrine  is to help shrink your hemorrhoids and the lidocaine  is to help with pain relief.  If it also has some hydrocortisone in it that is also okay as that will help with itching.    You should apply a pea-sized or dime size amount at least twice daily.  If you do not get a combination cream then you should get a phenylephrine  cream/ointment and rectal lidocaine  and apply separately.  If you get separate tubes then you can apply the lidocaine  3-4 times a day if needed.  Continue using your pantoprazole  40 mg twice daily as well as your cholestyramine  4 g every other day.  If you need Imodium  prior to going out you can continue this.  It is just very important that your stools remain soft and not hard while you are having issues with hemorrhoids.  It is important to limit toilet time.  Spending more than 5 minutes on the commode at the time causes issues with gravity and hemorrhoids and can worsen them.  Please look over the banding pamphlet, if you decide that you would like to proceed with this at your follow-up in 6 weeks then please call the office and let me know.  It was a pleasure to see you today. I want to create trusting relationships with patients. If you receive a survey regarding your visit,  I greatly appreciate you taking time to fill this out on paper or through your MyChart. I value your feedback.  Julian Obey, MSN, FNP-BC, AGACNP-BC [facility]

## 2024-03-20 ENCOUNTER — Other Ambulatory Visit: Payer: Self-pay | Admitting: Internal Medicine

## 2024-03-20 DIAGNOSIS — G629 Polyneuropathy, unspecified: Secondary | ICD-10-CM

## 2024-04-15 ENCOUNTER — Telehealth: Payer: Self-pay | Admitting: Internal Medicine

## 2024-04-15 NOTE — Telephone Encounter (Signed)
 Called patient left voicemail. Needs an appointment for diabetic eye exam

## 2024-04-21 ENCOUNTER — Other Ambulatory Visit: Payer: Self-pay | Admitting: Internal Medicine

## 2024-04-21 ENCOUNTER — Ambulatory Visit: Admitting: Gastroenterology

## 2024-04-21 DIAGNOSIS — I10 Essential (primary) hypertension: Secondary | ICD-10-CM

## 2024-04-24 ENCOUNTER — Other Ambulatory Visit: Payer: Self-pay | Admitting: Internal Medicine

## 2024-04-24 DIAGNOSIS — I10 Essential (primary) hypertension: Secondary | ICD-10-CM

## 2024-04-24 NOTE — Telephone Encounter (Signed)
 Patient is calling to check on the status of medication refill. Medication was denied. Please advise

## 2024-04-28 ENCOUNTER — Telehealth: Payer: Self-pay | Admitting: Internal Medicine

## 2024-04-28 NOTE — Telephone Encounter (Signed)
 Patient called for clarification of medications, specifically antihypertensives.  He would like a phone call to discuss specific medications that he should take daily.

## 2024-04-28 NOTE — Telephone Encounter (Unsigned)
 Copied from CRM [id number]. Topic: Clinical - Medication Question >> Apr 28, 2024  3:10 PM Donee H wrote: Reason for CRM: Patient calling regarding medication. Patient is stating he thinks the wrong medication was refilled. He was requesting refill for blood pressure medicine. Patient states he doesn't remember name of medicine because he threw away empty bottle but it was a small pink pill. Medicine that was refilled was Olmesartan -amLODIPine -HCTZ 20-5-12.5 MG TABS. Patient wants to know if this medication is for his blood pressure and in place of other medicine and if not can the correct medicine be filled. He also would like to know exactly what medications should he be taken. Patient states over time some have been added and taken off and he really not sure exactly what he supposed to be taking. Callback [phone]

## 2024-04-30 NOTE — Telephone Encounter (Signed)
 Patient advised.

## 2024-05-04 ENCOUNTER — Other Ambulatory Visit: Payer: Self-pay | Admitting: Internal Medicine

## 2024-05-04 DIAGNOSIS — N401 Enlarged prostate with lower urinary tract symptoms: Secondary | ICD-10-CM

## 2024-05-11 NOTE — Progress Notes (Unsigned)
 GI Office Note    Referring Provider: Tobie Suzzane POUR, MD Primary Care Physician:  Tobie Suzzane POUR, MD Primary Gastroenterologist: Carlin POUR. Cindie, DO  Date:  05/12/2024  ID:  David Knox, DOB [birth year], MRN [id number]  Chief Complaint   Chief Complaint  Patient presents with   Follow-up    Pt arrives for follow up. Pt states about 1.5 weeks ago he had a flare up of his hemorrhoids. Pt is not sure why because he has not changed anything. Pt states the flare ups happen about once per month. Pt does need refill on Questran . Pt states if he eats eggs, they stay down about 10 minutes and then he has to go to bathroom. Pt states he can sometimes see food in bowel movements (for example if he eats a salad he can see the lettuce). No blood in stool, not dark in color.    History of Present Illness  David Knox is a 58 y.o. male with a history of diabetes, HTN, HLD, GERD, prior cocaine abuse, cholecystitis s/p cholecystectomy Sep 2024  presenting today with complaint of hemorrhoid issues and diarrhea.  OV 06/29/2022 to discuss scheduling first-ever screening colonoscopy.  He denied any melena, BRBPR, unintentional weight loss, family history of colon cancer.  Did report postprandial bowel movements stating is a chronic issue.  Patient has a bowel movement 30 minutes after eating.  Mostly formed stools, sometimes runny.  Dairy products can cause looser stools.  Sometimes having 5-6 BMs daily without nocturnal stools.  Denied abdominal pain.  GERD controlled with omeprazole 20 mg over-the-counter daily.  Patient reported not being bothered by stools, lactose intolerance suspected.  Advised screening for celiac disease with serologies.  Prior thyroid levels within normal limits. Scheduled for colonoscopy (urine drug screen advised).  Advised Lactaid prior to dairy consumption and advised to stop omeprazole start pantoprazole  in case omeprazole causes frequent diarrhea.   IgA elevated, however  TTG IgA negative.   Colonoscopy 07/27/2022: -Transverse and descending diverticulosis -4 mm polyp in descending colon -Diminutive hyperplastic polyp, negative for dysplasia -Repeat colonoscopy in 10 years   OV 10/26/22. Having loose BM after every meal. Occurring sooner after meals. Still has gallbladder and reports this is same issues as his wife. Tries to avoid dairy products. Stools are bristol 5-7. Started metformin  in July /August 2023 after hip surgery. Originally stated symptoms began in July/August of 2023. GERD controlled. Advised imodium  as needed. Advised to discuss stopping metformin  with PCP. Continue pantoprazole  daily. Advised potential further workup with fecal elastase and possible abdominal imaging if imodium  and stopping metformin  not helpful. FECAL ELASTASE NEVER PERFORMED.    EGD 01/18/23: -3 cm hiatal hernia -Grade a reflux esophagitis without bleeding s/p biopsy -Gastritis s/p biopsy -Duodenitis with history of biopsy -Pathology -duodenal biopsies with foveolar metaplasia suggesting peptic injury gastric biopsies with reactive gastropathy, negative for H. pylori, GE junction with mild reflux changes - Advised PPI BID   PCP recommended decreasing his metformin  from 1000 mg twice daily to 500 mg daily given his reports of diarrhea.  He was advised to continue to take Imodium  as needed.  Also recommended over-the-counter probiotic.   OV 06/13/2023.  Patient reported he was taking Imodium  once daily and having 4-5 bowel movements per day, usually in small amounts.  Usually occurs within 10-15 minutes of meals.  Stools described as [place] 6-7.  Denied any eating dairy products, reports pieces of lettuce in his stool.  Denied any nausea or vomiting.  GERD pretty well-controlled without dysphagia.  Again advised to check fecal elastase, continue Imodium  1-2 times a day and could schedule first dose every morning.  Continue PPI twice daily.  Advised we could consider trial of  pancreatic enzymes if elastase is low versus cholestyramine .  Encouraged to continue to avoid lactose.   Hospitalization at [facility] 9/15 - 9/18.  He presented with 5 days of RUQ pain as well as chest pain and vomiting.  He was diagnosed with acute cholecystitis and underwent cholecystectomy with Dr. Kallie with need for JP drain placement given gallbladder was thickened and distended and had purulent drainage.  He was noted to have significant impaction of stones and inflammation within the gallbladder.  He was treated with Augmentin .   He was seen by Dr. Kallie on 9/26 for follow-up post cholecystectomy.  Patient reportedly feeling well and had minimal output from JP drain therefore it was removed and dressing placed.   OV 08/29/23.  Still having bowel movements about 30 minutes after meals, usually depending on what he eats.  Usually does occur with fatty or greasy meals.  Reflux doing good as long as he takes medication.  Denies any significant breakthrough symptoms.  Reported good appetite.  Does have some shortness of breath and chest tightness with exertion, going to see cardiology the following day.  Recommended Imodium  if going out, following a gallbladder eating plan and continuing pantoprazole  40 mg twice daily.   OV 12/06/2023.  Having lots of diarrhea, having to take Pepto at times and then would have black stool afterwards.  Stools have been more watery.  Having frequent bowel movements about 5-10 minutes post meals.  Reported some mild weight gain, no weight loss.  Denied nausea, lack of appetite, early satiety, vomiting, or abdominal pain.  Does report some increased abdominal distention at times.  GERD well-controlled with pantoprazole .  Advised to check fecal elastase and do a trial of cholestyramine  4 g once daily and increase as needed.  Advised Imodium  for severe refractory diarrhea.  Continue PPI twice daily and follow low-fat diet.  Follow-up 6 weeks.   Fecal elastase 273.  Low end of  normal.  Advised continue cholestyramine .   OV 01/23/24.  Taking half dose Imodium  prior to going out to eat that way he can ensure he makes it home before needing to have a bowel movement.  Stools have not been hard and have firmed up much more since last visit.  Denies any abdominal pain.  Having some mild improvement in urgency since last visit.  He had been taking cholestyramine  4 g every other day. Advised consideration of zenpep in the future if having issues still on cholestyramine .  Last office visit 03/10/24. Aving rectal pain and having some relief with otc hemorrhoid cream. Denied knifelike pain. Having to walk off balance and painful to sit. Associated with some itching. No abdominal pain and continued to take questran  every other day, 1-2 BM daily. Advised preparation h with lidocaine  and if no improvement will treat empirically for fissure. Discussed hemorrhoid banding as treatment option.    Today:  Fecal urgency/diarrhea -having urgency about 15-20 minutes after eating eggs.  He eats 1 he is usually okay but if he eats more than that then he has to be near a restroom therefore he does not usually go out if he eats less.  Typically with the Questran  4 g every other day he is having more than 3 mushy bowel movements early in the day if he has  another bowel movement is usually fairly hard.  At times not much is coming out with these bowel movements.  Hemorrhoids - no melena or brbpr. Felt like his insides were coming out even with sneezing. With even getting up he was having pain. He used cream and went away for about 3 hours  and no trouble since then. Once a month this happens and he can't figure out why that's occurring. Can sometimes feel tissues with wiping and sometimes not. Does have some itching with his hemorrhoids.   Wt Readings from Last 5 Encounters:  05/12/24 196 lb 4.8 oz (89 kg)  03/10/24 192 lb 12.8 oz (87.5 kg)  02/25/24 191 lb (86.6 kg)  02/18/24 193 lb (87.5 kg)   01/23/24 194 lb 12.8 oz (88.4 kg)    Current Outpatient Medications  Medication Sig Dispense Refill   albuterol  (VENTOLIN  HFA) 108 (90 Base) MCG/ACT inhaler Inhale 2 puffs into the lungs every 6 (six) hours as needed for wheezing or shortness of breath. 8 g 3   atorvastatin  (LIPITOR) 20 MG tablet TAKE 1 TABLET BY MOUTH EVERY DAY 90 tablet 1   blood glucose meter kit and supplies Dispense based on patient and insurance preference. Use up to four times daily as directed. (FOR ICD-10 E10.9, E11.9). 1 each 0   cholestyramine  (QUESTRAN ) 4 GM/DOSE powder Take 1 packet (4 g total) by mouth daily. 90 g 1   cyclobenzaprine  (FLEXERIL ) 5 MG tablet Take 1 tablet (5 mg total) by mouth 2 (two) times daily as needed. 30 tablet 1   DULoxetine  (CYMBALTA ) 30 MG capsule TAKE 1 CAPSULE BY MOUTH EVERY DAY 90 capsule 1   gabapentin  (NEURONTIN ) 400 MG capsule TAKE 1 CAPSULE BY MOUTH 2 TIMES DAILY. 60 capsule 5   glipiZIDE  (GLUCOTROL ) 5 MG tablet Take 1 tablet (5 mg total) by mouth 2 (two) times daily before a meal. 60 tablet 3   ibuprofen  (ADVIL ) 200 MG tablet Take 400 mg by mouth 2 (two) times daily as needed for headache or moderate pain.     latanoprost (XALATAN) 0.005 % ophthalmic solution Place 1 drop into both eyes at bedtime.     metoprolol  tartrate (LOPRESSOR ) 25 MG tablet TAKE 1 TABLET BY MOUTH (2) TIMES A DAY. 180 tablet 2   nitroGLYCERIN  (NITROSTAT ) 0.4 MG SL tablet Place 1 tablet (0.4 mg total) under the tongue every 5 (five) minutes as needed for chest pain. 10 tablet 1   Olmesartan -amLODIPine -HCTZ 20-5-12.5 MG TABS Take 1 tablet by mouth daily. 30 tablet 3   pantoprazole  (PROTONIX ) 40 MG tablet TAKE 1 TABLET BY MOUTH TWICE A DAY 60 tablet 11   tamsulosin  (FLOMAX ) 0.4 MG CAPS capsule TAKE 1 CAPSULE BY MOUTH EVERY DAY 90 capsule 1   No current facility-administered medications for this visit.    Past Medical History:  Diagnosis Date   Acid reflux    Diabetes mellitus without complication (HCC)     HLD (hyperlipidemia)    HTN (hypertension)     Past Surgical History:  Procedure Laterality Date   BIOPSY  01/18/2023   Procedure: BIOPSY;  Surgeon: Cindie Carlin POUR, DO;  [facility]: [facility];  Service: Endoscopy;;   CHOLECYSTECTOMY N/A 07/23/2023   Procedure: LAPAROSCOPIC CHOLECYSTECTOMY;  Surgeon: Kallie Manuelita BROCKS, MD;  Location: [facility];  Service: General;  Laterality: N/A;   COLONOSCOPY WITH PROPOFOL  N/A 07/27/2022   Procedure: COLONOSCOPY WITH PROPOFOL ;  Surgeon: Cindie Carlin POUR, DO;  Location: [facility];  Service: Endoscopy;  Laterality: N/A;  9:00 am  ASA 2   ESOPHAGOGASTRODUODENOSCOPY  2002   RMR: ulcerative reflux esophagitis, moderate sized hiatal hernia   ESOPHAGOGASTRODUODENOSCOPY (EGD) WITH PROPOFOL  N/A 01/18/2023   Procedure: ESOPHAGOGASTRODUODENOSCOPY (EGD) WITH PROPOFOL ;  Surgeon: Cindie Carlin POUR, DO;  [facility];  Service: Endoscopy;  Laterality: N/A;  1:45 pm, asa 2   HIP SURGERY Bilateral    as a child   POLYPECTOMY  07/27/2022   Procedure: POLYPECTOMY;  Surgeon: Cindie Carlin POUR, DO;  Location: [facility];  Service: Endoscopy;;   TOOTH EXTRACTION     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   Procedure: TOTAL HIP ARTHROPLASTY;  Surgeon: Margrette Taft BRAVO, MD;  Location: [facility];  Service: Orthopedics;  Laterality: Right;   WRIST SURGERY      Family History  Problem Relation Age of Onset   Diabetes Mother    Heart disease Mother    Diabetes Father    Heart disease Father    Colon cancer Neg Hx     Allergies as of 05/12/2024 - Review Complete 05/12/2024  Allergen Reaction Noted   Dorethia Patterson ] Nausea Only 05/18/2022    Social History   Socioeconomic History   Marital status: Single    Spouse name: Not on file   Number of children: Not on file   Years of education: Not on file   Highest education level: Not on file  Occupational History   Not on file  Tobacco Use   Smoking status: Every Day    Current packs/day: 0.50     Types: Cigarettes   Smokeless tobacco: Never   Tobacco comments:    5 ciggs per day - 05/07/2023  Vaping Use   Vaping status: Never Used  Substance and Sexual Activity   Alcohol use: Yes    Comment: beer and liquor twice weekly   Drug use: No    Comment: crack former- last about 3 years ago.   Sexual activity: Not on file  Other Topics Concern   Not on file  Social History Narrative   Not on file   Social Drivers of Health   Financial Resource Strain: Not on file  Food Insecurity: No Food Insecurity (07/22/2023)   Hunger Vital Sign    Worried About Running Out of Food in the Last Year: Never true    Ran Out of Food in the Last Year: Never true  Transportation Needs: No Transportation Needs (07/22/2023)   PRAPARE - Administrator, Civil Service (Medical): No    Lack of Transportation (Non-Medical): No  Physical Activity: Not on file  Stress: Not on file  Social Connections: Not on file   Review of Systems   Gen: Denies fever, chills, anorexia. Denies fatigue, weakness, weight loss.  CV: Denies chest pain, palpitations, syncope, peripheral edema, and claudication. Resp: Denies dyspnea at rest, cough, wheezing, coughing up blood, and pleurisy. GI: See HPI Derm: Denies rash, itching, dry skin Psych: Denies depression, anxiety, memory loss, confusion. No homicidal or suicidal ideation.  Heme: Denies bruising, bleeding, and enlarged lymph nodes.  Physical Exam   BP 118/68   Pulse 84   Temp 97.9 F (36.6 C)   Ht 5' 11 (1.803 m)   Wt 196 lb 4.8 oz (89 kg)   BMI 27.38 kg/m   General:   Alert and oriented. No distress noted. Pleasant and cooperative.  Head:  Normocephalic and atraumatic. Eyes:  Conjuctiva clear without scleral icterus. Mouth:  Oral mucosa pink and moist. Good dentition.  No lesions. Rectal: deferred Msk:  Symmetrical without gross deformities. Normal posture. Extremities:  Without edema. Neurologic:  Alert and  oriented x4 Psych:  Alert and  cooperative. Normal mood and affect.  Assessment  David Knox is a 58 y.o. male presenting today with hemorrhoids and concerns about urgency.   Rectal pain, hemorrhoids: Having a flare for about a day about once a month.  Most recent flare was significantly painful without bleeding.  At times will also have some itching.  Likely having grade 2 hemorrhoids given at times tissue was there when wiping and sometimes not.  Over-the-counter hemorrhoid cream does work for him to provide some relief.  We discussed banding again in detail today, advised against surgical removal given prolonged healing time and pain.  He would like to consider MASLD, will provide pamphlet for him today.  Chronic bile acid diarrhea, urgency:  - Likely secondary to bile malabsorption however fecal elastase on lower normal end of previous check -Symptoms were occurring prior to cholecystitis however were slightly worse since cholecystectomy -Had been doing fairly well with Questran  4 g every other day and Imodium  prior to going out -Eggs have been a trigger for him, likely due to increased fat and/or cooking oils. -Discussed again possible trial of pancreatic enzymes today if unable to have good regular bowel movements despite titrating of Questran  dosing. - Appears with 4 g every other day he may be experiencing some constipation which is contributing to intermittent urgency and decreased stools at the first part of the day. - We discussed titrating Questran  to 2 g daily or 2 g every other day  PLAN   Cholestyramine  2g daily.  OTC  steroid or Preparation H hemorrhoid cream as needed.  Hemorrhoid banding Discussed ongoing low-fat diet Continue pantoprazole  40 mg BID Advised progress report in a few weeks to let me know how he is doing in regards to bowel function Follow up as previously scheduled.  I reviewed this morning hemorrhoid banding pamphlet.   Charmaine Melia, MSN, FNP-BC, AGACNP-BC [facility]

## 2024-05-12 ENCOUNTER — Encounter: Payer: Self-pay | Admitting: Internal Medicine

## 2024-05-12 ENCOUNTER — Encounter: Payer: Self-pay | Admitting: Gastroenterology

## 2024-05-12 ENCOUNTER — Ambulatory Visit (INDEPENDENT_AMBULATORY_CARE_PROVIDER_SITE_OTHER): Admitting: Gastroenterology

## 2024-05-12 VITALS — BP 118/68 | HR 84 | Temp 97.9°F | Ht 71.0 in | Wt 196.3 lb

## 2024-05-12 DIAGNOSIS — R152 Fecal urgency: Secondary | ICD-10-CM

## 2024-05-12 DIAGNOSIS — Z9049 Acquired absence of other specified parts of digestive tract: Secondary | ICD-10-CM

## 2024-05-12 DIAGNOSIS — R197 Diarrhea, unspecified: Secondary | ICD-10-CM

## 2024-05-12 DIAGNOSIS — K649 Unspecified hemorrhoids: Secondary | ICD-10-CM | POA: Diagnosis not present

## 2024-05-12 DIAGNOSIS — K529 Noninfective gastroenteritis and colitis, unspecified: Secondary | ICD-10-CM

## 2024-05-12 DIAGNOSIS — K6289 Other specified diseases of anus and rectum: Secondary | ICD-10-CM

## 2024-05-12 MED ORDER — CHOLESTYRAMINE 4 GM/DOSE PO POWD
2.0000 g | Freq: Every day | ORAL | 1 refills | Status: AC
Start: 2024-05-12 — End: ?

## 2024-05-12 MED ORDER — CHOLESTYRAMINE 4 GM/DOSE PO POWD: 4.0000 g | Freq: Every day | ORAL | 3 refills | Status: DC

## 2024-05-12 NOTE — Patient Instructions (Addendum)
 Continue to use your hemorrhoid cream as needed. If ithcing I recommend the steroid hemorrhoid cram but if having pain and/or bleeding then I recommend the preparation H.   You can also do sitz bath's with warm water , Tucks pads, or ice packs to the area for relief of pain.  Try to avoid lots of squatting or heavy lifting to avoid recurrent hemorrhoids.  Limit toilet time.  Please look over the hemorrhoid banding pamphlet, if you decide you want to proceed to just call the office to make a banding appointment.  To help with the chronic diarrhea and fluctuations in your bowel habits, lets try reducing your cholestyramine  4 g every other day to 2 g.  You can try this every day if you would like or starting with your current plan of 2 g every other day.  It sounds like the 4 g even though you are having mushy stools 1-2 times daily that the next stools are more hard therefore you are not having good productive bowel movements earlier in the day.  I updated your prescription with [facility] to make your dosing 2 g daily, this is about 1 teaspoon every day.  You can always ask the pharmacy for correct 2 g measuring spoon or eyeball undercurrent scoop to about half.   It was a pleasure to see you today. I want to create trusting relationships with patients. If you receive a survey regarding your visit,  I greatly appreciate you taking time to fill this out on paper or through your MyChart. I value your feedback.  Charmaine Melia, MSN, FNP-BC, AGACNP-BC [facility]

## 2024-05-15 ENCOUNTER — Ambulatory Visit

## 2024-05-15 DIAGNOSIS — E1140 Type 2 diabetes mellitus with diabetic neuropathy, unspecified: Secondary | ICD-10-CM

## 2024-05-15 LAB — HM DIABETES EYE EXAM

## 2024-05-15 NOTE — Progress Notes (Signed)
 Arrived on 05/15/2024 and has given verbal consent to obtain images and complete their overdue diabetic retinal screening.  The images have been sent to an ophthalmologist or optometrist for review and interpretation.  Results will be sent back to [facility] for review.  Patient has been informed they will be contacted when we receive the results via telephone or MyChart.

## 2024-05-23 ENCOUNTER — Ambulatory Visit: Admitting: Internal Medicine

## 2024-05-23 ENCOUNTER — Encounter: Payer: Self-pay | Admitting: Internal Medicine

## 2024-05-23 VITALS — BP 109/70 | HR 81 | Ht 71.0 in | Wt 196.8 lb

## 2024-05-23 DIAGNOSIS — N529 Male erectile dysfunction, unspecified: Secondary | ICD-10-CM

## 2024-05-23 DIAGNOSIS — Z7984 Long term (current) use of oral hypoglycemic drugs: Secondary | ICD-10-CM

## 2024-05-23 DIAGNOSIS — E782 Mixed hyperlipidemia: Secondary | ICD-10-CM

## 2024-05-23 DIAGNOSIS — I10 Essential (primary) hypertension: Secondary | ICD-10-CM

## 2024-05-23 DIAGNOSIS — E1140 Type 2 diabetes mellitus with diabetic neuropathy, unspecified: Secondary | ICD-10-CM

## 2024-05-23 MED ORDER — DULOXETINE HCL 60 MG PO CPEP: 60.0000 mg | ORAL_CAPSULE | Freq: Every day | ORAL | 5 refills | Status: DC

## 2024-05-23 MED ORDER — GLIPIZIDE 10 MG PO TABS: 10.0000 mg | ORAL_TABLET | Freq: Two times a day (BID) | ORAL | 3 refills | Status: DC

## 2024-05-23 MED ORDER — TADALAFIL 10 MG PO TABS: 10.0000 mg | ORAL_TABLET | Freq: Every day | ORAL | 1 refills | Status: DC | PRN

## 2024-05-23 NOTE — Progress Notes (Addendum)
 Established Patient Office Visit  Subjective:  Patient ID: David Knox, male    DOB: Jan 18, 1966  Age: 58 y.o. MRN: [id number]  CC:  Chief Complaint  Patient presents with   Medical Management of Chronic Issues    3 month f/u     HPI David Knox is a 58 y.o. male with past medical history of HTN, type 2 DM, peripheral neuropathy, GERD, hip arthritis and tobacco abuse who presents for f/u of his chronic medical conditions.  HTN: BP is wnl today, and has been wnl at home.  He was given olmesartan -amlodipine -HCTZ 20-5-12.5 mg QD, which is controlling his blood pressure well. He had cardiology evaluation for episodes of chest pain.  He was placed on metoprolol  25 mg BID by cardiologist initially, but was stopped due to erectile dysfunction.  He denies any recent episodes of chest pain.  Patient denies headache, dizziness, or palpitations.  Type II DM: His HbA1c had worsened to 9.7 now from 8.5 in 03/25.  He takes glipizide  5 mg twice daily now.  He did not tolerate metformin .  He admits that he needs to cut down soft drinks intake.  He denies any polyuria or polyphagia currently.  He admits that he needs to improve diet.  He also takes about 4 alcoholic drinks almost every other day.  He still complains of bilateral feet burning pain despite taking gabapentin  400 mg twice daily.  He has not taken Cymbalta  recently.  He has noticed mild improvement with Cymbalta .  He had US  ABI screening, which showed - Normal bilateral resting ankle-brachial indices. He denies any claudication symptoms.  He had urinary frequency and nocturia.  Denies any dysuria or hematuria. Has noticed postvoid dribbling at times, but has felt better with Flomax  now.  His PSA is WNL.  He still reports erectile dysfunction, has difficulty maintaining erection.  Past Medical History:  Diagnosis Date   Acid reflux    Diabetes mellitus without complication (HCC)    HLD (hyperlipidemia)    HTN (hypertension)      Past Surgical History:  Procedure Laterality Date   BIOPSY  01/18/2023   Procedure: BIOPSY;  Surgeon: Cindie Carlin POUR, DO;  Location: [facility];  Service: Endoscopy;;   CHOLECYSTECTOMY N/A 07/23/2023   Procedure: LAPAROSCOPIC CHOLECYSTECTOMY;  Surgeon: Kallie Manuelita BROCKS, MD;  Location: [facility];  Service: General;  Laterality: N/A;   COLONOSCOPY WITH PROPOFOL  N/A 07/27/2022   Procedure: COLONOSCOPY WITH PROPOFOL ;  Surgeon: Cindie Carlin POUR, DO;  Location: [facility];  Service: Endoscopy;  Laterality: N/A;  9:00 am  ASA 2   ESOPHAGOGASTRODUODENOSCOPY  2002   RMR: ulcerative reflux esophagitis, moderate sized hiatal hernia   ESOPHAGOGASTRODUODENOSCOPY (EGD) WITH PROPOFOL  N/A 01/18/2023   Procedure: ESOPHAGOGASTRODUODENOSCOPY (EGD) WITH PROPOFOL ;  Surgeon: Cindie Carlin POUR, DO;  Location: [facility];  Service: Endoscopy;  Laterality: N/A;  1:45 pm, asa 2   HIP SURGERY Bilateral    as a child   POLYPECTOMY  07/27/2022   Procedure: POLYPECTOMY;  Surgeon: Cindie Carlin POUR, DO;  Location: [facility];  Service: Endoscopy;;   TOOTH EXTRACTION     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   Procedure: TOTAL HIP ARTHROPLASTY;  Surgeon: Margrette Taft BRAVO, MD;  Location: [facility];  Service: Orthopedics;  Laterality: Right;   WRIST SURGERY      Family History  Problem Relation Age of Onset   Diabetes Mother    Heart disease Mother    Diabetes Father  Heart disease Father    Colon cancer Neg Hx     Social History   Socioeconomic History   Marital status: Single    Spouse name: Not on file   Number of children: Not on file   Years of education: Not on file   Highest education level: Not on file  Occupational History   Not on file  Tobacco Use   Smoking status: Every Day    Current packs/day: 0.50    Types: Cigarettes   Smokeless tobacco: Never   Tobacco comments:    5 ciggs per day - 05/07/2023  Vaping Use   Vaping status: Never Used  Substance and Sexual Activity    Alcohol use: Yes    Comment: beer and liquor twice weekly   Drug use: No    Comment: crack former- last about 3 years ago.   Sexual activity: Not on file  Other Topics Concern   Not on file  Social History Narrative   Not on file   Social Drivers of Health   Financial Resource Strain: Not on file  Food Insecurity: No Food Insecurity (07/22/2023)   Hunger Vital Sign    Worried About Running Out of Food in the Last Year: Never true    Ran Out of Food in the Last Year: Never true  Transportation Needs: No Transportation Needs (07/22/2023)   PRAPARE - Administrator, Civil Service (Medical): No    Lack of Transportation (Non-Medical): No  Physical Activity: Not on file  Stress: Not on file  Social Connections: Not on file  Intimate Partner Violence: Not At Risk (07/22/2023)   Humiliation, Afraid, Rape, and Kick questionnaire    Fear of Current or Ex-Partner: No    Emotionally Abused: No    Physically Abused: No    Sexually Abused: No    Outpatient Medications Prior to Visit  Medication Sig Dispense Refill   albuterol  (VENTOLIN  HFA) 108 (90 Base) MCG/ACT inhaler Inhale 2 puffs into the lungs every 6 (six) hours as needed for wheezing or shortness of breath. 8 g 3   atorvastatin  (LIPITOR) 20 MG tablet TAKE 1 TABLET BY MOUTH EVERY DAY 90 tablet 1   blood glucose meter kit and supplies Dispense based on patient and insurance preference. Use up to four times daily as directed. (FOR ICD-10 E10.9, E11.9). 1 each 0   cholestyramine  (QUESTRAN ) 4 GM/DOSE powder Take 0.5 packets (2 g total) by mouth daily. 348.6 g 1   cyclobenzaprine  (FLEXERIL ) 5 MG tablet Take 1 tablet (5 mg total) by mouth 2 (two) times daily as needed. 30 tablet 1   gabapentin  (NEURONTIN ) 400 MG capsule TAKE 1 CAPSULE BY MOUTH 2 TIMES DAILY. 60 capsule 5   ibuprofen  (ADVIL ) 200 MG tablet Take 400 mg by mouth 2 (two) times daily as needed for headache or moderate pain.     latanoprost (XALATAN) 0.005 %  ophthalmic solution Place 1 drop into both eyes at bedtime.     metoprolol  tartrate (LOPRESSOR ) 25 MG tablet TAKE 1 TABLET BY MOUTH (2) TIMES A DAY. 180 tablet 2   nitroGLYCERIN  (NITROSTAT ) 0.4 MG SL tablet Place 1 tablet (0.4 mg total) under the tongue every 5 (five) minutes as needed for chest pain. 10 tablet 1   Olmesartan -amLODIPine -HCTZ 20-5-12.5 MG TABS Take 1 tablet by mouth daily. 30 tablet 3   pantoprazole  (PROTONIX ) 40 MG tablet TAKE 1 TABLET BY MOUTH TWICE A DAY 60 tablet 11   tamsulosin  (FLOMAX ) 0.4 MG CAPS  capsule TAKE 1 CAPSULE BY MOUTH EVERY DAY 90 capsule 1   DULoxetine  (CYMBALTA ) 30 MG capsule TAKE 1 CAPSULE BY MOUTH EVERY DAY 90 capsule 1   glipiZIDE  (GLUCOTROL ) 5 MG tablet Take 1 tablet (5 mg total) by mouth 2 (two) times daily before a meal. 60 tablet 3   hydrochlorothiazide  (HYDRODIURIL ) 12.5 MG tablet Take 12.5 mg by mouth daily.     No facility-administered medications prior to visit.    Allergies  Allergen Reactions   Asa [Aspirin ] Nausea Only    ROS Review of Systems  Constitutional:  Negative for chills and fever.  HENT:  Negative for congestion and sore throat.   Eyes:  Positive for visual disturbance. Negative for pain and discharge.  Respiratory:  Negative for cough and shortness of breath.   Cardiovascular:  Negative for chest pain and palpitations.  Gastrointestinal:  Negative for nausea and vomiting.  Endocrine: Negative for polydipsia and polyuria.  Genitourinary:  Negative for dysuria and hematuria.  Musculoskeletal:  Positive for arthralgias and back pain. Negative for neck pain and neck stiffness.  Skin:  Negative for rash.  Neurological:  Positive for numbness (In left leg and feet). Negative for dizziness, weakness and headaches.  Psychiatric/Behavioral:  Negative for agitation and behavioral problems.       Objective:    Physical Exam Vitals reviewed.  Constitutional:      General: He is not in acute distress.    Appearance: He is not  diaphoretic.  HENT:     Head: Normocephalic and atraumatic.     Nose: Nose normal.     Mouth/Throat:     Mouth: Mucous membranes are moist.  Eyes:     General: No scleral icterus.    Extraocular Movements: Extraocular movements intact.  Cardiovascular:     Rate and Rhythm: Normal rate and regular rhythm.     Heart sounds: Normal heart sounds. No murmur heard. Pulmonary:     Breath sounds: Normal breath sounds. No wheezing or rales.  Musculoskeletal:     Cervical back: Neck supple. No tenderness.     Right lower leg: No edema.     Left lower leg: No edema.  Skin:    General: Skin is warm.     Findings: No rash.  Neurological:     General: No focal deficit present.     Mental Status: He is alert and oriented to person, place, and time.     Motor: No weakness.  Psychiatric:        Mood and Affect: Mood normal.        Behavior: Behavior normal.     BP 109/70   Pulse 81   Ht 5' 11 (1.803 m)   Wt 196 lb 12.8 oz (89.3 kg)   SpO2 98%   BMI 27.45 kg/m  Wt Readings from Last 3 Encounters:  05/23/24 196 lb 12.8 oz (89.3 kg)  05/12/24 196 lb 4.8 oz (89 kg)  03/10/24 192 lb 12.8 oz (87.5 kg)    Lab Results  Component Value Date   TSH 0.999 01/17/2024   Lab Results  Component Value Date   WBC 7.7 08/13/2023   HGB 13.2 08/13/2023   HCT 41.5 08/13/2023   MCV 95 08/13/2023   PLT 309 08/13/2023   Lab Results  Component Value Date   NA 138 01/17/2024   K 3.8 01/17/2024   CO2 25 01/17/2024   GLUCOSE 174 (H) 01/17/2024   BUN 10 01/17/2024   CREATININE 1.10 01/17/2024  BILITOT 0.2 01/17/2024   ALKPHOS 96 01/17/2024   AST 35 01/17/2024   ALT 45 (H) 01/17/2024   PROT 6.9 01/17/2024   ALBUMIN 4.3 01/17/2024   CALCIUM  9.5 01/17/2024   ANIONGAP 8 09/11/2023   EGFR 79 01/17/2024   Lab Results  Component Value Date   CHOL 103 08/13/2023   Lab Results  Component Value Date   HDL 27 (L) 08/13/2023   Lab Results  Component Value Date   LDLCALC 27 08/13/2023    Lab Results  Component Value Date   TRIG 335 (H) 08/13/2023   Lab Results  Component Value Date   CHOLHDL 3.8 08/13/2023   Lab Results  Component Value Date   HGBA1C 8.5 (H) 01/17/2024      Assessment & Plan:   Problem List Items Addressed This Visit       Cardiovascular and Mediastinum   Essential hypertension - Primary   BP Readings from Last 1 Encounters:  05/23/24 109/70   Well-controlled with olmesartan -amlodipine -hydrochlorothiazide  20-5-12.5 mg once daily, refilled Recently stopped metoprolol  25 mg BID due to ED Counseled for compliance with the medications Advised DASH diet and moderate exercise/walking, at least 150 mins/week      Relevant Medications   tadalafil (CIALIS) 10 MG tablet     Endocrine   Type 2 diabetes mellitus with diabetic neuropathy, unspecified (HCC)   Lab Results  Component Value Date   HGBA1C 8.5 (H) 01/17/2024   POC HbA1c: 9.7  Uncontrolled due to diet noncompliance, has improved diet recently, but takes soda and alcohol Did not tolerate metformin  On glipizide , increased dose to 10 mg BID now Advised to follow diabetic diet - needs to cut down sweets and alcohol intake On statin and ARB F/u CMP and lipid panel Diabetic eye exam: Advised to follow up with Ophthalmology for diabetic eye exam  On gabapentin  400 mg twice daily for neuropathy, had added Cymbalta  30 mg QD, increased dose to 60 mg QD due to persistent symptoms If persistent, will refer to Neurology      Relevant Medications   DULoxetine  (CYMBALTA ) 60 MG capsule   glipiZIDE  (GLUCOTROL ) 10 MG tablet   Other Relevant Orders   Bayer DCA Hb A1c Waived     Other   Mixed hyperlipidemia (Chronic)   Checked lipid profile On Atorvastatin  20 mg QD      Relevant Medications   tadalafil (CIALIS) 10 MG tablet   Erectile dysfunction   Has stopped metoprolol  now, but still has difficulty maintaining erection Prescribed Cialis 10 mg as needed      Relevant Medications    tadalafil (CIALIS) 10 MG tablet     Meds ordered this encounter  Medications   DULoxetine  (CYMBALTA ) 60 MG capsule    Sig: Take 1 capsule (60 mg total) by mouth daily.    Dispense:  30 capsule    Refill:  5    G62.9   tadalafil (CIALIS) 10 MG tablet    Sig: Take 1 tablet (10 mg total) by mouth daily as needed for erectile dysfunction.    Dispense:  10 tablet    Refill:  1   glipiZIDE  (GLUCOTROL ) 10 MG tablet    Sig: Take 1 tablet (10 mg total) by mouth 2 (two) times daily before a meal.    Dispense:  60 tablet    Refill:  3    Dose change - please cancel 5 mg prescription.    Follow-up: Return in about 4 months (around 09/23/2024) for Annual physical.  Suzzane MARLA Blanch, MD

## 2024-05-23 NOTE — Addendum Note (Signed)
 Addended byBETHA TOBIE DOWNS on: 05/23/2024 12:02 PM   Modules accepted: Orders

## 2024-05-23 NOTE — Assessment & Plan Note (Signed)
Checked lipid profile On Atorvastatin 20 mg QD

## 2024-05-23 NOTE — Patient Instructions (Addendum)
 Please start taking Duloxetine  as prescribed for neuropathy. Please continue taking Gabapentin .  Please continue to take medications as prescribed.  Please continue to follow low carb diet and perform moderate exercise/walking at least 150 mins/week.

## 2024-05-23 NOTE — Assessment & Plan Note (Signed)
 Has stopped metoprolol  now, but still has difficulty maintaining erection Prescribed Cialis 10 mg as needed

## 2024-05-23 NOTE — Assessment & Plan Note (Addendum)
 Lab Results  Component Value Date   HGBA1C 8.5 (H) 01/17/2024   POC HbA1c: 9.7  Uncontrolled due to diet noncompliance, has improved diet recently, but takes soda and alcohol Did not tolerate metformin  On glipizide , increased dose to 10 mg BID now Advised to follow diabetic diet - needs to cut down sweets and alcohol intake On statin and ARB F/u CMP and lipid panel Diabetic eye exam: Advised to follow up with Ophthalmology for diabetic eye exam  On gabapentin  400 mg twice daily for neuropathy, had added Cymbalta  30 mg QD, increased dose to 60 mg QD due to persistent symptoms If persistent, will refer to Neurology

## 2024-05-23 NOTE — Assessment & Plan Note (Signed)
 BP Readings from Last 1 Encounters:  05/23/24 109/70   Well-controlled with olmesartan -amlodipine -hydrochlorothiazide  20-5-12.5 mg once daily, refilled Recently stopped metoprolol  25 mg BID due to ED Counseled for compliance with the medications Advised DASH diet and moderate exercise/walking, at least 150 mins/week

## 2024-05-27 LAB — BAYER DCA HB A1C WAIVED: HB A1C (BAYER DCA - WAIVED): 9.7 % — ABNORMAL HIGH (ref 4.8–5.6)

## 2024-06-13 ENCOUNTER — Other Ambulatory Visit: Payer: Self-pay | Admitting: Internal Medicine

## 2024-06-13 DIAGNOSIS — I10 Essential (primary) hypertension: Secondary | ICD-10-CM

## 2024-06-15 ENCOUNTER — Other Ambulatory Visit: Payer: Self-pay | Admitting: Internal Medicine

## 2024-06-15 DIAGNOSIS — E1140 Type 2 diabetes mellitus with diabetic neuropathy, unspecified: Secondary | ICD-10-CM

## 2024-06-23 ENCOUNTER — Other Ambulatory Visit: Payer: Self-pay | Admitting: Internal Medicine

## 2024-06-23 DIAGNOSIS — E782 Mixed hyperlipidemia: Secondary | ICD-10-CM

## 2024-07-15 ENCOUNTER — Other Ambulatory Visit: Payer: Self-pay | Admitting: Internal Medicine

## 2024-07-15 DIAGNOSIS — N529 Male erectile dysfunction, unspecified: Secondary | ICD-10-CM

## 2024-07-21 ENCOUNTER — Ambulatory Visit: Admitting: Orthopedic Surgery

## 2024-07-21 ENCOUNTER — Telehealth: Payer: Self-pay | Admitting: Orthopedic Surgery

## 2024-07-21 ENCOUNTER — Other Ambulatory Visit (INDEPENDENT_AMBULATORY_CARE_PROVIDER_SITE_OTHER): Payer: Self-pay

## 2024-07-21 ENCOUNTER — Encounter: Payer: Self-pay | Admitting: Orthopedic Surgery

## 2024-07-21 DIAGNOSIS — Z96641 Presence of right artificial hip joint: Secondary | ICD-10-CM

## 2024-07-21 NOTE — Progress Notes (Signed)
   Chief Complaint  Patient presents with   Follow-up    Total hip in 2023 right hip     Encounter Diagnosis  Name Primary?   S/P hip replacement, right Yes    What pharmacy do you use ? [address] [address]_  DOI/DOS/ Date: year ago July 2023  Resolved- pt states he is here for a follow up on hip surgery back on 05/16/22  David Knox is doing well with his right hip no problems  He complains of mild discomfort in his left hip  Physical Exam Constitutional:      Appearance: Normal appearance. He is normal weight.  HENT:     Head: Normocephalic and atraumatic.  Musculoskeletal:        General: No swelling, tenderness or deformity. Normal range of motion.  Neurological:     Mental Status: He is alert.     Motor: No weakness.     Coordination: Coordination normal.     Gait: Gait normal.       DG HIP UNILAT WITH PELVIS 2-3 VIEWS RIGHT Result Date: 07/21/2024 Right total hip replacement 2 years ago No complaints Femoral stem no signs of loosening good position.  Lateral femur bone spur which is an outgrowth from the femur which has been present for several years.  Cup press-fit excellent abduction angle. No loosening. 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

## 2024-07-21 NOTE — Progress Notes (Signed)
   There were no vitals taken for this visit.  There is no height or weight on file to calculate BMI.  No chief complaint on file.   No diagnosis found.  What pharmacy do you use ? [facility] [address]_  DOI/DOS/ Date: year ago July 2023  Resolved- pt states he is here for a follow up on hip surgery back on 05/16/22

## 2024-07-21 NOTE — Telephone Encounter (Signed)
 Patient lvm stating he is ready to move forward w/the lt hip surgery.  [phone]

## 2024-07-21 NOTE — Telephone Encounter (Signed)
 Dr. Areatha pt - pt lvm stating he was seen today and needs to speak to Dr. VEAR about doing surgery on his lt hip (not sure if that was discussed today or not) [phone]

## 2024-07-22 NOTE — Progress Notes (Signed)
 GI Office Note    Referring Provider: Tobie Suzzane POUR, MD Primary Care Physician:  Tobie Suzzane POUR, MD Primary Gastroenterologist: Carlin POUR. Cindie, DO  Date:  07/23/2024  ID:  David Knox, DOB 26-Jun-1966, MRN [id number]  Chief Complaint   Chief Complaint  Patient presents with   Follow-up    Follow up. No problems    History of Present Illness  David Knox is a 58 y.o. male with a history of diabetes, HLD, HTN, GERD, prior cocaine abuse, cholecystitis s/p cholecystectomy in September 2024 presenting today with no significant complaints.   OV 06/29/2022 to discuss scheduling first-ever screening colonoscopy.  He denied any melena, BRBPR, unintentional weight loss, family history of colon cancer.  Did report postprandial bowel movements stating is a chronic issue.  Patient has a bowel movement 30 minutes after eating.  Mostly formed stools, sometimes runny.  Dairy products can cause looser stools.  Sometimes having 5-6 BMs daily without nocturnal stools.  Denied abdominal pain.  GERD controlled with omeprazole 20 mg over-the-counter daily.  Patient reported not being bothered by stools, lactose intolerance suspected.  Advised screening for celiac disease with serologies.  Prior thyroid levels within normal limits. Scheduled for colonoscopy (urine drug screen advised).  Advised Lactaid prior to dairy consumption and advised to stop omeprazole start pantoprazole  in case omeprazole causes frequent diarrhea.   IgA elevated, however TTG IgA negative.   Colonoscopy 07/27/2022: -Transverse and descending diverticulosis -4 mm polyp in descending colon -Diminutive hyperplastic polyp, negative for dysplasia -Repeat colonoscopy in 10 years   OV 10/26/22. Having loose BM after every meal. Occurring sooner after meals. Still has gallbladder and reports this is same issues as his wife. Tries to avoid dairy products. Stools are bristol 5-7. Started metformin  in July /August 2023 after hip  surgery. Originally stated symptoms began in July/August of 2023. GERD controlled. Advised imodium  as needed. Advised to discuss stopping metformin  with PCP. Continue pantoprazole  daily. Advised potential further workup with fecal elastase and possible abdominal imaging if imodium  and stopping metformin  not helpful. FECAL ELASTASE NEVER PERFORMED.    EGD 01/18/23: -3 cm hiatal hernia -Grade a reflux esophagitis without bleeding s/p biopsy -Gastritis s/p biopsy -Duodenitis with history of biopsy -Pathology -duodenal biopsies with foveolar metaplasia suggesting peptic injury gastric biopsies with reactive gastropathy, negative for H. pylori, GE junction with mild reflux changes - Advised PPI BID   PCP recommended decreasing his metformin  from 1000 mg twice daily to 500 mg daily given his reports of diarrhea.  He was advised to continue to take Imodium  as needed.  Also recommended over-the-counter probiotic.   OV 06/13/2023.  Patient reported he was taking Imodium  once daily and having 4-5 bowel movements per day, usually in small amounts.  Usually occurs within 10-15 minutes of meals.  Stools described as [place] 6-7.  Denied any eating dairy products, reports pieces of lettuce in his stool.  Denied any nausea or vomiting.  GERD pretty well-controlled without dysphagia.  Again advised to check fecal elastase, continue Imodium  1-2 times a day and could schedule first dose every morning.  Continue PPI twice daily.  Advised we could consider trial of pancreatic enzymes if elastase is low versus cholestyramine .  Encouraged to continue to avoid lactose.   Hospitalization at [facility] 9/15 - 07/25/23.  He presented with 5 days of RUQ pain as well as chest pain and vomiting.  He was diagnosed with acute cholecystitis and underwent cholecystectomy with Dr. Kallie with need for JP drain  placement given gallbladder was thickened and distended and had purulent drainage.  He was noted to have significant impaction of stones  and inflammation within the gallbladder.  He was treated with Augmentin .   He was seen by Dr. Kallie on 9/26 for follow-up post cholecystectomy.  Patient reportedly feeling well and had minimal output from JP drain therefore it was removed and dressing placed.   OV 08/29/23.  Still having bowel movements about 30 minutes after meals, usually depending on what he eats.  Usually does occur with fatty or greasy meals.  Reflux doing good as long as he takes medication.  Denies any significant breakthrough symptoms.  Reported good appetite.  Does have some shortness of breath and chest tightness with exertion, going to see cardiology the following day.  Recommended Imodium  if going out, following a gallbladder eating plan and continuing pantoprazole  40 mg twice daily.   OV 12/06/2023.  Having lots of diarrhea, having to take Pepto at times and then would have black stool afterwards.  Stools have been more watery.  Having frequent bowel movements about 5-10 minutes post meals.  Reported some mild weight gain, no weight loss.  Denied nausea, lack of appetite, early satiety, vomiting, or abdominal pain.  Does report some increased abdominal distention at times.  GERD well-controlled with pantoprazole .  Advised to check fecal elastase and do a trial of cholestyramine  4 g once daily and increase as needed.  Advised Imodium  for severe refractory diarrhea.  Continue PPI twice daily and follow low-fat diet.  Follow-up 6 weeks.   Fecal elastase 273.  Low end of normal.  Advised continue cholestyramine .   OV 01/23/24.  Taking half dose Imodium  prior to going out to eat that way he can ensure he makes it home before needing to have a bowel movement.  Stools have not been hard and have firmed up much more since last visit.  Denies any abdominal pain.  Having some mild improvement in urgency since last visit.  He had been taking cholestyramine  4 g every other day. Advised consideration of zenpep in the future if having  issues still on cholestyramine .   OV 03/10/24. Aving rectal pain and having some relief with otc hemorrhoid cream. Denied knifelike pain. Having to walk off balance and painful to sit. Associated with some itching. No abdominal pain and continued to take questran  every other day, 1-2 BM daily. Advised preparation h with lidocaine  and if no improvement will treat empirically for fissure. Discussed hemorrhoid banding as treatment option.   Last office visit 05/12/24.  Having urgency 15-20 minutes after eating meals.,  He only eats 1 egg he does not have much trouble with if he eats more than this then he needs to be close to the restroom.  Taking Questran  4 g every other day he is having more than 3 mushy bowel movements early in the day and sometimes having a hard bowel movement.  Sometimes these bowel movements have little amounts.  Denied any melena or BRBPR.  When sneezing potentially having some hemorrhoid tissue prolapse.  Having occasional itching. Advised cholestyramine  2 g daily.  Use over-the-counter steroid cream or Preparation H for hemorrhoids as needed.  Discussed hemorrhoid banding.  Discussed ongoing low-fat diet and use of pantoprazole  40 mg twice daily.  Let me know how bowel function is doing in 2 weeks, again discussed titrating Questran  or trial of pancreatic enzymes.   Today:  Discussed the use of AI scribe software for clinical note transcription with the patient,  who gave verbal consent to proceed. History of Present Illness  He experiences a 'tingly burning' sensation associated with hemorrhoids. He has been using a topical cream twice daily for over two weeks, which has provided some relief. Symptoms tend to flare up after leaving the clinic, lasting only a day or two. Warm showers are helpful in managing symptoms.  There is occasional light bleeding on tissue, but not in the commode or on the stool. He has been using over-the-counter Preparation H for symptom management and over  the counter lidocaine . He is currently taking cholestyramine , with a regimen of half a scoop in the morning and half in the evening, which is effective (2g twice daily). He takes his morning medications before consuming cholestyramine  and ensure a two to three-hour gap between the evening dose and nighttime medications, which include gabapentin , glipizide , and occasionally Flexeril .  No issues with constipation, abdominal pain, nausea, or vomiting are reported. He has no other complaints or problems at this time. No other upper GI symptoms present.     Wt Readings from Last 5 Encounters:  07/23/24 195 lb 3.2 oz (88.5 kg)  05/23/24 196 lb 12.8 oz (89.3 kg)  05/12/24 196 lb 4.8 oz (89 kg)  03/10/24 192 lb 12.8 oz (87.5 kg)  02/25/24 191 lb (86.6 kg)    Current Outpatient Medications  Medication Sig Dispense Refill   albuterol  (VENTOLIN  HFA) 108 (90 Base) MCG/ACT inhaler Inhale 2 puffs into the lungs every 6 (six) hours as needed for wheezing or shortness of breath. 8 g 3   atorvastatin  (LIPITOR) 20 MG tablet TAKE 1 TABLET BY MOUTH EVERY DAY 90 tablet 1   blood glucose meter kit and supplies Dispense based on patient and insurance preference. Use up to four times daily as directed. (FOR ICD-10 E10.9, E11.9). 1 each 0   cholestyramine  (QUESTRAN ) 4 GM/DOSE powder Take 0.5 packets (2 g total) by mouth daily. 348.6 g 1   cyclobenzaprine  (FLEXERIL ) 5 MG tablet Take 1 tablet (5 mg total) by mouth 2 (two) times daily as needed. 30 tablet 1   DULoxetine  (CYMBALTA ) 60 MG capsule TAKE 1 CAPSULE BY MOUTH EVERY DAY 90 capsule 2   gabapentin  (NEURONTIN ) 400 MG capsule TAKE 1 CAPSULE BY MOUTH 2 TIMES DAILY. 60 capsule 5   glipiZIDE  (GLUCOTROL ) 10 MG tablet Take 1 tablet (10 mg total) by mouth 2 (two) times daily before a meal. 60 tablet 3   ibuprofen  (ADVIL ) 200 MG tablet Take 400 mg by mouth 2 (two) times daily as needed for headache or moderate pain.     latanoprost (XALATAN) 0.005 % ophthalmic solution  Place 1 drop into both eyes at bedtime.     metoprolol  tartrate (LOPRESSOR ) 25 MG tablet TAKE 1 TABLET BY MOUTH (2) TIMES A DAY. 180 tablet 2   nitroGLYCERIN  (NITROSTAT ) 0.4 MG SL tablet Place 1 tablet (0.4 mg total) under the tongue every 5 (five) minutes as needed for chest pain. 10 tablet 1   Olmesartan -amLODIPine -HCTZ 20-5-12.5 MG TABS TAKE 1 TABLET BY MOUTH EVERY DAY 90 tablet 1   pantoprazole  (PROTONIX ) 40 MG tablet TAKE 1 TABLET BY MOUTH TWICE A DAY 60 tablet 11   tadalafil  (CIALIS ) 10 MG tablet TAKE 1 TABLET BY MOUTH DAILY AS NEEDED FOR ERECTILE DYSFUNCTION 30 tablet 1   tamsulosin  (FLOMAX ) 0.4 MG CAPS capsule TAKE 1 CAPSULE BY MOUTH EVERY DAY 90 capsule 1   No current facility-administered medications for this visit.    Past Medical History:  Diagnosis Date  Acid reflux    Diabetes mellitus without complication (HCC)    HLD (hyperlipidemia)    HTN (hypertension)     Past Surgical History:  Procedure Laterality Date   BIOPSY  01/18/2023   Procedure: BIOPSY;  Surgeon: Cindie Carlin POUR, DO;  [facility];  Service: Endoscopy;;   CHOLECYSTECTOMY N/A 07/23/2023   Procedure: LAPAROSCOPIC CHOLECYSTECTOMY;  Surgeon: Kallie Manuelita BROCKS, MD;  Location: [facility];  Service: General;  Laterality: N/A;   COLONOSCOPY WITH PROPOFOL  N/A 07/27/2022   Procedure: COLONOSCOPY WITH PROPOFOL ;  Surgeon: Cindie Carlin POUR, DO;  Location: [facility];  Service: Endoscopy;  Laterality: N/A;  9:00 am  ASA 2   ESOPHAGOGASTRODUODENOSCOPY  2002   RMR: ulcerative reflux esophagitis, moderate sized hiatal hernia   ESOPHAGOGASTRODUODENOSCOPY (EGD) WITH PROPOFOL  N/A 01/18/2023   Procedure: ESOPHAGOGASTRODUODENOSCOPY (EGD) WITH PROPOFOL ;  Surgeon: Cindie Carlin POUR, DO;  Location: [facility];  Service: Endoscopy;  Laterality: N/A;  1:45 pm, asa 2   HIP SURGERY Bilateral    as a child   POLYPECTOMY  07/27/2022   Procedure: POLYPECTOMY;  Surgeon: Cindie Carlin POUR, DO;  Location: [facility];   Service: Endoscopy;;   TOOTH EXTRACTION     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   Procedure: TOTAL HIP ARTHROPLASTY;  Surgeon: Margrette Taft BRAVO, MD;  Location: [facility];  Service: Orthopedics;  Laterality: Right;   WRIST SURGERY      Family History  Problem Relation Age of Onset   Diabetes Mother    Heart disease Mother    Diabetes Father    Heart disease Father    Colon cancer Neg Hx     Allergies as of 07/23/2024 - Review Complete 07/23/2024  Allergen Reaction Noted   Dorethia Puma ] Nausea Only 05/18/2022    Social History   Socioeconomic History   Marital status: Single    Spouse name: Not on file   Number of children: Not on file   Years of education: Not on file   Highest education level: Not on file  Occupational History   Not on file  Tobacco Use   Smoking status: Every Day    Current packs/day: 0.50    Types: Cigarettes   Smokeless tobacco: Never   Tobacco comments:    5 ciggs per day - 05/07/2023  Vaping Use   Vaping status: Never Used  Substance and Sexual Activity   Alcohol use: Yes    Comment: beer and liquor twice weekly   Drug use: No    Comment: crack former- last about 3 years ago.   Sexual activity: Not on file  Other Topics Concern   Not on file  Social History Narrative   Not on file   Social Drivers of Health   Financial Resource Strain: Not on file  Food Insecurity: No Food Insecurity (07/22/2023)   Hunger Vital Sign    Worried About Running Out of Food in the Last Year: Never true    Ran Out of Food in the Last Year: Never true  Transportation Needs: No Transportation Needs (07/22/2023)   PRAPARE - Administrator, Civil Service (Medical): No    Lack of Transportation (Non-Medical): No  Physical Activity: Not on file  Stress: Not on file  Social Connections: Not on file     Review of Systems   Gen: Denies fever, chills, anorexia. Denies fatigue, weakness, weight loss.  CV: Denies chest pain, palpitations,  syncope, peripheral edema, and claudication. Resp: Denies dyspnea  at rest, cough, wheezing, coughing up blood, and pleurisy. GI: See HPI Derm: Denies rash, itching, dry skin Psych: Denies depression, anxiety, memory loss, confusion. No homicidal or suicidal ideation.  Heme: Denies bruising, bleeding, and enlarged lymph nodes.  Physical Exam   BP (!) 156/100 (BP Location: Left Arm, Patient Position: Sitting, Cuff Size: Large)   Pulse 76   Temp 97.9 F (36.6 C) (Temporal)   Ht 5' 11 (1.803 m)   Wt 195 lb 3.2 oz (88.5 kg)   BMI 27.22 kg/m   General:   Alert and oriented. No distress noted. Pleasant and cooperative.  Head:  Normocephalic and atraumatic. Eyes:  Conjuctiva clear without scleral icterus. Mouth:  Oral mucosa pink and moist. Good dentition. No lesions. Abdomen:  non-distended. Rectal: deferred Msk:  Symmetrical without gross deformities. Normal posture. Extremities:  Without edema. Neurologic:  Alert and  oriented x4 Psych:  Alert and cooperative. Normal mood and affect.  Assessment  David Knox is a 58 y.o. male presenting today for follow up with no complaints.  Assessment & Plan Internal hemorrhoids Internal hemorrhoids with intermittent flares causing tingling and burning sensations. Symptoms managed with OTC hemorrhoid cream used twice daily for over the last two weeks. No significant bleeding, only light spotting on tissue. Symptoms primarily internal, not external, based on past colonoscopy findings. Current symptoms do not warrant immediate intervention. Repeated flares can lead to increased severity and potential need for surgical intervention if hemorrhoids become grade IV. Discussed hemorrhoid banding in detail today and advised sooner intervention than later especially if flares become more frequent.  - Continue current topical cream regimen. - Consider hemorrhoid banding - Educate on sitz baths and warm showers for symptom relief.  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 Current regimen involves taking half a scoop in the morning and half in the evening before meals. No constipation reported. Timing of cholestyramine  intake adjusted to avoid interference with medication absorption, particularly glipizide . Emphasized the importance of maintaining a 2-3 hour gap between cholestyramine  and nighttime medications to ensure efficacy. - Continue current cholestyramine  regimen with 2g BID. - Ensure at least a 2-3 hour gap between cholestyramine  and nighttime medications and taking 1 hour after morning medications. - Monitor for any changes in bowel habits or medication efficacy.   PLAN   Follow up 6 months.   Charmaine Melia, MSN, FNP-BC, AGACNP-BC [facility]

## 2024-07-23 ENCOUNTER — Ambulatory Visit: Admitting: Gastroenterology

## 2024-07-23 ENCOUNTER — Encounter: Payer: Self-pay | Admitting: Gastroenterology

## 2024-07-23 VITALS — BP 156/100 | HR 76 | Temp 97.9°F | Ht 71.0 in | Wt 195.2 lb

## 2024-07-23 DIAGNOSIS — K648 Other hemorrhoids: Secondary | ICD-10-CM | POA: Diagnosis not present

## 2024-07-23 DIAGNOSIS — Z9049 Acquired absence of other specified parts of digestive tract: Secondary | ICD-10-CM

## 2024-07-23 DIAGNOSIS — K641 Second degree hemorrhoids: Secondary | ICD-10-CM

## 2024-07-23 DIAGNOSIS — K529 Noninfective gastroenteritis and colitis, unspecified: Secondary | ICD-10-CM | POA: Diagnosis not present

## 2024-07-23 NOTE — Patient Instructions (Addendum)
 Plan for hemorrhoid banding if your symptoms worsen or return of frequent bleeding.  Continue cholestyramine  2g twice daily as you have been in regards to taking between your medications.  Reach out anytime if you need refills.  Please let me know if symptoms worsen and feel free to make an appointment sooner than the 6 months.  Please call anytime or send a MyChart message if you decide you want to do the hemorrhoid banding.  If I do not see you before the holidays I hope you have a great Thanksgiving and Christmas and a happy new year!  It was a pleasure to see you today. I want to create trusting relationships with patients. If you receive a survey regarding your visit,  I greatly appreciate you taking time to fill this out on paper or through your MyChart. I value your feedback.  Charmaine Melia, MSN, FNP-BC, AGACNP-BC [facility]

## 2024-07-25 NOTE — Telephone Encounter (Signed)
 Dr. Areatha pt - pt lvm after hours today upset bc no one has called him back.  I called him, he is wanting to move forward w/scheduling surgery.  Please call him on Monday.

## 2024-07-28 NOTE — Telephone Encounter (Signed)
 Yes, I left message for him about what surgery he is expecting I called him he is ready to proceed with a left hip surgery Please advise.

## 2024-07-28 NOTE — Telephone Encounter (Signed)
 This is the first message I have gotten. I called him to see what surgery he is wanting.  I left message for him to call me back

## 2024-07-28 NOTE — Telephone Encounter (Signed)
 Patient lvm stating he received a call this morning.  [phone]

## 2024-07-29 NOTE — Telephone Encounter (Signed)
 Dr Margrette wants to see him in 2 weeks I called him to let him know

## 2024-08-13 ENCOUNTER — Ambulatory Visit: Admitting: Orthopedic Surgery

## 2024-08-19 ENCOUNTER — Other Ambulatory Visit: Payer: Self-pay | Admitting: Internal Medicine

## 2024-08-19 DIAGNOSIS — E1140 Type 2 diabetes mellitus with diabetic neuropathy, unspecified: Secondary | ICD-10-CM

## 2024-08-28 ENCOUNTER — Other Ambulatory Visit (INDEPENDENT_AMBULATORY_CARE_PROVIDER_SITE_OTHER): Payer: Self-pay

## 2024-08-28 ENCOUNTER — Ambulatory Visit: Admitting: Orthopedic Surgery

## 2024-08-28 ENCOUNTER — Encounter: Payer: Self-pay | Admitting: Orthopedic Surgery

## 2024-08-28 ENCOUNTER — Telehealth: Payer: Self-pay

## 2024-08-28 ENCOUNTER — Other Ambulatory Visit: Payer: Self-pay | Admitting: Internal Medicine

## 2024-08-28 VITALS — Ht 71.0 in | Wt 195.0 lb

## 2024-08-28 DIAGNOSIS — M25512 Pain in left shoulder: Secondary | ICD-10-CM | POA: Diagnosis not present

## 2024-08-28 DIAGNOSIS — Z96641 Presence of right artificial hip joint: Secondary | ICD-10-CM

## 2024-08-28 DIAGNOSIS — M1612 Unilateral primary osteoarthritis, left hip: Secondary | ICD-10-CM | POA: Diagnosis not present

## 2024-08-28 DIAGNOSIS — E1140 Type 2 diabetes mellitus with diabetic neuropathy, unspecified: Secondary | ICD-10-CM

## 2024-08-28 DIAGNOSIS — M7552 Bursitis of left shoulder: Secondary | ICD-10-CM

## 2024-08-28 DIAGNOSIS — E0590 Thyrotoxicosis, unspecified without thyrotoxic crisis or storm: Secondary | ICD-10-CM

## 2024-08-28 NOTE — Progress Notes (Signed)
   Patient: David Knox           Date of Birth: 11-01-1966           MRN: [id number] Visit Date: 08/28/2024 Requested by: Tobie Suzzane POUR, MD [address] [place],  [place] [address] PCP: Tobie Suzzane POUR, MD  Encounter Diagnoses  Name Primary?   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 Tobie to recommend medical management of his diabetes  The bursitis should improve with exercises and local measures  No orders of the defined types were placed in this encounter.    Chief Complaint  Patient presents with   Post-op Follow-up    Right hip improved    Hip Pain    Left hip ready for surgery     History:  58 year old male successful right total hip arthroplasty via direct lateral approach presents with left hip pain and has decided to move forward with left total hip arthroplasty  Focused exam findings:  Obligatory external rotation when the hip is flexed pain with internal rotation limited internal rotation of the left hip Slight leg length discrepancy maybe 4 to 5 mm   No results found.

## 2024-08-28 NOTE — Telephone Encounter (Signed)
 Copied from CRM [id number]. Topic: Clinical - Request for Lab/Test Order >> Aug 28, 2024  4:03 PM Delon DASEN wrote: Reason for CRM: Need A1c checked before surgery please call to schedule [phone]

## 2024-08-28 NOTE — Progress Notes (Signed)
    08/28/2024   Chief Complaint  Patient presents with   Post-op Follow-up    Right hip improved    Hip Pain    Left hip ready for surgery     Encounter Diagnosis  Name Primary?   S/P hip replacement, right 05/16/22 Yes    What pharmacy do you use ? ___[facility] ________________________  DOI/DOS/ Date:    Worse ready to discuss surgery on left hip

## 2024-08-29 NOTE — Telephone Encounter (Signed)
Pt informed

## 2024-09-02 ENCOUNTER — Ambulatory Visit: Payer: Self-pay | Admitting: Internal Medicine

## 2024-09-02 LAB — CMP14+EGFR
ALT: 38 IU/L (ref 0–44)
AST: 23 IU/L (ref 0–40)
Albumin: 4.3 g/dL (ref 3.8–4.9)
Alkaline Phosphatase: 84 IU/L (ref 47–123)
BUN/Creatinine Ratio: 14 (ref 9–20)
BUN: 16 mg/dL (ref 6–24)
Bilirubin Total: 0.3 mg/dL (ref 0.0–1.2)
CO2: 21 mmol/L (ref 20–29)
Calcium: 9.2 mg/dL (ref 8.7–10.2)
Chloride: 98 mmol/L (ref 96–106)
Creatinine, Ser: 1.17 mg/dL (ref 0.76–1.27)
Globulin, Total: 2.5 g/dL (ref 1.5–4.5)
Glucose: 207 mg/dL — ABNORMAL HIGH (ref 70–99)
Potassium: 4 mmol/L (ref 3.5–5.2)
Sodium: 137 mmol/L (ref 134–144)
Total Protein: 6.8 g/dL (ref 6.0–8.5)
eGFR: 73 mL/min/1.73 (ref >59)

## 2024-09-02 LAB — TSH+FREE T4
Free T4: 0.94 ng/dL (ref 0.82–1.77)
TSH: 0.575 u[IU]/mL (ref 0.450–4.500)

## 2024-09-02 LAB — HEMOGLOBIN A1C
Est. average glucose Bld gHb Est-mCnc: 246 mg/dL
Hgb A1c MFr Bld: 10.2 % — ABNORMAL HIGH (ref 4.8–5.6)

## 2024-09-16 ENCOUNTER — Ambulatory Visit: Payer: Medicaid Other | Admitting: Podiatry

## 2024-09-24 ENCOUNTER — Ambulatory Visit

## 2024-09-24 ENCOUNTER — Ambulatory Visit: Admitting: Podiatry

## 2024-09-24 ENCOUNTER — Other Ambulatory Visit (HOSPITAL_COMMUNITY): Payer: Self-pay

## 2024-09-24 ENCOUNTER — Other Ambulatory Visit: Payer: Self-pay | Admitting: Internal Medicine

## 2024-09-24 ENCOUNTER — Encounter: Payer: Self-pay | Admitting: Podiatry

## 2024-09-24 ENCOUNTER — Ambulatory Visit (INDEPENDENT_AMBULATORY_CARE_PROVIDER_SITE_OTHER): Admitting: Internal Medicine

## 2024-09-24 ENCOUNTER — Encounter: Payer: Self-pay | Admitting: Internal Medicine

## 2024-09-24 ENCOUNTER — Telehealth: Payer: Self-pay | Admitting: Pharmacy Technician

## 2024-09-24 VITALS — BP 118/80 | HR 96 | Ht 71.0 in | Wt 192.0 lb

## 2024-09-24 DIAGNOSIS — E1140 Type 2 diabetes mellitus with diabetic neuropathy, unspecified: Secondary | ICD-10-CM | POA: Diagnosis not present

## 2024-09-24 DIAGNOSIS — R110 Nausea: Secondary | ICD-10-CM

## 2024-09-24 DIAGNOSIS — I10 Essential (primary) hypertension: Secondary | ICD-10-CM

## 2024-09-24 DIAGNOSIS — M722 Plantar fascial fibromatosis: Secondary | ICD-10-CM

## 2024-09-24 DIAGNOSIS — N401 Enlarged prostate with lower urinary tract symptoms: Secondary | ICD-10-CM

## 2024-09-24 DIAGNOSIS — Z7984 Long term (current) use of oral hypoglycemic drugs: Secondary | ICD-10-CM

## 2024-09-24 DIAGNOSIS — Z0001 Encounter for general adult medical examination with abnormal findings: Secondary | ICD-10-CM | POA: Diagnosis not present

## 2024-09-24 DIAGNOSIS — E782 Mixed hyperlipidemia: Secondary | ICD-10-CM

## 2024-09-24 DIAGNOSIS — R350 Frequency of micturition: Secondary | ICD-10-CM

## 2024-09-24 DIAGNOSIS — Z125 Encounter for screening for malignant neoplasm of prostate: Secondary | ICD-10-CM

## 2024-09-24 DIAGNOSIS — E559 Vitamin D deficiency, unspecified: Secondary | ICD-10-CM

## 2024-09-24 DIAGNOSIS — N529 Male erectile dysfunction, unspecified: Secondary | ICD-10-CM

## 2024-09-24 MED ORDER — ONDANSETRON HCL 4 MG PO TABS: 4.0000 mg | ORAL_TABLET | Freq: Three times a day (TID) | ORAL | 0 refills | Status: AC | PRN

## 2024-09-24 MED ORDER — OZEMPIC (0.25 OR 0.5 MG/DOSE) 2 MG/3ML ~~LOC~~ SOPN: 0.2500 mg | PEN_INJECTOR | SUBCUTANEOUS | 0 refills | Status: DC

## 2024-09-24 MED ORDER — TADALAFIL 20 MG PO TABS: 20.0000 mg | ORAL_TABLET | Freq: Every day | ORAL | 1 refills | Status: DC | PRN

## 2024-09-24 NOTE — Assessment & Plan Note (Signed)

## 2024-09-24 NOTE — Assessment & Plan Note (Signed)
 His urinary symptoms are likely due to BPH Improved with Flomax 0.4 mg nightly

## 2024-09-24 NOTE — Patient Instructions (Signed)
 Please start taking Ozempic 0.25 mg once weekly.  Please take Glipizide  10 mg in the morning and 5 mg in the evening.  Please continue to take medications as prescribed.  Please continue to follow low carb diet and perform moderate exercise/walking at least 150 mins/week.  Please get fasting blood tests done before the next visit.

## 2024-09-24 NOTE — Progress Notes (Signed)
  Subjective:  Patient ID: David Knox, male    DOB: [birth year],   MRN: [id number]  Chief Complaint  Patient presents with   Diabetes    I have Neuropathy.  I have a knot on the bottom of my foot and it's getting bigger.  It's the left foot.  Saw Dr. Tobie - today; A1c - 10.2    58 y.o. male presents for for diabetic foot exam and follow-up of neuropathy.  He also relates a knot on the bottom of his foot that has been getting bigger on his left foot.  He is diabetic.  He relates burning tingling and numbness in his feet he is currently taking gabapentin .  His last A1c was 10.2. Denies any other pedal complaints. Denies n/v/f/c.   Past Medical History:  Diagnosis Date   Acid reflux    Diabetes mellitus without complication (HCC)    HLD (hyperlipidemia)    HTN (hypertension)     Objective:  Physical Exam: Vascular: DP/PT pulses 2/4 bilateral. CFT <3 seconds. Feet cold to touch. Absent hair growth on digits. Edema noted to bilateral lower extremities. Xerosis noted bilaterally.  Skin. No lacerations or abrasions bilateral feet. Nails 1-5 bilateral are normal in appearance. Soft tissue mass noted to plantar left midfoot.  Mass is firm and about 2 cm in diameter.  Mobile with the plantar fascia.  Nontender currently Musculoskeletal: MMT 5/5 bilateral lower extremities in DF, PF, Inversion and Eversion. Deceased ROM in DF of ankle joint.  Neurological: Sensation intact to light touch. Protective sensation diminished bilateral.    Assessment:   1. Plantar fascial fibromatosis   2. Type 2 diabetes mellitus with diabetic neuropathy, without long-term current use of insulin  (HCC)      Plan:  Patient was evaluated and treated and all questions answered. -Discussed and educated patient on diabetic foot care, especially with  regards to the vascular, neurological and musculoskeletal systems.  -Stressed the importance of good glycemic control and the detriment of not  controlling  glucose levels in relation to the foot. -Discussed supportive shoes at all times and checking feet regularly.   -Answered all patient questions X-rays reviewed and discussed with patient.  No acute fractures or dislocations noted.  No osseous immitis noted Discussed the diagnosis of fibroma and treatment options with patient. Discussed offloading of the area and proper shoe wear. Discussed steroid injection into the area in the future if needed. Did discuss surgical options and advised patient that fibroma resection is not 100% guaranteed. Patient to follow-up in 1 year for diabetic foot check    Asberry Failing, DPM

## 2024-09-24 NOTE — Assessment & Plan Note (Signed)
Last vitamin D Lab Results  Component Value Date   VD25OH 16.2 (L) 08/13/2023   Advised to take Vitamin D 5000 IU QD

## 2024-09-24 NOTE — Assessment & Plan Note (Signed)
 Ordered PSA after discussing its limitations for prostate cancer screening, including false positive results leading to additional investigations.

## 2024-09-24 NOTE — Telephone Encounter (Signed)
 Message sent to patient

## 2024-09-24 NOTE — Assessment & Plan Note (Addendum)
 Lab Results  Component Value Date   HGBA1C 10.2 (H) 09/01/2024   Uncontrolled due to diet noncompliance, has improved diet recently, but takes soda and alcohol Did not tolerate metformin  Added Ozempic 0.25 mg QW, plan to increase dose as tolerated On glipizide  10 mg BID now -advised to take glipizide  10 mg QAM and 5 mg QPM once he starts Ozempic to avoid midnight hypoglycemia spells Advised to follow diabetic diet - needs to cut down sweets and alcohol intake On statin and ARB F/u CMP and lipid panel Diabetic eye exam: Advised to follow up with Ophthalmology for diabetic eye exam  On gabapentin  400 mg twice daily for neuropathy, had added Cymbalta  30 mg QD, increased dose to 60 mg QD due to persistent symptoms If persistent, will refer to Neurology

## 2024-09-24 NOTE — Assessment & Plan Note (Signed)
 BP Readings from Last 1 Encounters:  09/24/24 118/80   Well-controlled with olmesartan -amlodipine -hydrochlorothiazide  20-5-12.5 mg once daily, refilled He stopped metoprolol  25 mg BID due to ED Counseled for compliance with the medications Advised DASH diet and moderate exercise/walking, at least 150 mins/week

## 2024-09-24 NOTE — Telephone Encounter (Signed)
 Pharmacy Patient Advocate Encounter   Received notification from Physician's Office that prior authorization for Ozempic  (0.25 or 0.5 MG/DOSE) 2MG /3ML pen-injectors is required/requested.   Insurance verification completed.   The patient is insured through [facility] MEDICAID.   Per test claim: PA required; PA submitted to above mentioned insurance via Latent Key/confirmation #/EOC [id number] Status is pending

## 2024-09-24 NOTE — Assessment & Plan Note (Signed)
 Has stopped metoprolol  now, but still has difficulty maintaining erection despite taking Cialis  10 mg Prescribed Cialis  20 mg as needed

## 2024-09-24 NOTE — Assessment & Plan Note (Signed)
Checked lipid profile On Atorvastatin 20 mg QD

## 2024-09-24 NOTE — Progress Notes (Signed)
 Established Patient Office Visit  Subjective:  Patient ID: David Knox, male    DOB: January 07, 1966  Age: 58 y.o. MRN: [id number]  CC:  Chief Complaint  Patient presents with   Annual Exam    HPI David Knox is a 58 y.o. male with past medical history of HTN, type 2 DM, peripheral neuropathy, GERD, hip arthritis and tobacco abuse who presents for f/u of his chronic medical conditions.  HTN: BP is wnl today, and has been wnl at home.  He was given olmesartan -amlodipine -HCTZ 20-5-12.5 mg QD, which is controlling his blood pressure well. He had cardiology evaluation for episodes of chest pain.  He was placed on metoprolol  25 mg BID by cardiologist initially, but was stopped due to erectile dysfunction.  He denies any recent episodes of chest pain.  Patient denies headache, dizziness, or palpitations.  Type II DM: His HbA1c had worsened to 10.2 now from 9.7 in 07/25.  He takes glipizide  10 mg twice daily now.  He did not tolerate metformin .  He admits that he needs to cut down soft drinks intake.  He denies any polyuria or polyphagia currently.  He admits that he needs to improve diet.  He also takes about 4 alcoholic drinks almost every other day.  He has bilateral feet burning pain, but has improved with gabapentin  400 mg BID and Cymbalta  60 mg QD now. He had US  ABI screening, which showed - Normal bilateral resting ankle-brachial indices. He denies any claudication symptoms.  He had urinary frequency and nocturia.  Denies any dysuria or hematuria. Has noticed postvoid dribbling at times, but has felt better with Flomax  now. He still reports erectile dysfunction, has difficulty maintaining erection.  He has tried Cialis  10 mg.  Past Medical History:  Diagnosis Date   Acid reflux    Diabetes mellitus without complication (HCC)    HLD (hyperlipidemia)    HTN (hypertension)     Past Surgical History:  Procedure Laterality Date   BIOPSY  01/18/2023   Procedure: BIOPSY;  Surgeon:  Cindie Carlin POUR, DO;  [facility]: [facility];  Service: Endoscopy;;   CHOLECYSTECTOMY N/A 07/23/2023   Procedure: LAPAROSCOPIC CHOLECYSTECTOMY;  Surgeon: Kallie Manuelita BROCKS, MD;  Location: [facility];  Service: General;  Laterality: N/A;   COLONOSCOPY WITH PROPOFOL  N/A 07/27/2022   Procedure: COLONOSCOPY WITH PROPOFOL ;  Surgeon: Cindie Carlin POUR, DO;  Location: [facility];  Service: Endoscopy;  Laterality: N/A;  9:00 am  ASA 2   ESOPHAGOGASTRODUODENOSCOPY  2002   RMR: ulcerative reflux esophagitis, moderate sized hiatal hernia   ESOPHAGOGASTRODUODENOSCOPY (EGD) WITH PROPOFOL  N/A 01/18/2023   Procedure: ESOPHAGOGASTRODUODENOSCOPY (EGD) WITH PROPOFOL ;  Surgeon: Cindie Carlin POUR, DO;  Location: [facility];  Service: Endoscopy;  Laterality: N/A;  1:45 pm, asa 2   HIP SURGERY Bilateral    as a child   POLYPECTOMY  07/27/2022   Procedure: POLYPECTOMY;  Surgeon: Cindie Carlin POUR, DO;  Location: [facility];  Service: Endoscopy;;   TOOTH EXTRACTION     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   Procedure: TOTAL HIP ARTHROPLASTY;  Surgeon: Margrette Taft BRAVO, MD;  Location: [facility];  Service: Orthopedics;  Laterality: Right;   WRIST SURGERY      Family History  Problem Relation Age of Onset   Diabetes Mother    Heart disease Mother    Diabetes Father    Heart disease Father    Colon cancer Neg Hx     Social History   Socioeconomic  History   Marital status: Single    Spouse name: Not on file   Number of children: Not on file   Years of education: Not on file   Highest education level: Not on file  Occupational History   Not on file  Tobacco Use   Smoking status: Every Day    Current packs/day: 0.50    Types: Cigarettes   Smokeless tobacco: Never   Tobacco comments:    5 ciggs per day - 05/07/2023  Vaping Use   Vaping status: Never Used  Substance and Sexual Activity   Alcohol use: Yes    Comment: beer and liquor twice weekly   Drug use: No    Comment: crack former-  last about 3 years ago.   Sexual activity: Not on file  Other Topics Concern   Not on file  Social History Narrative   Not on file   Social Drivers of Health   Financial Resource Strain: Not on file  Food Insecurity: No Food Insecurity (07/22/2023)   Hunger Vital Sign    Worried About Running Out of Food in the Last Year: Never true    Ran Out of Food in the Last Year: Never true  Transportation Needs: No Transportation Needs (07/22/2023)   PRAPARE - Administrator, Civil Service (Medical): No    Lack of Transportation (Non-Medical): No  Physical Activity: Not on file  Stress: Not on file  Social Connections: Not on file  Intimate Partner Violence: Not At Risk (07/22/2023)   Humiliation, Afraid, Rape, and Kick questionnaire    Fear of Current or Ex-Partner: No    Emotionally Abused: No    Physically Abused: No    Sexually Abused: No    Outpatient Medications Prior to Visit  Medication Sig Dispense Refill   albuterol  (VENTOLIN  HFA) 108 (90 Base) MCG/ACT inhaler Inhale 2 puffs into the lungs every 6 (six) hours as needed for wheezing or shortness of breath. 8 g 3   atorvastatin  (LIPITOR) 20 MG tablet TAKE 1 TABLET BY MOUTH EVERY DAY 90 tablet 1   blood glucose meter kit and supplies Dispense based on patient and insurance preference. Use up to four times daily as directed. (FOR ICD-10 E10.9, E11.9). 1 each 0   cholestyramine  (QUESTRAN ) 4 GM/DOSE powder Take 0.5 packets (2 g total) by mouth daily. 348.6 g 1   cyclobenzaprine  (FLEXERIL ) 5 MG tablet Take 1 tablet (5 mg total) by mouth 2 (two) times daily as needed. 30 tablet 1   DULoxetine  (CYMBALTA ) 60 MG capsule TAKE 1 CAPSULE BY MOUTH EVERY DAY 90 capsule 2   gabapentin  (NEURONTIN ) 400 MG capsule TAKE 1 CAPSULE BY MOUTH 2 TIMES DAILY. 60 capsule 5   glipiZIDE  (GLUCOTROL ) 10 MG tablet TAKE 1 TABLET (10 MG TOTAL) BY MOUTH TWICE A DAY BEFORE A MEAL 180 tablet 1   ibuprofen  (ADVIL ) 200 MG tablet Take 400 mg by mouth 2 (two)  times daily as needed for headache or moderate pain.     latanoprost (XALATAN) 0.005 % ophthalmic solution Place 1 drop into both eyes at bedtime.     metoprolol  tartrate (LOPRESSOR ) 25 MG tablet TAKE 1 TABLET BY MOUTH (2) TIMES A DAY. 180 tablet 2   nitroGLYCERIN  (NITROSTAT ) 0.4 MG SL tablet Place 1 tablet (0.4 mg total) under the tongue every 5 (five) minutes as needed for chest pain. 10 tablet 1   Olmesartan -amLODIPine -HCTZ 20-5-12.5 MG TABS TAKE 1 TABLET BY MOUTH EVERY DAY 90 tablet 1  pantoprazole  (PROTONIX ) 40 MG tablet TAKE 1 TABLET BY MOUTH TWICE A DAY 60 tablet 11   tamsulosin  (FLOMAX ) 0.4 MG CAPS capsule TAKE 1 CAPSULE BY MOUTH EVERY DAY 90 capsule 1   tadalafil  (CIALIS ) 10 MG tablet TAKE 1 TABLET BY MOUTH DAILY AS NEEDED FOR ERECTILE DYSFUNCTION 30 tablet 1   No facility-administered medications prior to visit.    Allergies  Allergen Reactions   Asa [Aspirin ] Nausea Only    ROS Review of Systems  Constitutional:  Negative for chills and fever.  HENT:  Negative for congestion and sore throat.   Eyes:  Positive for visual disturbance. Negative for pain and discharge.  Respiratory:  Negative for cough and shortness of breath.   Cardiovascular:  Negative for chest pain and palpitations.  Gastrointestinal:  Negative for nausea and vomiting.  Endocrine: Negative for polydipsia and polyuria.  Genitourinary:  Negative for dysuria and hematuria.  Musculoskeletal:  Positive for arthralgias and back pain. Negative for neck pain and neck stiffness.  Skin:  Negative for rash.  Neurological:  Positive for numbness (In left leg and feet). Negative for dizziness, weakness and headaches.  Psychiatric/Behavioral:  Negative for agitation and behavioral problems.       Objective:    Physical Exam Vitals reviewed.  Constitutional:      General: He is not in acute distress.    Appearance: He is not diaphoretic.  HENT:     Head: Normocephalic and atraumatic.     Nose: Nose normal.      Mouth/Throat:     Mouth: Mucous membranes are moist.  Eyes:     General: No scleral icterus.    Extraocular Movements: Extraocular movements intact.  Cardiovascular:     Rate and Rhythm: Normal rate and regular rhythm.     Heart sounds: Normal heart sounds. No murmur heard. Pulmonary:     Breath sounds: Normal breath sounds. No wheezing or rales.  Abdominal:     Palpations: Abdomen is soft.     Tenderness: There is no abdominal tenderness.  Musculoskeletal:     Cervical back: Neck supple. No tenderness.     Right lower leg: No edema.     Left lower leg: No edema.  Skin:    General: Skin is warm.     Findings: No rash.  Neurological:     General: No focal deficit present.     Mental Status: He is alert and oriented to person, place, and time.     Cranial Nerves: No cranial nerve deficit.     Motor: No weakness.  Psychiatric:        Mood and Affect: Mood normal.        Behavior: Behavior normal.     BP 118/80   Pulse 96   Ht 5' 11 (1.803 m)   Wt 192 lb (87.1 kg)   SpO2 98%   BMI 26.78 kg/m  Wt Readings from Last 3 Encounters:  09/24/24 192 lb (87.1 kg)  08/28/24 195 lb (88.5 kg)  07/23/24 195 lb 3.2 oz (88.5 kg)    Lab Results  Component Value Date   TSH 0.575 09/01/2024   Lab Results  Component Value Date   WBC 7.7 08/13/2023   HGB 13.2 08/13/2023   HCT 41.5 08/13/2023   MCV 95 08/13/2023   PLT 309 08/13/2023   Lab Results  Component Value Date   NA 137 09/01/2024   K 4.0 09/01/2024   CO2 21 09/01/2024   GLUCOSE 207 (H) 09/01/2024  BUN 16 09/01/2024   CREATININE 1.17 09/01/2024   BILITOT 0.3 09/01/2024   ALKPHOS 84 09/01/2024   AST 23 09/01/2024   ALT 38 09/01/2024   PROT 6.8 09/01/2024   ALBUMIN 4.3 09/01/2024   CALCIUM  9.2 09/01/2024   ANIONGAP 8 09/11/2023   EGFR 73 09/01/2024   Lab Results  Component Value Date   CHOL 103 08/13/2023   Lab Results  Component Value Date   HDL 27 (L) 08/13/2023   Lab Results  Component Value Date    LDLCALC 27 08/13/2023   Lab Results  Component Value Date   TRIG 335 (H) 08/13/2023   Lab Results  Component Value Date   CHOLHDL 3.8 08/13/2023   Lab Results  Component Value Date   HGBA1C 10.2 (H) 09/01/2024      Assessment & Plan:   Problem List Items Addressed This Visit       Cardiovascular and Mediastinum   Essential hypertension   BP Readings from Last 1 Encounters:  09/24/24 118/80   Well-controlled with olmesartan -amlodipine -hydrochlorothiazide  20-5-12.5 mg once daily, refilled He stopped metoprolol  25 mg BID due to ED Counseled for compliance with the medications Advised DASH diet and moderate exercise/walking, at least 150 mins/week      Relevant Medications   tadalafil  (CIALIS ) 20 MG tablet   Other Relevant Orders   TSH   CMP14+EGFR   CBC with Differential/Platelet     Endocrine   Type 2 diabetes mellitus with diabetic neuropathy, unspecified (HCC)   Lab Results  Component Value Date   HGBA1C 10.2 (H) 09/01/2024   Uncontrolled due to diet noncompliance, has improved diet recently, but takes soda and alcohol Did not tolerate metformin  Added Ozempic  0.25 mg QW, plan to increase dose as tolerated On glipizide  10 mg BID now -advised to take glipizide  10 mg QAM and 5 mg QPM once he starts Ozempic  to avoid midnight hypoglycemia spells Advised to follow diabetic diet - needs to cut down sweets and alcohol intake On statin and ARB F/u CMP and lipid panel Diabetic eye exam: Advised to follow up with Ophthalmology for diabetic eye exam  On gabapentin  400 mg twice daily for neuropathy, had added Cymbalta  30 mg QD, increased dose to 60 mg QD due to persistent symptoms If persistent, will refer to Neurology      Relevant Medications   Semaglutide ,0.25 or 0.5MG /DOS, (OZEMPIC , 0.25 OR 0.5 MG/DOSE,) 2 MG/3ML SOPN   Other Relevant Orders   Urine Microalbumin w/creat. ratio   Hemoglobin A1c   CMP14+EGFR     Other   Mixed hyperlipidemia (Chronic)    Checked lipid profile On Atorvastatin  20 mg QD      Relevant Medications   tadalafil  (CIALIS ) 20 MG tablet   Other Relevant Orders   Lipid panel   Vitamin D  deficiency   Last vitamin D  Lab Results  Component Value Date   VD25OH 16.2 (L) 08/13/2023   Advised to take Vitamin D  5000 IU QD      Relevant Orders   VITAMIN D  25 Hydroxy (Vit-D Deficiency, Fractures)   Encounter for general adult medical examination with abnormal findings - Primary   Physical exam as documented. Counseling done  re healthy lifestyle involving commitment to 150 minutes exercise per week, heart healthy diet, and attaining healthy weight.The importance of adequate sleep also discussed. Changes in health habits are decided on by the patient with goals and time frames  set for achieving them. Immunization and cancer screening needs are specifically addressed at this  visit.      Prostate cancer screening   Ordered PSA after discussing its limitations for prostate cancer screening, including false positive results leading to additional investigations.      Relevant Orders   PSA   Benign prostatic hyperplasia with urinary frequency   His urinary symptoms are likely due to BPH Improved with Flomax  0.4 mg nightly      Erectile dysfunction   Has stopped metoprolol  now, but still has difficulty maintaining erection despite taking Cialis  10 mg Prescribed Cialis  20 mg as needed      Relevant Medications   tadalafil  (CIALIS ) 20 MG tablet   Other Visit Diagnoses       Nausea       Relevant Medications   ondansetron  (ZOFRAN ) 4 MG tablet        Meds ordered this encounter  Medications   Semaglutide,0.25 or 0.5MG /DOS, (OZEMPIC, 0.25 OR 0.5 MG/DOSE,) 2 MG/3ML SOPN    Sig: Inject 0.25 mg into the skin every 7 (seven) days.    Dispense:  3 mL    Refill:  0   tadalafil  (CIALIS ) 20 MG tablet    Sig: Take 1 tablet (20 mg total) by mouth daily as needed for erectile dysfunction.    Dispense:  30 tablet     Refill:  1    DX Code Needed  .   ondansetron  (ZOFRAN ) 4 MG tablet    Sig: Take 1 tablet (4 mg total) by mouth every 8 (eight) hours as needed for nausea or vomiting.    Dispense:  20 tablet    Refill:  0    Follow-up: Return in about 6 weeks (around 11/05/2024) for DM and HTN.    Suzzane MARLA Blanch, MD

## 2024-09-24 NOTE — Telephone Encounter (Signed)
 Pharmacy Patient Advocate Encounter  Received notification from [facility] MEDICAID that Prior Authorization for Ozempic  (0.25 or 0.5 MG/DOSE) 2MG /3ML pen-injectors has been APPROVED from 09/24/2024 to 09/24/2025.   PA #/Case ID/Reference #: [id number]  Ran test claim and [facility] [place] Medicaid shows patient has other primary insurance coverage. If the member feels this is incorrect they will need to contact [facility] to update the coordination of benefits to reflect no other insurance before the pharmacy can process it successfully through the insurance. [phone]

## 2024-09-26 LAB — MICROALBUMIN / CREATININE URINE RATIO
Creatinine, Urine: 67.7 mg/dL
Microalb/Creat Ratio: 13 mg/g{creat} (ref 0–29)
Microalbumin, Urine: 8.5 ug/mL

## 2024-10-13 ENCOUNTER — Emergency Department (HOSPITAL_COMMUNITY)
Admission: EM | Admit: 2024-10-13 | Discharge: 2024-10-13 | Disposition: A | Discharge status: Home / Self Care | Attending: Emergency Medicine | Admitting: Emergency Medicine

## 2024-10-13 ENCOUNTER — Encounter (HOSPITAL_COMMUNITY): Payer: Self-pay

## 2024-10-13 ENCOUNTER — Other Ambulatory Visit: Payer: Self-pay

## 2024-10-13 DIAGNOSIS — R739 Hyperglycemia, unspecified: Secondary | ICD-10-CM

## 2024-10-13 LAB — URINALYSIS, ROUTINE W REFLEX MICROSCOPIC
Bacteria, UA: NONE SEEN
Bilirubin Urine: NEGATIVE
Glucose, UA: >=500 mg/dL — AB
Hgb urine dipstick: NEGATIVE
Ketones, ur: NEGATIVE mg/dL
Leukocytes,Ua: NEGATIVE
Nitrite: NEGATIVE
Protein, ur: 30 mg/dL — AB
Specific Gravity, Urine: 1.01 (ref 1.005–1.030)
pH: 5 (ref 5.0–8.0)

## 2024-10-13 LAB — CBC WITH DIFFERENTIAL/PLATELET
Abs Immature Granulocytes: 0.04 K/uL (ref 0.00–0.07)
Basophils Absolute: 0 K/uL (ref 0.0–0.1)
Basophils Relative: 0 %
Eosinophils Absolute: 0 K/uL (ref 0.0–0.5)
Eosinophils Relative: 0 %
HCT: 41 % (ref 39.0–52.0)
Hemoglobin: 13.9 g/dL (ref 13.0–17.0)
Immature Granulocytes: 1 %
Lymphocytes Relative: 29 %
Lymphs Abs: 2.2 K/uL (ref 0.7–4.0)
MCH: 31 pg (ref 26.0–34.0)
MCHC: 33.9 g/dL (ref 30.0–36.0)
MCV: 91.5 fL (ref 80.0–100.0)
Monocytes Absolute: 0.5 K/uL (ref 0.1–1.0)
Monocytes Relative: 6 %
Neutro Abs: 4.9 K/uL (ref 1.7–7.7)
Neutrophils Relative %: 64 %
Platelets: 308 K/uL (ref 150–400)
RBC: 4.48 MIL/uL (ref 4.22–5.81)
RDW: 12.8 % (ref 11.5–15.5)
WBC: 7.7 K/uL (ref 4.0–10.5)
nRBC: 0 % (ref 0.0–0.2)

## 2024-10-13 LAB — BASIC METABOLIC PANEL WITH GFR
Anion gap: 10 (ref 5–15)
BUN: 16 mg/dL (ref 6–20)
CO2: 30 mmol/L (ref 22–32)
Calcium: 9.7 mg/dL (ref 8.9–10.3)
Chloride: 101 mmol/L (ref 98–111)
Creatinine, Ser: 1.13 mg/dL (ref 0.61–1.24)
GFR, Estimated: >60 mL/min (ref >60)
Glucose, Bld: 162 mg/dL — ABNORMAL HIGH (ref 70–99)
Potassium: 4.6 mmol/L (ref 3.5–5.1)
Sodium: 140 mmol/L (ref 135–145)

## 2024-10-13 LAB — COMPREHENSIVE METABOLIC PANEL WITH GFR
ALT: 41 U/L (ref 0–44)
AST: 25 U/L (ref 15–41)
Albumin: 4.7 g/dL (ref 3.5–5.0)
Alkaline Phosphatase: 78 U/L (ref 38–126)
Anion gap: 18 — ABNORMAL HIGH (ref 5–15)
BUN: 17 mg/dL (ref 6–20)
CO2: 21 mmol/L — ABNORMAL LOW (ref 22–32)
Calcium: 9.6 mg/dL (ref 8.9–10.3)
Chloride: 95 mmol/L — ABNORMAL LOW (ref 98–111)
Creatinine, Ser: 1.51 mg/dL — ABNORMAL HIGH (ref 0.61–1.24)
GFR, Estimated: 54 mL/min — ABNORMAL LOW (ref >60)
Glucose, Bld: 326 mg/dL — ABNORMAL HIGH (ref 70–99)
Potassium: 3.9 mmol/L (ref 3.5–5.1)
Sodium: 133 mmol/L — ABNORMAL LOW (ref 135–145)
Total Bilirubin: 0.3 mg/dL (ref 0.0–1.2)
Total Protein: 7.7 g/dL (ref 6.5–8.1)

## 2024-10-13 LAB — BLOOD GAS, VENOUS
Acid-Base Excess: 4.8 mmol/L — ABNORMAL HIGH (ref 0.0–2.0)
Bicarbonate: 29.9 mmol/L — ABNORMAL HIGH (ref 20.0–28.0)
Drawn by: 52113
O2 Saturation: 63 %
Patient temperature: 36.8
pCO2, Ven: 45 mmHg (ref 44–60)
pH, Ven: 7.43 (ref 7.25–7.43)
pO2, Ven: 35 mmHg (ref 32–45)

## 2024-10-13 LAB — CBG MONITORING, ED
Glucose-Capillary: 329 mg/dL — ABNORMAL HIGH (ref 70–99)
Glucose-Capillary: 158 mg/dL — ABNORMAL HIGH (ref 70–99)

## 2024-10-13 MED ORDER — INSULIN ASPART 100 UNIT/ML IJ SOLN: 5.0000 [IU] | Freq: Once | INTRAMUSCULAR | Status: DC

## 2024-10-13 MED ORDER — LACTATED RINGERS IV BOLUS
1000.0000 mL | Freq: Once | INTRAVENOUS | Status: AC
  Administered 2024-10-13: 1000 mL via INTRAVENOUS

## 2024-10-13 NOTE — ED Notes (Signed)
 Discharge instructions reviewed.   Opportunity for questions and concerns provided.   Alert, oriented and ambulatory.   Displays no signs of distress.

## 2024-10-13 NOTE — Discharge Instructions (Signed)
 Seen today for high blood sugar.  This improved with IV fluids.  Avoid high sugar foods and limit your alcohol intake.  Follow-up closely with your primary care doctor.  Come back to the ER for new or worsening symptoms.

## 2024-10-13 NOTE — ED Triage Notes (Signed)
 Pt arrived via POV from home c/o hyperglycemia and reports a CBG of 583 at home apprx 60 minutes PTA. Pt reports starting Ozempic  and reports he takes Glipizide .

## 2024-10-13 NOTE — ED Triage Notes (Signed)
 Pt endorses excessive thirst and reports he took his 2nd dose of Glipizide  PTA as well. Pts CBG in Triage is 329.

## 2024-10-13 NOTE — ED Notes (Signed)
 Says he is beginning to feel better. Prior to arrival had polyuria, polydipsia, and blurred vision. After IVF says he starts to feel better.

## 2024-10-13 NOTE — ED Provider Notes (Signed)
  EMERGENCY DEPARTMENT AT [facility] Provider Note   CSN: [id number] Arrival date & time: 10/13/24  1650     Patient presents with: Hyperglycemia   David Knox is a 58 y.o. male.  History of pretension, peripheral neuropathy, GERD, anemia, alcohol abuse, COPD, type 2 diabetes on Ozempic  and glipizide .  Is ER today for high blood sugar.    He states he was making a cake, when he started feeling fatigued and like his vision got blurry, he was very thirsty all day today and has been peeing frequently.  He checked his blood sugar and states it was 568.  Reports he has been compliant with his medicines, did have five 1 ounce shots of vodka today and he states he is what ever food he has in the house.  He states today so far he had eaten a grilled ham and cheese sandwich, but he often gets up in the middle of the night and eats all night.      Hyperglycemia      Prior to Admission medications   Medication Sig Start Date End Date Taking? Authorizing Provider  albuterol  (VENTOLIN  HFA) 108 (90 Base) MCG/ACT inhaler Inhale 2 puffs into the lungs every 6 (six) hours as needed for wheezing or shortness of breath. 12/05/22   Tobie Suzzane POUR, MD  atorvastatin  (LIPITOR) 20 MG tablet TAKE 1 TABLET BY MOUTH EVERY DAY 06/23/24   Tobie Suzzane POUR, MD  blood glucose meter kit and supplies Dispense based on patient and insurance preference. Use up to four times daily as directed. (FOR ICD-10 E10.9, E11.9). 08/21/22   [name], MD  cholestyramine  (QUESTRAN ) 4 GM/DOSE powder Take 0.5 packets (2 g total) by mouth daily. 05/12/24   [name], NP  cyclobenzaprine  (FLEXERIL ) 5 MG tablet Take 1 tablet (5 mg total) by mouth 2 (two) times daily as needed. 02/18/24   Tobie Suzzane POUR, MD  DULoxetine  (CYMBALTA ) 60 MG capsule TAKE 1 CAPSULE BY MOUTH EVERY DAY 06/16/24   Patel, [name], MD  gabapentin  (NEURONTIN ) 400 MG capsule TAKE 1 CAPSULE BY MOUTH 2 TIMES DAILY. 03/21/24   Tobie Suzzane POUR, MD  glipiZIDE  (GLUCOTROL ) 10 MG tablet TAKE 1 TABLET (10 MG TOTAL) BY MOUTH TWICE A DAY BEFORE A MEAL 08/19/24   Tobie Suzzane POUR, MD  ibuprofen  (ADVIL ) 200 MG tablet Take 400 mg by mouth 2 (two) times daily as needed for headache or moderate pain.    [provider]  latanoprost (XALATAN) 0.005 % ophthalmic solution Place 1 drop into both eyes at bedtime.    [provider]  metoprolol  tartrate (LOPRESSOR ) 25 MG tablet TAKE 1 TABLET BY MOUTH (2) TIMES A DAY. 01/07/24   Mallipeddi, Vishnu P, MD  nitroGLYCERIN  (NITROSTAT ) 0.4 MG SL tablet Place 1 tablet (0.4 mg total) under the tongue every 5 (five) minutes as needed for chest pain. 12/05/22   Tobie Suzzane POUR, MD  Olmesartan -amLODIPine -HCTZ 20-5-12.5 MG TABS TAKE 1 TABLET BY MOUTH EVERY DAY 06/16/24   Patel, [name], MD  ondansetron  (ZOFRAN ) 4 MG tablet Take 1 tablet (4 mg total) by mouth every 8 (eight) hours as needed for nausea or vomiting. 09/24/24   Tobie Suzzane POUR, MD  pantoprazole  (PROTONIX ) 40 MG tablet TAKE 1 TABLET BY MOUTH TWICE A DAY 01/14/24   [name], NP  Semaglutide ,0.25 or 0.5MG /DOS, (OZEMPIC , 0.25 OR 0.5 MG/DOSE,) 2 MG/3ML SOPN Inject 0.25 mg into the skin every 7 (seven) days. 09/24/24  Tobie Suzzane POUR, MD  tadalafil  (CIALIS ) 20 MG tablet Take 1 tablet (20 mg total) by mouth daily as needed for erectile dysfunction. 09/24/24   Tobie Suzzane POUR, MD  tamsulosin  (FLOMAX ) 0.4 MG CAPS capsule TAKE 1 CAPSULE BY MOUTH EVERY DAY 05/05/24   Tobie Suzzane POUR, MD    Allergies: Dorethia Carolus ]    Review of Systems  Updated Vital Signs BP 121/84   Pulse 86   Temp 98.2 F (36.8 C) (Oral)   Resp 18   Ht 5' 11 (1.803 m)   Wt 88 kg   SpO2 98%   BMI 27.06 kg/m   Physical Exam Vitals and nursing note reviewed.  Constitutional:      General: He is not in acute distress.    Appearance: He is well-developed.  HENT:     Head: Normocephalic and atraumatic.  Eyes:     Conjunctiva/sclera: Conjunctivae normal.   Cardiovascular:     Rate and Rhythm: Normal rate and regular rhythm.     Heart sounds: No murmur heard. Pulmonary:     Effort: Pulmonary effort is normal. No respiratory distress.     Breath sounds: Normal breath sounds.  Abdominal:     Palpations: Abdomen is soft.     Tenderness: There is no abdominal tenderness.  Musculoskeletal:        General: No swelling.     Cervical back: Neck supple.     Right lower leg: No edema.     Left lower leg: No edema.  Skin:    General: Skin is warm and dry.     Capillary Refill: Capillary refill takes less than 2 seconds.  Neurological:     General: No focal deficit present.     Mental Status: He is alert and oriented to person, place, and time.  Psychiatric:        Mood and Affect: Mood normal.     (all labs ordered are listed, but only abnormal results are displayed) Labs Reviewed  URINALYSIS, ROUTINE W REFLEX MICROSCOPIC - Abnormal; Notable for the following components:      Result Value   Glucose, UA >=500 (*)    Protein, ur 30 (*)    All other components within normal limits  COMPREHENSIVE METABOLIC PANEL WITH GFR - Abnormal; Notable for the following components:   Sodium 133 (*)    Chloride 95 (*)    CO2 21 (*)    Glucose, Bld 326 (*)    Creatinine, Ser 1.51 (*)    GFR, Estimated 54 (*)    Anion gap 18 (*)    All other components within normal limits  BLOOD GAS, VENOUS - Abnormal; Notable for the following components:   Bicarbonate 29.9 (*)    Acid-Base Excess 4.8 (*)    All other components within normal limits  BASIC METABOLIC PANEL WITH GFR - Abnormal; Notable for the following components:   Glucose, Bld 162 (*)    All other components within normal limits  CBG MONITORING, ED - Abnormal; Notable for the following components:   Glucose-Capillary 329 (*)    All other components within normal limits  CBG MONITORING, ED - Abnormal; Notable for the following components:   Glucose-Capillary 158 (*)    All other components  within normal limits  CBC WITH DIFFERENTIAL/PLATELET    EKG: None  Radiology: No results found.   Procedures   Medications Ordered in the ED  lactated ringers  bolus 1,000 mL (1,000 mLs Intravenous Bolus 10/13/24 1753)  lactated  ringers  bolus 1,000 mL (0 mLs Intravenous Stopped 10/13/24 2054)                                    Medical Decision Making Differential diagnosis includes but not limited to hyperglycemia, HHS, DKA, dehydration, other  ED course: Presents today for high blood sugar and associated symptoms.  Patient was seen and urinating frequently and also felt fatigued.  Abdominal status is normal, he has no neurologic deficits on my exam.  He states he took his evening dose of glipizide  this prior to arrival.  Blood sugar here was initially 326 on his CMP.  CO2 of 21 and increased anion gap at 18, his pH was normal on his VBG.  He did not have a UTI or ketones in his urine.  CBC was normal.  He was given 2 L of IV fluids.  His blood sugar was at 70 improved on repeat BMP and his anion gap was normal.  His creatinine improved back to baseline from 1.51-1.13.  I discussed with patient it was reassuring and his labs have normalized, likely due to accommodation of his glipizide  and the IV fluids.  He does report he had 5 shots of vodka today, so some alcoholic ketosis could have contributed to initial increased anion gap.  He also reports noncompliance with his diabetic diet discussed importance of decreasing high hepatic fluids and decreasing alcohol intake.  Today patient states he drank alcohol in the past 4 to 5 days, he is trying to gradually quit alcohol, and does get his glucose under control so he can have surgery on his hip.  He can follow-up closely with primary care doctor.  Advised on strict return precautions.  Amount and/or Complexity of Data Reviewed External Data Reviewed: labs, radiology and notes. Labs: ordered.        Final diagnoses:  Hyperglycemia     ED Discharge Orders     None          Suellen Sherran LABOR, PA-C 10/13/24 2253

## 2024-10-14 ENCOUNTER — Ambulatory Visit: Payer: Self-pay

## 2024-10-14 NOTE — Telephone Encounter (Signed)
 FYI Only or Action Required?: Action required by provider: request for appointment.  Patient was last seen in primary care on 09/24/2024 by Tobie Suzzane POUR, MD.  Called Nurse Triage reporting Hyperglycemia.  Symptoms began today.  Interventions attempted: Nothing.  Symptoms are: unchanged.  Triage Disposition: Home Care  Patient/caregiver understands and will follow disposition?: Yes, will follow disposition  Copied from CRM #[id number]. Topic: Clinical - Red Word Triage >> Oct 14, 2024  3:17 PM Darshell M wrote: Red Word that prompted transfer to Nurse Triage: Patient blood sugar is 336. Was in ER last night with blood sugar of 563. Got it down to 158 last night. Patient says he is managing diet. Wants to schedule appointment with Dr. Tobie. Reason for Disposition  [1] Blood glucose > 300 mg/dL (83.2 mmol/L) AND [7] does not use insulin  (e.g., not insulin -dependent; most people with type 2 diabetes)  Answer Assessment - Initial Assessment Questions 1. BLOOD GLUCOSE: What is your blood glucose level?      336 3. USUAL RANGE: What is your glucose level usually? (e.g., usual fasting morning value, usual evening value)     Unsure states he does not check it normally at this time of day 4. KETONES: Do you check for ketones (urine or blood test strips)? If Yes, ask: What does the test show now?      Denies checking it  5. TYPE 1 or 2:  Do you know what type of diabetes you have?  (e.g., Type 1, Type 2, Gestational; doesn't know)      Type 2 6. INSULIN : Do you take insulin ? What type of insulin (s) do you use? What is the mode of delivery? (syringe, pen; injection or pump)?      Denies using insulin  7. DIABETES PILLS: Do you take any pills for your diabetes? If Yes, ask: Have you missed taking any pills recently?     States has been taking as prescribed 8. OTHER SYMPTOMS: Do you have any symptoms? (e.g., fever, frequent urination, difficulty breathing, dizziness, weakness,  vomiting)     Frequent urination, clammy,  Protocols used: Diabetes - High Blood Sugar-A-AH

## 2024-10-14 NOTE — Telephone Encounter (Signed)
 Scheduled tomorrow

## 2024-10-15 ENCOUNTER — Ambulatory Visit: Admitting: Internal Medicine

## 2024-10-15 ENCOUNTER — Encounter: Payer: Self-pay | Admitting: Internal Medicine

## 2024-10-15 VITALS — BP 119/73 | HR 80 | Ht 71.0 in | Wt 194.6 lb

## 2024-10-15 DIAGNOSIS — Z09 Encounter for follow-up examination after completed treatment for conditions other than malignant neoplasm: Secondary | ICD-10-CM | POA: Insufficient documentation

## 2024-10-15 DIAGNOSIS — E1140 Type 2 diabetes mellitus with diabetic neuropathy, unspecified: Secondary | ICD-10-CM

## 2024-10-15 DIAGNOSIS — E1165 Type 2 diabetes mellitus with hyperglycemia: Secondary | ICD-10-CM

## 2024-10-15 DIAGNOSIS — F5101 Primary insomnia: Secondary | ICD-10-CM | POA: Insufficient documentation

## 2024-10-15 DIAGNOSIS — Z7984 Long term (current) use of oral hypoglycemic drugs: Secondary | ICD-10-CM

## 2024-10-15 DIAGNOSIS — Z7985 Long-term (current) use of injectable non-insulin antidiabetic drugs: Secondary | ICD-10-CM

## 2024-10-15 MED ORDER — OZEMPIC (0.25 OR 0.5 MG/DOSE) 2 MG/3ML ~~LOC~~ SOPN: 0.5000 mg | PEN_INJECTOR | SUBCUTANEOUS | 1 refills | Status: DC

## 2024-10-15 MED ORDER — HYDROXYZINE PAMOATE 25 MG PO CAPS: 25.0000 mg | ORAL_CAPSULE | Freq: Every evening | ORAL | 2 refills | Status: DC | PRN

## 2024-10-15 NOTE — Patient Instructions (Addendum)
 Please start taking Glipizide  10 mg twice a day.  Please start taking Ozempic  0.5 mg once weekly.  Please start taking Hydroxyzine as needed for insomnia.  Please maintain simple sleep hygiene. - Maintain dark and non-noisy environment in the bedroom. - Please use the bedroom for sleep and sexual activity only. - Do not use electronic devices in the bedroom. - Please take dinner at least 2 hours before bedtime. - Please avoid caffeinated products in the evening, including coffee, soft drinks. - Please try to maintain the regular sleep-wake cycle - Go to bed and wake up at the same time.

## 2024-10-15 NOTE — Assessment & Plan Note (Signed)
 Sleep hygiene discussed, material provided Hydroxyzine as needed for insomnia

## 2024-10-15 NOTE — Assessment & Plan Note (Signed)
 ER chart reviewed, including blood tests Episodes of hyperglycemia due to diet noncompliance - adjusted diabetic regimen today

## 2024-10-15 NOTE — Assessment & Plan Note (Addendum)
 Lab Results  Component Value Date   HGBA1C 10.2 (H) 09/01/2024   Uncontrolled due to diet noncompliance, has improved diet recently, but takes soda and alcohol Did not tolerate metformin  Increased dose of Ozempic  to 0.5 mg QW, plan to increase dose as tolerated Advised to take glipizide  10 mg BID now Advised to take 2 glasses of water  if blood glucose more than 250 and take about 10 minutes walk Advised to follow diabetic diet - needs to cut down sweets and alcohol intake On statin and ARB F/u CMP and lipid panel Diabetic eye exam: Advised to follow up with Ophthalmology for diabetic eye exam  On gabapentin  400 mg twice daily for neuropathy, had added Cymbalta  30 mg QD, increased dose to 60 mg QD due to persistent symptoms If persistent, will refer to Neurology

## 2024-10-15 NOTE — Progress Notes (Signed)
 Established Patient Office Visit  Subjective:  Patient ID: David Knox, male    DOB: 08-09-1966  Age: 58 y.o. MRN: [id number]  CC:  Chief Complaint  Patient presents with   Hyperglycemia    ER f/u.     HPI David Knox is a 58 y.o. male with past medical history of HTN, type 2 DM, peripheral neuropathy, GERD, hip arthritis and tobacco abuse who presents for f/u of recent ER visit on 10/13/24.  He had episode of blurry vision and feeling fatigued on the day of ER visit.  He was also feeling very thirsty that day.  He checked his blood glucose, which was >500. Reports he had been compliant with his medicines, did have five 1 ounce shots of vodka that day and he states he eats what ever food he has in the house.  He had eaten a grilled ham and cheese sandwich on that day, but he often gets up in the middle of the night and eats all night.   Type II DM: His HbA1c had worsened to 10.2 in 10/25 from 9.7 in 07/25.  He has been placed on Ozempic , which he started 2 weeks ago. He takes glipizide  10 mg QAM and 5 mg QHS now.  He did not tolerate metformin .  He admits that he needs to cut down soft drinks intake.  He denies any polyuria or polyphagia currently.  He admits that he needs to improve diet.  He also takes about 4 alcoholic drinks almost every other day, but has not had any drinks since ER visit.  Insomnia: He reports difficulty initiating sleep, and stays awake till 1 or 2 AM.  He starts taking snacks at nighttime due to it, which causes hyperglycemia.   Past Medical History:  Diagnosis Date   Acid reflux    Diabetes mellitus without complication (HCC)    HLD (hyperlipidemia)    HTN (hypertension)     Past Surgical History:  Procedure Laterality Date   BIOPSY  01/18/2023   Procedure: BIOPSY;  Surgeon: Cindie Carlin POUR, DO;  Location: [facility];  Service: Endoscopy;;   CHOLECYSTECTOMY N/A 07/23/2023   Procedure: LAPAROSCOPIC CHOLECYSTECTOMY;  Surgeon: Kallie Manuelita BROCKS, MD;  Location: [facility];  Service: General;  Laterality: N/A;   COLONOSCOPY WITH PROPOFOL  N/A 07/27/2022   Procedure: COLONOSCOPY WITH PROPOFOL ;  Surgeon: Cindie Carlin POUR, DO;  Location: [facility];  Service: Endoscopy;  Laterality: N/A;  9:00 am  ASA 2   ESOPHAGOGASTRODUODENOSCOPY  2002   RMR: ulcerative reflux esophagitis, moderate sized hiatal hernia   ESOPHAGOGASTRODUODENOSCOPY (EGD) WITH PROPOFOL  N/A 01/18/2023   Procedure: ESOPHAGOGASTRODUODENOSCOPY (EGD) WITH PROPOFOL ;  Surgeon: Cindie Carlin POUR, DO;  Location: [facility];  Service: Endoscopy;  Laterality: N/A;  1:45 pm, asa 2   HIP SURGERY Bilateral    as a child   POLYPECTOMY  07/27/2022   Procedure: POLYPECTOMY;  Surgeon: Cindie Carlin POUR, DO;  Location: [facility];  Service: Endoscopy;;   TOOTH EXTRACTION     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   Procedure: TOTAL HIP ARTHROPLASTY;  Surgeon: Margrette Taft BRAVO, MD;  Location: [facility];  Service: Orthopedics;  Laterality: Right;   WRIST SURGERY      Family History  Problem Relation Age of Onset   Diabetes Mother    Heart disease Mother    Diabetes Father    Heart disease Father    Colon cancer Neg Hx     Social History  Socioeconomic History   Marital status: Single    Spouse name: Not on file   Number of children: Not on file   Years of education: Not on file   Highest education level: Not on file  Occupational History   Not on file  Tobacco Use   Smoking status: Every Day    Current packs/day: 0.50    Types: Cigarettes   Smokeless tobacco: Never   Tobacco comments:    5 ciggs per day - 05/07/2023  Vaping Use   Vaping status: Never Used  Substance and Sexual Activity   Alcohol use: Yes    Comment: beer and liquor twice weekly   Drug use: No    Comment: crack former- last about 3 years ago.   Sexual activity: Not on file  Other Topics Concern   Not on file  Social History Narrative   Not on file   Social Drivers of Health   Financial  Resource Strain: Not on file  Food Insecurity: No Food Insecurity (07/22/2023)   Hunger Vital Sign    Worried About Running Out of Food in the Last Year: Never true    Ran Out of Food in the Last Year: Never true  Transportation Needs: No Transportation Needs (07/22/2023)   PRAPARE - Administrator, Civil Service (Medical): No    Lack of Transportation (Non-Medical): No  Physical Activity: Not on file  Stress: Not on file  Social Connections: Not on file  Intimate Partner Violence: Not At Risk (07/22/2023)   Humiliation, Afraid, Rape, and Kick questionnaire    Fear of Current or Ex-Partner: No    Emotionally Abused: No    Physically Abused: No    Sexually Abused: No    Outpatient Medications Prior to Visit  Medication Sig Dispense Refill   albuterol  (VENTOLIN  HFA) 108 (90 Base) MCG/ACT inhaler Inhale 2 puffs into the lungs every 6 (six) hours as needed for wheezing or shortness of breath. 8 g 3   atorvastatin  (LIPITOR) 20 MG tablet TAKE 1 TABLET BY MOUTH EVERY DAY 90 tablet 1   blood glucose meter kit and supplies Dispense based on patient and insurance preference. Use up to four times daily as directed. (FOR ICD-10 E10.9, E11.9). 1 each 0   cholestyramine  (QUESTRAN ) 4 GM/DOSE powder Take 0.5 packets (2 g total) by mouth daily. 348.6 g 1   cyclobenzaprine  (FLEXERIL ) 5 MG tablet Take 1 tablet (5 mg total) by mouth 2 (two) times daily as needed. 30 tablet 1   DULoxetine  (CYMBALTA ) 60 MG capsule TAKE 1 CAPSULE BY MOUTH EVERY DAY 90 capsule 2   gabapentin  (NEURONTIN ) 400 MG capsule TAKE 1 CAPSULE BY MOUTH 2 TIMES DAILY. 60 capsule 5   glipiZIDE  (GLUCOTROL ) 10 MG tablet TAKE 1 TABLET (10 MG TOTAL) BY MOUTH TWICE A DAY BEFORE A MEAL 180 tablet 1   ibuprofen  (ADVIL ) 200 MG tablet Take 400 mg by mouth 2 (two) times daily as needed for headache or moderate pain.     latanoprost (XALATAN) 0.005 % ophthalmic solution Place 1 drop into both eyes at bedtime.     metoprolol  tartrate  (LOPRESSOR ) 25 MG tablet TAKE 1 TABLET BY MOUTH (2) TIMES A DAY. 180 tablet 2   nitroGLYCERIN  (NITROSTAT ) 0.4 MG SL tablet Place 1 tablet (0.4 mg total) under the tongue every 5 (five) minutes as needed for chest pain. 10 tablet 1   Olmesartan -amLODIPine -HCTZ 20-5-12.5 MG TABS TAKE 1 TABLET BY MOUTH EVERY DAY 90 tablet 1  ondansetron  (ZOFRAN ) 4 MG tablet Take 1 tablet (4 mg total) by mouth every 8 (eight) hours as needed for nausea or vomiting. 20 tablet 0   pantoprazole  (PROTONIX ) 40 MG tablet TAKE 1 TABLET BY MOUTH TWICE A DAY 60 tablet 11   tadalafil  (CIALIS ) 20 MG tablet Take 1 tablet (20 mg total) by mouth daily as needed for erectile dysfunction. 30 tablet 1   tamsulosin  (FLOMAX ) 0.4 MG CAPS capsule TAKE 1 CAPSULE BY MOUTH EVERY DAY 90 capsule 1   Semaglutide ,0.25 or 0.5MG /DOS, (OZEMPIC , 0.25 OR 0.5 MG/DOSE,) 2 MG/3ML SOPN Inject 0.25 mg into the skin every 7 (seven) days. 3 mL 0   No facility-administered medications prior to visit.    Allergies  Allergen Reactions   Dorethia Manas ] Nausea Only    ROS Review of Systems  Constitutional:  Negative for chills and fever.  HENT:  Negative for congestion and sore throat.   Eyes:  Positive for visual disturbance. Negative for pain and discharge.  Respiratory:  Negative for cough and shortness of breath.   Cardiovascular:  Negative for chest pain and palpitations.  Gastrointestinal:  Negative for nausea and vomiting.  Endocrine: Negative for polydipsia and polyuria.  Genitourinary:  Negative for dysuria and hematuria.  Musculoskeletal:  Positive for arthralgias and back pain. Negative for neck pain and neck stiffness.  Skin:  Negative for rash.  Neurological:  Positive for numbness (In left leg and feet). Negative for dizziness, weakness and headaches.  Psychiatric/Behavioral:  Negative for agitation and behavioral problems.       Objective:    Physical Exam Vitals reviewed.  Constitutional:      General: He is not in acute  distress.    Appearance: He is not diaphoretic.  HENT:     Head: Normocephalic and atraumatic.     Nose: Nose normal.     Mouth/Throat:     Mouth: Mucous membranes are moist.  Eyes:     General: No scleral icterus.    Extraocular Movements: Extraocular movements intact.  Cardiovascular:     Rate and Rhythm: Normal rate and regular rhythm.     Heart sounds: Normal heart sounds. No murmur heard. Pulmonary:     Breath sounds: Normal breath sounds. No wheezing or rales.  Abdominal:     Palpations: Abdomen is soft.     Tenderness: There is no abdominal tenderness.  Musculoskeletal:     Cervical back: Neck supple. No tenderness.     Right lower leg: No edema.     Left lower leg: No edema.  Skin:    General: Skin is warm.     Findings: No rash.  Neurological:     General: No focal deficit present.     Mental Status: He is alert and oriented to person, place, and time.     Cranial Nerves: No cranial nerve deficit.     Motor: No weakness.  Psychiatric:        Mood and Affect: Mood normal.        Behavior: Behavior normal.     BP 119/73   Pulse 80   Ht 5' 11 (1.803 m)   Wt 194 lb 9.6 oz (88.3 kg)   SpO2 98%   BMI 27.14 kg/m  Wt Readings from Last 3 Encounters:  10/15/24 194 lb 9.6 oz (88.3 kg)  10/13/24 194 lb (88 kg)  09/24/24 192 lb (87.1 kg)    Lab Results  Component Value Date   TSH 0.575 09/01/2024   Lab  Results  Component Value Date   WBC 7.7 10/13/2024   HGB 13.9 10/13/2024   HCT 41.0 10/13/2024   MCV 91.5 10/13/2024   PLT 308 10/13/2024   Lab Results  Component Value Date   NA 140 10/13/2024   K 4.6 10/13/2024   CO2 30 10/13/2024   GLUCOSE 162 (H) 10/13/2024   BUN 16 10/13/2024   CREATININE 1.13 10/13/2024   BILITOT 0.3 10/13/2024   ALKPHOS 78 10/13/2024   AST 25 10/13/2024   ALT 41 10/13/2024   PROT 7.7 10/13/2024   ALBUMIN 4.7 10/13/2024   CALCIUM  9.7 10/13/2024   ANIONGAP 10 10/13/2024   EGFR 73 09/01/2024   Lab Results  Component  Value Date   CHOL 103 08/13/2023   Lab Results  Component Value Date   HDL 27 (L) 08/13/2023   Lab Results  Component Value Date   LDLCALC 27 08/13/2023   Lab Results  Component Value Date   TRIG 335 (H) 08/13/2023   Lab Results  Component Value Date   CHOLHDL 3.8 08/13/2023   Lab Results  Component Value Date   HGBA1C 10.2 (H) 09/01/2024      Assessment & Plan:   Problem List Items Addressed This Visit       Endocrine   Type 2 diabetes mellitus with hyperglycemia (HCC)   Lab Results  Component Value Date   HGBA1C 10.2 (H) 09/01/2024   Uncontrolled due to diet noncompliance, has improved diet recently, but takes soda and alcohol Did not tolerate metformin  Increased dose of Ozempic  to 0.5 mg QW, plan to increase dose as tolerated Advised to take glipizide  10 mg BID now Advised to take 2 glasses of water  if blood glucose more than 250 and take about 10 minutes walk Advised to follow diabetic diet - needs to cut down sweets and alcohol intake On statin and ARB F/u CMP and lipid panel Diabetic eye exam: Advised to follow up with Ophthalmology for diabetic eye exam  On gabapentin  400 mg twice daily for neuropathy, had added Cymbalta  30 mg QD, increased dose to 60 mg QD due to persistent symptoms If persistent, will refer to Neurology      Relevant Medications   Semaglutide ,0.25 or 0.5MG /DOS, (OZEMPIC , 0.25 OR 0.5 MG/DOSE,) 2 MG/3ML SOPN     Other   Encounter for examination following treatment at hospital - Primary   ER chart reviewed, including blood tests Episodes of hyperglycemia due to diet noncompliance - adjusted diabetic regimen today      Primary insomnia   Sleep hygiene discussed, material provided Hydroxyzine as needed for insomnia      Relevant Medications   hydrOXYzine (VISTARIL) 25 MG capsule     Meds ordered this encounter  Medications   Semaglutide ,0.25 or 0.5MG /DOS, (OZEMPIC , 0.25 OR 0.5 MG/DOSE,) 2 MG/3ML SOPN    Sig: Inject 0.5 mg  into the skin every 7 (seven) days.    Dispense:  3 mL    Refill:  1   hydrOXYzine (VISTARIL) 25 MG capsule    Sig: Take 1 capsule (25 mg total) by mouth at bedtime as needed for anxiety (Insomnia).    Dispense:  30 capsule    Refill:  2    Follow-up: Return if symptoms worsen or fail to improve.    Suzzane MARLA Blanch, MD

## 2024-10-17 ENCOUNTER — Other Ambulatory Visit: Payer: Self-pay | Admitting: Internal Medicine

## 2024-10-17 DIAGNOSIS — G629 Polyneuropathy, unspecified: Secondary | ICD-10-CM

## 2024-10-23 ENCOUNTER — Other Ambulatory Visit: Payer: Self-pay

## 2024-10-23 ENCOUNTER — Telehealth: Payer: Self-pay | Admitting: Internal Medicine

## 2024-10-23 NOTE — Telephone Encounter (Signed)
 Prescription Request  10/23/2024  LOV: 10/17/2024  What is the name of the medication or equipment? Needs test strip to his meter machine -accu check guide is what he uses.   Have you contacted your pharmacy to request a refill? No   Which pharmacy would you like this sent to?   [facility]    Patient notified that their request is being sent to the clinical staff for review and that they should receive a response within 2 business days.   Please advise at walked into office

## 2024-10-23 NOTE — Telephone Encounter (Signed)
[facility] 

## 2024-10-28 ENCOUNTER — Ambulatory Visit: Admitting: Gastroenterology

## 2024-10-31 ENCOUNTER — Telehealth: Payer: Self-pay

## 2024-10-31 NOTE — Telephone Encounter (Signed)
 Printed

## 2024-10-31 NOTE — Telephone Encounter (Signed)
 Copied from CRM #[id number]. Topic: General - Other >> Oct 31, 2024 10:23 AM Thersia BROCKS wrote: Reason for CRM: Patient called in regarding needing a list of all his medication printed out, would like a call when its ready to be picked up from the office

## 2024-11-03 ENCOUNTER — Emergency Department (HOSPITAL_COMMUNITY)
Admission: EM | Admit: 2024-11-03 | Discharge: 2024-11-03 | Discharge status: Against Medical Advice | Source: Home / Self Care

## 2024-11-03 ENCOUNTER — Other Ambulatory Visit: Payer: Self-pay

## 2024-11-03 ENCOUNTER — Telehealth: Payer: Self-pay | Admitting: Internal Medicine

## 2024-11-03 ENCOUNTER — Encounter (HOSPITAL_COMMUNITY): Payer: Self-pay

## 2024-11-03 DIAGNOSIS — R5383 Other fatigue: Secondary | ICD-10-CM | POA: Diagnosis not present

## 2024-11-03 DIAGNOSIS — Z5321 Procedure and treatment not carried out due to patient leaving prior to being seen by health care provider: Secondary | ICD-10-CM | POA: Diagnosis not present

## 2024-11-03 DIAGNOSIS — R631 Polydipsia: Secondary | ICD-10-CM | POA: Diagnosis not present

## 2024-11-03 DIAGNOSIS — R739 Hyperglycemia, unspecified: Secondary | ICD-10-CM | POA: Diagnosis present

## 2024-11-03 LAB — CBG MONITORING, ED: Glucose-Capillary: 281 mg/dL — ABNORMAL HIGH (ref 70–99)

## 2024-11-03 MED ORDER — LACTATED RINGERS IV BOLUS: 1000.0000 mL | Freq: Once | INTRAVENOUS | Status: DC

## 2024-11-03 NOTE — Telephone Encounter (Signed)
 [employer] form Noted Copied Sleeved Original placed in provider box (patel)  Copy placed front desk folder Fax a copy to [phone]  and patient to pick up a copy

## 2024-11-03 NOTE — ED Triage Notes (Signed)
 Pt reports his monitor is not working at home and he was feeling tired and thirsty so he thought his sugar may be elevated.

## 2024-11-03 NOTE — ED Notes (Signed)
 Called pt x3 to move to room. No answer.

## 2024-11-08 ENCOUNTER — Other Ambulatory Visit: Payer: Self-pay | Admitting: Internal Medicine

## 2024-11-08 DIAGNOSIS — F5101 Primary insomnia: Secondary | ICD-10-CM

## 2024-11-11 LAB — HEMOGLOBIN A1C
Est. average glucose Bld gHb Est-mCnc: 263 mg/dL
Hgb A1c MFr Bld: 10.8 % — ABNORMAL HIGH (ref 4.8–5.6)

## 2024-11-11 LAB — CBC WITH DIFFERENTIAL/PLATELET
Basophils Absolute: 0 x10E3/uL (ref 0.0–0.2)
Basos: 1 %
EOS (ABSOLUTE): 0.1 x10E3/uL (ref 0.0–0.4)
Eos: 2 %
Hematocrit: 39.7 % (ref 37.5–51.0)
Hemoglobin: 13.4 g/dL (ref 13.0–17.7)
Immature Grans (Abs): 0 x10E3/uL (ref 0.0–0.1)
Immature Granulocytes: 0 %
Lymphocytes Absolute: 2.4 x10E3/uL (ref 0.7–3.1)
Lymphs: 39 %
MCH: 30.9 pg (ref 26.6–33.0)
MCHC: 33.8 g/dL (ref 31.5–35.7)
MCV: 92 fL (ref 79–97)
Monocytes Absolute: 0.5 x10E3/uL (ref 0.1–0.9)
Monocytes: 8 %
Neutrophils Absolute: 3.2 x10E3/uL (ref 1.4–7.0)
Neutrophils: 50 %
Platelets: 267 x10E3/uL (ref 150–450)
RBC: 4.33 x10E6/uL (ref 4.14–5.80)
RDW: 12 % (ref 11.6–15.4)
WBC: 6.3 x10E3/uL (ref 3.4–10.8)

## 2024-11-11 LAB — CMP14+EGFR
ALT: 42 IU/L (ref 0–44)
AST: 34 IU/L (ref 0–40)
Albumin: 4.4 g/dL (ref 3.8–4.9)
Alkaline Phosphatase: 94 IU/L (ref 47–123)
BUN/Creatinine Ratio: 13 (ref 9–20)
BUN: 15 mg/dL (ref 6–24)
Bilirubin Total: 0.4 mg/dL (ref 0.0–1.2)
CO2: 22 mmol/L (ref 20–29)
Calcium: 9.8 mg/dL (ref 8.7–10.2)
Chloride: 101 mmol/L (ref 96–106)
Creatinine, Ser: 1.17 mg/dL (ref 0.76–1.27)
Globulin, Total: 2.5 g/dL (ref 1.5–4.5)
Glucose: 167 mg/dL — ABNORMAL HIGH (ref 70–99)
Potassium: 4.5 mmol/L (ref 3.5–5.2)
Sodium: 140 mmol/L (ref 134–144)
Total Protein: 6.9 g/dL (ref 6.0–8.5)
eGFR: 73 mL/min/1.73

## 2024-11-11 LAB — LIPID PANEL
Chol/HDL Ratio: 3.4 ratio (ref 0.0–5.0)
Cholesterol, Total: 85 mg/dL — ABNORMAL LOW (ref 100–199)
HDL: 25 mg/dL — ABNORMAL LOW
LDL Chol Calc (NIH): 20 mg/dL (ref 0–99)
Triglycerides: 266 mg/dL — ABNORMAL HIGH (ref 0–149)
VLDL Cholesterol Cal: 40 mg/dL (ref 5–40)

## 2024-11-11 LAB — TSH: TSH: 0.426 u[IU]/mL — ABNORMAL LOW (ref 0.450–4.500)

## 2024-11-11 LAB — PSA: Prostate Specific Ag, Serum: 0.5 ng/mL (ref 0.0–4.0)

## 2024-11-11 LAB — VITAMIN D 25 HYDROXY (VIT D DEFICIENCY, FRACTURES): Vit D, 25-Hydroxy: 11.7 ng/mL — ABNORMAL LOW (ref 30.0–100.0)

## 2024-11-12 ENCOUNTER — Encounter (INDEPENDENT_AMBULATORY_CARE_PROVIDER_SITE_OTHER): Admitting: Internal Medicine

## 2024-11-13 NOTE — Progress Notes (Signed)
 Erroneous encounter - please disregard.

## 2024-11-17 ENCOUNTER — Telehealth: Payer: Self-pay | Admitting: Internal Medicine

## 2024-11-17 NOTE — Telephone Encounter (Signed)
 Patient wanting to be seen sooner than his 3/11 appt said he is wanting to have hip surgery soon. Had an emergency last appt and had to leave. Pt on waitlist Please advise Thanks

## 2024-11-21 ENCOUNTER — Other Ambulatory Visit: Payer: Self-pay | Admitting: Internal Medicine

## 2024-11-21 ENCOUNTER — Telehealth: Payer: Self-pay

## 2024-11-21 DIAGNOSIS — E1140 Type 2 diabetes mellitus with diabetic neuropathy, unspecified: Secondary | ICD-10-CM

## 2024-11-21 MED ORDER — OZEMPIC (0.25 OR 0.5 MG/DOSE) 2 MG/3ML ~~LOC~~ SOPN: 0.5000 mg | PEN_INJECTOR | SUBCUTANEOUS | 2 refills | Status: AC

## 2024-11-21 NOTE — Telephone Encounter (Signed)
 Refill sent

## 2024-11-21 NOTE — Telephone Encounter (Signed)
 Pt needing refill of Ozempic 

## 2024-11-21 NOTE — Addendum Note (Signed)
 Addended by: [name] on: 11/21/2024 10:36 AM   Modules accepted: Orders

## 2024-11-25 ENCOUNTER — Other Ambulatory Visit: Payer: Self-pay

## 2024-11-25 ENCOUNTER — Encounter (HOSPITAL_COMMUNITY): Payer: Self-pay

## 2024-11-25 ENCOUNTER — Emergency Department (HOSPITAL_COMMUNITY)
Admission: EM | Admit: 2024-11-25 | Discharge: 2024-11-26 | Disposition: A | Discharge status: Home / Self Care | Attending: Emergency Medicine | Admitting: Emergency Medicine

## 2024-11-25 DIAGNOSIS — F10920 Alcohol use, unspecified with intoxication, uncomplicated: Secondary | ICD-10-CM

## 2024-11-25 DIAGNOSIS — Y907 Blood alcohol level of 200-239 mg/100 ml: Secondary | ICD-10-CM | POA: Insufficient documentation

## 2024-11-25 DIAGNOSIS — F22 Delusional disorders: Secondary | ICD-10-CM | POA: Diagnosis not present

## 2024-11-25 DIAGNOSIS — Z79899 Other long term (current) drug therapy: Secondary | ICD-10-CM | POA: Diagnosis not present

## 2024-11-25 DIAGNOSIS — Z7984 Long term (current) use of oral hypoglycemic drugs: Secondary | ICD-10-CM | POA: Diagnosis not present

## 2024-11-25 DIAGNOSIS — F10129 Alcohol abuse with intoxication, unspecified: Secondary | ICD-10-CM | POA: Diagnosis present

## 2024-11-25 DIAGNOSIS — R944 Abnormal results of kidney function studies: Secondary | ICD-10-CM | POA: Insufficient documentation

## 2024-11-25 DIAGNOSIS — I10 Essential (primary) hypertension: Secondary | ICD-10-CM | POA: Diagnosis not present

## 2024-11-25 DIAGNOSIS — J449 Chronic obstructive pulmonary disease, unspecified: Secondary | ICD-10-CM | POA: Insufficient documentation

## 2024-11-25 DIAGNOSIS — F14922 Cocaine use, unspecified with intoxication with perceptual disturbance: Secondary | ICD-10-CM

## 2024-11-25 DIAGNOSIS — F14122 Cocaine abuse with intoxication with perceptual disturbance: Secondary | ICD-10-CM | POA: Diagnosis not present

## 2024-11-25 DIAGNOSIS — E1165 Type 2 diabetes mellitus with hyperglycemia: Secondary | ICD-10-CM | POA: Diagnosis not present

## 2024-11-25 LAB — CBC WITH DIFFERENTIAL/PLATELET
Abs Immature Granulocytes: 0.03 K/uL (ref 0.00–0.07)
Basophils Absolute: 0 K/uL (ref 0.0–0.1)
Basophils Relative: 0 %
Eosinophils Absolute: 0.1 K/uL (ref 0.0–0.5)
Eosinophils Relative: 1 %
HCT: 42.2 % (ref 39.0–52.0)
Hemoglobin: 14.4 g/dL (ref 13.0–17.0)
Immature Granulocytes: 0 %
Lymphocytes Relative: 40 %
Lymphs Abs: 3.7 K/uL (ref 0.7–4.0)
MCH: 30.6 pg (ref 26.0–34.0)
MCHC: 34.1 g/dL (ref 30.0–36.0)
MCV: 89.8 fL (ref 80.0–100.0)
Monocytes Absolute: 0.6 K/uL (ref 0.1–1.0)
Monocytes Relative: 6 %
Neutro Abs: 4.9 K/uL (ref 1.7–7.7)
Neutrophils Relative %: 53 %
Platelets: 329 K/uL (ref 150–400)
RBC: 4.7 MIL/uL (ref 4.22–5.81)
RDW: 11.9 % (ref 11.5–15.5)
WBC: 9.3 K/uL (ref 4.0–10.5)
nRBC: 0 % (ref 0.0–0.2)

## 2024-11-25 LAB — URINE DRUG SCREEN
Amphetamines: NEGATIVE
Barbiturates: NEGATIVE
Benzodiazepines: NEGATIVE
Cocaine: POSITIVE — AB
Fentanyl: NEGATIVE
Methadone Scn, Ur: NEGATIVE
Opiates: NEGATIVE
Tetrahydrocannabinol: NEGATIVE

## 2024-11-25 LAB — MAGNESIUM: Magnesium: 2 mg/dL (ref 1.7–2.4)

## 2024-11-25 LAB — COMPREHENSIVE METABOLIC PANEL WITH GFR
ALT: 50 U/L — ABNORMAL HIGH (ref 0–44)
AST: 25 U/L (ref 15–41)
Albumin: 4.6 g/dL (ref 3.5–5.0)
Alkaline Phosphatase: 91 U/L (ref 38–126)
Anion gap: 18 — ABNORMAL HIGH (ref 5–15)
BUN: 14 mg/dL (ref 6–20)
CO2: 21 mmol/L — ABNORMAL LOW (ref 22–32)
Calcium: 9.6 mg/dL (ref 8.9–10.3)
Chloride: 98 mmol/L (ref 98–111)
Creatinine, Ser: 1.41 mg/dL — ABNORMAL HIGH (ref 0.61–1.24)
GFR, Estimated: 58 mL/min — ABNORMAL LOW
Glucose, Bld: 175 mg/dL — ABNORMAL HIGH (ref 70–99)
Potassium: 3.4 mmol/L — ABNORMAL LOW (ref 3.5–5.1)
Sodium: 136 mmol/L (ref 135–145)
Total Bilirubin: 0.3 mg/dL (ref 0.0–1.2)
Total Protein: 7.7 g/dL (ref 6.5–8.1)

## 2024-11-25 LAB — URINALYSIS, ROUTINE W REFLEX MICROSCOPIC
Bilirubin Urine: NEGATIVE
Glucose, UA: NEGATIVE mg/dL
Hgb urine dipstick: NEGATIVE
Ketones, ur: NEGATIVE mg/dL
Leukocytes,Ua: NEGATIVE
Nitrite: NEGATIVE
Protein, ur: NEGATIVE mg/dL
Specific Gravity, Urine: 1.002 — ABNORMAL LOW (ref 1.005–1.030)
pH: 6 (ref 5.0–8.0)

## 2024-11-25 LAB — ETHANOL: Alcohol, Ethyl (B): 222 mg/dL — ABNORMAL HIGH

## 2024-11-25 LAB — CBG MONITORING, ED: Glucose-Capillary: 193 mg/dL — ABNORMAL HIGH (ref 70–99)

## 2024-11-25 MED ORDER — SODIUM CHLORIDE 0.9 % IV BOLUS
1000.0000 mL | Freq: Once | INTRAVENOUS | Status: AC
  Administered 2024-11-25: 1000 mL via INTRAVENOUS

## 2024-11-25 NOTE — ED Triage Notes (Signed)
 Pt POV stating that people are watching him and that he does not know who they are. Pt states he has been seeing little people. Pt having flight of ideas. Pt admits to drinking vodka and beer today and appears to be severely intoxicated. Pt states he is diabetic.

## 2024-11-25 NOTE — ED Provider Notes (Signed)
 " [place] EMERGENCY DEPARTMENT AT [facility] Provider Note   CSN: [id number] Arrival date & time: 11/25/24  8145     Patient presents with: Alcohol Intoxication and Hallucinations   David Knox is a 59 y.o. male.   Pt is a 59 yo male with pmhx significant for DM2, HLD, COPD, GERD and HTN.  Pt said he's drank a lot of alcohol today.  Pt said he heard someone knocking on his door, but his dog did bark which he usually did.  Pt said he tied one of the little people up and put them in the front yard.  He called the police.  The police did not see anyone.  They brought him here to get checked out.  Pt is also telling me that eating grass and leaves will help his blood sugar come down.  Pt said people are following him.  He does not know who they are.         Prior to Admission medications  Medication Sig Start Date End Date Taking? Authorizing Provider  albuterol  (VENTOLIN  HFA) 108 (90 Base) MCG/ACT inhaler Inhale 2 puffs into the lungs every 6 (six) hours as needed for wheezing or shortness of breath. 12/05/22   Tobie Suzzane POUR, MD  atorvastatin  (LIPITOR) 20 MG tablet TAKE 1 TABLET BY MOUTH EVERY DAY 06/23/24   Tobie Suzzane POUR, MD  blood glucose meter kit and supplies Dispense based on patient and insurance preference. Use up to four times daily as directed. (FOR ICD-10 E10.9, E11.9). 08/21/22   [name], MD  Blood Glucose Monitoring Suppl DEVI 1 each by Does not apply route in the morning, at noon, and at bedtime. May substitute to any manufacturer covered by patient's insurance. DX e11.65 10/23/24   Tobie Suzzane POUR, MD  cholestyramine  (QUESTRAN ) 4 GM/DOSE powder Take 0.5 packets (2 g total) by mouth daily. 05/12/24   [name], NP  cyclobenzaprine  (FLEXERIL ) 5 MG tablet Take 1 tablet (5 mg total) by mouth 2 (two) times daily as needed. 02/18/24   Tobie Suzzane POUR, MD  DULoxetine  (CYMBALTA ) 60 MG capsule TAKE 1 CAPSULE BY MOUTH EVERY DAY 06/16/24   Patel, [name],  MD  gabapentin  (NEURONTIN ) 400 MG capsule TAKE 1 CAPSULE BY MOUTH 2 TIMES DAILY. 10/17/24   Tobie Suzzane POUR, MD  glipiZIDE  (GLUCOTROL ) 10 MG tablet TAKE 1 TABLET (10 MG TOTAL) BY MOUTH TWICE A DAY BEFORE A MEAL 08/19/24   Tobie Suzzane POUR, MD  hydrOXYzine  (VISTARIL ) 25 MG capsule TAKE 1 CAPSULE (25 MG TOTAL) BY MOUTH AT BEDTIME AS NEEDED FOR ANXIETY (INSOMNIA). 11/10/24   Tobie Suzzane POUR, MD  ibuprofen  (ADVIL ) 200 MG tablet Take 400 mg by mouth 2 (two) times daily as needed for headache or moderate pain.    [provider]  latanoprost (XALATAN) 0.005 % ophthalmic solution Place 1 drop into both eyes at bedtime.    [provider]  metoprolol  tartrate (LOPRESSOR ) 25 MG tablet TAKE 1 TABLET BY MOUTH (2) TIMES A DAY. 01/07/24   Mallipeddi, Vishnu P, MD  nitroGLYCERIN  (NITROSTAT ) 0.4 MG SL tablet Place 1 tablet (0.4 mg total) under the tongue every 5 (five) minutes as needed for chest pain. 12/05/22   Tobie Suzzane POUR, MD  Olmesartan -amLODIPine -HCTZ 20-5-12.5 MG TABS TAKE 1 TABLET BY MOUTH EVERY DAY 06/16/24   Patel, [name], MD  ondansetron  (ZOFRAN ) 4 MG tablet Take 1 tablet (4 mg total) by mouth every 8 (eight) hours as needed for  nausea or vomiting. 09/24/24   Tobie Suzzane POUR, MD  pantoprazole  (PROTONIX ) 40 MG tablet TAKE 1 TABLET BY MOUTH TWICE A DAY 01/14/24   [name], NP  Semaglutide ,0.25 or 0.5MG /DOS, (OZEMPIC , 0.25 OR 0.5 MG/DOSE,) 2 MG/3ML SOPN Inject 0.5 mg into the skin once a week. 11/21/24   [name], MD  tadalafil  (CIALIS ) 20 MG tablet Take 1 tablet (20 mg total) by mouth daily as needed for erectile dysfunction. 09/24/24   Tobie Suzzane POUR, MD  tamsulosin  (FLOMAX ) 0.4 MG CAPS capsule TAKE 1 CAPSULE BY MOUTH EVERY DAY 05/05/24   Tobie Suzzane POUR, MD    Allergies: Dorethia Canary ]    Review of Systems  Psychiatric/Behavioral:  Positive for hallucinations.   All other systems reviewed and are negative.   Updated Vital Signs BP 99/74   Pulse (!) 108   Temp 98.4  F (36.9 C) (Oral)   Resp 19   SpO2 96%   Physical Exam Vitals and nursing note reviewed.  Constitutional:      Appearance: Normal appearance.  HENT:     Head: Normocephalic and atraumatic.     Right Ear: External ear normal.     Left Ear: External ear normal.     Nose: Nose normal.     Mouth/Throat:     Mouth: Mucous membranes are moist.     Pharynx: Oropharynx is clear.  Eyes:     Extraocular Movements: Extraocular movements intact.     Conjunctiva/sclera: Conjunctivae normal.     Pupils: Pupils are equal, round, and reactive to light.  Cardiovascular:     Rate and Rhythm: Normal rate and regular rhythm.     Pulses: Normal pulses.     Heart sounds: Normal heart sounds.  Pulmonary:     Effort: Pulmonary effort is normal.     Breath sounds: Normal breath sounds.  Abdominal:     General: Abdomen is flat. Bowel sounds are normal.     Palpations: Abdomen is soft.  Musculoskeletal:        General: Normal range of motion.     Cervical back: Normal range of motion and neck supple.  Skin:    General: Skin is warm.     Capillary Refill: Capillary refill takes less than 2 seconds.  Neurological:     General: No focal deficit present.     Mental Status: He is alert and oriented to person, place, and time.  Psychiatric:        Attention and Perception: He perceives auditory and visual hallucinations.        Thought Content: Thought content is paranoid.     (all labs ordered are listed, but only abnormal results are displayed) Labs Reviewed  COMPREHENSIVE METABOLIC PANEL WITH GFR - Abnormal; Notable for the following components:      Result Value   Potassium 3.4 (*)    CO2 21 (*)    Glucose, Bld 175 (*)    Creatinine, Ser 1.41 (*)    ALT 50 (*)    GFR, Estimated 58 (*)    Anion gap 18 (*)    All other components within normal limits  URINALYSIS, ROUTINE W REFLEX MICROSCOPIC - Abnormal; Notable for the following components:   Color, Urine STRAW (*)    Specific Gravity,  Urine 1.002 (*)    All other components within normal limits  URINE DRUG SCREEN - Abnormal; Notable for the following components:   Cocaine POSITIVE (*)    All other components within  normal limits  ETHANOL - Abnormal; Notable for the following components:   Alcohol, Ethyl (B) 222 (*)    All other components within normal limits  CBG MONITORING, ED - Abnormal; Notable for the following components:   Glucose-Capillary 193 (*)    All other components within normal limits  CBC WITH DIFFERENTIAL/PLATELET  MAGNESIUM    EKG: None  Radiology: No results found.   Procedures   Medications Ordered in the ED  sodium chloride  0.9 % bolus 1,000 mL (1,000 mLs Intravenous New Bag/Given 11/25/24 2002)                                    Medical Decision Making Amount and/or Complexity of Data Reviewed Labs: ordered.   This patient presents to the ED for concern of ams, this involves an extensive number of treatment options, and is a complaint that carries with it a high risk of complications and morbidity.  The differential diagnosis includes alcohol abuse, drug use, psych d/o   Co morbidities that complicate the patient evaluation  DM2, HLD, COPD, GERD and HTN   Additional history obtained:  Additional history obtained from epic chart review External records from outside source obtained and reviewed including police   Lab Tests:  I Ordered, and personally interpreted labs.  The pertinent results include:  cbc nl, cmp nl other than glucose elevated at 175, cr elevated at 1.41 (1.17 on 1/5); mg nl; etoh elevated at 222; ua neg; uds + cocaine  Medicines ordered and prescription drug management:  I ordered medication including ivfs  for sx  Reevaluation of the patient after these medicines showed that the patient improved I have reviewed the patients home medicines and have made adjustments as needed   Test Considered:  ct   Critical Interventions:  ivfs   Problem  List / ED Course:  Hallucinations:  likely due to drugs and alcohol.  We will monitor overnight.  If sx persist, he will need psych consult.  If not, he can go home with encouragement to avoid alcohol and cocaine.   Reevaluation:  After the interventions noted above, I reevaluated the patient and found that they have :improved   Social Determinants of Health:  Lives at home   Dispostion:  Pending at shift change     Final diagnoses:  Alcoholic intoxication without complication  Cocaine intoxication with perceptual disturbance [facility])    ED Discharge Orders     None          Dean Clarity, MD 11/25/24 2330  "

## 2024-11-26 ENCOUNTER — Ambulatory Visit: Admitting: Internal Medicine

## 2024-11-26 LAB — CBG MONITORING, ED: Glucose-Capillary: 194 mg/dL — ABNORMAL HIGH (ref 70–99)

## 2024-11-26 MED ORDER — ONDANSETRON HCL 4 MG/2ML IJ SOLN
4.0000 mg | Freq: Once | INTRAMUSCULAR | Status: AC
  Administered 2024-11-26: 4 mg via INTRAVENOUS
  Filled 2024-11-26: qty 2

## 2024-11-26 NOTE — ED Notes (Signed)
 Pt given ice chips for PO challenge

## 2024-11-26 NOTE — ED Notes (Signed)
Pt has tolerated PO challenge 

## 2024-11-26 NOTE — ED Notes (Signed)
 Pt says he feels nauseated, pt given emesis bag and cold cloth to back of neck- Dr Haze made aware.

## 2024-11-26 NOTE — ED Provider Notes (Signed)
 Patient signed out to me to monitor.  Patient seen initially with agitation likely secondary to polysubstance abuse.  Patient awake, alert.  No further agitation, hallucinations.  He is at baseline, completely oriented, has tolerated oral intake.  He is appropriate for discharge.   Haze Lonni PARAS, MD 11/26/24 [phone]

## 2024-11-26 NOTE — ED Notes (Signed)
 ED Provider at bedside.

## 2024-11-26 NOTE — ED Notes (Signed)
 Pt unable to find way home- [facility] called to transport pt home- will be here in 30 minutes, pt A & O x 4 and ambulates with steady gait. PT sent to [facility] to await transport

## 2024-12-03 ENCOUNTER — Encounter: Payer: Self-pay | Admitting: Internal Medicine

## 2024-12-03 ENCOUNTER — Telehealth: Payer: Self-pay

## 2024-12-03 ENCOUNTER — Ambulatory Visit: Attending: Internal Medicine | Admitting: Internal Medicine

## 2024-12-03 VITALS — BP 122/82 | HR 94 | Ht 71.0 in | Wt 194.6 lb

## 2024-12-03 DIAGNOSIS — I739 Peripheral vascular disease, unspecified: Secondary | ICD-10-CM

## 2024-12-03 DIAGNOSIS — R0789 Other chest pain: Secondary | ICD-10-CM | POA: Diagnosis not present

## 2024-12-03 DIAGNOSIS — I10 Essential (primary) hypertension: Secondary | ICD-10-CM | POA: Diagnosis not present

## 2024-12-03 NOTE — Patient Instructions (Addendum)
 Medication Instructions:  Your physician has recommended you make the following change in your medication:  Stop taking Metoprolol   Continue taking all other medications as prescribed   Labwork: None  Testing/Procedures: None  Follow-Up: Your physician recommends that you schedule a follow-up appointment in: Follow up as needed  Any Other Special Instructions Will Be Listed Below (If Applicable). Thank you for choosing [facility]!     If you need a refill on your cardiac medications before your next appointment, please call your pharmacy.

## 2024-12-03 NOTE — Progress Notes (Signed)
 "   Cardiology Office Note  Date: 12/03/2024   ID: David Knox, DOB July 16, 1966, MRN [id number]  PCP:  Tobie Suzzane POUR, MD  Cardiologist:  Eller Sweis P Naszir Cott, MD Electrophysiologist:  None   Reason for Office Visit: Follow-up of chest pain   History of Present Illness: David Knox is a 59 y.o. male known to have HTN, DM 2, HLD, nicotine  abuse is here for follow-up visit.  Patient previously reported exertional chest pain for which NM stress test was obtained that showed no evidence of ischemia.  Echo was also normal.  Due to persistent symptoms, CT cardiac was obtained that showed coronary calcium  score 0 and no evidence of CAD.  Due to lower expected occasion, he underwent ABIs that were within normal limits but the right posterior tibial waveform was abnormal suggesting localized below the knee disease.  Patient is here today for follow-up visit.  He had 2 ER visits recently.  Had some hallucinations and elevated blood glucose levels.  He drinks alcohol few times per week.  Smoke cigarettes.  Currently smoking half a pack a day.  Previously was smoking 1-1/2 pack/day.  He reported having chest tightness with exertion that resolves when he moves his left arm up and down.  Then he resumes walking with no recurrence of chest tightness. Have other symptoms of DOE.  No dizziness, syncope, leg swelling.  No palpitations.  He reported having worsening erectile dysfunction after starting metoprolol  that was discontinued in 12/2023 when Brittany Strader evaluated him.    Past Medical History:  Diagnosis Date   Acid reflux    Diabetes mellitus without complication (HCC)    HLD (hyperlipidemia)    HTN (hypertension)     Past Surgical History:  Procedure Laterality Date   BIOPSY  01/18/2023   Procedure: BIOPSY;  Surgeon: Cindie Carlin POUR, DO;  [facility]: [facility];  Service: Endoscopy;;   CHOLECYSTECTOMY N/A 07/23/2023   Procedure: LAPAROSCOPIC CHOLECYSTECTOMY;  Surgeon: Kallie Manuelita BROCKS, MD;  Location: [facility];  Service: General;  Laterality: N/A;   COLONOSCOPY WITH PROPOFOL  N/A 07/27/2022   Procedure: COLONOSCOPY WITH PROPOFOL ;  Surgeon: Cindie Carlin POUR, DO;  Location: [facility];  Service: Endoscopy;  Laterality: N/A;  9:00 am  ASA 2   ESOPHAGOGASTRODUODENOSCOPY  2002   RMR: ulcerative reflux esophagitis, moderate sized hiatal hernia   ESOPHAGOGASTRODUODENOSCOPY (EGD) WITH PROPOFOL  N/A 01/18/2023   Procedure: ESOPHAGOGASTRODUODENOSCOPY (EGD) WITH PROPOFOL ;  Surgeon: Cindie Carlin POUR, DO;  Location: [facility];  Service: Endoscopy;  Laterality: N/A;  1:45 pm, asa 2   HIP SURGERY Bilateral    as a child   POLYPECTOMY  07/27/2022   Procedure: POLYPECTOMY;  Surgeon: Cindie Carlin POUR, DO;  Location: [facility];  Service: Endoscopy;;   TOOTH EXTRACTION     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   Procedure: TOTAL HIP ARTHROPLASTY;  Surgeon: Margrette Taft BRAVO, MD;  Location: [facility];  Service: Orthopedics;  Laterality: Right;   WRIST SURGERY      Current Outpatient Medications  Medication Sig Dispense Refill   albuterol  (VENTOLIN  HFA) 108 (90 Base) MCG/ACT inhaler Inhale 2 puffs into the lungs every 6 (six) hours as needed for wheezing or shortness of breath. 8 g 3   atorvastatin  (LIPITOR) 20 MG tablet TAKE 1 TABLET BY MOUTH EVERY DAY 90 tablet 1   blood glucose meter kit and supplies Dispense based on patient and insurance preference. Use up to four times daily as directed. (FOR ICD-10 E10.9,  E11.9). 1 each 0   Blood Glucose Monitoring Suppl DEVI 1 each by Does not apply route in the morning, at noon, and at bedtime. May substitute to any manufacturer covered by patient's insurance. DX e11.65 1 each 0   cholestyramine  (QUESTRAN ) 4 GM/DOSE powder Take 0.5 packets (2 g total) by mouth daily. 348.6 g 1   cyclobenzaprine  (FLEXERIL ) 5 MG tablet Take 1 tablet (5 mg total) by mouth 2 (two) times daily as needed. 30 tablet 1   DULoxetine  (CYMBALTA ) 60 MG capsule  TAKE 1 CAPSULE BY MOUTH EVERY DAY 90 capsule 2   gabapentin  (NEURONTIN ) 400 MG capsule TAKE 1 CAPSULE BY MOUTH 2 TIMES DAILY. 60 capsule 5   glipiZIDE  (GLUCOTROL ) 10 MG tablet TAKE 1 TABLET (10 MG TOTAL) BY MOUTH TWICE A DAY BEFORE A MEAL 180 tablet 1   hydrOXYzine  (VISTARIL ) 25 MG capsule TAKE 1 CAPSULE (25 MG TOTAL) BY MOUTH AT BEDTIME AS NEEDED FOR ANXIETY (INSOMNIA). 90 capsule 1   ibuprofen  (ADVIL ) 200 MG tablet Take 400 mg by mouth 2 (two) times daily as needed for headache or moderate pain.     latanoprost (XALATAN) 0.005 % ophthalmic solution Place 1 drop into both eyes at bedtime.     metoprolol  tartrate (LOPRESSOR ) 25 MG tablet TAKE 1 TABLET BY MOUTH (2) TIMES A DAY. 180 tablet 2   nitroGLYCERIN  (NITROSTAT ) 0.4 MG SL tablet Place 1 tablet (0.4 mg total) under the tongue every 5 (five) minutes as needed for chest pain. 10 tablet 1   Olmesartan -amLODIPine -HCTZ 20-5-12.5 MG TABS TAKE 1 TABLET BY MOUTH EVERY DAY 90 tablet 1   ondansetron  (ZOFRAN ) 4 MG tablet Take 1 tablet (4 mg total) by mouth every 8 (eight) hours as needed for nausea or vomiting. 20 tablet 0   pantoprazole  (PROTONIX ) 40 MG tablet TAKE 1 TABLET BY MOUTH TWICE A DAY 60 tablet 11   Semaglutide ,0.25 or 0.5MG /DOS, (OZEMPIC , 0.25 OR 0.5 MG/DOSE,) 2 MG/3ML SOPN Inject 0.5 mg into the skin once a week. 3 mL 2   tadalafil  (CIALIS ) 20 MG tablet Take 1 tablet (20 mg total) by mouth daily as needed for erectile dysfunction. 30 tablet 1   tamsulosin  (FLOMAX ) 0.4 MG CAPS capsule TAKE 1 CAPSULE BY MOUTH EVERY DAY 90 capsule 1   No current facility-administered medications for this visit.   Allergies:  Asa [aspirin ]   Social History: The patient  reports that he has been smoking cigarettes. He has never used smokeless tobacco. He reports current alcohol use. He reports that he does not use drugs.   Family History: The patient's family history includes Diabetes in his father and mother; Heart disease in his father and mother.   ROS:   Please see the history of present illness. Otherwise, complete review of systems is positive for none.  All other systems are reviewed and negative.   Physical Exam: VS:  BP 122/82   Pulse 94   Ht 5' 11 (1.803 m)   Wt 194 lb 9.6 oz (88.3 kg)   SpO2 96%   BMI 27.14 kg/m , BMI Body mass index is 27.14 kg/m.  Wt Readings from Last 3 Encounters:  12/03/24 194 lb 9.6 oz (88.3 kg)  11/03/24 194 lb (88 kg)  10/15/24 194 lb 9.6 oz (88.3 kg)    General: Patient appears comfortable at rest. HEENT: Conjunctiva and lids normal, oropharynx clear with moist mucosa. Neck: Supple, no elevated JVP or carotid bruits, no thyromegaly. Lungs: Clear to auscultation, nonlabored breathing at rest. Cardiac:  Regular rate and rhythm, no S3 or significant systolic murmur, no pericardial rub. Abdomen: Soft, nontender, no hepatomegaly, bowel sounds present, no guarding or rebound. Extremities: No pitting edema, distal pulses 2+. Skin: Warm and dry. Musculoskeletal: No kyphosis. Neuropsychiatric: Alert and oriented x3, affect grossly appropriate.  ECG: NSR  Recent Labwork: 11/10/2024: TSH 0.426 11/25/2024: ALT 50; AST 25; BUN 14; Creatinine, Ser 1.41; Hemoglobin 14.4; Magnesium 2.0; Platelets 329; Potassium 3.4; Sodium 136     Component Value Date/Time   CHOL 85 (L) 11/10/2024 0929   TRIG 266 (H) 11/10/2024 0929   HDL 25 (L) 11/10/2024 0929   CHOLHDL 3.4 11/10/2024 0929   LDLCALC 20 11/10/2024 0929    Assessment and Plan:  Noncardiac chest pain - Patient reported exertional chest tightness for which she underwent NM stress test in 2024 that showed no evidence of ischemia.  Echo normal.  Due to persistent symptoms, he underwent CT cardiac that showed coronary calcium  score of 0 and no CAD.  He reported chest tightness with exertion that improves when he moves his arm up and down.  Then, he resumes walking with no recurrence of chest tightness.  He has musculoskeletal chest pain.  I discussed with him the  symptoms of CAD and MI.  He will return back to the clinic for further evaluation if he has true angina.  ER precautions for chest pain provided.  # Bilateral lower extremity claudication (leg fatigue/tightness while walking), rule out PAD -R ABI 1.26 and L ABI 1.24.  The right posterior tibial waveform is abnormal suggesting localized below the knee disease.  Obtain ultrasound arterial Doppler lower EXTR.  Smoking cessation.  # HTN, controlled - Continue olmesartan -amlodipine -HCTZ 20-5-12 0.5 mg once daily.  # HLD, at goal - Continue atorvastatin  20 mg nightly, goal LDL less than 100.  LDL 20 in January 2026.  # Nicotine  abuse - Currently smoking 10 cigarettes/day.  Previously used to smoke 30 cigarettes/day.  Counseling provided.  Congratulated for cutting back significantly.  30 minutes spent in reviewing prior medical records, records, more than 3 labs, discussion and documentation.   Medication Adjustments/Labs and Tests Ordered: Current medicines are reviewed at length with the patient today.  Concerns regarding medicines are outlined above.   Tests Ordered: Orders Placed This Encounter  Procedures   EKG 12-Lead     Medication Changes: No orders of the defined types were placed in this encounter.     Disposition:  Follow up PRN  Signed, Johngabriel Verde Arleta Maywood, MD, 12/03/2024 9:22 AM    [facility] at [facility] [address] [place], [place] [address] "

## 2024-12-03 NOTE — Telephone Encounter (Signed)
 Per Dr. Mallipeddi after patient's appointment: Let's get [facility] arterial doppler lower extremities Diagnosis is PAD He has abnormal waveforms on his ABI results from over an year ago  Left message for patient to call the office. Placed order and sent to scheduling.

## 2024-12-05 ENCOUNTER — Other Ambulatory Visit: Payer: Self-pay | Admitting: Internal Medicine

## 2024-12-05 DIAGNOSIS — N529 Male erectile dysfunction, unspecified: Secondary | ICD-10-CM

## 2024-12-09 ENCOUNTER — Ambulatory Visit

## 2024-12-10 ENCOUNTER — Ambulatory Visit: Admitting: Orthopedic Surgery

## 2024-12-10 ENCOUNTER — Encounter: Payer: Self-pay | Admitting: Orthopedic Surgery

## 2024-12-10 VITALS — BP 122/82 | Ht 71.0 in | Wt 194.0 lb

## 2024-12-10 NOTE — Progress Notes (Unsigned)
 SABRA

## 2024-12-11 NOTE — Progress Notes (Signed)
 No visit

## 2024-12-17 ENCOUNTER — Ambulatory Visit

## 2024-12-22 ENCOUNTER — Ambulatory Visit: Admitting: Orthopedic Surgery

## 2024-12-22 ENCOUNTER — Ambulatory Visit: Admitting: Gastroenterology

## 2025-01-14 ENCOUNTER — Ambulatory Visit: Payer: Self-pay | Admitting: Internal Medicine
